# Patient Record
Sex: Female | Born: 1935
Health system: Southern US, Community
[De-identification: ages and names within clinical notes are randomized; demographics above are authoritative.]

## PROBLEM LIST (undated history)

## (undated) DIAGNOSIS — I2510 Atherosclerotic heart disease of native coronary artery without angina pectoris: Secondary | ICD-10-CM

## (undated) DIAGNOSIS — H189 Unspecified disorder of cornea: Secondary | ICD-10-CM

## (undated) DIAGNOSIS — I4819 Other persistent atrial fibrillation: Secondary | ICD-10-CM

## (undated) DIAGNOSIS — W19XXXA Unspecified fall, initial encounter: Secondary | ICD-10-CM

## (undated) DIAGNOSIS — I5032 Chronic diastolic (congestive) heart failure: Secondary | ICD-10-CM

## (undated) DIAGNOSIS — Z860100 Personal history of colon polyps, unspecified: Secondary | ICD-10-CM

## (undated) DIAGNOSIS — I509 Heart failure, unspecified: Secondary | ICD-10-CM

## (undated) DIAGNOSIS — Z8619 Personal history of other infectious and parasitic diseases: Secondary | ICD-10-CM

## (undated) DIAGNOSIS — I10 Essential (primary) hypertension: Secondary | ICD-10-CM

## (undated) DIAGNOSIS — E039 Hypothyroidism, unspecified: Secondary | ICD-10-CM

## (undated) DIAGNOSIS — K219 Gastro-esophageal reflux disease without esophagitis: Secondary | ICD-10-CM

## (undated) DIAGNOSIS — I4891 Unspecified atrial fibrillation: Secondary | ICD-10-CM

## (undated) DIAGNOSIS — I517 Cardiomegaly: Secondary | ICD-10-CM

## (undated) DIAGNOSIS — I499 Cardiac arrhythmia, unspecified: Secondary | ICD-10-CM

## (undated) DIAGNOSIS — E785 Hyperlipidemia, unspecified: Secondary | ICD-10-CM

## (undated) HISTORY — DX: Cardiomegaly: I51.7

## (undated) HISTORY — DX: Essential (primary) hypertension: I10

## (undated) HISTORY — DX: Atherosclerotic heart disease of native coronary artery without angina pectoris: I25.10

## (undated) HISTORY — DX: Unspecified disorder of cornea: H18.9

## (undated) HISTORY — PX: OTHER SURGICAL HISTORY: SHX169

## (undated) HISTORY — DX: Other persistent atrial fibrillation: I48.19

## (undated) HISTORY — DX: Personal history of colonic polyps: Z86.010

## (undated) HISTORY — DX: Hypothyroidism, unspecified: E03.9

## (undated) HISTORY — DX: Chronic diastolic (congestive) heart failure: I50.32

## (undated) HISTORY — DX: Cardiac arrhythmia, unspecified: I49.9

## (undated) HISTORY — DX: Personal history of colon polyps, unspecified: Z86.0100

## (undated) HISTORY — DX: Hyperlipidemia, unspecified: E78.5

## (undated) HISTORY — DX: Personal history of other infectious and parasitic diseases: Z86.19

## (undated) HISTORY — DX: Unspecified fall, initial encounter: W19.XXXA

## (undated) HISTORY — DX: Gastro-esophageal reflux disease without esophagitis: K21.9

## (undated) HISTORY — DX: Heart failure, unspecified: I50.9

## (undated) HISTORY — DX: Unspecified atrial fibrillation: I48.91

---

## 2009-12-13 HISTORY — PX: CARDIOVASCULAR STRESS TEST: SHX262

## 2011-12-16 ENCOUNTER — Ambulatory Visit: Payer: Self-pay | Admitting: Family Medicine

## 2012-01-06 ENCOUNTER — Other Ambulatory Visit: Payer: Self-pay | Admitting: Family Medicine

## 2012-01-06 ENCOUNTER — Encounter: Payer: Self-pay | Admitting: Family Medicine

## 2012-01-06 ENCOUNTER — Ambulatory Visit (INDEPENDENT_AMBULATORY_CARE_PROVIDER_SITE_OTHER): Payer: Medicare Other | Admitting: Family Medicine

## 2012-01-06 VITALS — BP 132/70 | HR 64 | Temp 97.6°F | Ht 62.0 in | Wt 229.0 lb

## 2012-01-06 DIAGNOSIS — I10 Essential (primary) hypertension: Secondary | ICD-10-CM

## 2012-01-06 DIAGNOSIS — E782 Mixed hyperlipidemia: Secondary | ICD-10-CM | POA: Insufficient documentation

## 2012-01-06 DIAGNOSIS — I4891 Unspecified atrial fibrillation: Secondary | ICD-10-CM

## 2012-01-06 DIAGNOSIS — Z5181 Encounter for therapeutic drug level monitoring: Secondary | ICD-10-CM

## 2012-01-06 DIAGNOSIS — E785 Hyperlipidemia, unspecified: Secondary | ICD-10-CM

## 2012-01-06 DIAGNOSIS — E669 Obesity, unspecified: Secondary | ICD-10-CM

## 2012-01-06 DIAGNOSIS — I482 Chronic atrial fibrillation, unspecified: Secondary | ICD-10-CM | POA: Insufficient documentation

## 2012-01-06 DIAGNOSIS — Z7901 Long term (current) use of anticoagulants: Secondary | ICD-10-CM

## 2012-01-06 LAB — POCT INR: INR: 1.5

## 2012-01-06 MED ORDER — METOPROLOL TARTRATE 50 MG PO TABS: 25.0000 mg | ORAL_TABLET | Freq: Two times a day (BID) | ORAL | Status: DC

## 2012-01-06 MED ORDER — LEVOTHYROXINE SODIUM 125 MCG PO TABS: 125.0000 ug | ORAL_TABLET | Freq: Every day | ORAL | Status: DC

## 2012-01-06 MED ORDER — LOSARTAN POTASSIUM 100 MG PO TABS: 100.0000 mg | ORAL_TABLET | Freq: Every day | ORAL | Status: DC

## 2012-01-06 MED ORDER — WARFARIN SODIUM 3 MG PO TABS: ORAL_TABLET | ORAL | Status: DC

## 2012-01-06 MED ORDER — POTASSIUM CHLORIDE ER 10 MEQ PO TBCR: 10.0000 meq | EXTENDED_RELEASE_TABLET | Freq: Every day | ORAL | Status: DC

## 2012-01-06 NOTE — Progress Notes (Signed)
AF, records requested, due for INR.  Needs to est care.  No CP, not sob.  Compliant with meds.    Hypertension:    Using medication without problems or lightheadedness: yes Chest pain with exertion:no Edema:no Short of breath:no Possibly intolerant of lisinopril, but patient can't elaborate fully.  Requesting records.    H/o colonoscopy.   She declines mammogram.  "Even if the found something, I wouldn't do anything about it."  Meds, vitals, and allergies reviewed.   PMH and SH reviewed  ROS: See HPI.  Otherwise negative.    GEN: nad, alert and oriented, overweight HEENT: mucous membranes moist NECK: supple w/o LA CV: rrr. PULM: ctab, no inc wob ABD: soft, +bs EXT: no edema SKIN: no acute rash

## 2012-01-06 NOTE — Assessment & Plan Note (Signed)
Continue as is, requesting records.   Statin intolerant per patient report.

## 2012-01-06 NOTE — Assessment & Plan Note (Signed)
Goal for gradual weight loss.

## 2012-01-06 NOTE — Assessment & Plan Note (Signed)
Continue as is, requesting records.

## 2012-01-06 NOTE — Assessment & Plan Note (Signed)
Continue as is, requesting records.   Refer to cards.  Hope to get records before cards eval. No CP.

## 2012-01-06 NOTE — Patient Instructions (Addendum)
Don't change your meds for now.   Go to the lab on the way out.  See Shirlee Limerick about your referral before you leave today. Take care.   OV this summer.

## 2012-01-17 ENCOUNTER — Encounter: Payer: Self-pay | Admitting: Family Medicine

## 2012-01-17 DIAGNOSIS — I517 Cardiomegaly: Secondary | ICD-10-CM | POA: Insufficient documentation

## 2012-01-20 ENCOUNTER — Ambulatory Visit (INDEPENDENT_AMBULATORY_CARE_PROVIDER_SITE_OTHER): Payer: Medicare Other | Admitting: Family Medicine

## 2012-01-20 DIAGNOSIS — Z7901 Long term (current) use of anticoagulants: Secondary | ICD-10-CM

## 2012-01-20 DIAGNOSIS — Z5181 Encounter for therapeutic drug level monitoring: Secondary | ICD-10-CM

## 2012-01-20 DIAGNOSIS — I4891 Unspecified atrial fibrillation: Secondary | ICD-10-CM

## 2012-01-20 NOTE — Patient Instructions (Signed)
7.5 mg daily except 10.5 mg 2 days a week ( T/TH?) check in 2 weeks

## 2012-01-24 ENCOUNTER — Encounter: Payer: Self-pay | Admitting: Family Medicine

## 2012-02-04 ENCOUNTER — Ambulatory Visit: Payer: Medicare Other

## 2012-02-07 ENCOUNTER — Ambulatory Visit (INDEPENDENT_AMBULATORY_CARE_PROVIDER_SITE_OTHER): Payer: Medicare Other | Admitting: Family Medicine

## 2012-02-07 DIAGNOSIS — I4891 Unspecified atrial fibrillation: Secondary | ICD-10-CM

## 2012-02-07 DIAGNOSIS — Z5181 Encounter for therapeutic drug level monitoring: Secondary | ICD-10-CM

## 2012-02-07 DIAGNOSIS — Z7901 Long term (current) use of anticoagulants: Secondary | ICD-10-CM

## 2012-02-07 LAB — POCT INR: INR: 4.1

## 2012-02-07 NOTE — Patient Instructions (Signed)
Hold today and tomorrow then resume 7.5 mg daily 10.5 mg Sun Wed, check FRIDAY

## 2012-02-10 ENCOUNTER — Ambulatory Visit: Payer: Medicare Other | Admitting: Cardiovascular Disease

## 2012-02-11 ENCOUNTER — Ambulatory Visit (INDEPENDENT_AMBULATORY_CARE_PROVIDER_SITE_OTHER): Payer: Medicare Other | Admitting: Family Medicine

## 2012-02-11 DIAGNOSIS — I4891 Unspecified atrial fibrillation: Secondary | ICD-10-CM

## 2012-02-11 DIAGNOSIS — Z7901 Long term (current) use of anticoagulants: Secondary | ICD-10-CM

## 2012-02-11 DIAGNOSIS — Z5181 Encounter for therapeutic drug level monitoring: Secondary | ICD-10-CM

## 2012-02-11 LAB — POCT INR: INR: 1.6

## 2012-02-18 ENCOUNTER — Encounter: Payer: Self-pay | Admitting: *Deleted

## 2012-02-18 ENCOUNTER — Ambulatory Visit: Payer: Medicare Other

## 2012-02-21 ENCOUNTER — Ambulatory Visit: Payer: Medicare Other | Admitting: Cardiovascular Disease

## 2012-02-24 ENCOUNTER — Ambulatory Visit: Payer: Medicare Other

## 2012-02-25 ENCOUNTER — Ambulatory Visit (INDEPENDENT_AMBULATORY_CARE_PROVIDER_SITE_OTHER): Payer: Medicare Other | Admitting: Family Medicine

## 2012-02-25 DIAGNOSIS — I4891 Unspecified atrial fibrillation: Secondary | ICD-10-CM

## 2012-02-25 DIAGNOSIS — Z7901 Long term (current) use of anticoagulants: Secondary | ICD-10-CM

## 2012-02-25 DIAGNOSIS — Z5181 Encounter for therapeutic drug level monitoring: Secondary | ICD-10-CM

## 2012-02-25 LAB — POCT INR: INR: 3

## 2012-02-25 NOTE — Patient Instructions (Signed)
Start 7.5 mg daily except 9 mg 2 days, recheck 10 days

## 2012-03-06 ENCOUNTER — Ambulatory Visit (INDEPENDENT_AMBULATORY_CARE_PROVIDER_SITE_OTHER): Payer: Medicare Other | Admitting: Family Medicine

## 2012-03-06 DIAGNOSIS — I4891 Unspecified atrial fibrillation: Secondary | ICD-10-CM

## 2012-03-06 DIAGNOSIS — Z7901 Long term (current) use of anticoagulants: Secondary | ICD-10-CM

## 2012-03-06 DIAGNOSIS — Z5181 Encounter for therapeutic drug level monitoring: Secondary | ICD-10-CM

## 2012-03-06 NOTE — Patient Instructions (Signed)
Continue current dose, check in 4 weeks  

## 2012-03-24 ENCOUNTER — Ambulatory Visit (INDEPENDENT_AMBULATORY_CARE_PROVIDER_SITE_OTHER): Payer: Medicare Other | Admitting: Cardiovascular Disease

## 2012-03-24 ENCOUNTER — Encounter: Payer: Self-pay | Admitting: Cardiovascular Disease

## 2012-03-24 VITALS — BP 142/78 | HR 65 | Resp 16 | Ht 62.0 in | Wt 232.0 lb

## 2012-03-24 DIAGNOSIS — I10 Essential (primary) hypertension: Secondary | ICD-10-CM

## 2012-03-24 DIAGNOSIS — I517 Cardiomegaly: Secondary | ICD-10-CM

## 2012-03-24 DIAGNOSIS — I4891 Unspecified atrial fibrillation: Secondary | ICD-10-CM

## 2012-03-24 DIAGNOSIS — E785 Hyperlipidemia, unspecified: Secondary | ICD-10-CM

## 2012-03-24 DIAGNOSIS — E669 Obesity, unspecified: Secondary | ICD-10-CM

## 2012-03-24 NOTE — Assessment & Plan Note (Signed)
Mild LVH on echocardiogram from 2007. No symptoms of shortness of breath or fluid retention.

## 2012-03-24 NOTE — Assessment & Plan Note (Signed)
We have encouraged continued exercise, careful diet management in an effort to lose weight. 

## 2012-03-24 NOTE — Progress Notes (Signed)
Patient ID: Julie Phillips, female    DOB: 12/24/1931, 77 y.o.   MRN: [id number]  HPI Comments: Julie Phillips  is a pleasant 78 year old woman with remote history of atrial fibrillation in 2007, low potassium by her report, who has been on chronic warfarin, history of hypertension who presents by referral establish care in our office .  She reports that overall she has been feeling well. She has not had any recent episodes of atrial fibrillation for at least one year. When she has arrhythmia, she is symptomatic. She has rare episodes of irregular heartbeat that does not last very long, again not for a prolonged period of time. The main episode was 2007 and she converted on her own by  her report.   At that time she had an echocardiogram that showed mild MR and TR, normal LV systolic function  Stress test was normal showing no ischemia with normal ejection fraction . Carotid ultrasound showed minimal chronic plaquing .  No recent blood work available . She is relatively active, walks with a cane and walker for balance. No significant chest pain or shortness of breath with exertion .she did have a fall recently when she fell over her dog. She did not hurt herself .  EKG shows normal sinus rhythm with rate 65 beats per minute, no significant ST or T wave changes    Outpatient Encounter Prescriptions as of 03/24/2012  Medication Sig Dispense Refill  . aspirin 81 MG tablet Take 81 mg by mouth daily.      . Calcium Carbonate-Vitamin D (CALTRATE 600+D PO) Take by mouth daily.      . Cobalamine Combinations (VITAMIN B12-FOLIC ACID) 500-400 MCG TABS Take 1 tablet by mouth daily.      Marland Kitchen desoximetasone (TOPICORT) 0.25 % cream Apply topically 2 (two) times daily.      Marland Kitchen levothyroxine (SYNTHROID, LEVOTHROID) 125 MCG tablet Take 1 tablet (125 mcg total) by mouth daily.  30 tablet  0  . losartan (COZAAR) 100 MG tablet Take 1 tablet (100 mg total) by mouth daily.  30 tablet  0  . Lutein-Zeaxanthin 15-0.7  MG CAPS Take 2 capsules by mouth daily.      . metoprolol (LOPRESSOR) 50 MG tablet Take 0.5 tablets (25 mg total) by mouth 2 (two) times daily.  30 tablet  0  . potassium chloride (K-DUR) 10 MEQ tablet Take 1 tablet (10 mEq total) by mouth daily.  30 tablet  0  . ranitidine (ZANTAC) 150 MG capsule Take 150 mg by mouth 2 (two) times daily.      Marland Kitchen warfarin (COUMADIN) 3 MG tablet 2.5 tabs x5 days a week and 3.5 tabs x2 days a week or as directed.  100 tablet  0   Review of Systems  Constitutional: Negative.   HENT: Negative.   Eyes: Negative.   Respiratory: Negative.   Cardiovascular: Negative.   Gastrointestinal: Negative.   Musculoskeletal: Positive for gait problem.  Skin: Negative.   Neurological: Negative.   Hematological: Negative.   Psychiatric/Behavioral: Negative.   All other systems reviewed and are negative.    BP 142/78  Pulse 65  Resp 16  Ht 5\' 2"  (1.575 m)  Wt 232 lb (105.235 kg)  BMI 42.43 kg/m2  Physical Exam  Nursing note and vitals reviewed. Constitutional: She is oriented to person, place, and time. She appears well-developed and well-nourished.       Obese  HENT:  Head: Normocephalic.  Nose: Nose normal.  Mouth/Throat: Oropharynx is  clear and moist.  Eyes: Conjunctivae are normal. Pupils are equal, round, and reactive to light.  Neck: Normal range of motion. Neck supple. No JVD present.  Cardiovascular: Normal rate, regular rhythm, S1 normal, S2 normal, normal heart sounds and intact distal pulses.  Exam reveals no gallop and no friction rub.   No murmur heard. Pulmonary/Chest: Effort normal and breath sounds normal. No respiratory distress. She has no wheezes. She has no rales. She exhibits no tenderness.  Abdominal: Soft. Bowel sounds are normal. She exhibits no distension. There is no tenderness.  Musculoskeletal: Normal range of motion. She exhibits no edema and no tenderness.  Lymphadenopathy:    She has no cervical adenopathy.  Neurological: She is  alert and oriented to person, place, and time. Coordination normal.  Skin: Skin is warm and dry. Rash noted. No erythema.       Erythematous rash of the right lower extremity below the knee  Psychiatric: She has a normal mood and affect. Her behavior is normal. Judgment and thought content normal.         Assessment and Plan

## 2012-03-24 NOTE — Assessment & Plan Note (Addendum)
History of hyperlipidemia. No labs available to review at this time. Minimal carotid plaquing in 2007.

## 2012-03-24 NOTE — Assessment & Plan Note (Signed)
Blood pressure by her report has been well controlled at home. We have not made any medication changes today and we will monitor her numbers.

## 2012-03-24 NOTE — Assessment & Plan Note (Signed)
Remote episode of atrial fibrillation in 2007. Nothing documented since that time and she has been relatively asymptomatic. We did discuss holding her warfarin. This would likely be okay. She is high risk given recent falls. We did discuss cutting her warfarin in half on a daily basis with no INR check as well. She will continue on the warfarin for now and discuss this with Dr. Para March.

## 2012-03-24 NOTE — Patient Instructions (Signed)
You are doing well. No medication changes were made.  Please call us if you have new issues that need to be addressed before your next appt.  Your physician wants you to follow-up in: 12 months.  You will receive a reminder letter in the mail two months in advance. If you don't receive a letter, please call our office to schedule the follow-up appointment.  For your leg, keep it cool Try Sarna cream (generic) or calamine  for itching

## 2012-04-03 ENCOUNTER — Ambulatory Visit (INDEPENDENT_AMBULATORY_CARE_PROVIDER_SITE_OTHER): Payer: Medicare Other | Admitting: Family Medicine

## 2012-04-03 DIAGNOSIS — Z5181 Encounter for therapeutic drug level monitoring: Secondary | ICD-10-CM

## 2012-04-03 DIAGNOSIS — Z7901 Long term (current) use of anticoagulants: Secondary | ICD-10-CM

## 2012-04-03 DIAGNOSIS — I4891 Unspecified atrial fibrillation: Secondary | ICD-10-CM

## 2012-04-03 LAB — POCT INR: INR: 1.5

## 2012-04-03 NOTE — Patient Instructions (Signed)
(  take 9 mg next two days) sees Dr Vladimir Crofts will recheck then

## 2012-04-04 ENCOUNTER — Ambulatory Visit: Payer: Medicare Other | Admitting: Family Medicine

## 2012-04-04 ENCOUNTER — Ambulatory Visit: Payer: Medicare Other

## 2012-04-11 ENCOUNTER — Encounter: Payer: Self-pay | Admitting: Family Medicine

## 2012-04-11 ENCOUNTER — Ambulatory Visit (INDEPENDENT_AMBULATORY_CARE_PROVIDER_SITE_OTHER): Payer: Medicare Other | Admitting: Family Medicine

## 2012-04-11 VITALS — BP 152/68 | HR 63 | Temp 97.9°F | Wt 235.0 lb

## 2012-04-11 DIAGNOSIS — R21 Rash and other nonspecific skin eruption: Secondary | ICD-10-CM | POA: Insufficient documentation

## 2012-04-11 DIAGNOSIS — E785 Hyperlipidemia, unspecified: Secondary | ICD-10-CM

## 2012-04-11 DIAGNOSIS — I4891 Unspecified atrial fibrillation: Secondary | ICD-10-CM

## 2012-04-11 DIAGNOSIS — Z5181 Encounter for therapeutic drug level monitoring: Secondary | ICD-10-CM

## 2012-04-11 DIAGNOSIS — Z7901 Long term (current) use of anticoagulants: Secondary | ICD-10-CM

## 2012-04-11 DIAGNOSIS — E669 Obesity, unspecified: Secondary | ICD-10-CM

## 2012-04-11 DIAGNOSIS — E039 Hypothyroidism, unspecified: Secondary | ICD-10-CM | POA: Insufficient documentation

## 2012-04-11 DIAGNOSIS — I10 Essential (primary) hypertension: Secondary | ICD-10-CM

## 2012-04-11 LAB — POCT INR: INR: 2.6

## 2012-04-11 LAB — LIPID PANEL
Cholesterol: 212 mg/dL — ABNORMAL HIGH (ref 0–200)
HDL: 43.4 mg/dL (ref >39.00)
VLDL: 31.6 mg/dL (ref 0.0–40.0)

## 2012-04-11 LAB — COMPREHENSIVE METABOLIC PANEL
AST: 18 U/L (ref 0–37)
Alkaline Phosphatase: 100 U/L (ref 39–117)
BUN: 14 mg/dL (ref 6–23)
Creatinine, Ser: 0.7 mg/dL (ref 0.4–1.2)
Potassium: 4.1 mEq/L (ref 3.5–5.1)

## 2012-04-11 LAB — LDL CHOLESTEROL, DIRECT: Direct LDL: 152.5 mg/dL

## 2012-04-11 MED ORDER — WARFARIN SODIUM 3 MG PO TABS: ORAL_TABLET | ORAL | Status: DC

## 2012-04-11 MED ORDER — DESOXIMETASONE 0.25 % EX CREA: TOPICAL_CREAM | Freq: Two times a day (BID) | CUTANEOUS | Status: DC

## 2012-04-11 NOTE — Patient Instructions (Signed)
Per Dr Para March, patients dose has been reeducated due to high fall risk. 3 mg daily, 6 mg MWF check only once a month, soon he will only want INR's done every 3 months

## 2012-04-11 NOTE — Patient Instructions (Addendum)
See Shirlee Limerick about your referral before you leave today. Go to the lab on the way out.  We'll contact you with your lab report. Cut back your coumadin as directed.   Recheck INR monthly for 3 months and then likely every 3 months.  Come back and see me in 3 months.

## 2012-04-11 NOTE — Assessment & Plan Note (Signed)
Recheck TSH today. See notes on labs. 

## 2012-04-11 NOTE — Assessment & Plan Note (Signed)
D/w pt about weight loss.  

## 2012-04-11 NOTE — Progress Notes (Signed)
H/o AF and on coumadin.  She hasn't had an episode of known AF in at least 1 year.  She has had falls and walks with a cane.  3 options are to continue ASA only, cut the coumadin in half or continue as is.  All were discussed in terms or risk and benefits.  She wants to cut back to 1/2 dose of coumadin.  She is taking 3mg  tabs, 2.5 tabs x5 days a week and 3.5 tabs x2 days a week.  She is aware of all options and the concern for risk of bleeding with falls.   Hypertension:    Using medication without problems or lightheadedness: yes Chest pain with exertion:no Edema:no Short of breath:no  Hypothyroid.  Due for labs.  No ADE, no neck mass, no dysphagia.    4mm flat dark purple lesion on L upper arm, present for years per patient w/o change known.    Blanching red rash on scalp, abd, and shins. Controlled with topicort. Asking for derm referral.   PMH and SH reviewed  ROS: See HPI, otherwise noncontributory.  Meds, vitals, and allergies reviewed.   nad ncat Mmm rrr ctab abd soft Ext w/o edema 4mm flat dark purple lesion on L upper arm Blanching red rash on scalp, abd, and shins.

## 2012-04-11 NOTE — Assessment & Plan Note (Signed)
Continue topical tx, d/w pt about weight loss as this could contribute with pannus overlap, refer to derm per patient request.

## 2012-04-11 NOTE — Assessment & Plan Note (Signed)
She hasn't had an episode of known AF in at least 1 year. She has had falls and walks with a cane. 3 options are to continue ASA only, cut the coumadin in half or continue as is. All were discussed in terms or risk and benefits. She wants to cut back to 1/2 dose of coumadin. She is taking 3mg  tabs, 2.5 tabs x5 days a week and 3.5 tabs x2 days a week. Will change to 1 tab a day, with an extra tab on MWF.  She is aware of all options and the concern for risk of bleeding with falls.  Will recheck monthly INR x3 months starting in 03/2012 and then likely check q61months.

## 2012-04-11 NOTE — Assessment & Plan Note (Signed)
Continue current meds 

## 2012-04-11 NOTE — Assessment & Plan Note (Signed)
Statin intolerant, but will check today.

## 2012-05-10 ENCOUNTER — Ambulatory Visit (INDEPENDENT_AMBULATORY_CARE_PROVIDER_SITE_OTHER): Payer: Medicare Other | Admitting: Family Medicine

## 2012-05-10 DIAGNOSIS — I4891 Unspecified atrial fibrillation: Secondary | ICD-10-CM

## 2012-05-10 DIAGNOSIS — Z7901 Long term (current) use of anticoagulants: Secondary | ICD-10-CM

## 2012-05-10 DIAGNOSIS — Z5181 Encounter for therapeutic drug level monitoring: Secondary | ICD-10-CM

## 2012-05-10 NOTE — Patient Instructions (Addendum)
Continue current dose, check in 4 weeks counciled the  patient on the importance of not missing doses.

## 2012-06-06 ENCOUNTER — Emergency Department: Payer: Self-pay | Admitting: *Deleted

## 2012-06-06 ENCOUNTER — Ambulatory Visit (INDEPENDENT_AMBULATORY_CARE_PROVIDER_SITE_OTHER): Payer: Medicare Other | Admitting: Family Medicine

## 2012-06-06 DIAGNOSIS — Z5181 Encounter for therapeutic drug level monitoring: Secondary | ICD-10-CM

## 2012-06-06 DIAGNOSIS — Z7901 Long term (current) use of anticoagulants: Secondary | ICD-10-CM

## 2012-06-06 DIAGNOSIS — I4891 Unspecified atrial fibrillation: Secondary | ICD-10-CM

## 2012-06-06 NOTE — Patient Instructions (Addendum)
The patient has put herself back on 7.5 mg daily, except 10.5 mg x 2 weekly, I spoke with Dr Para March, he ask that the patient schedule with him to discuss this issue. Patient scheduled an appt

## 2012-06-13 ENCOUNTER — Encounter: Payer: Self-pay | Admitting: Family Medicine

## 2012-06-13 ENCOUNTER — Ambulatory Visit (INDEPENDENT_AMBULATORY_CARE_PROVIDER_SITE_OTHER): Payer: Medicare Other | Admitting: Family Medicine

## 2012-06-13 VITALS — BP 130/74 | HR 82 | Temp 98.1°F | Wt 230.0 lb

## 2012-06-13 DIAGNOSIS — IMO0002 Reserved for concepts with insufficient information to code with codable children: Secondary | ICD-10-CM

## 2012-06-13 DIAGNOSIS — I4891 Unspecified atrial fibrillation: Secondary | ICD-10-CM

## 2012-06-13 DIAGNOSIS — Z4802 Encounter for removal of sutures: Secondary | ICD-10-CM

## 2012-06-13 NOTE — Assessment & Plan Note (Signed)
6 sutures removed easily, tolerated well, covered with bandaid, good tissue approximation.

## 2012-06-13 NOTE — Progress Notes (Signed)
Fell 1 week ago.  No LOC.  Seen at [facility].  Sutures on L forehead.  Bruising on L arm.  Due for sutures to come out today.    We had talked about coumadin dosing prev.  She had started back on full dose coumadin without talking to me.  I advised her not to do this again w/o MD discussion or it could lead to discharge.  We again talked about options, ASA, 1/2 dose coumadin or full dose coumadin.  She is a fall risk.  She understood risk and benefits with all options.  She would like to cut back to 1/2 dose coumadin and check INR q3 months.    Meds, vitals, and allergies reviewed.   ROS: See HPI.  Otherwise, noncontributory.  nad ncat except for L upper forehead with 6 sutures removed, good tissue approximation remains.  Covered with bandaid after suture removal. No retained FB noted with magnification. Bruise noted on L arm

## 2012-06-13 NOTE — Assessment & Plan Note (Signed)
D/w pt about risk and benefits.  Will restart reduced dose of coumadin.  Pulse regular today.  She agrees with plan.

## 2012-06-13 NOTE — Patient Instructions (Signed)
Coumadin- low dose schedule- 1 (3mg ) tab a day, with an extra tab on MWF. Totally of 10 tabs = 30mg  in 1 week.  Recheck INR in the lab every 3 months.   Take care.

## 2012-07-04 ENCOUNTER — Ambulatory Visit: Payer: Medicare Other

## 2012-07-11 ENCOUNTER — Ambulatory Visit: Payer: Medicare Other | Admitting: Family Medicine

## 2012-07-20 ENCOUNTER — Ambulatory Visit (INDEPENDENT_AMBULATORY_CARE_PROVIDER_SITE_OTHER): Payer: Medicare Other | Admitting: Family Medicine

## 2012-07-20 ENCOUNTER — Telehealth: Payer: Self-pay | Admitting: *Deleted

## 2012-07-20 ENCOUNTER — Encounter: Payer: Self-pay | Admitting: Family Medicine

## 2012-07-20 VITALS — BP 128/70 | HR 94 | Temp 98.3°F | Wt 227.0 lb

## 2012-07-20 DIAGNOSIS — Z5181 Encounter for therapeutic drug level monitoring: Secondary | ICD-10-CM

## 2012-07-20 DIAGNOSIS — Z7901 Long term (current) use of anticoagulants: Secondary | ICD-10-CM

## 2012-07-20 DIAGNOSIS — I4891 Unspecified atrial fibrillation: Secondary | ICD-10-CM

## 2012-07-20 DIAGNOSIS — M25569 Pain in unspecified knee: Secondary | ICD-10-CM | POA: Insufficient documentation

## 2012-07-20 MED ORDER — METOPROLOL TARTRATE 50 MG PO TABS: 50.0000 mg | ORAL_TABLET | Freq: Two times a day (BID) | ORAL | Status: DC

## 2012-07-20 NOTE — Assessment & Plan Note (Signed)
Minimal sx, will follow clinically.  If not improving, can image at that point.  Would allow time for improvement in meantime.  Pt agrees.

## 2012-07-20 NOTE — Assessment & Plan Note (Signed)
INR low as expected.  Okay to continue as is with recheck INR in 3 months.

## 2012-07-20 NOTE — Progress Notes (Signed)
Still with some R knee pain, worse with weight gain. No recent fall but she had twisted her knee about 1 week ago. Pain got better after 2-3 days with tylenol.  Able to bear weight now.  Wearing compression stocking to help with swelling.  No locking or giving way in the knee.    Coumadin was reduced.  No heart racing.  No CP, sob.  We had talked about this at length prev. See prev noted.   Meds, vitals, and allergies reviewed.   ROS: See HPI.  Otherwise, noncontributory.  nad ncat rrr ctab Knee exam: Knee feels sold on testing, w/o laxity.  Mildly ttp on R lateral knee but not on medial side.  No laxity or clicking.  Able to weight bear.

## 2012-07-20 NOTE — Telephone Encounter (Signed)
Ms. Hoopes told me that on her Metoprolol, she is taking a 100 mg tablet once a day instead of the 50 mg tablet twice daily.  She says the mail order pharmacy sent her the 100 mg tabs and she cannot easily split them.  I suggested that she ask you about this at her OV but she says she forgot.  Is it okay for her to continue the 100 mg tabs once daily or should the Rx be sent in as 50 mg twice daily?

## 2012-07-20 NOTE — Patient Instructions (Addendum)
Plan on continuing your coumadin as is and recheck in the lab with Terri in 3 months.   Your knee should gradually improve.  Let me know if you continue to have pain.

## 2012-07-20 NOTE — Telephone Encounter (Signed)
She can try a pill splitter to take 1/2 tab bid or she can continue with once a day dosing of the 100mg  tab.

## 2012-07-20 NOTE — Telephone Encounter (Signed)
Patient advised.

## 2012-08-30 ENCOUNTER — Encounter: Payer: Self-pay | Admitting: Family Medicine

## 2012-08-30 ENCOUNTER — Ambulatory Visit (INDEPENDENT_AMBULATORY_CARE_PROVIDER_SITE_OTHER): Payer: Medicare Other | Admitting: Family Medicine

## 2012-08-30 VITALS — BP 130/76 | HR 86 | Temp 98.2°F | Ht 62.0 in | Wt 223.2 lb

## 2012-08-30 DIAGNOSIS — M25569 Pain in unspecified knee: Secondary | ICD-10-CM

## 2012-08-30 DIAGNOSIS — M1711 Unilateral primary osteoarthritis, right knee: Secondary | ICD-10-CM

## 2012-08-30 DIAGNOSIS — M25561 Pain in right knee: Secondary | ICD-10-CM

## 2012-08-30 NOTE — Progress Notes (Signed)
Nature conservation officer at [facility] [address] [place] [address] Phone: [phone] Fax: [phone]  Date:  08/30/2012   Name:  Julie Phillips   DOB:  09-15-1932   MRN:  [id number] Gender: female Age: 78 y.o.  PCP:  Julie Givens, MD    Chief Complaint: Knee Pain   History of Present Illness:  Julie Phillips is a 78 y.o. pleasant patient who presents with the following:  06/08/2012, fell and went to [facility] ER: Coming down from dinner, and stepped on some debris, and knocked off of her balance and hit her head and had a lot of stiches, and hit L and R knee. Scrape all on the L, but now the R knee is hurting a lot.   With walking with and in the store. Will lose some control. A she described a sensation where her right knee will buckle on her, and she will lose her balance. She does have some medial and lateral joint line pain. This is on both the right and left knees.  No x-rays are available for my review, but she did have some x-rays at [facility], no fracture was noted, but no comment was made about the joint space preservation.  Patient Active Problem List  Diagnosis  . AF (atrial fibrillation)  . Obesity  . HTN (hypertension)  . HLD (hyperlipidemia)  . LVH (left ventricular hypertrophy)  . Rash  . Hypothyroid  . Dressing change/suture removal  . Knee pain    Past Medical History  Diagnosis Date  . GERD (gastroesophageal reflux disease)   . Hx of colonic polyps   . History of chicken pox   . Hyperlipidemia   . Hypertension   . AF (atrial fibrillation)   . LVH (left ventricular hypertrophy)     Past Surgical History  Procedure Date  . Cardiovascular stress test 2011    normal     History  Substance Use Topics  . Smoking status: Former Smoker -- 2.0 packs/day for 20 years    Types: Cigarettes    Quit date: 12/14/1971  . Smokeless tobacco: Never Used  . Alcohol Use: No     occasionally    Family History  Problem Relation Age of Onset  .  Heart disease Mother   . Heart disease Father   . Breast cancer Sister   . Breast cancer Daughter     Allergies  Allergen Reactions  . Statins     Myalgias  . Wheat     Throat swelling  . Amlodipine     hematuria  . Other     Unrecalled BP med- possibly lisinopril- intolerant, but patient couldn't elaborate    Medication list has been reviewed and updated.  Outpatient Prescriptions Prior to Visit  Medication Sig Dispense Refill  . aspirin 81 MG tablet Take 81 mg by mouth daily as needed.       . Calcium Carbonate-Vitamin D (CALTRATE 600+D PO) Take by mouth daily.      . Cobalamine Combinations (VITAMIN B12-FOLIC ACID) 500-400 MCG TABS Take 1 tablet by mouth daily.      Marland Kitchen desoximetasone (TOPICORT) 0.25 % cream Apply topically 2 (two) times daily.  30 g  2  . levothyroxine (SYNTHROID, LEVOTHROID) 125 MCG tablet Take 1 tablet (125 mcg total) by mouth daily.  30 tablet  0  . losartan (COZAAR) 100 MG tablet Take 1 tablet (100 mg total) by mouth daily.  30 tablet  0  . Lutein-Zeaxanthin 15-0.7  MG CAPS Take 2 capsules by mouth daily.      . metoprolol (LOPRESSOR) 50 MG tablet Take 1 tablet (50 mg total) by mouth 2 (two) times daily.      . potassium chloride (K-DUR) 10 MEQ tablet Take 1 tablet (10 mEq total) by mouth daily.  30 tablet  0  . ranitidine (ZANTAC) 150 MG capsule Take 150 mg by mouth 2 (two) times daily.      Marland Kitchen warfarin (COUMADIN) 3 MG tablet 1 tab every day and then 1 extra tab on MWF (10 tabs per week)        Review of Systems:   GEN: No fevers, chills. Nontoxic. Primarily MSK c/o today. MSK: Detailed in the HPI GI: tolerating PO intake without difficulty Neuro: No numbness, parasthesias, or tingling associated. Otherwise the pertinent positives of the ROS are noted above.    Physical Examination: Filed Vitals:   08/30/12 0901  BP: 130/76  Pulse: 86  Temp: 98.2 F (36.8 C)   Filed Vitals:   08/30/12 0901  Height: 5\' 2"  (1.575 m)  Weight: 223 lb 4 oz  (101.266 kg)   Body mass index is 40.83 kg/(m^2). Ideal Body Weight: Weight in (lb) to have BMI = 25: 136.4    GEN: WDWN, NAD, Non-toxic, Alert & Oriented x 3 HEENT: Atraumatic, Normocephalic.  Ears and Nose: No external deformity. EXTR: No clubbing/cyanosis/edema NEURO: antalgia PSYCH: Normally interactive. Conversant. Not depressed or anxious appearing.  Calm demeanor.   Knee:  R Gait: Normal heel toe pattern - but anntalgia ROM: 0-112 Effusion: mild Echymosis or edema: none Patellar tendon NT Painful PLICA: neg Patellar grind: pos Medial and lateral patellar facet loading: painful medial and lateral joint lines: medial > lateral pain Mcmurray's pain Flexion-pinch + Varus and valgus stress: stable Lachman: neg Ant and Post drawer: neg Hip abduction, IR, ER: WNL Hip flexion str: 5/5 Hip abd: 5/5 Quad: 5/5 VMO atrophy:mild Hamstring concentric and eccentric: 5/5   Assessment and Plan:  1. Arthritis of right knee    2. Knee pain, bilateral  DG Knee 1-2 Views Left, DG Knee 1-2 Views Right, DG Knee Bilateral Standing AP   Right osteoarthritic flare versus meniscal pathology. Certainly she has a good mechanism for a meniscal injury, but age 51, conservative treatment should be thoroughly followed through on. She already has a compression brace that is helping somewhat the swelling.  On warfarin. No NSAIDS. Inject knee, continue brace. F/u if not better in 4 weeks  Knee Aspiration and Injection, R Patient verbally consented; risks, benefits, and alternatives explained including possible infection. Patient prepped with Chloraprep. Ethyl chloride for anesthesia. 10 cc of 1% Lidocaine used in wheal then injected Subcutaneous fashion with 27 gauge needle on lateral approach. Under sterilne conditions, 18 gauge needle used via lateral approach to aspirate 30 cc of fluid tinged synovial fluid. Then 8 cc of Lidocaine 1% and 2 cc of Depo-Medrol 40 mg injected. Tolerated well,  decreased pain, no complications.   Orders Today:  No orders of the defined types were placed in this encounter.    Updated Medication List: (Includes new medications, updates to list, dose adjustments) Outpatient Encounter Prescriptions as of 08/30/2012  Medication Sig Dispense Refill  . aspirin 81 MG tablet Take 81 mg by mouth daily as needed.       . Calcium Carbonate-Vitamin D (CALTRATE 600+D PO) Take by mouth daily.      . Cobalamine Combinations (VITAMIN B12-FOLIC ACID) 500-400 MCG TABS Take 1 tablet  by mouth daily.      Marland Kitchen desoximetasone (TOPICORT) 0.25 % cream Apply topically 2 (two) times daily.  30 g  2  . levothyroxine (SYNTHROID, LEVOTHROID) 125 MCG tablet Take 1 tablet (125 mcg total) by mouth daily.  30 tablet  0  . losartan (COZAAR) 100 MG tablet Take 1 tablet (100 mg total) by mouth daily.  30 tablet  0  . Lutein-Zeaxanthin 15-0.7 MG CAPS Take 2 capsules by mouth daily.      . metoprolol (LOPRESSOR) 50 MG tablet Take 1 tablet (50 mg total) by mouth 2 (two) times daily.      . potassium chloride (K-DUR) 10 MEQ tablet Take 1 tablet (10 mEq total) by mouth daily.  30 tablet  0  . ranitidine (ZANTAC) 150 MG capsule Take 150 mg by mouth 2 (two) times daily.      Marland Kitchen warfarin (COUMADIN) 3 MG tablet 1 tab every day and then 1 extra tab on MWF (10 tabs per week)        Medications Discontinued: There are no discontinued medications.   Hannah Beat, MD

## 2012-10-04 ENCOUNTER — Ambulatory Visit (INDEPENDENT_AMBULATORY_CARE_PROVIDER_SITE_OTHER): Payer: Medicare Other | Admitting: Family Medicine

## 2012-10-04 ENCOUNTER — Ambulatory Visit (INDEPENDENT_AMBULATORY_CARE_PROVIDER_SITE_OTHER): Payer: Medicare Other

## 2012-10-04 DIAGNOSIS — Z7901 Long term (current) use of anticoagulants: Secondary | ICD-10-CM

## 2012-10-04 DIAGNOSIS — I4891 Unspecified atrial fibrillation: Secondary | ICD-10-CM

## 2012-10-04 DIAGNOSIS — Z23 Encounter for immunization: Secondary | ICD-10-CM

## 2012-10-04 DIAGNOSIS — Z5181 Encounter for therapeutic drug level monitoring: Secondary | ICD-10-CM

## 2012-10-04 LAB — POCT INR: INR: 1.1

## 2012-10-04 NOTE — Patient Instructions (Signed)
Per Dr Para March, patients dose has been reduced due to high fall risk. 3 mg daily, 6 mg Sun/Tu check only once a month, soon he will only want INR's done every 3 months

## 2012-10-04 NOTE — Progress Notes (Signed)
Patient has continued on lower dose of coumadin and is reportedly doing well.  Feels well; has been in PT.  Will continue with q95month checks.

## 2012-10-20 ENCOUNTER — Ambulatory Visit: Payer: Medicare Other

## 2012-12-14 ENCOUNTER — Other Ambulatory Visit: Payer: Self-pay

## 2012-12-14 NOTE — Telephone Encounter (Signed)
Pt request refill levothyroxine,losartan,potassium,warfarin, and metoprolol Primemail. Pt taking metoprolol 50mg  1/2 tab twice a day(on med list taking 1 tab twice a day).Please advise. Pt also not sure when next appt with Dr Para March. Call pt back with next appt.

## 2012-12-15 MED ORDER — POTASSIUM CHLORIDE ER 10 MEQ PO TBCR: 10.0000 meq | EXTENDED_RELEASE_TABLET | Freq: Every day | ORAL | Status: DC

## 2012-12-15 MED ORDER — METOPROLOL TARTRATE 50 MG PO TABS: 25.0000 mg | ORAL_TABLET | Freq: Two times a day (BID) | ORAL | Status: DC

## 2012-12-15 MED ORDER — WARFARIN SODIUM 3 MG PO TABS: ORAL_TABLET | ORAL | Status: DC

## 2012-12-15 MED ORDER — LOSARTAN POTASSIUM 100 MG PO TABS: 100.0000 mg | ORAL_TABLET | Freq: Every day | ORAL | Status: DC

## 2012-12-15 MED ORDER — LEVOTHYROXINE SODIUM 125 MCG PO TABS: 125.0000 ug | ORAL_TABLET | Freq: Every day | ORAL | Status: DC

## 2012-12-15 NOTE — Telephone Encounter (Signed)
Needs CPE scheduled.  rxs sent.  Thanks.  

## 2012-12-15 NOTE — Telephone Encounter (Signed)
LMOVM to schedule CPE and Rx's sent.

## 2012-12-19 ENCOUNTER — Ambulatory Visit: Payer: Medicare Other

## 2012-12-20 ENCOUNTER — Ambulatory Visit: Payer: Medicare Other

## 2012-12-21 ENCOUNTER — Ambulatory Visit: Payer: Medicare Other

## 2012-12-26 ENCOUNTER — Encounter: Payer: Self-pay | Admitting: Gastroenterology

## 2012-12-26 ENCOUNTER — Encounter: Payer: Self-pay | Admitting: *Deleted

## 2012-12-26 ENCOUNTER — Encounter: Payer: Self-pay | Admitting: Family Medicine

## 2012-12-26 ENCOUNTER — Ambulatory Visit (INDEPENDENT_AMBULATORY_CARE_PROVIDER_SITE_OTHER): Payer: Medicare Other | Admitting: Family Medicine

## 2012-12-26 VITALS — BP 126/70 | HR 78 | Temp 98.2°F | Ht 63.0 in | Wt 221.0 lb

## 2012-12-26 DIAGNOSIS — Z0000 Encounter for general adult medical examination without abnormal findings: Secondary | ICD-10-CM

## 2012-12-26 DIAGNOSIS — Z1211 Encounter for screening for malignant neoplasm of colon: Secondary | ICD-10-CM

## 2012-12-26 DIAGNOSIS — M25569 Pain in unspecified knee: Secondary | ICD-10-CM

## 2012-12-26 DIAGNOSIS — E039 Hypothyroidism, unspecified: Secondary | ICD-10-CM

## 2012-12-26 DIAGNOSIS — I10 Essential (primary) hypertension: Secondary | ICD-10-CM

## 2012-12-26 DIAGNOSIS — Z1331 Encounter for screening for depression: Secondary | ICD-10-CM

## 2012-12-26 DIAGNOSIS — I4891 Unspecified atrial fibrillation: Secondary | ICD-10-CM

## 2012-12-26 LAB — LIPID PANEL
Cholesterol: 220 mg/dL — ABNORMAL HIGH (ref 0–200)
Total CHOL/HDL Ratio: 5
Triglycerides: 63 mg/dL (ref 0.0–149.0)
VLDL: 12.6 mg/dL (ref 0.0–40.0)

## 2012-12-26 LAB — COMPREHENSIVE METABOLIC PANEL
AST: 21 U/L (ref 0–37)
Albumin: 3.6 g/dL (ref 3.5–5.2)
Alkaline Phosphatase: 96 U/L (ref 39–117)
BUN: 14 mg/dL (ref 6–23)
Glucose, Bld: 88 mg/dL (ref 70–99)
Potassium: 4.2 mEq/L (ref 3.5–5.1)
Sodium: 139 mEq/L (ref 135–145)
Total Bilirubin: 0.8 mg/dL (ref 0.3–1.2)
Total Protein: 7.5 g/dL (ref 6.0–8.3)

## 2012-12-26 LAB — PROTIME-INR: Prothrombin Time: 12.4 s (ref 10.2–12.4)

## 2012-12-26 MED ORDER — POTASSIUM CHLORIDE ER 10 MEQ PO TBCR: 10.0000 meq | EXTENDED_RELEASE_TABLET | Freq: Every day | ORAL | Status: DC

## 2012-12-26 MED ORDER — LEVOTHYROXINE SODIUM 125 MCG PO TABS: 125.0000 ug | ORAL_TABLET | Freq: Every day | ORAL | Status: DC

## 2012-12-26 MED ORDER — METOPROLOL TARTRATE 50 MG PO TABS: 50.0000 mg | ORAL_TABLET | Freq: Every day | ORAL | Status: DC

## 2012-12-26 MED ORDER — WARFARIN SODIUM 3 MG PO TABS: ORAL_TABLET | ORAL | Status: DC

## 2012-12-26 MED ORDER — LOSARTAN POTASSIUM 100 MG PO TABS: 100.0000 mg | ORAL_TABLET | Freq: Every day | ORAL | Status: DC

## 2012-12-26 NOTE — Assessment & Plan Note (Signed)
Continue replacement, no TMG on exam, see notes on labs.

## 2012-12-26 NOTE — Assessment & Plan Note (Addendum)
Routine anticipatory guidance given to patient.  See health maintenance. Losing weigh intentionally with mainly vegetarian diet.  Tetanus 2012 PNA 2012 Flu shot done 2013 shingles shot prev done Colonoscopy 2009, was prev advised 5 year f/u.   Pap smear not indicated.  No h/o abnormal paps.   Mammogram d/w pt.  Encouraged to consider.  She wouldn't want treatment if abnormality found.  She is aware of morbidity/mortality risk.  Living will d/w pt.  Daughter Clydie Braun (daughter) would be designated if incapacitated.  DXA declined as she wouldn't want rx treatment.  Mortality/morbidity discussed.   D/w pt about fall cautions and she is continuing to use a cane.

## 2012-12-26 NOTE — Patient Instructions (Signed)
Go to the lab on the way out.  We'll contact you with your lab report.  See Shirlee Limerick about your referral before you leave today. Don't change your meds for now.  Take care.  Glad to see you.

## 2012-12-26 NOTE — Assessment & Plan Note (Signed)
Resolved after treatment per Dr. Patsy Lager.

## 2012-12-26 NOTE — Progress Notes (Signed)
CPE- See plan.  Routine anticipatory guidance given to patient.  See health maintenance. Losing weigh intentionally with mainly vegetarian diet.  Tetanus 2012 PNA 2012 Flu shot done 2013 shingles shot prev done Colonoscopy 2009, was prev advised 5 year f/u.   Pap smear not indicated.  No h/o abnormal paps.   Mammogram d/w pt.  Encouraged to consider.  She wouldn't want treatment if abnormality found.  She is aware of morbidity/mortality risk.  Living will d/w pt.  Daughter Clydie Braun (daughter) would be designated if incapacitated.  DXA declined as she wouldn't want rx treatment.  Mortality/morbidity discussed.    H/o hypothyroidism.  On replacement, no neck pain, neck mass.  Due for labs.    H/o AF in distant past and coumadin dose was lowered (per cards it could have been stopped).  No heart racing, skipped beat, CP, SOB.  No falls.  No bruising (except for small superficial bruises occ) or bleeding.   Hypertension:   Using medication without problems or lightheadedness: yes Chest pain with exertion:no Edema:no Short of breath:no  PMH and SH reviewed  Meds, vitals, and allergies reviewed.   ROS: See HPI.  Otherwise negative.    GEN: nad, alert and oriented HEENT: mucous membranes moist NECK: supple w/o LA CV: rrr. PULM: ctab, no inc wob ABD: soft, +bs EXT: no edema SKIN: no acute rash, minimal bruising noted on the dorsum of L hand.

## 2012-12-26 NOTE — Assessment & Plan Note (Signed)
No sig bleeding.  She'd like to continue the 50% dose of coumadin with checks q3 months.  No sx of AF that she has noted.  Doing well.

## 2012-12-26 NOTE — Assessment & Plan Note (Signed)
Controlled, continue current meds.  See notes on labs.  Losing weight intentionally.

## 2012-12-28 ENCOUNTER — Ambulatory Visit: Payer: Medicare Other

## 2013-01-08 ENCOUNTER — Other Ambulatory Visit: Payer: Self-pay | Admitting: *Deleted

## 2013-01-08 MED ORDER — METOPROLOL TARTRATE 50 MG PO TABS: 50.0000 mg | ORAL_TABLET | Freq: Every day | ORAL | Status: DC

## 2013-01-16 ENCOUNTER — Ambulatory Visit: Payer: Medicare Other | Admitting: Gastroenterology

## 2013-03-26 ENCOUNTER — Ambulatory Visit (INDEPENDENT_AMBULATORY_CARE_PROVIDER_SITE_OTHER): Payer: Medicare Other | Admitting: General Practice

## 2013-03-26 DIAGNOSIS — I4891 Unspecified atrial fibrillation: Secondary | ICD-10-CM

## 2013-03-26 DIAGNOSIS — Z5181 Encounter for therapeutic drug level monitoring: Secondary | ICD-10-CM

## 2013-03-26 DIAGNOSIS — Z7901 Long term (current) use of anticoagulants: Secondary | ICD-10-CM

## 2013-05-01 ENCOUNTER — Ambulatory Visit: Payer: Medicare Other | Admitting: Cardiovascular Disease

## 2013-05-14 ENCOUNTER — Encounter: Payer: Self-pay | Admitting: Cardiovascular Disease

## 2013-05-14 ENCOUNTER — Ambulatory Visit (INDEPENDENT_AMBULATORY_CARE_PROVIDER_SITE_OTHER): Payer: Medicare Other | Admitting: Cardiovascular Disease

## 2013-05-14 VITALS — BP 150/86 | HR 85 | Ht 62.0 in | Wt 221.8 lb

## 2013-05-14 DIAGNOSIS — E785 Hyperlipidemia, unspecified: Secondary | ICD-10-CM

## 2013-05-14 DIAGNOSIS — I10 Essential (primary) hypertension: Secondary | ICD-10-CM

## 2013-05-14 DIAGNOSIS — E669 Obesity, unspecified: Secondary | ICD-10-CM

## 2013-05-14 DIAGNOSIS — I4891 Unspecified atrial fibrillation: Secondary | ICD-10-CM

## 2013-05-14 MED ORDER — METOPROLOL TARTRATE 25 MG PO TABS: 25.0000 mg | ORAL_TABLET | Freq: Two times a day (BID) | ORAL | Status: DC

## 2013-05-14 NOTE — Assessment & Plan Note (Signed)
We have recommended she try red yeast rice. She was started on one pill per day, slow titration upwards. Last cholesterol 220

## 2013-05-14 NOTE — Assessment & Plan Note (Signed)
We have recommended that she take metoprolol twice a day, possibly 25 mg twice a day or 50 mg in the morning and 25 mg in the p.m.. If she has recurrent episodes of atrial fibrillation, we have suggested she take extra 25 or 50 mg metoprolol and call our office.

## 2013-05-14 NOTE — Progress Notes (Signed)
Patient ID: Julie Phillips, female    DOB: 1932-05-04, 79 y.o.   MRN: [id number]  HPI Comments: Julie Phillips  is a pleasant 79 year old woman with remote history of atrial fibrillation in 2007, low potassium by her report, who has been on chronic warfarin, history of hypertension who presents for routine followup.  She reports that last weekend, she had palpitations, rapid heart beat, felt lightheaded for approximately 40 minutes. Symptoms presented while at rest.  And eventually symptoms resolved. She reports taking metoprolol 50 mg in the morning, no metoprolol in the evening. Recent episode of tachycardia was in the early evening. She has not had one of these episodes of tachycardia in a long time, possibly 2 years. She is relatively active, walks with a cane and walker for balance. No significant chest pain or shortness of breath with exertion Blood pressure at home typically runs 135-140 systolic at home. She has tried statins in the past and had a problem with her "urine". Details are uncertain  Previous echocardiogram that showed mild MR and TR, normal LV systolic function  Stress test was normal showing no ischemia with normal ejection fraction . Carotid ultrasound showed minimal chronic plaquing .  EKG shows normal sinus rhythm with rate 85  beats per minute, nonspecific ST abnormality   Outpatient Encounter Prescriptions as of 05/14/2013  Medication Sig Dispense Refill  . calcipotriene-betamethasone (TACLONEX SCALP) external suspension Apply topically daily.      . Calcium Carbonate-Vitamin D (CALTRATE 600+D PO) Take by mouth daily.      . Cobalamine Combinations (VITAMIN B12-FOLIC ACID) 500-400 MCG TABS Take 1 tablet by mouth daily.      . famotidine (PEPCID) 20 MG tablet Take 20 mg by mouth 2 (two) times daily.      Marland Kitchen ketoconazole (NIZORAL) 2 % cream Apply topically daily.      Marland Kitchen levothyroxine (SYNTHROID, LEVOTHROID) 125 MCG tablet Take 1 tablet (125 mcg total) by mouth daily.   90 tablet  3  . losartan (COZAAR) 100 MG tablet Take 1 tablet (100 mg total) by mouth daily.  90 tablet  3  . Lutein-Zeaxanthin 15-0.7 MG CAPS Take 2 capsules by mouth daily.      . metoprolol (LOPRESSOR) 50 MG tablet Take 1 tablet (50 mg total) by mouth daily.  30 tablet  0  . Multiple Vitamins-Minerals (VISION FORMULA) TABS Take by mouth daily.      . potassium chloride (K-DUR) 10 MEQ tablet Take 1 tablet (10 mEq total) by mouth daily.  90 tablet  3  . pyridOXINE (VITAMIN B-6) 100 MG tablet Take 100 mg by mouth daily.      Marland Kitchen triamcinolone cream (KENALOG) 0.1 % Apply topically daily.       . vitamin B-12 (CYANOCOBALAMIN) 1000 MCG tablet Take 1,000 mcg by mouth daily.      Marland Kitchen warfarin (COUMADIN) 3 MG tablet 1 tab every day and then 1 extra tab on MWF (10 tabs per week)  130 tablet  3  . [DISCONTINUED] hydrocortisone 2.5 % cream Apply 1 application topically 2 (two) times daily.       No facility-administered encounter medications on file as of 05/14/2013.   Review of Systems  Constitutional: Negative.   HENT: Negative.   Eyes: Negative.   Respiratory: Negative.   Cardiovascular: Negative.   Gastrointestinal: Negative.   Musculoskeletal: Positive for gait problem.  Skin: Negative.   Neurological: Negative.   Psychiatric/Behavioral: Negative.   All other systems reviewed and are negative.  BP 150/86  Pulse 85  Ht 5\' 2"  (1.575 m)  Wt 221 lb 12 oz (100.585 kg)  BMI 40.55 kg/m2  Physical Exam  Nursing note and vitals reviewed. Constitutional: She is oriented to person, place, and time. She appears well-developed and well-nourished.  Obese  HENT:  Head: Normocephalic.  Nose: Nose normal.  Mouth/Throat: Oropharynx is clear and moist.  Eyes: Conjunctivae are normal. Pupils are equal, round, and reactive to light.  Neck: Normal range of motion. Neck supple. No JVD present.  Cardiovascular: Normal rate, regular rhythm, S1 normal, S2 normal, normal heart sounds and intact distal  pulses.  Exam reveals no gallop and no friction rub.   No murmur heard. Pulmonary/Chest: Effort normal and breath sounds normal. No respiratory distress. She has no wheezes. She has no rales. She exhibits no tenderness.  Abdominal: Soft. Bowel sounds are normal. She exhibits no distension. There is no tenderness.  Musculoskeletal: Normal range of motion. She exhibits no edema and no tenderness.  Lymphadenopathy:    She has no cervical adenopathy.  Neurological: She is alert and oriented to person, place, and time. Coordination normal.  Skin: Skin is warm and dry. Rash noted. No erythema.  Erythematous rash of the right lower extremity below the knee  Psychiatric: She has a normal mood and affect. Her behavior is normal. Judgment and thought content normal.    Assessment and Plan

## 2013-05-14 NOTE — Assessment & Plan Note (Signed)
Blood pressure is well controlled on today's visit. We have suggested extra metoprolol at night for rhythm control

## 2013-05-14 NOTE — Patient Instructions (Addendum)
You are doing well.  Please add metoprolol 25 mg in the Am,  50 mg in the AM  For atrial fibrillation, take extra metoprolol  Consider Red Yeast Rice for cholesterol  Please call us if you have new issues that need to be addressed before your next appt.  Your physician wants you to follow-up in: 6 months.  You will receive a reminder letter in the mail two months in advance. If you don't receive a letter, please call our office to schedule the follow-up appointment.

## 2013-05-14 NOTE — Assessment & Plan Note (Signed)
We have encouraged continued exercise, careful diet management in an effort to lose weight. 

## 2013-06-25 ENCOUNTER — Telehealth: Payer: Self-pay | Admitting: Family Medicine

## 2013-06-25 ENCOUNTER — Ambulatory Visit (INDEPENDENT_AMBULATORY_CARE_PROVIDER_SITE_OTHER): Payer: Self-pay | Admitting: Family Medicine

## 2013-06-25 DIAGNOSIS — I4891 Unspecified atrial fibrillation: Secondary | ICD-10-CM

## 2013-06-25 DIAGNOSIS — Z5181 Encounter for therapeutic drug level monitoring: Secondary | ICD-10-CM

## 2013-06-25 DIAGNOSIS — Z7901 Long term (current) use of anticoagulants: Secondary | ICD-10-CM

## 2013-06-25 NOTE — Telephone Encounter (Signed)
I can talk to her at the OV.  Would plan to continue q3 month.

## 2013-06-25 NOTE — Telephone Encounter (Signed)
Julie Phillips (previous Coumadin RN) had put a note in pt's chart saying that she only needed INR every 3 months.  INR 1.3 today.  I would like to recheck INR next week during her OV with you and possibly start checking her every month if that's ok with you.  Pt agreed to this.

## 2013-07-03 ENCOUNTER — Ambulatory Visit (INDEPENDENT_AMBULATORY_CARE_PROVIDER_SITE_OTHER): Payer: Medicare Other | Admitting: Family Medicine

## 2013-07-03 ENCOUNTER — Encounter: Payer: Self-pay | Admitting: Family Medicine

## 2013-07-03 VITALS — BP 142/60 | HR 88 | Temp 98.2°F | Wt 224.0 lb

## 2013-07-03 DIAGNOSIS — Z1211 Encounter for screening for malignant neoplasm of colon: Secondary | ICD-10-CM

## 2013-07-03 DIAGNOSIS — I10 Essential (primary) hypertension: Secondary | ICD-10-CM

## 2013-07-03 DIAGNOSIS — I4891 Unspecified atrial fibrillation: Secondary | ICD-10-CM

## 2013-07-03 MED ORDER — METOPROLOL TARTRATE 50 MG PO TABS: 50.0000 mg | ORAL_TABLET | Freq: Every day | ORAL | Status: DC

## 2013-07-03 NOTE — Assessment & Plan Note (Signed)
Reasonable control and continue as is. She agrees.

## 2013-07-03 NOTE — Patient Instructions (Addendum)
See Bonita Quin about your referral before you leave today. Let me know if you have questions on your meds.  Schedule a recheck for your INR in the coumadin clinic in 10/14.  Take care.  Recheck at a physical in about 6 months with labs ahead of time.

## 2013-07-03 NOTE — Progress Notes (Signed)
Episode of AF after a salt load.  This self resolved.  No more in the interval. We discussed the INR and coumadin use.  She is on 50% dose intentionally.  She could have come off coumadin prev.  She had seen cards earlier in the year.  She can feel when she goes into fib.   Hypertension:  Using medication without problems or lightheadedness: yes Chest pain with exertion:no Edema:no Short of breath:no Average home BPs: not checked.   She is going to try to get on red yeast rice.  Discussed. Hasn't started yet.    Meds, vitals, and allergies reviewed.   PMH and SH reviewed  ROS: See HPI.  Otherwise negative.    GEN: nad, alert and oriented, obese HEENT: mucous membranes moist NECK: supple w/o LA CV: rrr. PULM: ctab, no inc wob ABD: soft, +bs EXT: no edema SKIN: no acute rash

## 2013-07-03 NOTE — Assessment & Plan Note (Signed)
She sounds to be NSR today.  Continue as is.  She'll check her BB dose.  Taking it once a day and doing well.  D/w pt about diet.

## 2013-07-09 ENCOUNTER — Telehealth: Payer: Self-pay

## 2013-07-09 ENCOUNTER — Encounter: Payer: Self-pay | Admitting: Family Medicine

## 2013-07-09 ENCOUNTER — Ambulatory Visit (INDEPENDENT_AMBULATORY_CARE_PROVIDER_SITE_OTHER): Payer: Medicare Other | Admitting: Family Medicine

## 2013-07-09 DIAGNOSIS — Z91018 Allergy to other foods: Secondary | ICD-10-CM | POA: Insufficient documentation

## 2013-07-09 DIAGNOSIS — Z5189 Encounter for other specified aftercare: Secondary | ICD-10-CM

## 2013-07-09 NOTE — Patient Instructions (Addendum)
Use a gluten free diet and report back if the symptoms continue.  This appears to be food related.  I don't these are TIAs.

## 2013-07-09 NOTE — Progress Notes (Signed)
Sx started Saturday night.  She has a food allergy to wheat and didn't know if this was food related (ate pizza Saturday night).  She had a paresthesia on the L side of the face.  No swelling on the face. No motor changes/droop on the L side of face or o/w. The sx resolved at that point.  She had tiramisu and the sx returned on Sunday.  It again resolved.  She had it happen today twice w/o typical wheat exposure, but she had some oatmeal cookies this AM.   She has no pain. No drooling. "It feels like a novocain shot".  She had two episodes of "dizziness, like I need to sit down" but w/o room spinning.  No rash.     She has had L facial numbness with wheat exposure previously, also with other foods like ramen.  No dysphagia, no wheeze.  No fevers.  No new meds.  No CP, no sx of AF per patient report.   The episodes last <10 minutes.  She has no numbness now.    Meds, vitals, and allergies reviewed.   ROS: See HPI.  Otherwise, noncontributory.  GEN: nad, alert and oriented HEENT: mucous membranes moist NECK: supple w/o LA CV: rrr. PULM: ctab, no inc wob ABD: soft, +bs EXT: no edema SKIN: no acute rash CN 2-12 wnl B, S/S/DTR wnl x4

## 2013-07-09 NOTE — Assessment & Plan Note (Signed)
D/w pt.  It would be unlikely to have 4 identical TIAs in 3 days with the same sx as prev with a wheat exposure- it had happened in past. This is atypical but apparently consistent for her.  Would go gluten free for now and have her monitor sx.  No edema, OP sx/findings that are alarming.  No new sx now.  She agrees.  F/u prn.

## 2013-07-09 NOTE — Telephone Encounter (Signed)
Pt presents today in lobby describes what sounds to be nerve pain in L side of face onset 3 days ago.  Pt denies CP, SOB, speech is clear and coherent, no apparent cognitive impairment evident.  Advised pt to call Dr Lianne Bushy office pt's primary MD for assessment of facial pain.  Pt agrees with plan and will call [facility] for appt.

## 2013-07-18 ENCOUNTER — Telehealth: Payer: Self-pay | Admitting: *Deleted

## 2013-07-18 NOTE — Telephone Encounter (Signed)
[facility] asking for clearance to hold coumadin for 5 days prior to colonoscopy?

## 2013-07-18 NOTE — Telephone Encounter (Signed)
Yes, hold for 5 days.  Thanks.

## 2013-07-19 ENCOUNTER — Encounter: Payer: Self-pay | Admitting: *Deleted

## 2013-07-19 NOTE — Telephone Encounter (Signed)
Letter faxed to [facility] per their request.  Fax:  [phone], Attention Vernona Rieger.

## 2013-08-20 ENCOUNTER — Telehealth: Payer: Self-pay

## 2013-08-20 NOTE — Telephone Encounter (Signed)
Pt left v/m requesting refill on 5 meds to [facility]; no names listed. Left v/m for pt to cb.

## 2013-08-21 NOTE — Telephone Encounter (Signed)
Pt will contact [facility] for refills (pt has yr refill from 12/2012). Pt voiced understanding.-

## 2013-08-27 ENCOUNTER — Other Ambulatory Visit: Payer: Self-pay | Admitting: *Deleted

## 2013-08-27 MED ORDER — LOSARTAN POTASSIUM 100 MG PO TABS: 100.0000 mg | ORAL_TABLET | Freq: Every day | ORAL | Status: DC

## 2013-08-27 MED ORDER — LEVOTHYROXINE SODIUM 125 MCG PO TABS: 125.0000 ug | ORAL_TABLET | Freq: Every day | ORAL | Status: DC

## 2013-08-27 NOTE — Telephone Encounter (Signed)
Received faxed refill request from pharmacy. See allergy/contraindication with Losartan. Last office visit 07/09/13. Is it okay to refill medication?

## 2013-08-27 NOTE — Telephone Encounter (Signed)
both sent

## 2013-09-11 ENCOUNTER — Ambulatory Visit: Payer: Self-pay | Admitting: Internal Medicine

## 2013-09-24 ENCOUNTER — Ambulatory Visit: Payer: Medicare Other

## 2013-10-08 ENCOUNTER — Encounter: Payer: Self-pay | Admitting: Family Medicine

## 2013-10-08 ENCOUNTER — Ambulatory Visit (INDEPENDENT_AMBULATORY_CARE_PROVIDER_SITE_OTHER): Payer: Medicare Other | Admitting: Family Medicine

## 2013-10-08 VITALS — BP 138/70 | HR 68 | Temp 98.0°F

## 2013-10-08 DIAGNOSIS — Z23 Encounter for immunization: Secondary | ICD-10-CM

## 2013-10-08 DIAGNOSIS — I4891 Unspecified atrial fibrillation: Secondary | ICD-10-CM

## 2013-10-08 DIAGNOSIS — T17908A Unspecified foreign body in respiratory tract, part unspecified causing other injury, initial encounter: Secondary | ICD-10-CM | POA: Insufficient documentation

## 2013-10-08 DIAGNOSIS — L0390 Cellulitis, unspecified: Secondary | ICD-10-CM | POA: Insufficient documentation

## 2013-10-08 DIAGNOSIS — L0291 Cutaneous abscess, unspecified: Secondary | ICD-10-CM

## 2013-10-08 DIAGNOSIS — Z5181 Encounter for therapeutic drug level monitoring: Secondary | ICD-10-CM

## 2013-10-08 DIAGNOSIS — Z7901 Long term (current) use of anticoagulants: Secondary | ICD-10-CM

## 2013-10-08 NOTE — Patient Instructions (Signed)
Use warm compresses and bacitracin.  If not resolving, then let me know.  Stop the coumadin, notify us of any AF episodes.   Take care.  Glad to see you.

## 2013-10-08 NOTE — Progress Notes (Signed)
ASP after colonoscopy.  No other episodes prev. Was seen at ER and then released from ER to home.  She was fatigued after the episode for a few days.  No FCNAVD.  Not SOB.  No BP.  No BLE edema other than occ baseline edema.  We talked about not continuing any more colonoscopy eval. She agreed.   Now with lump under L arm.  Woke up with it on Saturday AM.  Smaller in the meantime now.  Mildly ttp.  Sore to the touch.  Mild redness.  No drainage.  Declined mammograms prev.  No breast changes or masses.  Using topical bacitracin.    We talked about coumadin.  Prev offered to come off med.  Will "graduate" from coumadin today.  No recent episodes of AF.    Due for flu shot.    Meds, vitals, and allergies reviewed.   ROS: See HPI.  Otherwise, noncontributory.  nad ncat Mmm rrr ctab abd soft  Ext w/o edema Small amount of erythema in the L axilla. Smaller and less tender than prev per patient.

## 2013-10-08 NOTE — Assessment & Plan Note (Signed)
Stop coumadin. NSR for prolonged period.

## 2013-10-08 NOTE — Assessment & Plan Note (Signed)
Very small area, improved with topical tx.  Use topical OTC ointment and warm compresses and f/u prn. She agrees.

## 2013-10-08 NOTE — Assessment & Plan Note (Signed)
D/w pt. Would avoid colonoscopy in the future.  She has no residual sx and wouldn't change anything thing at this point pulmonary-wise.  F/u prn.  She agrees.  Appears resolved.

## 2013-10-16 ENCOUNTER — Other Ambulatory Visit: Payer: Self-pay | Admitting: *Deleted

## 2013-10-16 MED ORDER — POTASSIUM CHLORIDE ER 10 MEQ PO TBCR: 10.0000 meq | EXTENDED_RELEASE_TABLET | Freq: Every day | ORAL | Status: DC

## 2013-11-12 ENCOUNTER — Encounter: Payer: Self-pay | Admitting: Cardiovascular Disease

## 2013-11-12 ENCOUNTER — Ambulatory Visit (INDEPENDENT_AMBULATORY_CARE_PROVIDER_SITE_OTHER): Payer: Medicare Other | Admitting: Cardiovascular Disease

## 2013-11-12 VITALS — BP 120/72 | HR 65 | Ht 62.0 in | Wt 222.5 lb

## 2013-11-12 DIAGNOSIS — E785 Hyperlipidemia, unspecified: Secondary | ICD-10-CM

## 2013-11-12 DIAGNOSIS — I4891 Unspecified atrial fibrillation: Secondary | ICD-10-CM

## 2013-11-12 DIAGNOSIS — I10 Essential (primary) hypertension: Secondary | ICD-10-CM

## 2013-11-12 DIAGNOSIS — E669 Obesity, unspecified: Secondary | ICD-10-CM

## 2013-11-12 NOTE — Assessment & Plan Note (Signed)
Encouraged her to try red yeast rice, fiber, oatmeal. We did discuss other options such as zetia and welchol

## 2013-11-12 NOTE — Assessment & Plan Note (Signed)
Blood pressure is well controlled on today's visit. No changes made to the medications. 

## 2013-11-12 NOTE — Assessment & Plan Note (Signed)
We have encouraged continued exercise, careful diet management in an effort to lose weight. 

## 2013-11-12 NOTE — Progress Notes (Signed)
Patient ID: Julie Phillips, female    DOB: March 13, 1932, 79 y.o.   MRN: [id number]  HPI Comments: Julie Phillips  is a pleasant 79 year old woman with remote history of atrial fibrillation in 2007, low potassium by her report, previously on warfarin, history of hypertension who presents for routine followup.  In followup today, she denies any tachycardia or fast heart rhythms. Overall she feels well with no complaints. She reports having colonoscopy September 2014. She had nausea vomiting following anesthesia, at mild aspiration, reports that initially wanted to keep her in the hospital but she refused and went home. She recovered over the next several days without any complications. Since then she has felt well.   She is relatively active, walks with a cane and walker for balance. No significant chest pain or shortness of breath with exertion Blood pressure at home typically runs 135-140 systolic at home. She has tried statins in the past and had a problem with her "urine". Details are uncertain  Previous echocardiogram that showed mild MR and TR, normal LV systolic function  Stress test was normal showing no ischemia with normal ejection fraction . Carotid ultrasound showed minimal chronic plaquing .  EKG shows normal sinus rhythm with rate 65  beats per minute, nonspecific ST abnormality   Outpatient Encounter Prescriptions as of 11/12/2013  Medication Sig  . calcipotriene-betamethasone (TACLONEX SCALP) external suspension Apply topically daily.  . Calcium Carbonate-Vitamin D (CALTRATE 600+D PO) Take by mouth daily.  . Cobalamine Combinations (VITAMIN B12-FOLIC ACID) 500-400 MCG TABS Take 1 tablet by mouth daily.  . famotidine (PEPCID) 20 MG tablet Take 20 mg by mouth 2 (two) times daily.  Marland Kitchen ketoconazole (NIZORAL) 2 % cream Apply topically daily.  Marland Kitchen levothyroxine (SYNTHROID, LEVOTHROID) 125 MCG tablet Take 1 tablet (125 mcg total) by mouth daily.  Marland Kitchen losartan (COZAAR) 100 MG tablet Take 1  tablet (100 mg total) by mouth daily.  . Lutein-Zeaxanthin 15-0.7 MG CAPS Take 2 capsules by mouth daily.  . metoprolol tartrate (LOPRESSOR) 50 MG tablet Take 1 tablet (50 mg total) by mouth daily.  . Multiple Vitamins-Minerals (VISION FORMULA) TABS Take by mouth daily.  . potassium chloride (K-DUR) 10 MEQ tablet Take 1 tablet (10 mEq total) by mouth daily.  Marland Kitchen pyridOXINE (VITAMIN B-6) 100 MG tablet Take 100 mg by mouth daily.  Marland Kitchen triamcinolone cream (KENALOG) 0.1 % Apply topically daily.   . vitamin B-12 (CYANOCOBALAMIN) 1000 MCG tablet Take 1,000 mcg by mouth daily.   Review of Systems  Constitutional: Negative.   HENT: Negative.   Eyes: Negative.   Respiratory: Negative.   Cardiovascular: Negative.   Gastrointestinal: Negative.   Musculoskeletal: Positive for gait problem.  Skin: Negative.   Neurological: Negative.   Psychiatric/Behavioral: Negative.   All other systems reviewed and are negative.    BP 120/72  Pulse 65  Ht 5\' 2"  (1.575 m)  Wt 222 lb 8 oz (100.925 kg)  BMI 40.69 kg/m2  Physical Exam  Nursing note and vitals reviewed. Constitutional: She is oriented to person, place, and time. She appears well-developed and well-nourished.  Obese  HENT:  Head: Normocephalic.  Nose: Nose normal.  Mouth/Throat: Oropharynx is clear and moist.  Eyes: Conjunctivae are normal. Pupils are equal, round, and reactive to light.  Neck: Normal range of motion. Neck supple. No JVD present.  Cardiovascular: Normal rate, regular rhythm, S1 normal, S2 normal, normal heart sounds and intact distal pulses.  Exam reveals no gallop and no friction rub.   No murmur  heard. Pulmonary/Chest: Effort normal and breath sounds normal. No respiratory distress. She has no wheezes. She has no rales. She exhibits no tenderness.  Abdominal: Soft. Bowel sounds are normal. She exhibits no distension. There is no tenderness.  Musculoskeletal: Normal range of motion. She exhibits no edema and no tenderness.   Lymphadenopathy:    She has no cervical adenopathy.  Neurological: She is alert and oriented to person, place, and time. Coordination normal.  Skin: Skin is warm and dry. No erythema.  Psychiatric: She has a normal mood and affect. Her behavior is normal. Judgment and thought content normal.    Assessment and Plan

## 2013-11-12 NOTE — Patient Instructions (Signed)
You are doing well. No medication changes were made.  Please call us if you have new issues that need to be addressed before your next appt.  Your physician wants you to follow-up in: 12 months.  You will receive a reminder letter in the mail two months in advance. If you don't receive a letter, please call our office to schedule the follow-up appointment. 

## 2013-11-12 NOTE — Assessment & Plan Note (Signed)
And no arrhythmia since her last clinic visit. We have suggested she stay on her metoprolol. She takes this only in the morning

## 2013-12-26 ENCOUNTER — Telehealth: Payer: Self-pay | Admitting: Family Medicine

## 2013-12-26 DIAGNOSIS — I10 Essential (primary) hypertension: Secondary | ICD-10-CM

## 2013-12-26 DIAGNOSIS — E039 Hypothyroidism, unspecified: Secondary | ICD-10-CM

## 2013-12-26 DIAGNOSIS — E785 Hyperlipidemia, unspecified: Secondary | ICD-10-CM

## 2013-12-26 NOTE — Telephone Encounter (Signed)
Message copied by Abner Greenspan on Wed Dec 26, 2013  7:15 PM ------      Message from: Ellamae Sia      Created: Tue Dec 25, 2013  5:19 PM      Regarding: Lab orders for Thursday, 1.15.15       Patient is scheduled for CPX labs, please order future labs, Thanks , Terri            [facility] ------

## 2013-12-27 ENCOUNTER — Other Ambulatory Visit: Payer: Medicare Other

## 2013-12-31 ENCOUNTER — Other Ambulatory Visit (INDEPENDENT_AMBULATORY_CARE_PROVIDER_SITE_OTHER): Payer: Medicare Other

## 2013-12-31 DIAGNOSIS — E669 Obesity, unspecified: Secondary | ICD-10-CM

## 2013-12-31 DIAGNOSIS — E785 Hyperlipidemia, unspecified: Secondary | ICD-10-CM

## 2013-12-31 DIAGNOSIS — I10 Essential (primary) hypertension: Secondary | ICD-10-CM

## 2013-12-31 DIAGNOSIS — E039 Hypothyroidism, unspecified: Secondary | ICD-10-CM

## 2013-12-31 LAB — CBC WITH DIFFERENTIAL/PLATELET
BASOS ABS: 0 10*3/uL (ref 0.0–0.1)
Basophils Relative: 0.5 % (ref 0.0–3.0)
Eosinophils Absolute: 0.4 10*3/uL (ref 0.0–0.7)
Eosinophils Relative: 5 % (ref 0.0–5.0)
HCT: 37.1 % (ref 36.0–46.0)
Hemoglobin: 12.5 g/dL (ref 12.0–15.0)
LYMPHS PCT: 22.6 % (ref 12.0–46.0)
Lymphs Abs: 1.9 10*3/uL (ref 0.7–4.0)
MCHC: 33.7 g/dL (ref 30.0–36.0)
MCV: 88.2 fl (ref 78.0–100.0)
Monocytes Absolute: 0.6 10*3/uL (ref 0.1–1.0)
Monocytes Relative: 7.5 % (ref 3.0–12.0)
Neutro Abs: 5.4 10*3/uL (ref 1.4–7.7)
Neutrophils Relative %: 64.4 % (ref 43.0–77.0)
PLATELETS: 271 10*3/uL (ref 150.0–400.0)
RBC: 4.21 Mil/uL (ref 3.87–5.11)
RDW: 15 % — AB (ref 11.5–14.6)
WBC: 8.4 10*3/uL (ref 4.5–10.5)

## 2013-12-31 LAB — COMPREHENSIVE METABOLIC PANEL
ALT: 15 U/L (ref 0–35)
AST: 19 U/L (ref 0–37)
Albumin: 3.7 g/dL (ref 3.5–5.2)
Alkaline Phosphatase: 94 U/L (ref 39–117)
BUN: 11 mg/dL (ref 6–23)
CALCIUM: 8.8 mg/dL (ref 8.4–10.5)
CHLORIDE: 103 meq/L (ref 96–112)
CO2: 27 mEq/L (ref 19–32)
Creatinine, Ser: 0.7 mg/dL (ref 0.4–1.2)
GFR: 85.74 mL/min (ref >60.00)
Glucose, Bld: 77 mg/dL (ref 70–99)
Potassium: 3.8 mEq/L (ref 3.5–5.1)
SODIUM: 138 meq/L (ref 135–145)
Total Bilirubin: 0.4 mg/dL (ref 0.3–1.2)
Total Protein: 7.7 g/dL (ref 6.0–8.3)

## 2013-12-31 LAB — TSH: TSH: 0.29 u[IU]/mL — AB (ref 0.35–5.50)

## 2013-12-31 LAB — LIPID PANEL
Cholesterol: 209 mg/dL — ABNORMAL HIGH (ref 0–200)
HDL: 48.9 mg/dL (ref >39.00)
Total CHOL/HDL Ratio: 4
Triglycerides: 84 mg/dL (ref 0.0–149.0)
VLDL: 16.8 mg/dL (ref 0.0–40.0)

## 2013-12-31 LAB — LDL CHOLESTEROL, DIRECT: Direct LDL: 152.6 mg/dL

## 2014-01-03 ENCOUNTER — Ambulatory Visit (INDEPENDENT_AMBULATORY_CARE_PROVIDER_SITE_OTHER): Payer: Medicare Other | Admitting: Family Medicine

## 2014-01-03 ENCOUNTER — Encounter: Payer: Self-pay | Admitting: Family Medicine

## 2014-01-03 VITALS — BP 146/80 | HR 88 | Temp 98.0°F | Ht 61.0 in | Wt 224.0 lb

## 2014-01-03 DIAGNOSIS — E039 Hypothyroidism, unspecified: Secondary | ICD-10-CM

## 2014-01-03 DIAGNOSIS — Z0000 Encounter for general adult medical examination without abnormal findings: Secondary | ICD-10-CM

## 2014-01-03 DIAGNOSIS — I10 Essential (primary) hypertension: Secondary | ICD-10-CM

## 2014-01-03 MED ORDER — LEVOTHYROXINE SODIUM 125 MCG PO TABS
125.0000 ug | ORAL_TABLET | Freq: Every day | ORAL | Status: DC
Start: 2014-01-03 — End: 2015-01-14

## 2014-01-03 MED ORDER — METOPROLOL TARTRATE 50 MG PO TABS: 50.0000 mg | ORAL_TABLET | Freq: Every day | ORAL | Status: DC

## 2014-01-03 MED ORDER — LOSARTAN POTASSIUM 100 MG PO TABS: 100.0000 mg | ORAL_TABLET | Freq: Every day | ORAL | Status: DC

## 2014-01-03 MED ORDER — POTASSIUM CHLORIDE ER 10 MEQ PO TBCR: 10.0000 meq | EXTENDED_RELEASE_TABLET | Freq: Every day | ORAL | Status: DC

## 2014-01-03 NOTE — Progress Notes (Signed)
Pre-visit discussion using our clinic review tool. No additional management support is needed unless otherwise documented below in the visit note.  I have personally reviewed the Medicare Annual Wellness questionnaire and have noted 1. The patient's medical and social history 2. Their use of alcohol, tobacco or illicit drugs 3. Their current medications and supplements 4. The patient's functional ability including ADL's, fall risks, home safety risks and hearing or visual             impairment. 5. Diet and physical activities 6. Evidence for depression or mood disorders  The patients weight, height, BMI have been recorded in the chart and visual acuity is per eye clinic.  I have made referrals, counseling and provided education to the patient based review of the above and I have provided the pt with a written personalized care plan for preventive services.  See scanned forms.  Routine anticipatory guidance given to patient.  See health maintenance. Flu 2014 Shingles prev done.  PNA 2012 Tetanus 2012 Colonoscopy 2014 Breast cancer screening and DXA declined by patient.  D/w pt.  Advance directive d/w pt.  Santiago Glad, daughter designated if incapacitated.  Cognitive function addressed- see scanned forms- and if abnormal then additional documentation follows.   Hypertension:    Using medication without problems or lightheadedness: y Chest pain with exertion:n Edema:n Short of breath:n Labs d/w pt.   Hypothyroid.  TSH low, d/w pt.  No neck mass.    PMH and SH reviewed  Meds, vitals, and allergies reviewed.   ROS: See HPI.  Otherwise negative.    GEN: nad, alert and oriented HEENT: mucous membranes moist NECK: supple w/o LA CV: rrr. PULM: ctab, no inc wob ABD: soft, +bs EXT: no edema SKIN: no acute rash

## 2014-01-03 NOTE — Patient Instructions (Signed)
Recheck TSH at a lab visit in about 8 weeks.  Change thyroid med dose- 1 a day except for 0.5 on Sundays.  Take care.  Glad to see you.

## 2014-01-04 NOTE — Assessment & Plan Note (Signed)
Dec replacement to 0.5tab on Sunday, 1 tab a day o/w.  Recheck in 8 weeks.  She agrees.

## 2014-01-04 NOTE — Assessment & Plan Note (Signed)
Reasonable control, continue as is.  She agrees.   

## 2014-01-04 NOTE — Assessment & Plan Note (Signed)
See scanned forms.  Routine anticipatory guidance given to patient.  See health maintenance. Flu 2014 Shingles prev done.  PNA 2012 Tetanus 2012 Colonoscopy 2014 Breast cancer screening and DXA declined by patient.  D/w pt.  Advance directive d/w pt.  Santiago Glad, daughter designated if incapacitated.  Cognitive function addressed- see scanned forms- and if abnormal then additional documentation follows.

## 2014-01-07 ENCOUNTER — Ambulatory Visit: Payer: Medicare Other

## 2014-01-16 ENCOUNTER — Telehealth: Payer: Self-pay | Admitting: Family Medicine

## 2014-01-16 NOTE — Telephone Encounter (Signed)
Relevant patient education assigned to patient using Emmi. ° °

## 2014-03-01 ENCOUNTER — Other Ambulatory Visit: Payer: Medicare Other

## 2014-03-05 ENCOUNTER — Other Ambulatory Visit (INDEPENDENT_AMBULATORY_CARE_PROVIDER_SITE_OTHER): Payer: Medicare Other

## 2014-03-05 DIAGNOSIS — E039 Hypothyroidism, unspecified: Secondary | ICD-10-CM

## 2014-03-05 LAB — TSH: TSH: 0.46 u[IU]/mL (ref 0.35–5.50)

## 2014-03-07 ENCOUNTER — Encounter: Payer: Self-pay | Admitting: Family Medicine

## 2014-09-02 ENCOUNTER — Telehealth: Payer: Self-pay | Admitting: *Deleted

## 2014-09-02 NOTE — Telephone Encounter (Signed)
Noted, thanks!

## 2014-09-02 NOTE — Telephone Encounter (Signed)
Pt called and left voicemail stating that she is going traveling to [place] next month, and wanted to know what vaccines she would need. I called pt back, gave her the contact information for [employer] dept. I advised her to contact them, because they can tell her what she needs, and also administer it.

## 2014-09-03 ENCOUNTER — Ambulatory Visit: Payer: Medicare Other

## 2014-12-23 ENCOUNTER — Other Ambulatory Visit: Payer: Medicare Other

## 2014-12-30 ENCOUNTER — Encounter: Payer: Medicare Other | Admitting: Family Medicine

## 2015-01-08 ENCOUNTER — Other Ambulatory Visit: Payer: Self-pay | Admitting: Family Medicine

## 2015-01-08 DIAGNOSIS — I10 Essential (primary) hypertension: Secondary | ICD-10-CM

## 2015-01-09 ENCOUNTER — Other Ambulatory Visit (INDEPENDENT_AMBULATORY_CARE_PROVIDER_SITE_OTHER): Payer: PPO

## 2015-01-09 DIAGNOSIS — I10 Essential (primary) hypertension: Secondary | ICD-10-CM

## 2015-01-09 LAB — COMPREHENSIVE METABOLIC PANEL
ALT: 10 U/L (ref 0–35)
AST: 16 U/L (ref 0–37)
Albumin: 3.7 g/dL (ref 3.5–5.2)
Alkaline Phosphatase: 108 U/L (ref 39–117)
BUN: 11 mg/dL (ref 6–23)
CHLORIDE: 105 meq/L (ref 96–112)
CO2: 27 mEq/L (ref 19–32)
CREATININE: 0.75 mg/dL (ref 0.40–1.20)
Calcium: 9.3 mg/dL (ref 8.4–10.5)
GFR: 78.98 mL/min (ref >60.00)
GLUCOSE: 90 mg/dL (ref 70–99)
POTASSIUM: 4.2 meq/L (ref 3.5–5.1)
Sodium: 139 mEq/L (ref 135–145)
TOTAL PROTEIN: 7.2 g/dL (ref 6.0–8.3)
Total Bilirubin: 0.5 mg/dL (ref 0.2–1.2)

## 2015-01-09 LAB — LIPID PANEL
CHOL/HDL RATIO: 4
Cholesterol: 207 mg/dL — ABNORMAL HIGH (ref 0–200)
HDL: 56.1 mg/dL (ref >39.00)
LDL CALC: 136 mg/dL — AB (ref 0–99)
NONHDL: 150.9
TRIGLYCERIDES: 73 mg/dL (ref 0.0–149.0)
VLDL: 14.6 mg/dL (ref 0.0–40.0)

## 2015-01-09 LAB — TSH: TSH: 1.74 u[IU]/mL (ref 0.35–4.50)

## 2015-01-14 ENCOUNTER — Encounter: Payer: Self-pay | Admitting: Family Medicine

## 2015-01-14 ENCOUNTER — Ambulatory Visit (INDEPENDENT_AMBULATORY_CARE_PROVIDER_SITE_OTHER): Payer: PPO | Admitting: Family Medicine

## 2015-01-14 VITALS — BP 132/68 | HR 65 | Temp 97.5°F | Ht 61.0 in | Wt 223.0 lb

## 2015-01-14 DIAGNOSIS — E2839 Other primary ovarian failure: Secondary | ICD-10-CM

## 2015-01-14 DIAGNOSIS — E038 Other specified hypothyroidism: Secondary | ICD-10-CM

## 2015-01-14 DIAGNOSIS — Z0000 Encounter for general adult medical examination without abnormal findings: Secondary | ICD-10-CM

## 2015-01-14 DIAGNOSIS — I10 Essential (primary) hypertension: Secondary | ICD-10-CM

## 2015-01-14 DIAGNOSIS — Z7189 Other specified counseling: Secondary | ICD-10-CM

## 2015-01-14 MED ORDER — POTASSIUM CHLORIDE ER 10 MEQ PO TBCR: 10.0000 meq | EXTENDED_RELEASE_TABLET | Freq: Every day | ORAL | Status: DC

## 2015-01-14 MED ORDER — LOSARTAN POTASSIUM 100 MG PO TABS: 100.0000 mg | ORAL_TABLET | Freq: Every day | ORAL | Status: DC

## 2015-01-14 MED ORDER — METOPROLOL TARTRATE 50 MG PO TABS: 50.0000 mg | ORAL_TABLET | Freq: Every day | ORAL | Status: DC

## 2015-01-14 MED ORDER — LEVOTHYROXINE SODIUM 125 MCG PO TABS: 125.0000 ug | ORAL_TABLET | Freq: Every day | ORAL | Status: DC

## 2015-01-14 NOTE — Patient Instructions (Addendum)
Julie Phillips will call about your referral for the bone density test.   Drop off your forms when possible.   Take care.  Don't change your meds.  Keep exercising.   Recheck in 6 months about your BP.  Recheck in about 1 year for a preventive exam.  Glad to see you.  Call about seeing Dr. Rockey Situ, to check in with him.

## 2015-01-14 NOTE — Progress Notes (Signed)
Pre visit review using our clinic review tool, if applicable. No additional management support is needed unless otherwise documented below in the visit note.  I have personally reviewed the Medicare Annual Wellness questionnaire and have reviewed with patient (she forgot her forms so we went through the components and she'll drop off the form later) 1. The patient's medical and social history 2. Their use of alcohol, tobacco or illicit drugs 3. Their current medications and supplements 4. The patient's functional ability including ADL's, fall risks, home safety risks and hearing or visual             impairment. 5. Diet and physical activities 6. Evidence for depression or mood disorders  The patients weight, height, BMI have been recorded in the chart and visual acuity is per eye clinic.  I have made referrals, counseling and provided education to the patient based review of the above and I have provided the pt with a written personalized care plan for preventive services.  Provider list updated- see scanned forms (she'll drop them off).  Routine anticipatory guidance given to patient.  See health maintenance.  Flu 08/2014 outside of clinic.  Shingles prev done  PNA 08/2014 out of clinic (prevnar) Tetanus 2012 Colonoscopy done 2014 Breast cancer screening- declined by patient.  D/w pt.  DXA ordered.  Advance directive- daughter Julie Phillips if patient were incapacitated.  Cognitive function addressed- see scanned forms- and if abnormal then additional documentation follows.  Diet and exercise d/w pt, encouraged.   1 fall previously, on a rug that was malpositioned.  That has been moved in the meantime.   D/w pt about fall precautions.  Hearing and mood are normal per patient.   No deficit on memory testing, 3/3 recall, A&O, normal math and clock reading.   Hypertension:    Using medication without problems or lightheadedness: yes Chest pain with exertion:no Edema:no Short of  breath:no  Hypothyroid.  TSH wnl, d/w pt about labs.  No neck mass, no dysphagia.    PMH and SH reviewed  Meds, vitals, and allergies reviewed.   ROS: See HPI.  Otherwise negative.    GEN: nad, alert and oriented HEENT: mucous membranes moist NECK: supple w/o LA CV: rrr. PULM: ctab, no inc wob ABD: soft, +bs EXT: no edema SKIN: no acute rash

## 2015-01-15 DIAGNOSIS — Z7189 Other specified counseling: Secondary | ICD-10-CM | POA: Insufficient documentation

## 2015-01-15 NOTE — Assessment & Plan Note (Signed)
Controlled, continue as is.  Continue diet and exercise.  Labs d/w pt. No sig changes needed based on labs.  She agrees.

## 2015-01-15 NOTE — Assessment & Plan Note (Signed)
Controlled, TSH wnl, continue as is.  She agrees.  No tmg on exam.

## 2015-01-15 NOTE — Assessment & Plan Note (Signed)
Flu 08/2014 outside of clinic.  Shingles prev done  PNA 08/2014 out of clinic (prevnar) Tetanus 2012 Colonoscopy done 2014 Breast cancer screening- declined by patient.  D/w pt.  DXA ordered.  Advance directive- daughter Laurita Quint if patient were incapacitated.  Cognitive function addressed- see scanned forms- and if abnormal then additional documentation follows.  Diet and exercise d/w pt, encouraged.   1 fall previously, on a rug that was malpositioned.  That has been moved in the meantime.   D/w pt about fall precautions.  Hearing and mood are normal per patient.

## 2015-02-10 ENCOUNTER — Encounter: Payer: Self-pay | Admitting: Family Medicine

## 2015-02-10 ENCOUNTER — Ambulatory Visit (INDEPENDENT_AMBULATORY_CARE_PROVIDER_SITE_OTHER): Payer: PPO | Admitting: Family Medicine

## 2015-02-10 VITALS — BP 140/70 | HR 87 | Temp 97.8°F | Wt 223.0 lb

## 2015-02-10 DIAGNOSIS — J0100 Acute maxillary sinusitis, unspecified: Secondary | ICD-10-CM

## 2015-02-10 MED ORDER — AZITHROMYCIN 250 MG PO TABS: ORAL_TABLET | ORAL | Status: DC

## 2015-02-10 NOTE — Progress Notes (Signed)
Pre visit review using our clinic review tool, if applicable. No additional management support is needed unless otherwise documented below in the visit note.  Started with sx about 1 week ago.  Initially with rhinorrhea.  HA.  Voice is altered.  Diffuse aches.  Tried OTC meds w/o relief.  Facial pain, B sinuses.  Sleep is disrupted.  Appetite is decreased.  Drinking plenty of fluids.  Making urine at baseline.  No cough, no vomiting, no diarrhea.  Some sneezing.  No ear pain.    Meds, vitals, and allergies reviewed.   ROS: See HPI.  Otherwise, noncontributory.  GEN: nad, alert and oriented HEENT: mucous membranes moist, tm w/o erythema, nasal exam w/o erythema, clear discharge noted,  OP with cobblestoning, max sinuses ttp B NECK: supple w/o LA CV: rrr.   PULM: ctab, no inc wob EXT: no edema SKIN: no acute rash

## 2015-02-10 NOTE — Patient Instructions (Signed)
Start zithromax today and try to get some rest.  Drink plenty of fluids.  Nasal saline may help.  Glad to see you.

## 2015-02-10 NOTE — Assessment & Plan Note (Signed)
Nontoxic, zmax, rest and fluids, f/u prn.  She agrees.

## 2015-02-12 ENCOUNTER — Encounter: Payer: Self-pay | Admitting: Family Medicine

## 2015-02-12 ENCOUNTER — Ambulatory Visit: Payer: Self-pay | Admitting: Family Medicine

## 2015-02-13 ENCOUNTER — Encounter: Payer: Self-pay | Admitting: Family Medicine

## 2015-02-13 ENCOUNTER — Telehealth: Payer: Self-pay

## 2015-02-13 DIAGNOSIS — M8580 Other specified disorders of bone density and structure, unspecified site: Secondary | ICD-10-CM | POA: Insufficient documentation

## 2015-02-13 NOTE — Telephone Encounter (Signed)
-----   Message from Tonia Ghent, MD sent at 02/13/2015  8:44 AM EST ----- Notify pt. Osteopenia.  Would consider recheck in a few years. No changes for now, just continue as is.  Thanks.

## 2015-02-13 NOTE — Telephone Encounter (Signed)
Patient informed of Dexa scan results and to recheck in a few years.

## 2015-02-19 ENCOUNTER — Telehealth: Payer: Self-pay

## 2015-02-19 MED ORDER — FLUTICASONE PROPIONATE 50 MCG/ACT NA SUSP: 2.0000 | Freq: Every day | NASAL | Status: DC

## 2015-02-19 NOTE — Telephone Encounter (Signed)
Sent. Thanks.   

## 2015-02-19 NOTE — Telephone Encounter (Signed)
Pt left v/m; pt was seen on 02/10/15 and pt finished zpak that helped a lot;but pt continues with nasal drip; pt used daughters flonase and that helped. Pt request new rx of flonase to [place].Please advise.

## 2015-04-04 NOTE — Discharge Summary (Signed)
PATIENT NAME:  Julie Phillips, Julie Phillips MR#:  [id number] DATE OF BIRTH:  01/19/1932  DATE OF ADMISSION:  09/11/2013 DATE OF DISCHARGE:  09/11/2013  PRIMARY CARE PHYSICIAN: Dr. Elsie Stain at [facility].  GI DOCTOR: Kathrin Ruddy, MD  PRESENTING COMPLAINT: Hypoxia, respiratory distress.   DISCHARGE DIAGNOSES: 1.  Acute respiratory distress due to vomiting.  2.  Hypoxia, resolved.  3.  Chronic atrial fibrillation, on Coumadin.  4. Status post surveillance colonoscopy. Sats at 96% on room air and exertion.   CODE STATUS: FULL CODE.   MEDICATIONS: 1.  MiraLAX p.o. daily.  2.  Aspirin 81 mg daily.  3.  Topicort 0.25%, apply topically to affected area once a day.  4.  Levothyroxine 125 mcg p.o. daily.  5.  Losartan 100 mg p.o. daily.  6.  Lutein/Zeaxanthin 15/0.75, 0.7 mg, 2 capsules p.o. daily.  7.  Metoprolol succinate extended release, 50 mg, one-half tablet b.i.d., which is 25 b.i.d.  8.  K-Dur 20 mEq p.o. daily.  9.  Ranitidine 150 mg b.i.d.  10.  Warfarin 3 mg, 1 tablet every day and 2 mg twice a week.  11.  Vitamin Y40, folic acid p.o. daily.   Follow up with Dr. Elsie Stain in 1 to 2 weeks.   BRIEF SUMMARY OF HOSPITAL COURSE: [name] is a 81 year old pleasant Caucasian female who was admitted on the medical floor after she had developed respiratory distress and hypoxia during her surveillance colonoscopy. She had an episode of vomiting thereafter, became hypoxic, and there was a possibility of aspiration. The patient was admitted on the medical floor where she remained with a normal vital status. She did not have further respiratory distress. She received a couple of breathing treatments. Her sats were 96% on ambulation and exertion on room air prior to discharge. The patient was feeling well better by the evening and requested to go home. Since she is has improved over the hospital course she is being sent home in stable condition.   Chronic atrial fibrillation: Her Coumadin will  be resumed later this evening per GI recommendations.   Gastroesophageal reflux disease: Continue PPI.   Hypothyroidism: Continue Synthroid.   Status post surveillance colonoscopy, per Dr. Thurmond Butts.   Hospital stay otherwise remained stable.   THE PATIENT REMAINED A FULL CODE.   TIME SPENT: 40 minutes   ____________________________ Gus Height A. Posey Pronto, MD sap:dm D: 09/19/2013 14:17:40 ET T: 09/19/2013 15:09:54 ET JOB#: [id number]  cc: Maxwel Meadowcroft A. Posey Pronto, MD, <Dictator> Elveria Rising. Damita Dunnings, MD * Kathrin Ruddy, MD Ilda Basset MD ELECTRONICALLY SIGNED 10/05/2013 14:57

## 2015-04-04 NOTE — H&P (Signed)
PATIENT NAME:  Julie Phillips, ESKRIDGE MR#:  [id number] DATE OF BIRTH:  06-23-1932  DATE OF ADMISSION:  09/11/2013  PRIMARY CARE PHYSICIAN: Dr. Elsie Stain at [facility].   REFERRING PHYSICIAN: Dr. Arther Dames, GI.  ADMITTING PHYSICIAN: Dr. Fritzi Mandes.  REASON FOR ADMISSION: Hypoxia, respiratory distress.   HISTORY OF PRESENT ILLNESS: Julie Phillips is a 81 year old Caucasian female with history of chronic A. fib on Coumadin, hypothyroidism and hypertension, was admitted for surveillance colonoscopy given her history of colon polyps in the past. The patient was sedated per anesthesia and at the end of the procedure, she all of a sudden started coughing up and had an episode of vomiting. Her sats dropped down into the 40s and she immediately was suctioned, bagged and put on oxygen. She also received a breathing treatment. During my evaluation, the patient's sats are 100% on 2 liters. She is denying any trouble breathing as long as she is sitting up and denies any chest pain or any coughing up any phlegm. Her chest x-ray done appears to be within normal limits, mild atelectasis bibasilar. There is no evidence of any infiltrate. The patient is being admitted for acute respiratory distress and hypoxemia status post colonoscopy.   PAST MEDICAL HISTORY: 1.  History of atrial fibrillation, chronic, on Coumadin.  2.  Hypothyroidism.  3.  Hypertension.  4.  Acid reflux  PAST SURGICAL HISTORY: None.   CURRENT MEDICATIONS: 1.  Aspirin 81 mg daily.  2.  Vitamin B12/folic acid 660/630 mcg p.o. daily.  3.  Levothyroxine 125 mcg daily.  4.  Losartan 100 mg daily.  5.  Lutein-zeaxanthin 2 capsules daily.  6.  Metoprolol 50 mg 1/2 tablet daily b.i.d.  7.  Potassium chloride 10 mEq p.o. daily.  8.  Warfarin 7.5 mg once a day for 5 days and 10 mg once a day for 2 days.   ALLERGIES: STATINS.   SOCIAL HISTORY: Drinks 1 alcoholic beverage per month. She does have a remote history of tobacco use.   FAMILY  HISTORY: Breast cancer in her daughter and her sister.   REVIEW OF SYSTEMS:    CONSTITUTIONAL: No fever, fatigue, weakness or weight loss.  EYES: No blurred, double vision, glaucoma or cataracts.  ENT: No tinnitus, ear pain, hearing loss.  RESPIRATORY: Positive for cough, shortness of breath. Notes no hemoptysis or COPD.  CARDIOVASCULAR: No chest pain, orthopnea, edema GASTROINTESTINAL: No nausea, vomiting, diarrhea, abdominal pain or rectal bleeding.  GENITOURINARY: No dysuria, hematuria or frequency.  ENDOCRINE: No polyuria, nocturia or thyroid problems.  HEMATOLOGY: No anemia or easy bruising.  SKIN: No acne or rash.  MUSCULOSKELETAL: No arthritis, swelling or gout.  NEUROLOGIC: No CVA, TIA, vertigo or ataxia.  PSYCHIATRIC: No anxiety or depression. All other systems reviewed and negative.   PHYSICAL EXAMINATION: GENERAL: The patient is awake. She is alert. She is oriented x 3, not in acute distress.  VITAL SIGNS: She is afebrile. Pulse is 78, regular. Blood pressure is 130/58. Her sats are 96% to 100% on 2 liters.  HEENT: Atraumatic, normocephalic. Pupils: PERRLA. EOM intact. Oral mucosa is moist.  NECK: Supple. No JVD. No carotid bruit.  LUNGS: Clear to auscultation bilaterally. No rales, rhonchi, respiratory distress or labored breathing.  CARDIOVASCULAR: Both the heart sounds are normal. Rate, rhythm regular. PMI not lateralized. Chest nontender.  EXTREMITIES: Good pedal pulses, good femoral pulses. No lower extremity edema.  ABDOMEN: Soft, benign, nontender. No organomegaly. Positive bowel sounds.  NEUROLOGIC: Grossly intact cranial nerves II through XII.  No motor or sensory deficits. PSYCHIATRIC: The patient is awake, alert, oriented x 3.   RADIOLOGICAL DATA: Stat chest x-ray shows mildly increased lung markings. which may reflect subsegmental atelectasis or less likely very mild pulmonary vascular congestion. There is no evidence of pneumothorax or pneumomediastinum or focal  pneumonia.   ASSESSMENT AND PLAN: A 81 year old Julie Phillips with history of chronic atrial fibrillation on Coumadin, hypertension and hypothyroidism, who is admitted after she develops respiratory distress, status post colonoscopy. She is being admitted for:  1.  Acute hypoxic respiratory distress: The patient will be admitted on medical floor. We will continue oxygen and wean it down as patient tolerates. DuoNebs around the clock. There is no urgent indication for any steroids or any antibiotics at this time. If patient continues to start wheezing, I will give her a course of steroids. We will continue to monitor vitals for any signs of fever. Status post surveillance colonoscopy. The patient underwent surveillance colonoscopy for her history of colon polyps in the past. She had drank a cup of coffee at 4:30 in the morning along with some TUMS and she vomited at the end of the procedure. Her sats dropped down into the 40s. She was bagged, suctioned and put on nasal cannula oxygen. She is improving. We will give the patient a regular diet at this time and resume her home meds.  2.  Chronic atrial fibrillation: Rate appears to be controlled. Her Coumadin will be resumed later this evening per gastroenterology recommendations.  3.  Gastroesophageal reflux disease: Continue proton pump inhibitor.  4.  Hypothyroidism: Continue Synthroid.  5.  We will ambulate the patient later in the afternoon. We will try to wean her oxygen down as long as she tolerates it. The patient is requesting to go home later this evening. We will reassess her and make that decision if the patient is clinically stable. The above was discussed with Dr. Rayann Heman from gastroenterology.   TIME SPENT: 55 minutes.  ____________________________ Hart Rochester Posey Pronto, MD sap:jm D: 09/11/2013 12:28:18 ET T: 09/11/2013 13:40:56 ET JOB#: [id number]  cc: Rayen Palen A. Posey Pronto, MD, <Dictator> Elveria Rising. Damita Dunnings, MD Ilda Basset MD ELECTRONICALLY SIGNED 09/11/2013  14:26

## 2015-05-26 ENCOUNTER — Ambulatory Visit (INDEPENDENT_AMBULATORY_CARE_PROVIDER_SITE_OTHER): Payer: PPO | Admitting: Cardiovascular Disease

## 2015-05-26 ENCOUNTER — Encounter: Payer: Self-pay | Admitting: Cardiovascular Disease

## 2015-05-26 VITALS — BP 124/78 | HR 79 | Ht 62.0 in | Wt 203.2 lb

## 2015-05-26 DIAGNOSIS — R000 Tachycardia, unspecified: Secondary | ICD-10-CM

## 2015-05-26 DIAGNOSIS — E669 Obesity, unspecified: Secondary | ICD-10-CM

## 2015-05-26 DIAGNOSIS — I480 Paroxysmal atrial fibrillation: Secondary | ICD-10-CM | POA: Diagnosis not present

## 2015-05-26 NOTE — Assessment & Plan Note (Signed)
She denies any tachycardia concerning for arrhythmia. Currently not on anticoagulation

## 2015-05-26 NOTE — Assessment & Plan Note (Signed)
We have encouraged continued exercise, careful diet management in an effort to lose weight. Walks with a walker and cane She is requesting handicapped placard on today's visit

## 2015-05-26 NOTE — Progress Notes (Signed)
Patient ID: Julie Phillips, female    DOB: 23-Jun-1932, 81 y.o.   MRN: [id number]  HPI Comments: Julie Phillips  is a pleasant 81 year old woman with remote history of atrial fibrillation in 2007, low potassium by her report, previously on warfarin, history of hypertension who presents for routine followup of her atrial fibrillation  In follow-up she reports that she has very rare palpitations. As on her prior clinic visit, she takes metoprolol only once per day She walks with a walker, has leg weakness. Otherwise very active, went on a cruise in [place], had a good time Total cholesterol typically around 200. She does not want a statin she feels well with no complaints.  EKG on today's visit shows normal sinus rhythm with rate 79 bpm, nonspecific ST abnormality  Other past medical history She has tried statins in the past and had a problem with her "urine". Details are uncertain  Previous echocardiogram that showed mild MR and TR, normal LV systolic function  Stress test was normal showing no ischemia with normal ejection fraction . Carotid ultrasound showed minimal chronic plaquing .  Allergies  Allergen Reactions  . Statins     Myalgias  . Wheat     Throat swelling  . Amlodipine     hematuria  . Lisinopril     Unrecalled BP med- possibly lisinopril- intolerant, had cough on the medicine- changed to cozaar    Current Outpatient Prescriptions on File Prior to Visit  Medication Sig Dispense Refill  . calcipotriene-betamethasone (TACLONEX SCALP) external suspension Apply topically as needed.     . Calcium Carbonate-Vitamin D (CALTRATE 600+D PO) Take by mouth as needed.     . famotidine (PEPCID) 20 MG tablet Take 20 mg by mouth 2 (two) times daily.    Marland Kitchen ketoconazole (NIZORAL) 2 % cream Apply topically daily.    Marland Kitchen levothyroxine (SYNTHROID, LEVOTHROID) 125 MCG tablet Take 1 tablet (125 mcg total) by mouth daily. Except for 0.5 tab on Sunday 90 tablet 3  . losartan (COZAAR) 100 MG  tablet Take 1 tablet (100 mg total) by mouth daily. 90 tablet 3  . Lutein-Zeaxanthin 15-0.7 MG CAPS Take 2 capsules by mouth daily.    . metoprolol (LOPRESSOR) 50 MG tablet Take 1 tablet (50 mg total) by mouth daily. 90 tablet 3  . Multiple Vitamins-Minerals (VISION FORMULA) TABS Take by mouth daily.    . potassium chloride (K-DUR) 10 MEQ tablet Take 1 tablet (10 mEq total) by mouth daily. 90 tablet 3  . pyridOXINE (VITAMIN B-6) 100 MG tablet Take 100 mg by mouth daily.    Marland Kitchen triamcinolone cream (KENALOG) 0.1 % Apply topically daily.     . vitamin B-12 (CYANOCOBALAMIN) 1000 MCG tablet Take 3,000 mcg by mouth daily.      No current facility-administered medications on file prior to visit.    Past Medical History  Diagnosis Date  . GERD (gastroesophageal reflux disease)   . Hx of colonic polyps   . History of chicken pox   . Hyperlipidemia   . Hypertension   . AF (atrial fibrillation)   . LVH (left ventricular hypertrophy)   . Fall     Past Surgical History  Procedure Laterality Date  . Cardiovascular stress test  2011    normal     Social History  reports that she quit smoking about 43 years ago. Her smoking use included Cigarettes. She has a 40 pack-year smoking history. She has never used smokeless tobacco. She reports that she  does not drink alcohol or use illicit drugs.  Family History family history includes Breast cancer in her daughter and sister; Heart disease in her father and mother. There is no history of Colon cancer.     Review of Systems  Constitutional: Negative.   HENT: Negative.   Eyes: Negative.   Respiratory: Negative.   Cardiovascular: Negative.   Gastrointestinal: Negative.   Musculoskeletal: Positive for gait problem.  Skin: Negative.   Neurological: Negative.   Psychiatric/Behavioral: Negative.   All other systems reviewed and are negative.   BP 124/78 mmHg  Pulse 79  Ht 5\' 2"  (1.575 m)  Wt 203 lb 4 oz (92.194 kg)  BMI 37.17  kg/m2  Physical Exam  Constitutional: She is oriented to person, place, and time. She appears well-developed and well-nourished.  Obese  HENT:  Head: Normocephalic.  Nose: Nose normal.  Mouth/Throat: Oropharynx is clear and moist.  Eyes: Conjunctivae are normal. Pupils are equal, round, and reactive to light.  Neck: Normal range of motion. Neck supple. No JVD present.  Cardiovascular: Normal rate, regular rhythm, S1 normal, S2 normal, normal heart sounds and intact distal pulses.  Exam reveals no gallop and no friction rub.   No murmur heard. Pulmonary/Chest: Effort normal and breath sounds normal. No respiratory distress. She has no wheezes. She has no rales. She exhibits no tenderness.  Abdominal: Soft. Bowel sounds are normal. She exhibits no distension. There is no tenderness.  Musculoskeletal: Normal range of motion. She exhibits no edema or tenderness.  Lymphadenopathy:    She has no cervical adenopathy.  Neurological: She is alert and oriented to person, place, and time. Coordination normal.  Skin: Skin is warm and dry. No erythema.  Psychiatric: She has a normal mood and affect. Her behavior is normal. Judgment and thought content normal.    Assessment and Plan  Nursing note and vitals reviewed.

## 2015-05-26 NOTE — Patient Instructions (Signed)
You are doing well. No medication changes were made.  Please call us if you have new issues that need to be addressed before your next appt.  Your physician wants you to follow-up in: 12 months.  You will receive a reminder letter in the mail two months in advance. If you don't receive a letter, please call our office to schedule the follow-up appointment. 

## 2015-09-25 ENCOUNTER — Ambulatory Visit (INDEPENDENT_AMBULATORY_CARE_PROVIDER_SITE_OTHER): Payer: PPO

## 2015-09-25 DIAGNOSIS — Z23 Encounter for immunization: Secondary | ICD-10-CM

## 2015-12-09 ENCOUNTER — Ambulatory Visit (INDEPENDENT_AMBULATORY_CARE_PROVIDER_SITE_OTHER): Payer: PPO | Admitting: Family Medicine

## 2015-12-09 ENCOUNTER — Encounter: Payer: Self-pay | Admitting: Family Medicine

## 2015-12-09 VITALS — BP 124/72 | HR 72 | Temp 97.9°F | Wt 226.5 lb

## 2015-12-09 DIAGNOSIS — R0602 Shortness of breath: Secondary | ICD-10-CM | POA: Diagnosis not present

## 2015-12-09 DIAGNOSIS — M67912 Unspecified disorder of synovium and tendon, left shoulder: Secondary | ICD-10-CM | POA: Diagnosis not present

## 2015-12-09 MED ORDER — LOSARTAN POTASSIUM 100 MG PO TABS: 100.0000 mg | ORAL_TABLET | Freq: Every day | ORAL | Status: DC

## 2015-12-09 MED ORDER — LUTEIN-ZEAXANTHIN 15-0.7 MG PO CAPS: ORAL_CAPSULE | ORAL | Status: DC

## 2015-12-09 MED ORDER — LEVOTHYROXINE SODIUM 125 MCG PO TABS: 125.0000 ug | ORAL_TABLET | Freq: Every day | ORAL | Status: DC

## 2015-12-09 MED ORDER — POTASSIUM CHLORIDE ER 10 MEQ PO TBCR: 10.0000 meq | EXTENDED_RELEASE_TABLET | Freq: Every day | ORAL | Status: DC

## 2015-12-09 MED ORDER — METOPROLOL TARTRATE 50 MG PO TABS: 50.0000 mg | ORAL_TABLET | Freq: Every day | ORAL | Status: DC

## 2015-12-09 MED ORDER — LORATADINE 10 MG PO TABS: 10.0000 mg | ORAL_TABLET | Freq: Every day | ORAL | Status: DC

## 2015-12-09 NOTE — Progress Notes (Signed)
Pre visit review using our clinic review tool, if applicable. No additional management support is needed unless otherwise documented below in the visit note.  She had fallen about 1.5 months ago.  She eventually got back up.  No LOC.  About 1 week later she had more B shoulder pain, with ROM.  She had gone to massage therapy in the meantime, with some relief of R but not L shoulder pain.  Still with pain moving L arm.  She can't hook a bra now posteriorly.  Pain sleeping on L side.    She needed refills on her meds, she'll schedule CPE.  D/w pt.    Had some episodic SOB without exertion.  None currently.  Clearly better after it rains.  Likely allergen exposure at home, per patient report.  No wheeze.  No CP.  She isn't really stuffy.  Advair had helped prev.  We talked about beta agonist and AF risk.  Not on med currently.    Meds, vitals, and allergies reviewed.   ROS: See HPI.  Otherwise, noncontributory.  nad ncat Mmm Op wnl Neck supple, no LA rrr Ctab, no wheeze, no inc in WOB.  L shoulder with pain on AROM, no pain on PROM.  Pain with AROM above her head Pain with int/ext rotation.  Pain with supraspinatus testing. + scap assist with no pain on ext rotation.  No arm drop Distally NV intact.

## 2015-12-09 NOTE — Patient Instructions (Signed)
Take up to 600mg  of ibuprofen up to 3 times a day with food.  Taper down as pain allows.  Use the exercises in the meantime.  Update [place] as needed.  We may need to get you to see Dr. Lorelei Pont.  Try taking 10mg  of plain claritin a day.  Glad to see you.

## 2015-12-10 DIAGNOSIS — R0602 Shortness of breath: Secondary | ICD-10-CM | POA: Insufficient documentation

## 2015-12-10 DIAGNOSIS — M67919 Unspecified disorder of synovium and tendon, unspecified shoulder: Secondary | ICD-10-CM | POA: Insufficient documentation

## 2015-12-10 NOTE — Assessment & Plan Note (Signed)
Likely an allergy trigger this time of year/at home. Okay to try claritin 10mg  a day and update me as needed.  We wanted to avoid beta agonists if possible.  She agrees.  No sx now, clearly okay for outpatient f/u.  No sign of cardiac or emergent pulmonary source for her sx.  She agrees.

## 2015-12-10 NOTE — Assessment & Plan Note (Signed)
Likely strain/irritation.  D/w pt about home exercises and anatomy, handout d/w pt, demonstrated.  Ibuprofen with caution, see AVS.  Update [place] prn.  She agrees.

## 2016-04-03 ENCOUNTER — Telehealth: Payer: Self-pay | Admitting: Family Medicine

## 2016-04-03 NOTE — Telephone Encounter (Signed)
LM for pt to sch AWV, mn ° °

## 2016-05-27 ENCOUNTER — Encounter (INDEPENDENT_AMBULATORY_CARE_PROVIDER_SITE_OTHER): Payer: Self-pay

## 2016-05-27 ENCOUNTER — Encounter: Payer: Self-pay | Admitting: Cardiovascular Disease

## 2016-05-27 ENCOUNTER — Ambulatory Visit (INDEPENDENT_AMBULATORY_CARE_PROVIDER_SITE_OTHER): Payer: PPO | Admitting: Cardiovascular Disease

## 2016-05-27 VITALS — BP 140/74 | HR 83 | Ht 62.0 in | Wt 229.4 lb

## 2016-05-27 DIAGNOSIS — I480 Paroxysmal atrial fibrillation: Secondary | ICD-10-CM | POA: Diagnosis not present

## 2016-05-27 DIAGNOSIS — I10 Essential (primary) hypertension: Secondary | ICD-10-CM | POA: Diagnosis not present

## 2016-05-27 DIAGNOSIS — E669 Obesity, unspecified: Secondary | ICD-10-CM

## 2016-05-27 DIAGNOSIS — E785 Hyperlipidemia, unspecified: Secondary | ICD-10-CM

## 2016-05-27 NOTE — Progress Notes (Signed)
Patient ID: Julie Phillips, female   DOB: May 25, 1932, 82 y.o.   MRN: [id number] Cardiology Office Note  Date:  05/27/2016   ID:  Julie Phillips, DOB 08-03-1932, MRN [id number]  PCP:  Julie Stain, MD   Chief Complaint  Patient presents with  . Atrial Fibrillation    some SOB with activity, per pt    HPI:  Julie Phillips is a pleasant 82 year old woman with remote history of atrial fibrillation in 2007, low potassium by her report, previously on warfarin, history of hypertension who presents for routine followup of her atrial fibrillation  In follow-up she reports That she is doing well, continues to take metoprolol once a day She walks with a walker, has leg weakness. Recent trip to see family in [place] in [place]  Previously went on a cruise in [place], had a good time Total cholesterol typically around 200. She does not want a statin she feels well with no complaints.  EKG on today's visit shows normal sinus rhythm with rate 83 bpm, nonspecific ST abnormality  Other past medical history She has tried statins in the past and had a problem with her "urine". Details are uncertain  Previous echocardiogram that showed mild MR and TR, normal LV systolic function  Stress test was normal showing no ischemia with normal ejection fraction . Carotid ultrasound showed minimal chronic plaquing  PMH:   has a past medical history of GERD (gastroesophageal reflux disease); colonic polyps; History of chicken pox; Hyperlipidemia; Hypertension; AF (atrial fibrillation) ([place]); LVH (left ventricular hypertrophy); and Fall.  PSH:    Past Surgical History  Procedure Laterality Date  . Cardiovascular stress test  2011    normal     Current Outpatient Prescriptions  Medication Sig Dispense Refill  . calcipotriene-betamethasone (TACLONEX SCALP) external suspension Apply topically as needed.     . Calcium Carbonate-Vitamin D (CALTRATE 600+D PO) Take by mouth as needed.     . Cyanocobalamin  (VITAMIN B-12 PO) Take 2,500 mg by mouth daily.    . famotidine (PEPCID) 20 MG tablet Take 20 mg by mouth 2 (two) times daily.    Marland Kitchen ketoconazole (NIZORAL) 2 % cream Apply topically daily.    Marland Kitchen levothyroxine (SYNTHROID, LEVOTHROID) 125 MCG tablet Take 1 tablet (125 mcg total) by mouth daily. Except for 0.5 tab on Sunday 90 tablet 3  . loratadine (CLARITIN) 10 MG tablet Take 1 tablet (10 mg total) by mouth daily.    Marland Kitchen losartan (COZAAR) 100 MG tablet Take 1 tablet (100 mg total) by mouth daily. 90 tablet 3  . Lutein-Zeaxanthin 15-0.7 MG CAPS Taking 20mg  in AM and 35mg  in PM of lutein    . metoprolol (LOPRESSOR) 50 MG tablet Take 1 tablet (50 mg total) by mouth daily. 90 tablet 3  . Multiple Vitamins-Minerals (VISION FORMULA) TABS Take by mouth daily.    . potassium chloride (K-DUR) 10 MEQ tablet Take 1 tablet (10 mEq total) by mouth daily. 90 tablet 3  . pyridOXINE (VITAMIN B-6) 100 MG tablet Take 100 mg by mouth daily.    Marland Kitchen triamcinolone cream (KENALOG) 0.1 % Apply topically daily.      No current facility-administered medications for this visit.     Allergies:   Statins; Wheat; Amlodipine; and Lisinopril   Social History:  The patient  reports that she quit smoking about 44 years ago. Her smoking use included Cigarettes. She has a 40 pack-year smoking history. She has never used smokeless tobacco. She reports that she does not  drink alcohol or use illicit drugs.   Family History:   family history includes Breast cancer in her daughter and sister; Heart disease in her father and mother. There is no history of Colon cancer.    Review of Systems: Review of Systems  Constitutional: Negative.   Respiratory: Negative.   Cardiovascular: Negative.   Gastrointestinal: Negative.   Musculoskeletal: Negative.        Gait instability  Neurological: Negative.   Psychiatric/Behavioral: Negative.   All other systems reviewed and are negative.    PHYSICAL EXAM: VS:  BP 150/74 mmHg  Pulse 83   Ht 5\' 2"  (1.575 m)  Wt 229 lb 6.4 oz (104.055 kg)  BMI 41.95 kg/m2  SpO2 94% , BMI Body mass index is 41.95 kg/(m^2). GEN: Well nourished, well developed, in no acute distress HEENT: normal Neck: no JVD, carotid bruits, or masses Cardiac: RRR; no murmurs, rubs, or gallops,no edema  Respiratory:  clear to auscultation bilaterally, normal work of breathing GI: soft, nontender, nondistended, + BS MS: no deformity or atrophy Skin: warm and dry, no rash Neuro:  Strength and sensation are intact Psych: euthymic mood, full affect    Recent Labs: No results found for requested labs within last 365 days.    Lipid Panel Lab Results  Component Value Date   CHOL 207* 01/09/2015   HDL 56.10 01/09/2015   LDLCALC 136* 01/09/2015   TRIG 73.0 01/09/2015      Wt Readings from Last 3 Encounters:  05/27/16 229 lb 6.4 oz (104.055 kg)  12/09/15 226 lb 8 oz (102.74 kg)  05/26/15 203 lb 4 oz (92.194 kg)       ASSESSMENT AND PLAN:  Paroxysmal atrial fibrillation ([place]) - Plan: EKG 12-Lead Denies any symptoms concerning for atrial fibrillation, only rare palpitation  HLD (hyperlipidemia) Does not want a statin, most recent cholesterol around 200  Essential hypertension Initial blood pressure was elevated, improved on recheck Previous blood pressures well controlled, will monitor for now  Obesity We have encouraged continued exercise, careful diet management in an effort to lose weight.    Total encounter time more than 15 minutes  Greater than 50% was spent in counseling and coordination of care with the patient   Disposition:   F/U  12 months   Orders Placed This Encounter  Procedures  . EKG 12-Lead     Signed, Esmond Plants, M.D., Ph.D. 05/27/2016  [place], [place]

## 2016-05-27 NOTE — Patient Instructions (Signed)
You are doing well. No medication changes were made.  Please call us if you have new issues that need to be addressed before your next appt.  Your physician wants you to follow-up in: 12 months.  You will receive a reminder letter in the mail two months in advance. If you don't receive a letter, please call our office to schedule the follow-up appointment. 

## 2016-05-31 DIAGNOSIS — Z1283 Encounter for screening for malignant neoplasm of skin: Secondary | ICD-10-CM | POA: Diagnosis not present

## 2016-05-31 DIAGNOSIS — D485 Neoplasm of uncertain behavior of skin: Secondary | ICD-10-CM | POA: Diagnosis not present

## 2016-05-31 DIAGNOSIS — L821 Other seborrheic keratosis: Secondary | ICD-10-CM | POA: Diagnosis not present

## 2016-05-31 DIAGNOSIS — R21 Rash and other nonspecific skin eruption: Secondary | ICD-10-CM | POA: Diagnosis not present

## 2016-05-31 DIAGNOSIS — L280 Lichen simplex chronicus: Secondary | ICD-10-CM | POA: Diagnosis not present

## 2016-05-31 DIAGNOSIS — L578 Other skin changes due to chronic exposure to nonionizing radiation: Secondary | ICD-10-CM | POA: Diagnosis not present

## 2016-05-31 DIAGNOSIS — D1800 Hemangioma unspecified site: Secondary | ICD-10-CM | POA: Diagnosis not present

## 2016-05-31 DIAGNOSIS — L812 Freckles: Secondary | ICD-10-CM | POA: Diagnosis not present

## 2016-05-31 DIAGNOSIS — D229 Melanocytic nevi, unspecified: Secondary | ICD-10-CM | POA: Diagnosis not present

## 2016-06-15 ENCOUNTER — Other Ambulatory Visit: Payer: Self-pay | Admitting: Family Medicine

## 2016-06-15 DIAGNOSIS — I10 Essential (primary) hypertension: Secondary | ICD-10-CM

## 2016-06-17 ENCOUNTER — Other Ambulatory Visit (INDEPENDENT_AMBULATORY_CARE_PROVIDER_SITE_OTHER): Payer: PPO

## 2016-06-17 ENCOUNTER — Ambulatory Visit (INDEPENDENT_AMBULATORY_CARE_PROVIDER_SITE_OTHER): Payer: PPO

## 2016-06-17 DIAGNOSIS — Z0000 Encounter for general adult medical examination without abnormal findings: Secondary | ICD-10-CM

## 2016-06-17 DIAGNOSIS — I10 Essential (primary) hypertension: Secondary | ICD-10-CM

## 2016-06-17 LAB — COMPREHENSIVE METABOLIC PANEL
ALK PHOS: 94 U/L (ref 39–117)
ALT: 9 U/L (ref 0–35)
AST: 15 U/L (ref 0–37)
Albumin: 3.9 g/dL (ref 3.5–5.2)
BUN: 11 mg/dL (ref 6–23)
CHLORIDE: 105 meq/L (ref 96–112)
CO2: 29 meq/L (ref 19–32)
Calcium: 9.8 mg/dL (ref 8.4–10.5)
Creatinine, Ser: 0.7 mg/dL (ref 0.40–1.20)
GFR: 85.21 mL/min (ref >60.00)
GLUCOSE: 92 mg/dL (ref 70–99)
POTASSIUM: 4.1 meq/L (ref 3.5–5.1)
SODIUM: 139 meq/L (ref 135–145)
TOTAL PROTEIN: 7.4 g/dL (ref 6.0–8.3)
Total Bilirubin: 0.6 mg/dL (ref 0.2–1.2)

## 2016-06-17 LAB — TSH: TSH: 0.66 u[IU]/mL (ref 0.35–4.50)

## 2016-06-17 LAB — LIPID PANEL
CHOL/HDL RATIO: 4
Cholesterol: 215 mg/dL — ABNORMAL HIGH (ref 0–200)
HDL: 49.3 mg/dL (ref >39.00)
LDL CALC: 149 mg/dL — AB (ref 0–99)
NONHDL: 165.66
Triglycerides: 82 mg/dL (ref 0.0–149.0)
VLDL: 16.4 mg/dL (ref 0.0–40.0)

## 2016-06-17 NOTE — Progress Notes (Signed)
Subjective:   Julie Phillips is a 82 y.o. female who presents for Medicare Annual (Subsequent) preventive examination.  Review of Systems:  N/A Cardiac Risk Factors include: advanced age (>55men, >78 women);obesity (BMI >30kg/m2);dyslipidemia;hypertension     Objective:     Vitals: BP 118/68 mmHg  Pulse 68  Temp(Src) 98.2 F (36.8 C) (Oral)  Ht 5\' 1"  (1.549 m)  Wt 226 lb 8 oz (102.74 kg)  BMI 42.82 kg/m2  SpO2 94%  Body mass index is 42.82 kg/(m^2).   Tobacco History  Smoking status  . Former Smoker -- 2.00 packs/day for 20 years  . Types: Cigarettes  . Quit date: 12/14/1971  Smokeless tobacco  . Never Used     Counseling given: No   Past Medical History  Diagnosis Date  . GERD (gastroesophageal reflux disease)   . Hx of colonic polyps   . History of chicken pox   . Hyperlipidemia   . Hypertension   . AF (atrial fibrillation) ([place])   . LVH (left ventricular hypertrophy)   . Fall    Past Surgical History  Procedure Laterality Date  . Cardiovascular stress test  2011    normal    Family History  Problem Relation Age of Onset  . Heart disease Mother   . Heart disease Father   . Breast cancer Sister   . Breast cancer Daughter   . Colon cancer Neg Hx    History  Sexual Activity  . Sexual Activity: No    Outpatient Encounter Prescriptions as of 06/17/2016  Medication Sig  . Calcium Carbonate-Vitamin D (CALTRATE 600+D PO) Take by mouth as needed.   . Cyanocobalamin (VITAMIN B-12 PO) Take 2,500 mg by mouth daily.  . famotidine (PEPCID) 20 MG tablet Take 20 mg by mouth 2 (two) times daily.  Marland Kitchen levothyroxine (SYNTHROID, LEVOTHROID) 125 MCG tablet Take 1 tablet (125 mcg total) by mouth daily. Except for 0.5 tab on Sunday  . loratadine (CLARITIN) 10 MG tablet Take 1 tablet (10 mg total) by mouth daily. (Patient taking differently: Take 10 mg by mouth daily as needed. )  . losartan (COZAAR) 100 MG tablet Take 1 tablet (100 mg total) by mouth daily.  .  Lutein-Zeaxanthin 15-0.7 MG CAPS Taking 20mg  in AM and 35mg  in PM of lutein  . metoprolol (LOPRESSOR) 50 MG tablet Take 1 tablet (50 mg total) by mouth daily.  . Multiple Vitamins-Minerals (VISION FORMULA) TABS Take by mouth daily.  . potassium chloride (K-DUR) 10 MEQ tablet Take 1 tablet (10 mEq total) by mouth daily.  Marland Kitchen pyridOXINE (VITAMIN B-6) 100 MG tablet Take 100 mg by mouth daily.  . [DISCONTINUED] calcipotriene-betamethasone (TACLONEX SCALP) external suspension Apply topically as needed.   . [DISCONTINUED] ketoconazole (NIZORAL) 2 % cream Apply topically daily.  . [DISCONTINUED] triamcinolone cream (KENALOG) 0.1 % Apply topically daily.    No facility-administered encounter medications on file as of 06/17/2016.    Activities of Daily Living In your present state of health, do you have any difficulty performing the following activities: 06/17/2016  Hearing? N  Vision? N  Difficulty concentrating or making decisions? N  Walking or climbing stairs? N  Dressing or bathing? N  Doing errands, shopping? N  Preparing Food and eating ? N  Using the Toilet? N  In the past six months, have you accidently leaked urine? N  Do you have problems with loss of bowel control? N  Managing your Medications? N  Managing your Finances? N  Housekeeping or managing  your Housekeeping? N    Patient Care Team: Tonia Ghent, MD as PCP - General (Family Medicine) Minna Merritts, MD as Consulting Physician (Cardiology)    Assessment:     Hearing Screening   125Hz  250Hz  500Hz  1000Hz  2000Hz  4000Hz  8000Hz   Right ear:   0 0 0 0   Left ear:   0 0 0 0   Vision Screening Comments: Last vision exam in 2016 at [facility] and Dietary recommendations Current Exercise Habits: Structured exercise class, Type of exercise: stretching;strength training/weights, Time (Minutes): 45, Frequency (Times/Week): 2, Weekly Exercise (Minutes/Week): 90, Intensity: Moderate, Exercise limited by:  None identified  Goals    . Increase physical activity     Starting 06/17/16, I will continue to do stretching exercises at least twice weekly to help with stability.       Fall Risk Fall Risk  06/17/2016 01/14/2015 01/03/2014 12/26/2012  Falls in the past year? Yes Yes No Yes  Number falls in past yr: 2 or more - - 1  Injury with Fall? - - - Yes  Risk Factor Category  High Fall Risk - - -  Risk for fall due to : Impaired mobility;Impaired balance/gait - - -  Follow up Falls evaluation completed;Education provided - - -   Depression Screen PHQ 2/9 Scores 06/17/2016 01/14/2015 01/03/2014 12/26/2012  PHQ - 2 Score 0 0 0 0     Cognitive Testing MMSE - Mini Mental State Exam 06/17/2016  Orientation to time 5  Orientation to Place 5  Registration 3  Attention/ Calculation 0  Recall 3  Language- name 2 objects 0  Language- repeat 1  Language- follow 3 step command 3  Language- read & follow direction 0  Write a sentence 0  Copy design 0  Total score 20   PLEASE NOTE: A Mini-Cog screen was completed. Maximum score is 20. A value of 0 denotes this part of [facility] MMSE was not completed or the patient failed this part of the Mini-Cog screening.   Mini-Cog Screening Orientation to Time - Max 5 pts Orientation to Place - Max 5 pts Registration - Max 3 pts Recall - Max 3 pts Language Repeat - Max 1 pts Language Follow 3 Step Command - Max 3 pts  Immunization History  Administered Date(s) Administered  . Influenza Split 10/04/2012  . Influenza,inj,Quad PF,36+ Mos 10/08/2013, 09/25/2015  . Influenza-Unspecified 09/13/2011, 08/13/2014  . Pneumococcal Conjugate-13 08/13/2014  . Pneumococcal Polysaccharide-23 12/13/2010  . Td 12/13/2010   Screening Tests Health Maintenance  Topic Date Due  . INFLUENZA VACCINE  07/13/2016  . TETANUS/TDAP  12/13/2020  . DEXA SCAN  Completed  . ZOSTAVAX  Completed  . PNA vac Low Risk Adult  Completed      Plan:     I have personally reviewed and  addressed the Medicare Annual Wellness questionnaire and have noted the following in the patient's chart:  A. Medical and social history B. Use of alcohol, tobacco or illicit drugs  C. Current medications and supplements D. Functional ability and status E.  Nutritional status F.  Physical activity G. Advance directives H. List of other physicians I.  Hospitalizations, surgeries, and ER visits in previous 12 months J.  [place] to include hearing, vision, cognitive, depression L. Referrals and appointments - none  In addition, I have reviewed and discussed with patient certain preventive protocols, quality metrics, and best practice recommendations. A written personalized care plan for preventive services as well  as general preventive health recommendations were provided to patient.  See attached scanned questionnaire for additional information.   Signed,   Lindell Noe, MHA, BS, LPN Health Advisor

## 2016-06-17 NOTE — Progress Notes (Signed)
PCP notes:  Health maintenance: No gaps identified or addressed  Abnormal screenings:   Hearing - failed Fall risk - hx of multiple falls without injury  Patient concerns: None   Nurse concerns: None   Next PCP appt: 06/21/16 @ 0945  I reviewed health advisor's note, was available for consultation on the day of service listed in this note, and agree with documentation and plan. Elsie Stain, MD.

## 2016-06-17 NOTE — Patient Instructions (Addendum)
Julie Phillips , Thank you for taking time to come for your Medicare Wellness Visit. I appreciate your ongoing commitment to your health goals. Please review the following plan we discussed and let me know if I can assist you in the future.   These are the goals we discussed: Goals    . Increase physical activity     Starting 06/17/16, I will continue to do stretching exercises at least twice weekly to help with stability.        This is a list of the screening recommended for you and due dates:  Health Maintenance  Topic Date Due  . Flu Shot  07/13/2016  . Tetanus Vaccine  12/13/2020  . DEXA scan (bone density measurement)  Completed  . Shingles Vaccine  Completed  . Pneumonia vaccines  Completed    Preventive Care for Adults  A healthy lifestyle and preventive care can promote health and wellness. Preventive health guidelines for adults include the following key practices.  . A routine yearly physical is a good way to check with your health care provider about your health and preventive screening. It is a chance to share any concerns and updates on your health and to receive a thorough exam.  . Visit your dentist for a routine exam and preventive care every 6 months. Brush your teeth twice a day and floss once a day. Good oral hygiene prevents tooth decay and gum disease.  . The frequency of eye exams is based on your age, health, family medical history, use  of contact lenses, and other factors. Follow your health care provider's ecommendations for frequency of eye exams.  . Eat a healthy diet. Foods like vegetables, fruits, whole grains, low-fat dairy products, and lean protein foods contain the nutrients you need without too many calories. Decrease your intake of foods high in solid fats, added sugars, and salt. Eat the right amount of calories for you. Get information about a proper diet from your health care provider, if necessary.  . Regular physical exercise is one of the most  important things you can do for your health. Most adults should get at least 150 minutes of moderate-intensity exercise (any activity that increases your heart rate and causes you to sweat) each week. In addition, most adults need muscle-strengthening exercises on 2 or more days a week.  Silver Sneakers may be a benefit available to you. To determine eligibility, you may visit the website: www.silversneakers.com or contact program at 984-645-0336 Mon-Fri between 8AM-8PM.   . Maintain a healthy weight. The body mass index (BMI) is a screening tool to identify possible weight problems. It provides an estimate of body fat based on height and weight. Your health care provider can find your BMI and can help you achieve or maintain a healthy weight.   For adults 20 years and older: ? A BMI below 18.5 is considered underweight. ? A BMI of 18.5 to 24.9 is normal. ? A BMI of 25 to 29.9 is considered overweight. ? A BMI of 30 and above is considered obese.   . Maintain normal blood lipids and cholesterol levels by exercising and minimizing your intake of saturated fat. Eat a balanced diet with plenty of fruit and vegetables. Blood tests for lipids and cholesterol should begin at age 58 and be repeated every 5 years. If your lipid or cholesterol levels are high, you are over 50, or you are at high risk for heart disease, you may need your cholesterol levels checked more frequently.  Ongoing high lipid and cholesterol levels should be treated with medicines if diet and exercise are not working.  . If you smoke, find out from your health care provider how to quit. If you do not use tobacco, please do not start.  . If you choose to drink alcohol, please do not consume more than 2 drinks per day. One drink is considered to be 12 ounces (355 mL) of beer, 5 ounces (148 mL) of wine, or 1.5 ounces (44 mL) of liquor.  . If you are 77-11 years old, ask your health care provider if you should take aspirin to prevent  strokes.  . Use sunscreen. Apply sunscreen liberally and repeatedly throughout the day. You should seek shade when your shadow is shorter than you. Protect yourself by wearing long sleeves, pants, a wide-brimmed hat, and sunglasses year round, whenever you are outdoors.  . Once a month, do a whole body skin exam, using a mirror to look at the skin on your back. Tell your health care provider of new moles, moles that have irregular borders, moles that are larger than a pencil eraser, or moles that have changed in shape or color.     Fall Prevention in the Home  Falls can cause injuries. They can happen to people of all ages. There are many things you can do to make your home safe and to help prevent falls.  WHAT CAN I DO ON THE OUTSIDE OF MY HOME?  Regularly fix the edges of walkways and driveways and fix any cracks.  Remove anything that might make you trip as you walk through a door, such as a raised step or threshold.  Trim any bushes or trees on the path to your home.  Use bright outdoor lighting.  Clear any walking paths of anything that might make someone trip, such as rocks or tools.  Regularly check to see if handrails are loose or broken. Make sure that both sides of any steps have handrails.  Any raised decks and porches should have guardrails on the edges.  Have any leaves, snow, or ice cleared regularly.  Use sand or salt on walking paths during winter.  Clean up any spills in your garage right away. This includes oil or grease spills. WHAT CAN I DO IN THE BATHROOM?   Use night lights.  Install grab bars by the toilet and in the tub and shower. Do not use towel bars as grab bars.  Use non-skid mats or decals in the tub or shower.  If you need to sit down in the shower, use a plastic, non-slip stool.  Keep the floor dry. Clean up any water that spills on the floor as soon as it happens.  Remove soap buildup in the tub or shower regularly.  Attach bath mats  securely with double-sided non-slip rug tape.  Do not have throw rugs and other things on the floor that can make you trip. WHAT CAN I DO IN THE BEDROOM?  Use night lights.  Make sure that you have a light by your bed that is easy to reach.  Do not use any sheets or blankets that are too big for your bed. They should not hang down onto the floor.  Have a firm chair that has side arms. You can use this for support while you get dressed.  Do not have throw rugs and other things on the floor that can make you trip. WHAT CAN I DO IN THE KITCHEN?  Clean up any  spills right away.  Avoid walking on wet floors.  Keep items that you use a lot in easy-to-reach places.  If you need to reach something above you, use a strong step stool that has a grab bar.  Keep electrical cords out of the way.  Do not use floor polish or wax that makes floors slippery. If you must use wax, use non-skid floor wax.  Do not have throw rugs and other things on the floor that can make you trip. WHAT CAN I DO WITH MY STAIRS?  Do not leave any items on the stairs.  Make sure that there are handrails on both sides of the stairs and use them. Fix handrails that are broken or loose. Make sure that handrails are as long as the stairways.  Check any carpeting to make sure that it is firmly attached to the stairs. Fix any carpet that is loose or worn.  Avoid having throw rugs at the top or bottom of the stairs. If you do have throw rugs, attach them to the floor with carpet tape.  Make sure that you have a light switch at the top of the stairs and the bottom of the stairs. If you do not have them, ask someone to add them for you. WHAT ELSE CAN I DO TO HELP PREVENT FALLS?  Wear shoes that:  Do not have high heels.  Have rubber bottoms.  Are comfortable and fit you well.  Are closed at the toe. Do not wear sandals.  If you use a stepladder:  Make sure that it is fully opened. Do not climb a closed  stepladder.  Make sure that both sides of the stepladder are locked into place.  Ask someone to hold it for you, if possible.  Clearly mark and make sure that you can see:  Any grab bars or handrails.  First and last steps.  Where the edge of each step is.  Use tools that help you move around (mobility aids) if they are needed. These include:  Canes.  Walkers.  Scooters.  Crutches.  Turn on the lights when you go into a dark area. Replace any light bulbs as soon as they burn out.  Set up your furniture so you have a clear path. Avoid moving your furniture around.  If any of your floors are uneven, fix them.  If there are any pets around you, be aware of where they are.  Review your medicines with your doctor. Some medicines can make you feel dizzy. This can increase your chance of falling. Ask your doctor what other things that you can do to help prevent falls.   This information is not intended to replace advice given to you by your health care provider. Make sure you discuss any questions you have with your health care provider.   Document Released: 09/25/2009 Document Revised: 04/15/2015 Document Reviewed: 01/03/2015 Elsevier Interactive Patient Education [employer].

## 2016-06-17 NOTE — Progress Notes (Signed)
Pre visit review using our clinic review tool, if applicable. No additional management support is needed unless otherwise documented below in the visit note. 

## 2016-06-21 ENCOUNTER — Ambulatory Visit (INDEPENDENT_AMBULATORY_CARE_PROVIDER_SITE_OTHER): Payer: PPO | Admitting: Family Medicine

## 2016-06-21 ENCOUNTER — Encounter: Payer: Self-pay | Admitting: Family Medicine

## 2016-06-21 VITALS — BP 126/72 | HR 73 | Temp 98.4°F | Ht 62.0 in | Wt 229.5 lb

## 2016-06-21 DIAGNOSIS — E785 Hyperlipidemia, unspecified: Secondary | ICD-10-CM

## 2016-06-21 DIAGNOSIS — E038 Other specified hypothyroidism: Secondary | ICD-10-CM | POA: Diagnosis not present

## 2016-06-21 DIAGNOSIS — I10 Essential (primary) hypertension: Secondary | ICD-10-CM | POA: Diagnosis not present

## 2016-06-21 DIAGNOSIS — I480 Paroxysmal atrial fibrillation: Secondary | ICD-10-CM

## 2016-06-21 DIAGNOSIS — Z9181 History of falling: Secondary | ICD-10-CM | POA: Insufficient documentation

## 2016-06-21 NOTE — Assessment & Plan Note (Signed)
tsh wnl, no tmg on exam.  Continue as is.  D/w pt.  She agrees.

## 2016-06-21 NOTE — Assessment & Plan Note (Signed)
Statin intolerant, continue work on diet and exercise, d/w pt about labs.  She agrees.

## 2016-06-21 NOTE — Progress Notes (Signed)
Pre visit review using our clinic review tool, if applicable. No additional management support is needed unless otherwise documented below in the visit note.  Fall risk d/w pt.  She is going to move into an apartment at a complex for seniors locally, this is pending.  Last fell about 4 months ago, d/w pt about cautions.  It usually happens in the house, she tripped over a vacuum cord- she quit vacuuming in the meantime.  She pulled up the throw rugs and tried to make the house more safe.    Hearing d/w pt.  Declined hearing aids.    Elevated Cholesterol: Statin intolerant.   Labs d/w pt.   No h/o CAD or CABG.  D/w pt.   Diet d/w pt, she is working on that.  Exercise twice a week at the senior center.    Hypertension:    Using medication without problems or lightheadedness: yes Chest pain with exertion:no Edema:no Short of breath:no  No sx of AF, d/w pt.    Advance directive- daughter Laurita Quint if patient were incapacitated.  Hypothyroidism.  No neck masses.  No dysphagia.  Energy level is good per patient, "for an 82 year old lady."    DXA 2016.   Mammogram declined.  D/w pt.    Meds, vitals, and allergies reviewed.   PMH and SH reviewed  ROS: Per HPI unless specifically indicated in ROS section   GEN: nad, alert and oriented HEENT: mucous membranes moist NECK: supple w/o LA CV: rrr. PULM: ctab, no inc wob ABD: soft, +bs EXT: no edema SKIN: no acute rash

## 2016-06-21 NOTE — Assessment & Plan Note (Signed)
Controlled, continue as is.  Labs d/w pt.  

## 2016-06-21 NOTE — Assessment & Plan Note (Signed)
Hx of, but no sx now.  No recent events.  Continue BB.  RRR on exam.

## 2016-06-21 NOTE — Assessment & Plan Note (Signed)
See above.  She is going to move into an apartment at a complex for seniors locally, this is pending.  Last fell about 4 months ago, d/w pt about cautions.  It usually happens in the house, she tripped over a vacuum cord- she quit vacuuming in the meantime.  She pulled up the throw rugs and tried to make the house more safe.  Continue as is for now.

## 2016-06-21 NOTE — Patient Instructions (Signed)
Take care.  Glad to see you.  Update me as needed.   Recheck in about 1 year.   Let us know when you need refills.

## 2016-07-12 DIAGNOSIS — L821 Other seborrheic keratosis: Secondary | ICD-10-CM | POA: Diagnosis not present

## 2016-07-12 DIAGNOSIS — L280 Lichen simplex chronicus: Secondary | ICD-10-CM | POA: Diagnosis not present

## 2016-07-12 DIAGNOSIS — R21 Rash and other nonspecific skin eruption: Secondary | ICD-10-CM | POA: Diagnosis not present

## 2016-09-13 ENCOUNTER — Telehealth: Payer: Self-pay | Admitting: Family Medicine

## 2016-09-13 NOTE — Telephone Encounter (Signed)
Pt needs refill on potassium chloride (K-DUR) 10 MEQ tablet  She would like 30 days supply sent to [facility].  She has recently moved and cannot find her medication bottle.  Best number [phone]

## 2016-09-14 ENCOUNTER — Ambulatory Visit (INDEPENDENT_AMBULATORY_CARE_PROVIDER_SITE_OTHER): Payer: PPO

## 2016-09-14 DIAGNOSIS — Z23 Encounter for immunization: Secondary | ICD-10-CM

## 2016-09-14 MED ORDER — POTASSIUM CHLORIDE ER 10 MEQ PO TBCR: 10.0000 meq | EXTENDED_RELEASE_TABLET | Freq: Every day | ORAL | 0 refills | Status: DC

## 2016-09-14 NOTE — Telephone Encounter (Signed)
Sent.  Thanks.   Please make sure we have her current address in EMR.

## 2016-09-23 ENCOUNTER — Encounter: Payer: Self-pay | Admitting: Family Medicine

## 2016-09-23 ENCOUNTER — Ambulatory Visit (INDEPENDENT_AMBULATORY_CARE_PROVIDER_SITE_OTHER): Payer: PPO | Admitting: Family Medicine

## 2016-09-23 VITALS — BP 120/88 | HR 67 | Wt 229.0 lb

## 2016-09-23 DIAGNOSIS — H1011 Acute atopic conjunctivitis, right eye: Secondary | ICD-10-CM

## 2016-09-23 DIAGNOSIS — J309 Allergic rhinitis, unspecified: Secondary | ICD-10-CM

## 2016-09-23 NOTE — Patient Instructions (Signed)
Please get some over the counter antihistamine eye drops Continue the Claritin every day If not better, please see your ophthalmologist (we are happy to help you with a referral)   Allergic Conjunctivitis Allergic conjunctivitis is inflammation of the clear membrane that covers the white part of your eye and the inner surface of your eyelid (conjunctiva), and it is caused by allergies. The blood vessels in the conjunctiva become inflamed, and this causes the eye to become red or pink, and it often causes itchiness in the eye. Allergic conjunctivitis cannot be spread by one person to another person (noncontagious). CAUSES This condition is caused by an allergic reaction. Common causes of an allergic reaction (allergens) include:  Dust.  Pollen.  Mold.  Animal dander or secretions. RISK FACTORS This condition is more likely to develop if you are exposed to high levels of allergens that cause the allergic reaction. This might include being outdoors when air pollen levels are high or being around animals that you are allergic to. SYMPTOMS Symptoms of this condition may include:  Eye redness.  Tearing of the eyes.  Watery eyes.  Itchy eyes.  Burning feeling in the eyes.  Clear drainage from the eyes.  Swollen eyelids. DIAGNOSIS This condition may be diagnosed by medical history and physical exam. If you have drainage from your eyes, it may be tested to rule out other causes of conjunctivitis. TREATMENT Treatment for this condition often includes medicines. These may be eye drops, ointments, or oral medicines. They may be prescription medicines or over-the-counter medicines. HOME CARE INSTRUCTIONS  Take or apply medicines only as directed by your health care provider.  Do not touch or rub your eyes.  Do not wear contact lenses until the inflammation is gone. Wear glasses instead.  Do not wear eye makeup until the inflammation is gone.  Apply a cool, clean washcloth to your  eye for 10-20 minutes, 3-4 times a day.  Try to avoid whatever allergen is causing the allergic reaction. SEEK MEDICAL CARE IF:  Your symptoms get worse.  You have pus draining from your eye.  You have new symptoms.  You have a fever.   This information is not intended to replace advice given to you by your health care provider. Make sure you discuss any questions you have with your health care provider.   Document Released: 02/19/2003 Document Revised: 12/20/2014 Document Reviewed: 09/10/2014 Elsevier Interactive Patient Education [employer].

## 2016-09-23 NOTE — Progress Notes (Signed)
   Subjective:    Patient ID: Julie Phillips, female    DOB: November 30, 1932, 82 y.o.   MRN: [id number]  HPI Patient presents today with one week of right eye watering, itching, clear drainage. No blurred vision, decreased or double vision. Some light sensitivity. No foreign body sensation. Has been using Claritin and some over the counter eye drops for red eyes. No fever, no cough. Feels warm today. Had flu shot last week.  Has recently moved and has been unpacking. Was in [place] for 6 weeks to stay with her son while her daughter sold their house. Is going to [place] in 4 days for 2 weeks.   Past Medical History:  Diagnosis Date  . AF (atrial fibrillation) ([place])   . Fall   . GERD (gastroesophageal reflux disease)   . History of chicken pox   . Hx of colonic polyps   . Hyperlipidemia   . Hypertension   . Hypothyroidism   . LVH (left ventricular hypertrophy)    Past Surgical History:  Procedure Laterality Date  . CARDIOVASCULAR STRESS TEST  2011   normal    Family History  Problem Relation Age of Onset  . Heart disease Mother   . Heart disease Father   . Breast cancer Sister   . Breast cancer Daughter   . Colon cancer Neg Hx    Social History  Substance Use Topics  . Smoking status: Former Smoker    Packs/day: 2.00    Years: 20.00    Types: Cigarettes    Quit date: 12/14/1971  . Smokeless tobacco: Never Used  . Alcohol use No      Review of Systems Per HPI    Objective:   Physical Exam  Constitutional: She is oriented to person, place, and time. She appears well-developed and well-nourished.  HENT:  Head: Normocephalic and atraumatic.  Eyes: Pupils are equal, round, and reactive to light. Right eye exhibits discharge (clear, watery). Right eye exhibits no exudate. Left eye exhibits no discharge and no exudate. Right conjunctiva is injected. Right conjunctiva has no hemorrhage. Left conjunctiva is not injected. Left conjunctiva has no hemorrhage.  Cardiovascular: Normal  rate.   Pulmonary/Chest: Effort normal.  Neurological: She is alert and oriented to person, place, and time.  Skin: Skin is warm and dry.  Psychiatric: She has a normal mood and affect. Her behavior is normal. Judgment and thought content normal.  Vitals reviewed.     BP 120/88   Pulse 67   Wt 229 lb (103.9 kg)   SpO2 93%   BMI 41.88 kg/m  Wt Readings from Last 3 Encounters:  09/23/16 229 lb (103.9 kg)  06/21/16 229 lb 8 oz (104.1 kg)  06/17/16 226 lb 8 oz (102.7 kg)       Assessment & Plan:  1. Allergic conjunctivitis and rhinitis, right - Provided written and verbal information regarding diagnosis and treatment. - OTC antihistamine eye drops, can also use artificial tears/lubricant eye drops for comfort - RTC precautions reviewed, if not improved will need to see opthalmology.   Clarene Reamer, FNP-BC  [facility]  09/23/2016 2:22 PM

## 2016-12-13 DIAGNOSIS — H189 Unspecified disorder of cornea: Secondary | ICD-10-CM

## 2016-12-13 HISTORY — DX: Unspecified disorder of cornea: H18.9

## 2016-12-21 ENCOUNTER — Other Ambulatory Visit: Payer: Self-pay

## 2016-12-21 MED ORDER — METOPROLOL TARTRATE 50 MG PO TABS: 50.0000 mg | ORAL_TABLET | Freq: Every day | ORAL | 1 refills | Status: DC

## 2016-12-21 MED ORDER — LEVOTHYROXINE SODIUM 125 MCG PO TABS: 125.0000 ug | ORAL_TABLET | Freq: Every day | ORAL | 1 refills | Status: DC

## 2016-12-21 MED ORDER — POTASSIUM CHLORIDE ER 10 MEQ PO TBCR: 10.0000 meq | EXTENDED_RELEASE_TABLET | Freq: Every day | ORAL | 1 refills | Status: DC

## 2016-12-21 MED ORDER — LOSARTAN POTASSIUM 100 MG PO TABS: 100.0000 mg | ORAL_TABLET | Freq: Every day | ORAL | 1 refills | Status: DC

## 2016-12-21 NOTE — Telephone Encounter (Signed)
Pt seen 06/2016 and has CPX scheduled 06/2017 and request refills levothyroxine, losartan, metoprolol and K to [facility]; advised pt done per protocol and pt voiced understanding.

## 2017-01-07 DIAGNOSIS — H18421 Band keratopathy, right eye: Secondary | ICD-10-CM | POA: Diagnosis not present

## 2017-01-11 DIAGNOSIS — H18421 Band keratopathy, right eye: Secondary | ICD-10-CM | POA: Diagnosis not present

## 2017-01-14 ENCOUNTER — Ambulatory Visit (INDEPENDENT_AMBULATORY_CARE_PROVIDER_SITE_OTHER): Payer: PPO | Admitting: Family Medicine

## 2017-01-14 ENCOUNTER — Encounter: Payer: Self-pay | Admitting: Family Medicine

## 2017-01-14 VITALS — BP 134/72 | HR 87 | Temp 97.9°F | Wt 229.5 lb

## 2017-01-14 DIAGNOSIS — H18421 Band keratopathy, right eye: Secondary | ICD-10-CM | POA: Diagnosis not present

## 2017-01-14 LAB — BASIC METABOLIC PANEL
BUN: 11 mg/dL (ref 6–23)
CHLORIDE: 104 meq/L (ref 96–112)
CO2: 29 meq/L (ref 19–32)
CREATININE: 0.61 mg/dL (ref 0.40–1.20)
Calcium: 9.1 mg/dL (ref 8.4–10.5)
GFR: 99.74 mL/min (ref >60.00)
Glucose, Bld: 84 mg/dL (ref 70–99)
Potassium: 3.9 mEq/L (ref 3.5–5.1)
Sodium: 138 mEq/L (ref 135–145)

## 2017-01-14 MED ORDER — DIPHENHYDRAMINE HCL 25 MG PO CAPS: 25.0000 mg | ORAL_CAPSULE | Freq: Every day | ORAL | Status: DC | PRN

## 2017-01-14 MED ORDER — LORATADINE 10 MG PO TABS: 10.0000 mg | ORAL_TABLET | Freq: Every day | ORAL | Status: DC | PRN

## 2017-01-14 NOTE — Progress Notes (Signed)
For a few months she was having R eye pain.  Kept watering.    Has seen eye clinic in the meantime, after trying benadryl.  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    No sx in the L eye.    D/w pt about 3 questions for her situation: the cause, the risk to other eye, possible prevention of another episode.  See plan.    Prev Ca normal, recheck today- see notes on labs.    She wanted to discuss all of this today.  Meds, vitals, and allergies reviewed.   ROS: Per HPI unless specifically indicated in ROS section   nad ncat Right eye with corneal defect noted, visible to the naked eye on exam

## 2017-01-14 NOTE — Patient Instructions (Signed)
Go to the lab on the way out.  We'll contact you with your lab report. I'll check with the eye clinic.  Take care.  Glad to see you.

## 2017-01-14 NOTE — Progress Notes (Signed)
Pre visit review using our clinic review tool, if applicable. No additional management support is needed unless otherwise documented below in the visit note. 

## 2017-01-16 DIAGNOSIS — H18429 Band keratopathy, unspecified eye: Secondary | ICD-10-CM | POA: Insufficient documentation

## 2017-01-16 NOTE — Assessment & Plan Note (Signed)
>  15 minutes spent in face to face time with patient, >50% spent in counselling or coordination of care BMET is  fine. I talked to Dr. Edison Pace at the eye clinic. There is no sign of systemic illness that caused her eye problem. She could've had an old injury/irritation to the eye years ago that could've led to the current issues. We'll never know that definitively. There is no reason to suspect that she is at increased risk for trouble with the other eye. There is nothing else needs to be done for prevention at this point.  I have all confidence in the eye clinic and I expect them to give her good care.

## 2017-01-17 ENCOUNTER — Telehealth: Payer: Self-pay | Admitting: *Deleted

## 2017-01-17 NOTE — Telephone Encounter (Signed)
Labs faxed to [facility] as instructed by Dr. Damita Dunnings at [phone].

## 2017-01-17 NOTE — Telephone Encounter (Signed)
-----   Message from Tonia Ghent, MD sent at 01/16/2017  9:32 PM EST ----- Please send a copy of the labs to [facility]. Thanks.

## 2017-02-21 DIAGNOSIS — H18421 Band keratopathy, right eye: Secondary | ICD-10-CM | POA: Diagnosis not present

## 2017-02-22 DIAGNOSIS — H18421 Band keratopathy, right eye: Secondary | ICD-10-CM | POA: Diagnosis not present

## 2017-04-26 ENCOUNTER — Telehealth: Payer: Self-pay | Admitting: *Deleted

## 2017-04-26 NOTE — Telephone Encounter (Signed)
Fax received with patient's signed authorization for telephone caption service to better communicate during telephone conversations.  Form placed in Dr. Buckner [place].

## 2017-04-27 NOTE — Telephone Encounter (Signed)
I'll work on the hard copy.  Thanks.  

## 2017-05-02 NOTE — Telephone Encounter (Signed)
Form faxed and sent for scanning.

## 2017-05-23 DIAGNOSIS — H18421 Band keratopathy, right eye: Secondary | ICD-10-CM | POA: Diagnosis not present

## 2017-06-14 NOTE — Progress Notes (Signed)
Subjective:   Julie Phillips is a 83 y.o. female who presents for Medicare Annual (Subsequent) preventive examination.  Review of Systems:  No ROS.  Medicare Wellness Visit. Additional risk factors are reflected in the social history.  Cardiac Risk Factors include: advanced age (>79men, >75 women);diabetes mellitus;dyslipidemia;obesity (BMI >30kg/m2);sedentary lifestyle;hypertension     Objective:     Vitals: BP 132/66   Pulse (!) 59   Ht 5' 0.75" (1.543 m)   Wt 233 lb 8 oz (105.9 kg)   SpO2 98%   BMI 44.48 kg/m   Body mass index is 44.48 kg/m.   Tobacco History  Smoking Status  . Former Smoker  . Packs/day: 2.00  . Years: 20.00  . Types: Cigarettes  . Quit date: 12/14/1971  Smokeless Tobacco  . Never Used     Counseling given: Not Answered   Past Medical History:  Diagnosis Date  . AF (atrial fibrillation) ([place])   . Fall   . GERD (gastroesophageal reflux disease)   . History of chicken pox   . Hx of colonic polyps   . Hyperlipidemia   . Hypertension   . Hypothyroidism   . Keratopathy 2018   R eye  . LVH (left ventricular hypertrophy)    Past Surgical History:  Procedure Laterality Date  . CARDIOVASCULAR STRESS TEST  2011   normal    Family History  Problem Relation Age of Onset  . Heart disease Mother   . Heart disease Father   . Breast cancer Sister   . Breast cancer Daughter   . Colon cancer Neg Hx    History  Sexual Activity  . Sexual activity: No    Outpatient Encounter Prescriptions as of 06/22/2017  Medication Sig  . aspirin 81 MG chewable tablet Chew 2 tablets by mouth at bedtime as needed.   . Cyanocobalamin (VITAMIN B-12 PO) Take 2,500 mg by mouth daily.  . diphenhydrAMINE (BENADRYL) 25 mg capsule Take 1 capsule (25 mg total) by mouth daily as needed.  . famotidine (PEPCID) 20 MG tablet Take 20 mg by mouth 2 (two) times daily.  Marland Kitchen levothyroxine (SYNTHROID, LEVOTHROID) 125 MCG tablet Take 1 tablet (125 mcg total) by mouth daily.  Except for 0.5 tab on Sunday  . loratadine (CLARITIN) 10 MG tablet Take 1 tablet (10 mg total) by mouth daily as needed.  Marland Kitchen losartan (COZAAR) 100 MG tablet Take 1 tablet (100 mg total) by mouth daily.  . Lutein-Zeaxanthin 15-0.7 MG CAPS Taking 20mg  in AM and 35mg  in PM of lutein (Patient taking differently: Taking 20mg  in AM and 40mg  in PM of lutein)  . metoprolol (LOPRESSOR) 50 MG tablet Take 1 tablet (50 mg total) by mouth daily.  . potassium chloride (K-DUR) 10 MEQ tablet Take 1 tablet (10 mEq total) by mouth daily.  . Calcium Carbonate-Vitamin D (CALTRATE 600+D PO) Take by mouth as needed.   . Multiple Vitamins-Minerals (VISION FORMULA) TABS Take by mouth daily.   No facility-administered encounter medications on file as of 06/22/2017.     Activities of Daily Living In your present state of health, do you have any difficulty performing the following activities: 06/22/2017  Hearing? Y  Vision? N  Difficulty concentrating or making decisions? N  Walking or climbing stairs? Y  Dressing or bathing? N  Doing errands, shopping? N  Preparing Food and eating ? N  Using the Toilet? N  In the past six months, have you accidently leaked urine? N  Do you have problems with  loss of bowel control? N  Managing your Medications? N  Managing your Finances? N  Housekeeping or managing your Housekeeping? N  Some recent data might be hidden    Patient Care Team: Tonia Ghent, MD as PCP - General (Family Medicine) Minna Merritts, MD as Consulting Physician (Cardiology) Doyce Para, MD as Consulting Physician (Ophthalmology) Estill Cotta, MD as Consulting Physician (Ophthalmology)    Assessment:    Physical assessment deferred to PCP.  Exercise Activities and Dietary recommendations Current Exercise Habits: The patient does not participate in regular exercise at present, Exercise limited by: orthopedic condition(s) (Shoulder injury)  Goals    . Increase physical activity  (pt-stated)          Starting 06/22/2017, I will increase my walking with the goal of being able to walk to the [facility] and back and walk 1 lap around the track instead of driving.      Fall Risk Fall Risk  06/22/2017 06/17/2016 01/14/2015 01/03/2014 12/26/2012  Falls in the past year? Yes Yes Yes No Yes  Number falls in past yr: 2 or more 2 or more - - 1  Injury with Fall? No - - - Yes  Risk Factor Category  - High Fall Risk - - -  Risk for fall due to : History of fall(s) Impaired mobility;Impaired balance/gait - - -  Follow up Falls prevention discussed Falls evaluation completed;Education provided - - -   Depression Screen PHQ 2/9 Scores 06/22/2017 06/17/2016 01/14/2015 01/03/2014  PHQ - 2 Score 1 0 0 0     Cognitive Function PLEASE NOTE: A Mini-Cog screen was completed. Maximum score is 20. A value of 0 denotes this part of [place] MMSE was not completed or the patient failed this part of the Mini-Cog screening.   Mini-Cog Screening Orientation to Time - Max 5 pts Orientation to Place - Max 5 pts Registration - Max 3 pts Recall - Max 3 pts Language Repeat - Max 1 pts Language Follow 3 Step Command - Max 3 pts      Mini-Cog - 06/22/17 0919    Normal clock drawing test? yes   How many words correct? 3      MMSE - Mini Mental State Exam 06/22/2017 06/17/2016  Orientation to time 5 5  Orientation to Place 5 5  Registration 3 3  Attention/ Calculation 0 0  Recall 3 3  Language- name 2 objects 0 0  Language- repeat 1 1  Language- follow 3 step command 3 3  Language- read & follow direction 0 0  Write a sentence 0 0  Copy design 0 0  Total score 20 20        Immunization History  Administered Date(s) Administered  . Influenza Split 10/04/2012  . Influenza,inj,Quad PF,36+ Mos 10/08/2013, 09/25/2015, 09/14/2016  . Influenza-Unspecified 09/13/2011, 08/13/2014  . Pneumococcal Conjugate-13 08/13/2014  . Pneumococcal Polysaccharide-23 12/13/2010  . Td 12/13/2010    Screening Tests Health Maintenance  Topic Date Due  . INFLUENZA VACCINE  07/13/2017  . TETANUS/TDAP  12/13/2020  . DEXA SCAN  Completed  . PNA vac Low Risk Adult  Completed      Plan:    Follow-up w/ PCP as scheduled.   I have personally reviewed and noted the following in the patient's chart:   . Medical and social history . Use of alcohol, tobacco or illicit drugs  . Current medications and supplements . Functional ability and status . Nutritional status . Physical activity . Advanced  directives . List of other physicians . Vitals . Screenings to include cognitive, depression, and falls . Referrals and appointments  In addition, I have reviewed and discussed with patient certain preventive protocols, quality metrics, and best practice recommendations. A written personalized care plan for preventive services as well as general preventive health recommendations were provided to patient.     Dorrene German, RN  06/22/2017

## 2017-06-16 NOTE — Progress Notes (Signed)
PCP notes:   Health maintenance: No gaps identified.   Abnormal screenings:  Hearing-failed. She is not interested in pursuing hearing aids.  Patient concerns: She would like to discuss filling out DNR form.   Nurse concerns: None.   Next PCP appt: 06/27/2017 @ 9:45am  I reviewed health advisor's note, was available for consultation on the day of service listed in this note, and agree with documentation and plan. Elsie Stain, MD.

## 2017-06-20 ENCOUNTER — Other Ambulatory Visit: Payer: Self-pay | Admitting: Family Medicine

## 2017-06-20 DIAGNOSIS — I10 Essential (primary) hypertension: Secondary | ICD-10-CM

## 2017-06-22 ENCOUNTER — Other Ambulatory Visit: Payer: PPO

## 2017-06-22 ENCOUNTER — Ambulatory Visit (INDEPENDENT_AMBULATORY_CARE_PROVIDER_SITE_OTHER): Payer: PPO

## 2017-06-22 ENCOUNTER — Other Ambulatory Visit (INDEPENDENT_AMBULATORY_CARE_PROVIDER_SITE_OTHER): Payer: PPO

## 2017-06-22 VITALS — BP 132/66 | HR 59 | Ht 60.75 in | Wt 233.5 lb

## 2017-06-22 DIAGNOSIS — Z0000 Encounter for general adult medical examination without abnormal findings: Secondary | ICD-10-CM

## 2017-06-22 DIAGNOSIS — I10 Essential (primary) hypertension: Secondary | ICD-10-CM | POA: Diagnosis not present

## 2017-06-22 LAB — LIPID PANEL
Cholesterol: 203 mg/dL — ABNORMAL HIGH (ref 0–200)
HDL: 46.8 mg/dL (ref >39.00)
LDL Cholesterol: 130 mg/dL — ABNORMAL HIGH (ref 0–99)
NONHDL: 156.59
Total CHOL/HDL Ratio: 4
Triglycerides: 133 mg/dL (ref 0.0–149.0)
VLDL: 26.6 mg/dL (ref 0.0–40.0)

## 2017-06-22 LAB — COMPREHENSIVE METABOLIC PANEL
ALK PHOS: 86 U/L (ref 39–117)
ALT: 7 U/L (ref 0–35)
AST: 11 U/L (ref 0–37)
Albumin: 3.7 g/dL (ref 3.5–5.2)
BILIRUBIN TOTAL: 0.4 mg/dL (ref 0.2–1.2)
BUN: 11 mg/dL (ref 6–23)
CHLORIDE: 105 meq/L (ref 96–112)
CO2: 29 mEq/L (ref 19–32)
CREATININE: 0.72 mg/dL (ref 0.40–1.20)
Calcium: 9.3 mg/dL (ref 8.4–10.5)
GFR: 82.28 mL/min (ref >60.00)
Glucose, Bld: 97 mg/dL (ref 70–99)
Potassium: 4.1 mEq/L (ref 3.5–5.1)
SODIUM: 139 meq/L (ref 135–145)
TOTAL PROTEIN: 7.1 g/dL (ref 6.0–8.3)

## 2017-06-22 LAB — TSH: TSH: 1.46 u[IU]/mL (ref 0.35–4.50)

## 2017-06-22 NOTE — Progress Notes (Signed)
Pre visit review using our clinic review tool, if applicable. No additional management support is needed unless otherwise documented below in the visit note. 

## 2017-06-22 NOTE — Patient Instructions (Addendum)
Julie Phillips , Thank you for taking time to come for your Medicare Wellness Visit. I appreciate your ongoing commitment to your health goals. Please review the following plan we discussed and let me know if I can assist you in the future.   These are the goals we discussed: Goals    . Increase physical activity (pt-stated)          Starting 06/22/2017, I will increase my walking with the goal of being able to walk to the [facility] and back and walk 1 lap around the track instead of driving.       This is a list of the screening recommended for you and due dates:  Health Maintenance  Topic Date Due  . Flu Shot  07/13/2017  . Tetanus Vaccine  12/13/2020  . DEXA scan (bone density measurement)  Completed  . Pneumonia vaccines  Completed    Preventive Care for Adults  A healthy lifestyle and preventive care can promote health and wellness. Preventive health guidelines for adults include the following key practices.  . A routine yearly physical is a good way to check with your health care provider about your health and preventive screening. It is a chance to share any concerns and updates on your health and to receive a thorough exam.  . Visit your dentist for a routine exam and preventive care every 6 months. Brush your teeth twice a day and floss once a day. Good oral hygiene prevents tooth decay and gum disease.  . The frequency of eye exams is based on your age, health, family medical history, use  of contact lenses, and other factors. Follow your health care provider's ecommendations for frequency of eye exams.  . Eat a healthy diet. Foods like vegetables, fruits, whole grains, low-fat dairy products, and lean protein foods contain the nutrients you need without too many calories. Decrease your intake of foods high in solid fats, added sugars, and salt. Eat the right amount of calories for you. Get information about a proper diet from your health care provider, if necessary.  .  Regular physical exercise is one of the most important things you can do for your health. Most adults should get at least 150 minutes of moderate-intensity exercise (any activity that increases your heart rate and causes you to sweat) each week. In addition, most adults need muscle-strengthening exercises on 2 or more days a week.  Silver Sneakers may be a benefit available to you. To determine eligibility, you may visit the website: www.silversneakers.com or contact program at (208)694-5708 Mon-Fri between 8AM-8PM.   . Maintain a healthy weight. The body mass index (BMI) is a screening tool to identify possible weight problems. It provides an estimate of body fat based on height and weight. Your health care provider can find your BMI and can help you achieve or maintain a healthy weight.   For adults 20 years and older: ? A BMI below 18.5 is considered underweight. ? A BMI of 18.5 to 24.9 is normal. ? A BMI of 25 to 29.9 is considered overweight. ? A BMI of 30 and above is considered obese.   . Maintain normal blood lipids and cholesterol levels by exercising and minimizing your intake of saturated fat. Eat a balanced diet with plenty of fruit and vegetables. Blood tests for lipids and cholesterol should begin at age 53 and be repeated every 5 years. If your lipid or cholesterol levels are high, you are over 50, or you are at high  risk for heart disease, you may need your cholesterol levels checked more frequently. Ongoing high lipid and cholesterol levels should be treated with medicines if diet and exercise are not working.  . If you smoke, find out from your health care provider how to quit. If you do not use tobacco, please do not start.  . If you choose to drink alcohol, please do not consume more than 2 drinks per day. One drink is considered to be 12 ounces (355 mL) of beer, 5 ounces (148 mL) of wine, or 1.5 ounces (44 mL) of liquor.  . If you are 7-63 years old, ask your health care  provider if you should take aspirin to prevent strokes.  . Use sunscreen. Apply sunscreen liberally and repeatedly throughout the day. You should seek shade when your shadow is shorter than you. Protect yourself by wearing long sleeves, pants, a wide-brimmed hat, and sunglasses year round, whenever you are outdoors.  . Once a month, do a whole body skin exam, using a mirror to look at the skin on your back. Tell your health care provider of new moles, moles that have irregular borders, moles that are larger than a pencil eraser, or moles that have changed in shape or color.

## 2017-06-27 ENCOUNTER — Encounter: Payer: Self-pay | Admitting: Family Medicine

## 2017-06-27 ENCOUNTER — Ambulatory Visit (INDEPENDENT_AMBULATORY_CARE_PROVIDER_SITE_OTHER): Payer: PPO | Admitting: Family Medicine

## 2017-06-27 VITALS — BP 126/76 | HR 76 | Temp 97.9°F | Ht 61.0 in | Wt 234.0 lb

## 2017-06-27 DIAGNOSIS — E038 Other specified hypothyroidism: Secondary | ICD-10-CM | POA: Diagnosis not present

## 2017-06-27 DIAGNOSIS — S91209A Unspecified open wound of unspecified toe(s) with damage to nail, initial encounter: Secondary | ICD-10-CM

## 2017-06-27 DIAGNOSIS — I10 Essential (primary) hypertension: Secondary | ICD-10-CM | POA: Diagnosis not present

## 2017-06-27 DIAGNOSIS — H18421 Band keratopathy, right eye: Secondary | ICD-10-CM

## 2017-06-27 DIAGNOSIS — I480 Paroxysmal atrial fibrillation: Secondary | ICD-10-CM | POA: Diagnosis not present

## 2017-06-27 DIAGNOSIS — Z7189 Other specified counseling: Secondary | ICD-10-CM

## 2017-06-27 DIAGNOSIS — Z66 Do not resuscitate: Secondary | ICD-10-CM | POA: Insufficient documentation

## 2017-06-27 DIAGNOSIS — Z0000 Encounter for general adult medical examination without abnormal findings: Secondary | ICD-10-CM

## 2017-06-27 MED ORDER — LOSARTAN POTASSIUM 100 MG PO TABS: 100.0000 mg | ORAL_TABLET | Freq: Every day | ORAL | 3 refills | Status: DC

## 2017-06-27 MED ORDER — POTASSIUM CHLORIDE ER 10 MEQ PO TBCR: 10.0000 meq | EXTENDED_RELEASE_TABLET | Freq: Every day | ORAL | 3 refills | Status: DC

## 2017-06-27 MED ORDER — LEVOTHYROXINE SODIUM 125 MCG PO TABS: 125.0000 ug | ORAL_TABLET | Freq: Every day | ORAL | 3 refills | Status: DC

## 2017-06-27 MED ORDER — METOPROLOL TARTRATE 50 MG PO TABS: 50.0000 mg | ORAL_TABLET | Freq: Every day | ORAL | 3 refills | Status: DC

## 2017-06-27 NOTE — Assessment & Plan Note (Signed)
Advance directive- daughter Laurita Quint if patient were incapacitated.  If patient were found with no pulse or spontaneous breath, she wouldn't want CPR or intubation.  She wants a DNR form done. Done at [place].

## 2017-06-27 NOTE — Progress Notes (Signed)
Hypothyroidism. No ADE on med.  occ missed dose, about once a week, d/w pt.  No tmg.  No dysphagia.    She has band keratopathy and has f/u with [facility] pending.  I'll defer.    Hypertension:    Using medication without problems or lightheadedness: yes Chest pain with exertion:no Edema:no Short of breath:no  No recent AF sx, no in about 1 year.  D/w pt.    Declined hearing aids.   She hadn't fallen in a year until yesterday.  D/w pt.  "I got clumsy, moving too quickly."  Usually uses her walker.  She was moving and rotating and slid.  No LOC.  R foot pain at the R 2nd nail prev.  Able to get up with help.  No pain now.  Able to bear weight.   Advance directive- daughter Laurita Quint if patient were incapacitated.  If patient were found with no pulse or spontaneous breath, she wouldn't want CPR or intubation.  She wants a DNR form done. D/w pt.  Mammogram declined.  D/w pt.  DEXA declined.    Meds, vitals, and allergies reviewed.   PMH and SH reviewed  ROS: Per HPI unless specifically indicated in ROS section   GEN: nad, alert and oriented HEENT: mucous membranes moist NECK: supple w/o LA CV: rrr. PULM: ctab, no inc wob ABD: soft, +bs EXT: no edema SKIN: no acute rash R 2nd toenail partially avulsed but doesn't appear infected.

## 2017-06-27 NOTE — Patient Instructions (Signed)
Julie Phillips will call about your referral. Don't change your meds for now.  Keep using your walker and be careful.   Update me as needed.  Take care.  Glad to see you.

## 2017-06-28 ENCOUNTER — Ambulatory Visit (INDEPENDENT_AMBULATORY_CARE_PROVIDER_SITE_OTHER): Payer: PPO | Admitting: Podiatry

## 2017-06-28 ENCOUNTER — Encounter: Payer: PPO | Admitting: Family Medicine

## 2017-06-28 ENCOUNTER — Encounter: Payer: Self-pay | Admitting: Podiatry

## 2017-06-28 DIAGNOSIS — S91209A Unspecified open wound of unspecified toe(s) with damage to nail, initial encounter: Secondary | ICD-10-CM | POA: Insufficient documentation

## 2017-06-28 DIAGNOSIS — L03031 Cellulitis of right toe: Secondary | ICD-10-CM | POA: Diagnosis not present

## 2017-06-28 DIAGNOSIS — Z0000 Encounter for general adult medical examination without abnormal findings: Secondary | ICD-10-CM | POA: Insufficient documentation

## 2017-06-28 NOTE — Assessment & Plan Note (Addendum)
Reasonable control, labs discussed with patient. No change in meds. She agrees. >25 minutes spent in face to face time with patient, >50% spent in counselling or coordination of care regarding hypothyroidism, hypertension, CODE STATUS, atrial fibrillation history, etc.

## 2017-06-28 NOTE — Assessment & Plan Note (Signed)
Advance directive- daughter Laurita Quint if patient were incapacitated.  If patient were found with no pulse or spontaneous breath, she wouldn't want CPR or intubation.  She wants a DNR form done. D/w pt.  Mammogram declined.  D/w pt.  DEXA declined.

## 2017-06-28 NOTE — Assessment & Plan Note (Signed)
Not anticoagulated, no recent palpitations. Continue as is.

## 2017-06-28 NOTE — Assessment & Plan Note (Signed)
Advance directive- daughter Laurita Quint if patient were incapacitated.  If patient were found with no pulse or spontaneous breath, she wouldn't want CPR or intubation.  She wants a DNR form, done at [place] 06/27/17.

## 2017-06-28 NOTE — Progress Notes (Signed)
   Subjective:    Patient ID: Julie Phillips, female    DOB: Apr 13, 1932, 83 y.o.   MRN: [id number]  HPI: She presents today chief complaint of a painful nearly avulsed second toenail right was sent over here by urgent care. States that 3 days ago she hit the tip of the toe on a ramp was going into her house. She states that it not toenail loose and there was a lot of bleeding. She denies any other trauma.    Review of Systems  All other systems reviewed and are negative.      Objective:   Physical Exam: Vital signs are stable alert and oriented 3. Pulses are palpable. Neurologic sensorium is intact. Deep tendon reflexes are intact. Muscle strength is normal bilateral. Toenail right is nearly completely avulsed with some bleeding there is mild erythema surrounding it there is no tenderness on palpation of the bones in that second toe itself        Assessment & Plan:  Paronychia nearly avulsed second digit right foot.  Plan: Total nail avulsion was performed today after local anesthesia was administered she tolerated procedure well without complications. She will start soaking twice daily no follow-up with her

## 2017-06-28 NOTE — Assessment & Plan Note (Signed)
TSH slightly higher, likely due to occasionally missing a dose of medication, but TSH still normal. Continue as is with current dose. She agrees.

## 2017-06-28 NOTE — Assessment & Plan Note (Addendum)
Refer to podiatry. Does not appear infected. Not bleeding. Fall cautions discussed with patient. She had done well until this recent event.

## 2017-06-28 NOTE — Assessment & Plan Note (Signed)
Per off [facility]. I will defer.

## 2017-06-28 NOTE — Patient Instructions (Signed)

## 2017-07-12 ENCOUNTER — Encounter: Payer: Self-pay | Admitting: Podiatry

## 2017-07-12 ENCOUNTER — Ambulatory Visit (INDEPENDENT_AMBULATORY_CARE_PROVIDER_SITE_OTHER): Payer: PPO | Admitting: Podiatry

## 2017-07-12 DIAGNOSIS — L03031 Cellulitis of right toe: Secondary | ICD-10-CM

## 2017-07-12 NOTE — Patient Instructions (Signed)

## 2017-07-13 NOTE — Progress Notes (Signed)
She presents today for follow-up of nail avulsion second digit right foot. She states that she is doing great with that still soaks and every day Epsom salts and warm water. She is also complaining of painful elongated toenails bilaterally.  Objective: Vital signs are stable she is alert and oriented 3 second toe where the nail avulsion was performed appears to be healing very nicely see no signs of infection. It appears to be completely healed at this point. She does have long thick yellow dystrophic mycotic nails bilaterally. Greater than 6 and normal number.  Assessment: Well-healing surgical toe second right. Pain in limb secondary to onychomycosis.  Plan: Debrided toenails 1 through 5 bilateral.

## 2017-08-02 DIAGNOSIS — H18421 Band keratopathy, right eye: Secondary | ICD-10-CM | POA: Diagnosis not present

## 2017-08-05 DIAGNOSIS — Z961 Presence of intraocular lens: Secondary | ICD-10-CM | POA: Diagnosis not present

## 2017-08-05 DIAGNOSIS — H18421 Band keratopathy, right eye: Secondary | ICD-10-CM | POA: Diagnosis not present

## 2017-08-10 NOTE — Progress Notes (Signed)
Patient ID: Julie Phillips, female   DOB: [birth year], 83 y.o.   MRN: [id number] Cardiology Office Note  Date:  08/11/2017   ID:  Julie, Phillips [birth year], MRN [id number]  PCP:  Tonia Ghent, MD   Chief Complaint  Patient presents with  . other    12 month follow up. Patient c/o left arm pain and chest pain when eating. Meds reviewed verbally with patient.     HPI:   Julie Phillips is a pleasant 83 year old woman with remote history of  atrial fibrillation in 2007,  low potassium by her report,  previously on warfarin,  hypertension  who presents for routine followup of her atrial fibrillation.  Denies any significant tachycardia or palpitations Active, scheduled to go to [place] to see family She walks with a walker, has leg weakness. Reports having severe GERD symptoms, food sticking, after she eats has left chest pain radiating down her arm. Difficulty getting the food down, lots of burning Long history of symptoms dating back to last year  Total cholesterol typically around 200. She does not want a statin  EKG on today's visit shows normal sinus rhythm with rate 70 bpm, nonspecific ST abnormality  Other past medical history She has tried statins in the past and had a problem with her "urine". Details are uncertain  Previous echocardiogram that showed mild MR and TR, normal LV systolic function  Stress test was normal showing no ischemia with normal ejection fraction . Carotid ultrasound showed minimal chronic plaquing  PMH:   has a past medical history of AF (atrial fibrillation) ([place]); Fall; GERD (gastroesophageal reflux disease); History of chicken pox; colonic polyps; Hyperlipidemia; Hypertension; Hypothyroidism; Keratopathy (2018); and LVH (left ventricular hypertrophy).  PSH:    Past Surgical History:  Procedure Laterality Date  . CARDIOVASCULAR STRESS TEST  2011   normal   . keratopathy      Current Outpatient Prescriptions  Medication Sig Dispense  Refill  . Cyanocobalamin (VITAMIN B-12 PO) Take 2,500 mg by mouth daily.    . diphenhydrAMINE (BENADRYL) 25 mg capsule Take 1 capsule (25 mg total) by mouth daily as needed.    . famotidine (PEPCID) 20 MG tablet Take 20 mg by mouth 2 (two) times daily.    Marland Kitchen levothyroxine (SYNTHROID, LEVOTHROID) 125 MCG tablet Take 1 tablet (125 mcg total) by mouth daily. Except for 0.5 tab on Sunday 90 tablet 3  . loratadine (CLARITIN) 10 MG tablet Take 1 tablet (10 mg total) by mouth daily as needed.    Marland Kitchen losartan (COZAAR) 100 MG tablet Take 1 tablet (100 mg total) by mouth daily. 90 tablet 3  . Lutein-Zeaxanthin 15-0.7 MG CAPS Taking 20mg  in AM and 35mg  in PM of lutein (Patient taking differently: Taking 20mg  in AM and 40mg  in PM of lutein)    . Multiple Vitamins-Minerals (VISION FORMULA) TABS Take by mouth daily.    [name] Glycol-Propyl Glycol (SYSTANE) 0.4-0.3 % GEL ophthalmic gel Place 1 application into both eyes.    . potassium chloride (K-DUR) 10 MEQ tablet Take 1 tablet (10 mEq total) by mouth daily. 90 tablet 3  . triamcinolone ointment (KENALOG) 0.1 %     . metoprolol succinate (TOPROL-XL) 50 MG 24 hr tablet Take 1 tablet (50 mg total) by mouth daily. Take with or immediately following a meal. 90 tablet 3   No current facility-administered medications for this visit.      Allergies:   Statins; Wheat; Amlodipine; and Lisinopril   Social History:  The patient  reports that she quit smoking about 45 years ago. Her smoking use included Cigarettes. She has a 40.00 pack-year smoking history. She has never used smokeless tobacco. She reports that she does not drink alcohol or use drugs.   Family History:   family history includes Breast cancer in her daughter and sister; Heart disease in her father and mother.    Review of Systems: Review of Systems  Constitutional: Negative.   Respiratory: Negative.   Cardiovascular: Negative.   Gastrointestinal: Positive for heartburn.  Musculoskeletal:  Negative.        Gait instability  Neurological: Negative.   Psychiatric/Behavioral: Negative.   All other systems reviewed and are negative.    PHYSICAL EXAM: VS:  BP (!) 142/72 (BP Location: Left Arm, Patient Position: Sitting, Cuff Size: Large)   Pulse 70   Ht 5\' 1"  (1.549 m)   Wt 233 lb 8 oz (105.9 kg)   BMI 44.12 kg/m  , BMI Body mass index is 44.12 kg/m.  GEN: Well nourished, well developed, in no acute distress  HEENT: normal  Neck: no JVD, carotid bruits, or masses Cardiac: RRR; no murmurs, rubs, or gallops,no edema  Respiratory:  clear to auscultation bilaterally, normal work of breathing GI: soft, nontender, nondistended, + BS MS: no deformity or atrophy  Skin: warm and dry, no rash Neuro:  Strength and sensation are intact Psych: euthymic mood, full affect    Recent Labs: 06/22/2017: ALT 7; BUN 11; Creatinine, Ser 0.72; Potassium 4.1; Sodium 139; TSH 1.46    Lipid Panel Lab Results  Component Value Date   CHOL 203 (H) 06/22/2017   HDL 46.80 06/22/2017   LDLCALC 130 (H) 06/22/2017   TRIG 133.0 06/22/2017      Wt Readings from Last 3 Encounters:  08/11/17 233 lb 8 oz (105.9 kg)  06/27/17 234 lb (106.1 kg)  06/22/17 233 lb 8 oz (105.9 kg)       ASSESSMENT AND PLAN:   Paroxysmal atrial fibrillation (HCC) - Plan: EKG 12-Lead Denies any symptoms concerning for atrial fibrillation, only rare palpitation Recommended she change her metoprolol tartrate 2 metoprolol succinate 50 mg daily  HLD (hyperlipidemia) Does not want a statin, most recent cholesterol around 200  Essential hypertension Blood pressure is well controlled on today's visit. No changes made to the medications.  Obesity We have encouraged continued exercise, careful diet management in an effort to lose weight.    Total encounter time more than 25 minutes  Greater than 50% was spent in counseling and coordination of care with the patient   Disposition:   F/U  12 months   Orders  Placed This Encounter  Procedures  . EKG 12-Lead     Signed, Esmond Plants, M.D., Ph.D. 08/11/2017  [place], [place]

## 2017-08-11 ENCOUNTER — Ambulatory Visit (INDEPENDENT_AMBULATORY_CARE_PROVIDER_SITE_OTHER): Payer: PPO | Admitting: Cardiovascular Disease

## 2017-08-11 ENCOUNTER — Encounter: Payer: Self-pay | Admitting: Cardiovascular Disease

## 2017-08-11 VITALS — BP 142/72 | HR 70 | Ht 61.0 in | Wt 233.5 lb

## 2017-08-11 DIAGNOSIS — R0602 Shortness of breath: Secondary | ICD-10-CM

## 2017-08-11 DIAGNOSIS — E782 Mixed hyperlipidemia: Secondary | ICD-10-CM

## 2017-08-11 DIAGNOSIS — I10 Essential (primary) hypertension: Secondary | ICD-10-CM

## 2017-08-11 DIAGNOSIS — I480 Paroxysmal atrial fibrillation: Secondary | ICD-10-CM | POA: Diagnosis not present

## 2017-08-11 MED ORDER — METOPROLOL SUCCINATE ER 50 MG PO TB24: 50.0000 mg | ORAL_TABLET | Freq: Every day | ORAL | 3 refills | Status: DC

## 2017-08-11 NOTE — Patient Instructions (Addendum)
Medication Instructions:   Please consider omeprazole 1 to 2 a day for GERD  Please change the metoprolol tartrate to metoprolol succinate one a day  Labwork:  No new labs needed  Testing/Procedures:  No further testing at this time   Follow-Up: It was a pleasure seeing you in the office today. Please call us if you have new issues that need to be addressed before your next appt.  [phone]  Your physician wants you to follow-up in: 12 months.  You will receive a reminder letter in the mail two months in advance. If you don't receive a letter, please call our office to schedule the follow-up appointment.  If you need a refill on your cardiac medications before your next appointment, please call your pharmacy.

## 2017-08-12 ENCOUNTER — Ambulatory Visit: Payer: PPO | Admitting: Cardiovascular Disease

## 2017-09-12 DIAGNOSIS — H18421 Band keratopathy, right eye: Secondary | ICD-10-CM | POA: Diagnosis not present

## 2017-10-06 ENCOUNTER — Ambulatory Visit (INDEPENDENT_AMBULATORY_CARE_PROVIDER_SITE_OTHER): Payer: PPO

## 2017-10-06 DIAGNOSIS — Z23 Encounter for immunization: Secondary | ICD-10-CM

## 2017-10-11 ENCOUNTER — Telehealth: Payer: Self-pay | Admitting: Family Medicine

## 2017-10-11 NOTE — Telephone Encounter (Signed)
See below note about robot calls  Copied from [place] #[id number]. Topic: Inquiry >> Oct 11, 2017  2:06 PM Boyd Kerbs wrote: Reason for CRM: Patient called in and ask to be taken off the robot call for things. She says they are very confusing

## 2017-10-12 ENCOUNTER — Ambulatory Visit: Payer: PPO | Admitting: Podiatry

## 2017-10-19 ENCOUNTER — Ambulatory Visit: Payer: PPO | Admitting: Podiatry

## 2017-10-19 ENCOUNTER — Encounter: Payer: Self-pay | Admitting: Podiatry

## 2017-10-19 DIAGNOSIS — M79676 Pain in unspecified toe(s): Secondary | ICD-10-CM | POA: Diagnosis not present

## 2017-10-19 DIAGNOSIS — B351 Tinea unguium: Secondary | ICD-10-CM

## 2017-10-19 NOTE — Progress Notes (Signed)
She presents today for chief complaint of painful elongated toenails.  Objective: Tenderness along the talar dystrophic with mycotic no open lesions or wounds.  Assessment: Pain limb senior onychomycosis.  Plan: Debridement of toenails 1 through 5 bilateral.

## 2017-11-11 ENCOUNTER — Ambulatory Visit: Payer: PPO | Admitting: Family Medicine

## 2017-11-11 ENCOUNTER — Encounter: Payer: Self-pay | Admitting: Family Medicine

## 2017-11-11 VITALS — BP 130/60 | HR 65 | Temp 98.6°F | Wt 235.5 lb

## 2017-11-11 DIAGNOSIS — Q899 Congenital malformation, unspecified: Secondary | ICD-10-CM

## 2017-11-11 DIAGNOSIS — M5432 Sciatica, left side: Secondary | ICD-10-CM

## 2017-11-11 MED ORDER — GABAPENTIN 100 MG PO CAPS: 100.0000 mg | ORAL_CAPSULE | Freq: Three times a day (TID) | ORAL | 3 refills | Status: DC

## 2017-11-11 NOTE — Progress Notes (Signed)
She had her band keratopathy surgery done at [facility].    Blood from umbilicus.  Incidentally noted.  Locally tender.  Oozed a little blood, she cleaned it and no sx in the meantime.  Not ttp now.  Single event, no return of sx.    Pins and needle feeling in her left leg.  H/o sx in the past, had gotten better in the past.  Then returned more recently, in the last month.  Still using her walker. Only on the L leg.  No trauma.  No falls.  No back pain.  No rash.  L lateral thigh and lower leg distribution.  Not medial.  Does go below the knee.  No clear trigger for recent events.    She always has lower L spine midline pain.  H/o L spine fx in the distant past.    Meds, vitals, and allergies reviewed.   ROS: Per HPI unless specifically indicated in ROS section   nad ncat rrr ctab abd soft Umbilicus with minimal discharge, no redness, not ttp Back w/o midline pain SLR neg.   Tingling in L5 distribution on L leg.

## 2017-11-11 NOTE — Patient Instructions (Addendum)
Keep your belly button dry and update me as needed.  Likely was just local irritation and chapped skin.   Julie Phillips will call about your referral. Use gabapentin as needed and see if that helps with the leg discomfort.  Take care.  Glad to see you.

## 2017-11-13 DIAGNOSIS — M5432 Sciatica, left side: Secondary | ICD-10-CM | POA: Insufficient documentation

## 2017-11-13 DIAGNOSIS — Q899 Congenital malformation, unspecified: Secondary | ICD-10-CM | POA: Insufficient documentation

## 2017-11-13 NOTE — Assessment & Plan Note (Signed)
D/w pt to keep umbilicus dry and update me as needed.  Likely was just local irritation and chapped skin.  No sign of infection now.

## 2017-11-13 NOTE — Assessment & Plan Note (Signed)
Use gabapentin as needed to see if that helps with the leg discomfort. Routine cautions d/w pt.  Refer for PT.  Anatomy and dermatome map d/w pt.  Okay for outpatient f/u.  No weakness.

## 2017-11-18 DIAGNOSIS — M545 Low back pain: Secondary | ICD-10-CM | POA: Diagnosis not present

## 2018-01-04 DIAGNOSIS — M545 Low back pain: Secondary | ICD-10-CM | POA: Diagnosis not present

## 2018-01-09 DIAGNOSIS — M545 Low back pain: Secondary | ICD-10-CM | POA: Diagnosis not present

## 2018-01-11 DIAGNOSIS — M545 Low back pain: Secondary | ICD-10-CM | POA: Diagnosis not present

## 2018-01-16 DIAGNOSIS — M545 Low back pain: Secondary | ICD-10-CM | POA: Diagnosis not present

## 2018-01-18 DIAGNOSIS — M545 Low back pain: Secondary | ICD-10-CM | POA: Diagnosis not present

## 2018-01-23 ENCOUNTER — Ambulatory Visit: Payer: PPO | Admitting: Podiatry

## 2018-01-23 DIAGNOSIS — M545 Low back pain: Secondary | ICD-10-CM | POA: Diagnosis not present

## 2018-01-25 DIAGNOSIS — M545 Low back pain: Secondary | ICD-10-CM | POA: Diagnosis not present

## 2018-01-30 ENCOUNTER — Ambulatory Visit: Payer: PPO | Admitting: Podiatry

## 2018-01-30 ENCOUNTER — Encounter: Payer: Self-pay | Admitting: Podiatry

## 2018-01-30 DIAGNOSIS — B351 Tinea unguium: Secondary | ICD-10-CM

## 2018-01-30 DIAGNOSIS — M79676 Pain in unspecified toe(s): Secondary | ICD-10-CM

## 2018-01-30 DIAGNOSIS — M545 Low back pain: Secondary | ICD-10-CM | POA: Diagnosis not present

## 2018-01-30 NOTE — Progress Notes (Signed)
She presents today chief complaint of painful elongated toenails.  Objective: Vital signs are stable alert and oriented x3.  Pulses are palpable.  Neurologic sensorium is intact.  Deep tendon reflexes are intact.  Muscle strength was 5/5 dorsiflexors plantar flexors inverters everters onto the musculature is intact.  Toenails are long thick yellow dystrophic click mycotic painful palpation 1 through 5 bilaterally.  Assessment: Pain in limb secondary to onychomycosis.  Plan: Debridement of toenails 1 through 5 bilateral cover service secondary to pain.  Follow-up with [place] in 3 months.

## 2018-02-01 DIAGNOSIS — M545 Low back pain: Secondary | ICD-10-CM | POA: Diagnosis not present

## 2018-02-06 DIAGNOSIS — M545 Low back pain: Secondary | ICD-10-CM | POA: Diagnosis not present

## 2018-05-03 ENCOUNTER — Ambulatory Visit: Payer: PPO | Admitting: Podiatry

## 2018-05-03 ENCOUNTER — Telehealth: Payer: Self-pay | Admitting: Cardiovascular Disease

## 2018-05-03 NOTE — Telephone Encounter (Signed)
Pt calling stating last night 4 hours  She states she had SOB and pains in her chest And she states it went away after a bowl movement but is worried for she looked online and did not like what she saw  She states she is not having CP/SOB at the moment but would like a call back  Would like a call back about this  Please advise

## 2018-05-03 NOTE — Telephone Encounter (Signed)
Spoke with patient and reviewed her symptoms. She reports that she had some sharp stabbing pains which woke her up along with pain in her arm, shoulder, and fingers. She states that after bowel movement the pain went away. Reviewed signs and symptoms to monitor for that would require immediate attention and/or evaluation in the ED. She verbalized understanding with no further questions at this time.

## 2018-06-26 ENCOUNTER — Other Ambulatory Visit: Payer: Self-pay | Admitting: Family Medicine

## 2018-06-26 DIAGNOSIS — M8580 Other specified disorders of bone density and structure, unspecified site: Secondary | ICD-10-CM

## 2018-06-26 DIAGNOSIS — I10 Essential (primary) hypertension: Secondary | ICD-10-CM

## 2018-06-27 DIAGNOSIS — Z961 Presence of intraocular lens: Secondary | ICD-10-CM | POA: Diagnosis not present

## 2018-06-28 ENCOUNTER — Ambulatory Visit: Payer: PPO

## 2018-06-30 ENCOUNTER — Ambulatory Visit (INDEPENDENT_AMBULATORY_CARE_PROVIDER_SITE_OTHER): Payer: PPO

## 2018-06-30 VITALS — BP 118/74 | HR 70 | Temp 98.0°F | Ht 62.0 in | Wt 247.2 lb

## 2018-06-30 DIAGNOSIS — M8580 Other specified disorders of bone density and structure, unspecified site: Secondary | ICD-10-CM | POA: Diagnosis not present

## 2018-06-30 DIAGNOSIS — Z0000 Encounter for general adult medical examination without abnormal findings: Secondary | ICD-10-CM | POA: Diagnosis not present

## 2018-06-30 DIAGNOSIS — I10 Essential (primary) hypertension: Secondary | ICD-10-CM | POA: Diagnosis not present

## 2018-06-30 LAB — COMPREHENSIVE METABOLIC PANEL
ALT: 15 U/L (ref 0–35)
AST: 18 U/L (ref 0–37)
Albumin: 3.7 g/dL (ref 3.5–5.2)
Alkaline Phosphatase: 98 U/L (ref 39–117)
BUN: 11 mg/dL (ref 6–23)
CHLORIDE: 104 meq/L (ref 96–112)
CO2: 28 meq/L (ref 19–32)
CREATININE: 0.72 mg/dL (ref 0.40–1.20)
Calcium: 9.1 mg/dL (ref 8.4–10.5)
GFR: 82.08 mL/min (ref >60.00)
GLUCOSE: 101 mg/dL — AB (ref 70–99)
POTASSIUM: 4.5 meq/L (ref 3.5–5.1)
SODIUM: 137 meq/L (ref 135–145)
Total Bilirubin: 0.4 mg/dL (ref 0.2–1.2)
Total Protein: 7.3 g/dL (ref 6.0–8.3)

## 2018-06-30 LAB — LIPID PANEL
CHOL/HDL RATIO: 4
Cholesterol: 197 mg/dL (ref 0–200)
HDL: 44.6 mg/dL (ref >39.00)
LDL CALC: 133 mg/dL — AB (ref 0–99)
NONHDL: 152.33
Triglycerides: 95 mg/dL (ref 0.0–149.0)
VLDL: 19 mg/dL (ref 0.0–40.0)

## 2018-06-30 LAB — VITAMIN D 25 HYDROXY (VIT D DEFICIENCY, FRACTURES): VITD: 17.47 ng/mL — ABNORMAL LOW (ref 30.00–100.00)

## 2018-06-30 LAB — TSH: TSH: 1.87 u[IU]/mL (ref 0.35–4.50)

## 2018-06-30 NOTE — Progress Notes (Signed)
Subjective:   Julie Phillips is a 84 y.o. female who presents for Medicare Annual (Subsequent) preventive examination.  Review of Systems:  N/A Cardiac Risk Factors include: advanced age (>76men, >34 women);diabetes mellitus;dyslipidemia;obesity (BMI >30kg/m2);hypertension     Objective:     Vitals: BP 118/74 (BP Location: Right Arm, Patient Position: Sitting, Cuff Size: Normal)   Pulse 70   Temp 98 F (36.7 C) (Oral)   Ht 5\' 2"  (1.575 m) Comment: shoes  Wt 247 lb 4 oz (112.2 kg)   SpO2 97%   BMI 45.22 kg/m   Body mass index is 45.22 kg/m.  Advanced Directives 06/30/2018 06/22/2017 06/17/2016  Does Patient Have a Medical Advance Directive? Yes Yes Yes  Type of Paramedic of [place];Living will [place];Living will [place];Living will  Does patient want to make changes to medical advance directive? - - No - Patient declined  Copy of [place] in Chart? Yes Yes -    Tobacco Social History   Tobacco Use  Smoking Status Former Smoker  . Packs/day: 2.00  . Years: 20.00  . Pack years: 40.00  . Types: Cigarettes  . Last attempt to quit: 12/14/1971  . Years since quitting: 46.5  Smokeless Tobacco Never Used     Counseling given: No   Clinical Intake:  Pre-visit preparation completed: Yes  Pain : No/denies pain Pain Score: 0-No pain     Nutritional Status: BMI > 30  Obese Nutritional Risks: None Diabetes: Yes CBG done?: No Did pt. bring in CBG monitor from home?: No  How often do you need to have someone help you when you read instructions, pamphlets, or other written materials from your doctor or pharmacy?: 1 - Never What is the last grade level you completed in school?: 12th grade + 1 yr college  Interpreter Needed?: No  Comments: pt lives alone Information entered by :: LPinson, LPN  Past Medical History:  Diagnosis Date  . AF (atrial fibrillation) ([place])   . Fall   .  GERD (gastroesophageal reflux disease)   . History of chicken pox   . Hx of colonic polyps   . Hyperlipidemia   . Hypertension   . Hypothyroidism   . Keratopathy 2018   R eye  . LVH (left ventricular hypertrophy)    Past Surgical History:  Procedure Laterality Date  . CARDIOVASCULAR STRESS TEST  2011   normal   . keratopathy     Family History  Problem Relation Age of Onset  . Heart disease Mother   . Heart disease Father   . Breast cancer Sister   . Breast cancer Daughter   . Colon cancer Neg Hx    Social History   Socioeconomic History  . Marital status: Widowed    Spouse name: Not on file  . Number of children: Not on file  . Years of education: Not on file  . Highest education level: Not on file  Occupational History  . Occupation: Retired Armed forces technical officer: RETIRED  Social Needs  . Financial resource strain: Not on file  . Food insecurity:    Worry: Not on file    Inability: Not on file  . Transportation needs:    Medical: Not on file    Non-medical: Not on file  Tobacco Use  . Smoking status: Former Smoker    Packs/day: 2.00    Years: 20.00    Pack years: 40.00  Types: Cigarettes    Last attempt to quit: 12/14/1971    Years since quitting: 46.5  . Smokeless tobacco: Never Used  Substance and Sexual Activity  . Alcohol use: No  . Drug use: No  . Sexual activity: Never  Lifestyle  . Physical activity:    Days per week: Not on file    Minutes per session: Not on file  . Stress: Not on file  Relationships  . Social connections:    Talks on phone: Not on file    Gets together: Not on file    Attends religious service: Not on file    Active member of club or organization: Not on file    Attends meetings of clubs or organizations: Not on file    Relationship status: Not on file  Other Topics Concern  . Not on file  Social History Narrative   Divorced   Education:  [employer]   8 pregnancies, 6 live births   LMP: 1985   Lives  in independent living.      Outpatient Encounter Medications as of 06/30/2018  Medication Sig  . Cyanocobalamin (VITAMIN B-12 PO) Take 2,500 mg by mouth daily.  . diphenhydrAMINE (BENADRYL) 25 mg capsule Take 1 capsule (25 mg total) by mouth daily as needed.  . gabapentin (NEURONTIN) 100 MG capsule Take 1 capsule (100 mg total) by mouth 3 (three) times daily. (Patient taking differently: Take 100 mg by mouth as needed. )  . levothyroxine (SYNTHROID, LEVOTHROID) 125 MCG tablet Take 1 tablet (125 mcg total) by mouth daily. Except for 0.5 tab on Sunday  . loratadine (CLARITIN) 10 MG tablet Take 1 tablet (10 mg total) by mouth daily as needed.  Marland Kitchen losartan (COZAAR) 100 MG tablet Take 1 tablet (100 mg total) by mouth daily.  . Lutein-Zeaxanthin 15-0.7 MG CAPS Take by mouth. Take 20 mg in the PO and 45 mg in the AM  . metoprolol succinate (TOPROL-XL) 50 MG 24 hr tablet Take 1 tablet (50 mg total) by mouth daily. Take with or immediately following a meal.  . Multiple Vitamins-Minerals (VISION FORMULA) TABS Take by mouth daily.  Marland Kitchen omeprazole (PRILOSEC) 20 MG capsule Take 20 mg by mouth daily.  [name] Glycol-Propyl Glycol (SYSTANE) 0.4-0.3 % GEL ophthalmic gel Place 1 application into both eyes.  . potassium chloride (K-DUR) 10 MEQ tablet Take 1 tablet (10 mEq total) by mouth daily.  Marland Kitchen triamcinolone ointment (KENALOG) 0.1 %   . [DISCONTINUED] Multiple Vitamins-Minerals (EYE VITAMINS PO) Take 1 capsule by mouth daily.  . [DISCONTINUED] ofloxacin (OCUFLOX) 0.3 % ophthalmic solution Place 1 drop into the right eye 4 (four) times daily.  . [DISCONTINUED] prednisoLONE acetate (PRED FORTE) 1 % ophthalmic suspension Place 1 drop into the right eye 2 (two) times daily.  . [DISCONTINUED] timolol (BETIMOL) 0.5 % ophthalmic solution Place 1 drop into the right eye 2 (two) times daily.   No facility-administered encounter medications on file as of 06/30/2018.     Activities of Daily Living In your present  state of health, do you have any difficulty performing the following activities: 06/30/2018  Hearing? Y  Vision? N  Difficulty concentrating or making decisions? N  Walking or climbing stairs? Y  Dressing or bathing? N  Doing errands, shopping? N  Preparing Food and eating ? N  Using the Toilet? N  In the past six months, have you accidently leaked urine? N  Do you have problems with loss of bowel control? N  Managing your  Medications? N  Managing your Finances? N  Housekeeping or managing your Housekeeping? N  Some recent data might be hidden    Patient Care Team: Tonia Ghent, MD as PCP - General (Family Medicine) Minna Merritts, MD as Consulting Physician (Cardiology) Doyce Para, MD as Consulting Physician (Ophthalmology) Estill Cotta, MD as Consulting Physician (Ophthalmology)    Assessment:   This is a routine wellness examination for Paxtyn.   Hearing Screening   125Hz  250Hz  500Hz  1000Hz  2000Hz  3000Hz  4000Hz  6000Hz  8000Hz   Right ear:   0 0 40  0    Left ear:   0 0 40  0    Comments: Per pt, approved to get hearing aids  Vision Screening Comments: Vision exam in Jul 2019 with Dr. Sandra Cockayne    Exercise Activities and Dietary recommendations Current Exercise Habits: The patient does not participate in regular exercise at present, Exercise limited by: None identified  Goals    . Patient Stated     Starting 06/30/2018, I will continue to take medications as prescribed.        Fall Risk Fall Risk  06/30/2018 06/22/2017 06/17/2016 01/14/2015 01/03/2014  Falls in the past year? Yes Yes Yes Yes No  Comment 2 falls due to tripping over feet - - - -  Number falls in past yr: 2 or more 2 or more 2 or more - -  Injury with Fall? Yes No - - -  Risk Factor Category  High Fall Risk - High Fall Risk - -  Risk for fall due to : Impaired balance/gait;Impaired mobility History of fall(s) Impaired mobility;Impaired balance/gait - -  Follow up - Falls prevention discussed  Falls evaluation completed;Education provided - -  Comment - Patient moved into a new apartment and has not had any fall since moving in December 2017. - - -   Depression Screen PHQ 2/9 Scores 06/30/2018 06/22/2017 06/17/2016 01/14/2015  PHQ - 2 Score 0 1 0 0  PHQ- 9 Score 0 - - -     Cognitive Function MMSE - Mini Mental State Exam 06/30/2018 06/22/2017 06/17/2016  Orientation to time 5 5 5   Orientation to Place 5 5 5   Registration 3 3 3   Attention/ Calculation 0 0 0  Recall 2 unable to recall 1 of 3 words 3 3  Language- name 2 objects 0 0 0  Language- repeat 1 1 1   Language- follow 3 step command 3 3 3   Language- read & follow direction 0 0 0  Write a sentence 0 0 0  Copy design 0 0 0  Total score 19 20 20      PLEASE NOTE: A Mini-Cog screen was completed. Maximum score is 20. A value of 0 denotes this part of [place] MMSE was not completed or the patient failed this part of the Mini-Cog screening.   Mini-Cog Screening Orientation to Time - Max 5 pts Orientation to Place - Max 5 pts Registration - Max 3 pts Recall - Max 3 pts Language Repeat - Max 1 pts Language Follow 3 Step Command - Max 3 pts c    Immunization History  Administered Date(s) Administered  . Influenza Split 10/04/2012  . Influenza,inj,Quad PF,6+ Mos 10/08/2013, 09/25/2015, 09/14/2016, 10/06/2017  . Influenza-Unspecified 09/13/2011, 08/13/2014  . Pneumococcal Conjugate-13 08/13/2014  . Pneumococcal Polysaccharide-23 12/13/2010  . Td 12/13/2010    Screening Tests Health Maintenance  Topic Date Due  . INFLUENZA VACCINE  07/13/2018  . TETANUS/TDAP  12/13/2020  . DEXA SCAN  Completed  .  PNA vac Low Risk Adult  Completed       Plan:     I have personally reviewed, addressed, and noted the following in the patient's chart:  A. Medical and social history B. Use of alcohol, tobacco or illicit drugs  C. Current medications and supplements D. Functional ability and status E.  Nutritional status F.    Physical activity G. Advance directives H. List of other physicians I.  Hospitalizations, surgeries, and ER visits in previous 12 months J.  [place] to include hearing, vision, cognitive, depression L. Referrals and appointments - none  In addition, I have reviewed and discussed with patient certain preventive protocols, quality metrics, and best practice recommendations. A written personalized care plan for preventive services as well as general preventive health recommendations were provided to patient.  See attached scanned questionnaire for additional information.   Signed,   Lindell Noe, MHA, BS, LPN Health Coach

## 2018-06-30 NOTE — Patient Instructions (Signed)
Julie Phillips , Thank you for taking time to come for your Medicare Wellness Visit. I appreciate your ongoing commitment to your health goals. Please review the following plan we discussed and let me know if I can assist you in the future.   These are the goals we discussed: Goals    . Patient Stated     Starting 06/30/2018, I will continue to take medications as prescribed.        This is a list of the screening recommended for you and due dates:  Health Maintenance  Topic Date Due  . Flu Shot  07/13/2018  . Tetanus Vaccine  12/13/2020  . DEXA scan (bone density measurement)  Completed  . Pneumonia vaccines  Completed   Preventive Care for Adults  A healthy lifestyle and preventive care can promote health and wellness. Preventive health guidelines for adults include the following key practices.  . A routine yearly physical is a good way to check with your health care provider about your health and preventive screening. It is a chance to share any concerns and updates on your health and to receive a thorough exam.  . Visit your dentist for a routine exam and preventive care every 6 months. Brush your teeth twice a day and floss once a day. Good oral hygiene prevents tooth decay and gum disease.  . The frequency of eye exams is based on your age, health, family medical history, use  of contact lenses, and other factors. Follow your health care provider's recommendations for frequency of eye exams.  . Eat a healthy diet. Foods like vegetables, fruits, whole grains, low-fat dairy products, and lean protein foods contain the nutrients you need without too many calories. Decrease your intake of foods high in solid fats, added sugars, and salt. Eat the right amount of calories for you. Get information about a proper diet from your health care provider, if necessary.  . Regular physical exercise is one of the most important things you can do for your health. Most adults should get at least 150  minutes of moderate-intensity exercise (any activity that increases your heart rate and causes you to sweat) each week. In addition, most adults need muscle-strengthening exercises on 2 or more days a week.  Silver Sneakers may be a benefit available to you. To determine eligibility, you may visit the website: www.silversneakers.com or contact program at 920-161-3202 Mon-Fri between 8AM-8PM.   . Maintain a healthy weight. The body mass index (BMI) is a screening tool to identify possible weight problems. It provides an estimate of body fat based on height and weight. Your health care provider can find your BMI and can help you achieve or maintain a healthy weight.   For adults 20 years and older: ? A BMI below 18.5 is considered underweight. ? A BMI of 18.5 to 24.9 is normal. ? A BMI of 25 to 29.9 is considered overweight. ? A BMI of 30 and above is considered obese.   . Maintain normal blood lipids and cholesterol levels by exercising and minimizing your intake of saturated fat. Eat a balanced diet with plenty of fruit and vegetables. Blood tests for lipids and cholesterol should begin at age 63 and be repeated every 5 years. If your lipid or cholesterol levels are high, you are over 50, or you are at high risk for heart disease, you may need your cholesterol levels checked more frequently. Ongoing high lipid and cholesterol levels should be treated with medicines if diet and exercise  are not working.  . If you smoke, find out from your health care provider how to quit. If you do not use tobacco, please do not start.  . If you choose to drink alcohol, please do not consume more than 2 drinks per day. One drink is considered to be 12 ounces (355 mL) of beer, 5 ounces (148 mL) of wine, or 1.5 ounces (44 mL) of liquor.  . If you are 61-51 years old, ask your health care provider if you should take aspirin to prevent strokes.  . Use sunscreen. Apply sunscreen liberally and repeatedly throughout  the day. You should seek shade when your shadow is shorter than you. Protect yourself by wearing long sleeves, pants, a wide-brimmed hat, and sunglasses year round, whenever you are outdoors.  . Once a month, do a whole body skin exam, using a mirror to look at the skin on your back. Tell your health care provider of new moles, moles that have irregular borders, moles that are larger than a pencil eraser, or moles that have changed in shape or color.

## 2018-06-30 NOTE — Progress Notes (Signed)
PCP notes:   Health maintenance:  No gaps identified.  Abnormal screenings:   Hearing - failed  Hearing Screening   125Hz  250Hz  500Hz  1000Hz  2000Hz  3000Hz  4000Hz  6000Hz  8000Hz   Right ear:   0 0 40  0    Left ear:   0 0 40  0    Comments: Per pt, approved to get hearing aids  Fall risk - hx of multiple falls Fall Risk  06/30/2018 06/22/2017 06/17/2016 01/14/2015 01/03/2014  Falls in the past year? Yes Yes Yes Yes No  Comment 2 falls due to tripping over feet - - - -  Number falls in past yr: 2 or more 2 or more 2 or more - -  Injury with Fall? Yes No - - -  Risk Factor Category  High Fall Risk - High Fall Risk - -  Risk for fall due to : Impaired balance/gait;Impaired mobility History of fall(s) Impaired mobility;Impaired balance/gait - -  Follow up - Falls prevention discussed Falls evaluation completed;Education provided - -  Comment - Patient moved into a new apartment and has not had any fall since moving in December 2017. - - -    Mini-Cog score:  MMSE - Mini Mental State Exam 06/30/2018 06/22/2017 06/17/2016  Orientation to time 5 5 5   Orientation to Place 5 5 5   Registration 3 3 3   Attention/ Calculation 0 0 0  Recall 2 3 3   Recall-comments unable to recall 1 of 3 words - -  Language- name 2 objects 0 0 0  Language- repeat 1 1 1   Language- follow 3 step command 3 3 3   Language- read & follow direction 0 0 0  Write a sentence 0 0 0  Copy design 0 0 0  Total score 19 20 20    Patient concerns:   None  Nurse concerns:  None  Next PCP appt:   07/06/18 @ 0945

## 2018-07-03 ENCOUNTER — Other Ambulatory Visit: Payer: Self-pay | Admitting: Family Medicine

## 2018-07-06 ENCOUNTER — Ambulatory Visit (INDEPENDENT_AMBULATORY_CARE_PROVIDER_SITE_OTHER): Payer: PPO | Admitting: Family Medicine

## 2018-07-06 VITALS — BP 118/74 | HR 70 | Temp 98.0°F | Ht 62.0 in | Wt 247.2 lb

## 2018-07-06 DIAGNOSIS — R739 Hyperglycemia, unspecified: Secondary | ICD-10-CM

## 2018-07-06 DIAGNOSIS — R21 Rash and other nonspecific skin eruption: Secondary | ICD-10-CM

## 2018-07-06 DIAGNOSIS — Z9181 History of falling: Secondary | ICD-10-CM

## 2018-07-06 DIAGNOSIS — Z7189 Other specified counseling: Secondary | ICD-10-CM

## 2018-07-06 DIAGNOSIS — I480 Paroxysmal atrial fibrillation: Secondary | ICD-10-CM

## 2018-07-06 DIAGNOSIS — I10 Essential (primary) hypertension: Secondary | ICD-10-CM | POA: Diagnosis not present

## 2018-07-06 DIAGNOSIS — E559 Vitamin D deficiency, unspecified: Secondary | ICD-10-CM | POA: Diagnosis not present

## 2018-07-06 DIAGNOSIS — E038 Other specified hypothyroidism: Secondary | ICD-10-CM

## 2018-07-06 DIAGNOSIS — Z0000 Encounter for general adult medical examination without abnormal findings: Secondary | ICD-10-CM

## 2018-07-06 MED ORDER — LOSARTAN POTASSIUM 100 MG PO TABS: 100.0000 mg | ORAL_TABLET | Freq: Every day | ORAL | 3 refills | Status: DC

## 2018-07-06 MED ORDER — LEVOTHYROXINE SODIUM 125 MCG PO TABS: 125.0000 ug | ORAL_TABLET | Freq: Every day | ORAL | 3 refills | Status: DC

## 2018-07-06 MED ORDER — METOPROLOL SUCCINATE ER 50 MG PO TB24: 50.0000 mg | ORAL_TABLET | Freq: Every day | ORAL | 3 refills | Status: DC

## 2018-07-06 MED ORDER — DESOXIMETASONE 0.25 % EX CREA: 1.0000 "application " | TOPICAL_CREAM | Freq: Two times a day (BID) | CUTANEOUS | 1 refills | Status: DC | PRN

## 2018-07-06 MED ORDER — VITAMIN D 50 MCG (2000 UT) PO TABS: 2000.0000 [IU] | ORAL_TABLET | Freq: Every day | ORAL | Status: DC

## 2018-07-06 NOTE — Progress Notes (Signed)
She is getting hearing aids.  I'll defer.  D/w pt.   Recall rechecked today.  3/3 recall today.   Advance directive- daughter Laurita Quint if patient were incapacitated.  DNR reaffirmed 07/06/18 Mammogram declined.  D/w pt.  I'll defer to patient.  DEXA declined.  I'll defer to patient.  Tetanus 2012 PNA up to date.  Flu prev done.   shingrix d/w pt  Out of stock.  Pap and colon cancer screening not due.   Fall cautions d/w pt.  She does better with walker.    Hypertension:    Using medication without problems or lightheadedness: yes Chest pain with exertion:no, see below.  Edema:no Short of breath:no Labs d/w pt.   D/w pt about lower sugar options, labs d/w pt.    She has some radicular L arm and neck pain twice, not in months per patient report.  It happened around the time she was eating fish and chips both times, not exertional.  No Sx now.  D/w pt about trigger avoidance. she'll update me as needed.    Vit d low.  D/w pt about labs and replacement. See avs.   Hypothyroidism.  TSH wnl.  No ADE on med.  No neck mass.  No dysphagia.   No known episodes of AF in the last year.  Off anticoagulation as that is likely is safer, to be off med, d/w pt.  She agrees.    She has a rash on R shin that is treated with topical steroid and that does well.  Used intermittently.    PMH and SH reviewed  ROS: Per HPI unless specifically indicated in ROS section   Meds, vitals, and allergies reviewed.   GEN: nad, alert and oriented HEENT: mucous membranes moist NECK: supple w/o LA CV: rrr PULM: ctab, no inc wob ABD: soft, +bs EXT: no edema SKIN: Blanching rash on the R shin, doesn't appear infected.

## 2018-07-06 NOTE — Patient Instructions (Signed)
Add on vitamin D but don't change your meds otherwise.  Recheck vit D level in about 4 months.  nonfasting lab visit.  Take care.  Glad to see you.  Update me as needed.

## 2018-07-09 DIAGNOSIS — R739 Hyperglycemia, unspecified: Secondary | ICD-10-CM | POA: Insufficient documentation

## 2018-07-09 DIAGNOSIS — E559 Vitamin D deficiency, unspecified: Secondary | ICD-10-CM | POA: Insufficient documentation

## 2018-07-09 NOTE — Assessment & Plan Note (Signed)
She has a rash on R shin that is treated with topical steroid and that does well.  Used intermittently, continue as is.

## 2018-07-09 NOTE — Assessment & Plan Note (Signed)
Reasonable control.  Labs discussed with patient.  Continue as is.  She agrees. >25 minutes spent in face to face time with patient, >50% spent in counselling or coordination of care.

## 2018-07-09 NOTE — Assessment & Plan Note (Signed)
D/w pt about lower sugar options, labs d/w pt.

## 2018-07-09 NOTE — Assessment & Plan Note (Signed)
H/o.  No known episodes of AF in the last year.  Off anticoagulation as that is likely is safer, to be off med, d/w pt.  She agrees.   Continue as is.

## 2018-07-09 NOTE — Assessment & Plan Note (Signed)
She is getting hearing aids.  I'll defer.  D/w pt.   Recall rechecked today.  3/3 recall today.   Advance directive- daughter Laurita Quint if patient were incapacitated.  DNR reaffirmed 07/06/18 Mammogram declined.  D/w pt.  I'll defer to patient.  DEXA declined.  I'll defer to patient.  Tetanus 2012 PNA up to date.  Flu prev done.   shingrix d/w pt  Out of stock.  Pap and colon cancer screening not due.   Fall cautions d/w pt.  She does better with walker.

## 2018-07-09 NOTE — Assessment & Plan Note (Signed)
Advance directive- daughter Karen designated if patient were incapacitated. 

## 2018-07-09 NOTE — Assessment & Plan Note (Signed)
TSH wnl.  No ADE on med.  No neck mass.  No dysphagia. Continue as is.

## 2018-07-09 NOTE — Assessment & Plan Note (Signed)
Fall cautions discussed with patient.

## 2018-07-09 NOTE — Assessment & Plan Note (Signed)
Vit d low.  D/w pt about labs and replacement.

## 2018-07-14 NOTE — Progress Notes (Signed)
I reviewed health advisor's note, was available for consultation, and agree with documentation and plan.  

## 2018-08-17 ENCOUNTER — Ambulatory Visit: Payer: PPO | Admitting: Cardiovascular Disease

## 2018-08-17 ENCOUNTER — Encounter: Payer: Self-pay | Admitting: Cardiovascular Disease

## 2018-08-17 VITALS — BP 141/74 | HR 78 | Ht 61.0 in | Wt 237.5 lb

## 2018-08-17 DIAGNOSIS — I10 Essential (primary) hypertension: Secondary | ICD-10-CM | POA: Diagnosis not present

## 2018-08-17 DIAGNOSIS — E782 Mixed hyperlipidemia: Secondary | ICD-10-CM

## 2018-08-17 DIAGNOSIS — I480 Paroxysmal atrial fibrillation: Secondary | ICD-10-CM | POA: Diagnosis not present

## 2018-08-17 DIAGNOSIS — R0602 Shortness of breath: Secondary | ICD-10-CM | POA: Diagnosis not present

## 2018-08-17 NOTE — Patient Instructions (Addendum)
Call the office if you have more neck/arm/chest pain   Medication Instructions:   Try meclizine OTC for vertigo Lasts 4 to 6 hours  Labwork:  No new labs needed  Testing/Procedures:  No further testing at this time   Follow-Up: It was a pleasure seeing you in the office today. Please call us if you have new issues that need to be addressed before your next appt.  [phone]  Your physician wants you to follow-up in: 12 months.  You will receive a reminder letter in the mail two months in advance. If you don't receive a letter, please call our office to schedule the follow-up appointment.  If you need a refill on your cardiac medications before your next appointment, please call your pharmacy.  For educational health videos Log in to : [email or web address] Or : [email or web address], password : [email or web address]

## 2018-08-17 NOTE — Progress Notes (Signed)
Patient ID: Julie Phillips, female   DOB: August 27, 1932, 84 y.o.   MRN: [id number] Cardiology Office Note  Date:  08/17/2018   ID:  Julie Phillips, DOB 03/26/1932, MRN [id number]  PCP:  Julie Ghent, MD   Chief Complaint  Patient presents with  . OTHER    12 month f/u c/o intermittent chest discomfort. Meds reviewed verbally with pt.    HPI:  Julie Phillips is a pleasant 84 year old woman with remote history of  atrial fibrillation in 2007,  In [place] in setting of hypokalemia (did stress test/pharmacological study) low potassium by her report,  previously on warfarin, stopped, was falling alot hypertension  who presents for routine followup of her atrial fibrillation.  In follow-up today she denies any significant palpitations Weight running high, walks with a walker Leg weakness Rare flutter, had that her whole life. No other tachycardia concerning for atrial fibrillation No recent falls Right leg trace swelling, dependant edema  GERD better on omeprazole, does not take daily, takes PRN  "gong going off in her head", wakes up in the middle of the night, 1 time a month Etiology unclear Takes water, goes away  Total cholesterol typically around 200. She does not want a statin  End of June, was out dinner, reports that she developed severe chest , arm and neck pain Rare episodes, after food, fish and chips, Went away in 2 hours No further episodes since that time  EKG personally reviewed by myself on todays visit Shows NSR rate 78 bpm with no ST or T wave changes  Other past medical history She has tried statins in the past and had a problem with her "urine". Details are uncertain  Previous echocardiogram that showed mild MR and TR, normal LV systolic function  Stress test was normal showing no ischemia with normal ejection fraction . Carotid ultrasound showed minimal chronic plaquing  PMH:   has a past medical history of AF (atrial fibrillation) ([place]), Fall,  GERD (gastroesophageal reflux disease), History of chicken pox, colonic polyps, Hyperlipidemia, Hypertension, Hypothyroidism, Keratopathy (2018), and LVH (left ventricular hypertrophy).  PSH:    Past Surgical History:  Procedure Laterality Date  . CARDIOVASCULAR STRESS TEST  2011   normal   . keratopathy      Current Outpatient Medications  Medication Sig Dispense Refill  . Cholecalciferol (VITAMIN D) 2000 units tablet Take 1 tablet (2,000 Units total) by mouth daily.    . Cyanocobalamin (VITAMIN B-12 PO) Take 2,500 mg by mouth daily.    Marland Kitchen desoximetasone (TOPICORT) 0.25 % cream Apply 1 application topically 2 (two) times daily as needed. 30 g 1  . diphenhydrAMINE (BENADRYL) 25 mg capsule Take 1 capsule (25 mg total) by mouth daily as needed.    Marland Kitchen levothyroxine (SYNTHROID, LEVOTHROID) 125 MCG tablet Take 1 tablet (125 mcg total) by mouth daily. Except for 0.5 tab on Sunday 90 tablet 3  . loratadine (CLARITIN) 10 MG tablet Take 1 tablet (10 mg total) by mouth daily as needed.    Marland Kitchen losartan (COZAAR) 100 MG tablet Take 1 tablet (100 mg total) by mouth daily. 90 tablet 3  . Lutein-Zeaxanthin 15-0.7 MG CAPS Take by mouth. Take 20 mg in the PO and 45 mg in the AM    . metoprolol succinate (TOPROL-XL) 50 MG 24 hr tablet Take 1 tablet (50 mg total) by mouth daily. Take with or immediately following a meal. 90 tablet 3  . Multiple Vitamins-Minerals (VISION FORMULA) TABS Take by mouth daily.    Marland Kitchen  omeprazole (PRILOSEC) 20 MG capsule Take 20 mg by mouth daily.    Julie Phillips (Julie Phillips) 0.4-0.3 % GEL ophthalmic gel Place 1 application into both eyes.    . potassium chloride (K-DUR) 10 MEQ tablet TAKE 1 TABLET BY MOUTH ONCE DAILY 90 tablet 3   No current facility-administered medications for this visit.      Allergies:   Statins; Wheat; Amlodipine; Eucalyptus oil; and Lisinopril   Social History:  The patient  reports that she quit smoking about 46 years ago. Her smoking use  included cigarettes. She has a 40.00 pack-year smoking history. She has never used smokeless tobacco. She reports that she does not drink alcohol or use drugs.   Family History:   family history includes Breast cancer in her daughter and sister; Heart disease in her father and mother.    Review of Systems: Review of Systems  Constitutional: Negative.   Respiratory: Negative.   Cardiovascular: Negative.   Gastrointestinal: Negative.   Musculoskeletal: Negative.        Gait instability  Neurological: Negative.   Psychiatric/Behavioral: Negative.   All other systems reviewed and are negative.    PHYSICAL EXAM: VS:  BP (!) 141/74 (BP Location: Left Arm, Patient Position: Sitting, Cuff Size: Large)   Pulse 78   Ht 5\' 1"  (1.549 m)   Wt 237 lb 8 oz (107.7 kg)   BMI 44.88 kg/m  , BMI Body mass index is 44.88 kg/m.  Constitutional:  oriented to person, place, and time. No distress. Obese HENT:  Head: Normocephalic and atraumatic.  Eyes:  no discharge. No scleral icterus.  Neck: Normal range of motion. Neck supple. No JVD present.  Cardiovascular: Normal rate, regular rhythm, normal heart sounds and intact distal pulses. Exam reveals no gallop and no friction rub.  Trace nonpitting lower extremity edema No murmur heard. Pulmonary/Chest: Effort normal and breath sounds normal. No stridor. No respiratory distress.  no wheezes.  no rales.  no tenderness.  Abdominal: Soft.  no distension.  no tenderness.  Musculoskeletal: Normal range of motion.  no  tenderness or deformity.  Neurological:  normal muscle tone. Coordination normal. No atrophy Skin: Skin is warm and dry. No rash noted. not diaphoretic.  Psychiatric:  normal mood and affect. behavior is normal. Thought content normal.      Recent Labs: 06/30/2018: ALT 15; BUN 11; Creatinine, Ser 0.72; Potassium 4.5; Sodium 137; TSH 1.87    Lipid Panel Lab Results  Component Value Date   CHOL 197 06/30/2018   HDL 44.60 06/30/2018    LDLCALC 133 (H) 06/30/2018   TRIG 95.0 06/30/2018      Wt Readings from Last 3 Encounters:  08/17/18 237 lb 8 oz (107.7 kg)  07/06/18 247 lb 4 oz (112.2 kg)  06/30/18 247 lb 4 oz (112.2 kg)       ASSESSMENT AND PLAN:   Paroxysmal atrial fibrillation ([place]) - Plan: EKG 12-Lead Long discussion with her, atrial fibrillation in the setting of hypotension 12 years ago 2007 . possibly from being on HCTZ, Warfarin previously held for falls and risk of injury Will remain vigilant concerning her rhythm Denies any significant symptoms  HLD (hyperlipidemia) Does not want a statin, most recent cholesterol around 200 Again discussed with her  Essential hypertension Blood pressure is well controlled on today's visit. No changes made to the medications. Stable  Obesity Unable to exercise secondary to leg weakness and fall risk Recommended low carbohydrate diet  Leg swelling Dependent edema No strong  indication for diuretic at this time    Total encounter time more than 25 minutes  Greater than 50% was spent in counseling and coordination of care with the patient   Disposition:   F/U  12 months   Orders Placed This Encounter  Procedures  . EKG 12-Lead     Signed, Esmond Plants, M.D., Ph.D. 08/17/2018  [place], [place]

## 2019-02-26 DIAGNOSIS — Z961 Presence of intraocular lens: Secondary | ICD-10-CM | POA: Diagnosis not present

## 2019-02-26 DIAGNOSIS — H18421 Band keratopathy, right eye: Secondary | ICD-10-CM | POA: Diagnosis not present

## 2019-05-10 ENCOUNTER — Other Ambulatory Visit: Payer: Self-pay | Admitting: Family Medicine

## 2019-05-19 DIAGNOSIS — Z1159 Encounter for screening for other viral diseases: Secondary | ICD-10-CM | POA: Diagnosis not present

## 2019-05-19 DIAGNOSIS — Z01818 Encounter for other preprocedural examination: Secondary | ICD-10-CM | POA: Diagnosis not present

## 2019-05-19 DIAGNOSIS — H18421 Band keratopathy, right eye: Secondary | ICD-10-CM | POA: Diagnosis not present

## 2019-05-21 DIAGNOSIS — H18421 Band keratopathy, right eye: Secondary | ICD-10-CM | POA: Diagnosis not present

## 2019-07-09 ENCOUNTER — Ambulatory Visit (INDEPENDENT_AMBULATORY_CARE_PROVIDER_SITE_OTHER): Payer: PPO

## 2019-07-09 ENCOUNTER — Other Ambulatory Visit: Payer: Self-pay | Admitting: Family Medicine

## 2019-07-09 ENCOUNTER — Other Ambulatory Visit (INDEPENDENT_AMBULATORY_CARE_PROVIDER_SITE_OTHER): Payer: PPO

## 2019-07-09 VITALS — Ht 61.0 in | Wt 230.0 lb

## 2019-07-09 DIAGNOSIS — I10 Essential (primary) hypertension: Secondary | ICD-10-CM

## 2019-07-09 DIAGNOSIS — E038 Other specified hypothyroidism: Secondary | ICD-10-CM | POA: Diagnosis not present

## 2019-07-09 DIAGNOSIS — E559 Vitamin D deficiency, unspecified: Secondary | ICD-10-CM | POA: Diagnosis not present

## 2019-07-09 DIAGNOSIS — Z0000 Encounter for general adult medical examination without abnormal findings: Secondary | ICD-10-CM | POA: Diagnosis not present

## 2019-07-09 LAB — COMPREHENSIVE METABOLIC PANEL
ALT: 9 U/L (ref 0–35)
AST: 14 U/L (ref 0–37)
Albumin: 3.9 g/dL (ref 3.5–5.2)
Alkaline Phosphatase: 101 U/L (ref 39–117)
BUN: 13 mg/dL (ref 6–23)
CO2: 25 mEq/L (ref 19–32)
Calcium: 9.3 mg/dL (ref 8.4–10.5)
Chloride: 105 mEq/L (ref 96–112)
Creatinine, Ser: 0.69 mg/dL (ref 0.40–1.20)
GFR: 80.91 mL/min (ref >60.00)
Glucose, Bld: 93 mg/dL (ref 70–99)
Potassium: 4.3 mEq/L (ref 3.5–5.1)
Sodium: 138 mEq/L (ref 135–145)
Total Bilirubin: 0.5 mg/dL (ref 0.2–1.2)
Total Protein: 7.1 g/dL (ref 6.0–8.3)

## 2019-07-09 LAB — LIPID PANEL
Cholesterol: 205 mg/dL — ABNORMAL HIGH (ref 0–200)
HDL: 48.4 mg/dL (ref >39.00)
LDL Cholesterol: 137 mg/dL — ABNORMAL HIGH (ref 0–99)
NonHDL: 156.17
Total CHOL/HDL Ratio: 4
Triglycerides: 95 mg/dL (ref 0.0–149.0)
VLDL: 19 mg/dL (ref 0.0–40.0)

## 2019-07-09 LAB — VITAMIN D 25 HYDROXY (VIT D DEFICIENCY, FRACTURES): VITD: 34.7 ng/mL (ref 30.00–100.00)

## 2019-07-09 LAB — TSH: TSH: 0.29 u[IU]/mL — ABNORMAL LOW (ref 0.35–4.50)

## 2019-07-09 NOTE — Patient Instructions (Signed)
Julie Phillips , Thank you for taking time to come for your Medicare Wellness Visit. I appreciate your ongoing commitment to your health goals. Please review the following plan we discussed and let me know if I can assist you in the future.   Screening recommendations/referrals: Colonoscopy: not required Mammogram: not required Bone Density: 02/2015 Recommended yearly ophthalmology/optometry visit for glaucoma screening and checkup Recommended yearly dental visit for hygiene and checkup  Vaccinations: Influenza vaccine: 09/2017 Pneumococcal vaccine: 08/2014 Tdap vaccine: 12/2010 Shingles vaccine: discussed    Advanced directives: copy in chart  Conditions/risks identified: obese  Next appointment: 07/12/2019 at 12:00   Preventive Care 83 Years and Older, Female Preventive care refers to lifestyle choices and visits with your health care provider that can promote health and wellness. What does preventive care include?  A yearly physical exam. This is also called an annual well check.  Dental exams once or twice a year.  Routine eye exams. Ask your health care provider how often you should have your eyes checked.  Personal lifestyle choices, including:  Daily care of your teeth and gums.  Regular physical activity.  Eating a healthy diet.  Avoiding tobacco and drug use.  Limiting alcohol use.  Practicing safe sex.  Taking low-dose aspirin every day.  Taking vitamin and mineral supplements as recommended by your health care provider. What happens during an annual well check? The services and screenings done by your health care provider during your annual well check will depend on your age, overall health, lifestyle risk factors, and family history of disease. Counseling  Your health care provider may ask you questions about your:  Alcohol use.  Tobacco use.  Drug use.  Emotional well-being.  Home and relationship well-being.  Sexual activity.  Eating habits.   History of falls.  Memory and ability to understand (cognition).  Work and work Statistician.  Reproductive health. Screening  You may have the following tests or measurements:  Height, weight, and BMI.  Blood pressure.  Lipid and cholesterol levels. These may be checked every 5 years, or more frequently if you are over 49 years old.  Skin check.  Lung cancer screening. You may have this screening every year starting at age 106 if you have a 30-pack-year history of smoking and currently smoke or have quit within the past 15 years.  Fecal occult blood test (FOBT) of the stool. You may have this test every year starting at age 39.  Flexible sigmoidoscopy or colonoscopy. You may have a sigmoidoscopy every 5 years or a colonoscopy every 10 years starting at age 44.  Hepatitis C blood test.  Hepatitis B blood test.  Sexually transmitted disease (STD) testing.  Diabetes screening. This is done by checking your blood sugar (glucose) after you have not eaten for a while (fasting). You may have this done every 1-3 years.  Bone density scan. This is done to screen for osteoporosis. You may have this done starting at age 11.  Mammogram. This may be done every 1-2 years. Talk to your health care provider about how often you should have regular mammograms. Talk with your health care provider about your test results, treatment options, and if necessary, the need for more tests. Vaccines  Your health care provider may recommend certain vaccines, such as:  Influenza vaccine. This is recommended every year.  Tetanus, diphtheria, and acellular pertussis (Tdap, Td) vaccine. You may need a Td booster every 10 years.  Zoster vaccine. You may need this after age 31.  Pneumococcal 13-valent conjugate (PCV13) vaccine. One dose is recommended after age 21.  Pneumococcal polysaccharide (PPSV23) vaccine. One dose is recommended after age 71. Talk to your health care provider about which  screenings and vaccines you need and how often you need them. This information is not intended to replace advice given to you by your health care provider. Make sure you discuss any questions you have with your health care provider. Document Released: 12/26/2015 Document Revised: 08/18/2016 Document Reviewed: 09/30/2015 Elsevier Interactive Patient Education  2017 [place] Prevention in the Home Falls can cause injuries. They can happen to people of all ages. There are many things you can do to make your home safe and to help prevent falls. What can I do on the outside of my home?  Regularly fix the edges of walkways and driveways and fix any cracks.  Remove anything that might make you trip as you walk through a door, such as a raised step or threshold.  Trim any bushes or trees on the path to your home.  Use bright outdoor lighting.  Clear any walking paths of anything that might make someone trip, such as rocks or tools.  Regularly check to see if handrails are loose or broken. Make sure that both sides of any steps have handrails.  Any raised decks and porches should have guardrails on the edges.  Have any leaves, snow, or ice cleared regularly.  Use sand or salt on walking paths during winter.  Clean up any spills in your garage right away. This includes oil or grease spills. What can I do in the bathroom?  Use night lights.  Install grab bars by the toilet and in the tub and shower. Do not use towel bars as grab bars.  Use non-skid mats or decals in the tub or shower.  If you need to sit down in the shower, use a plastic, non-slip stool.  Keep the floor dry. Clean up any water that spills on the floor as soon as it happens.  Remove soap buildup in the tub or shower regularly.  Attach bath mats securely with double-sided non-slip rug tape.  Do not have throw rugs and other things on the floor that can make you trip. What can I do in the bedroom?  Use  night lights.  Make sure that you have a light by your bed that is easy to reach.  Do not use any sheets or blankets that are too big for your bed. They should not hang down onto the floor.  Have a firm chair that has side arms. You can use this for support while you get dressed.  Do not have throw rugs and other things on the floor that can make you trip. What can I do in the kitchen?  Clean up any spills right away.  Avoid walking on wet floors.  Keep items that you use a lot in easy-to-reach places.  If you need to reach something above you, use a strong step stool that has a grab bar.  Keep electrical cords out of the way.  Do not use floor polish or wax that makes floors slippery. If you must use wax, use non-skid floor wax.  Do not have throw rugs and other things on the floor that can make you trip. What can I do with my stairs?  Do not leave any items on the stairs.  Make sure that there are handrails on both sides of the stairs and use them.  Fix handrails that are broken or loose. Make sure that handrails are as long as the stairways.  Check any carpeting to make sure that it is firmly attached to the stairs. Fix any carpet that is loose or worn.  Avoid having throw rugs at the top or bottom of the stairs. If you do have throw rugs, attach them to the floor with carpet tape.  Make sure that you have a light switch at the top of the stairs and the bottom of the stairs. If you do not have them, ask someone to add them for you. What else can I do to help prevent falls?  Wear shoes that:  Do not have high heels.  Have rubber bottoms.  Are comfortable and fit you well.  Are closed at the toe. Do not wear sandals.  If you use a stepladder:  Make sure that it is fully opened. Do not climb a closed stepladder.  Make sure that both sides of the stepladder are locked into place.  Ask someone to hold it for you, if possible.  Clearly mark and make sure that you  can see:  Any grab bars or handrails.  First and last steps.  Where the edge of each step is.  Use tools that help you move around (mobility aids) if they are needed. These include:  Canes.  Walkers.  Scooters.  Crutches.  Turn on the lights when you go into a dark area. Replace any light bulbs as soon as they burn out.  Set up your furniture so you have a clear path. Avoid moving your furniture around.  If any of your floors are uneven, fix them.  If there are any pets around you, be aware of where they are.  Review your medicines with your doctor. Some medicines can make you feel dizzy. This can increase your chance of falling. Ask your doctor what other things that you can do to help prevent falls. This information is not intended to replace advice given to you by your health care provider. Make sure you discuss any questions you have with your health care provider. Document Released: 09/25/2009 Document Revised: 05/06/2016 Document Reviewed: 01/03/2015 Elsevier Interactive Patient Education  2017 [employer].

## 2019-07-09 NOTE — Progress Notes (Signed)
PCP notes:  Health Maintenance:  None  Abnormal Screenings:  Patient had 1 fall  Patient concerns:  None  Nurse concerns:  None  Next PCP appt.: 07/12/2019 at 12:00

## 2019-07-09 NOTE — Progress Notes (Addendum)
Subjective:   Julie Phillips is a 85 y.o. female who presents for Medicare Annual (Subsequent) preventive examination.  This visit type was conducted due to national recommendations for restrictions regarding the COVID-19 Pandemic (e.g. social distancing). This format is felt to be most appropriate for this patient at this time. All issues noted in this document were discussed and addressed. No physical exam was performed (except for noted visual exam findings with Video Visits). This patient, Julie Phillips, has given permission to perform this visit via telephone. Vital signs may be absent or patient reported.  Patient location:  At home  Nurse location:  At home    Review of Systems:  n/a Cardiac Risk Factors include: advanced age (>48men, >4 women);obesity (BMI >30kg/m2);sedentary lifestyle;dyslipidemia;hypertension     Objective:     Vitals: Ht 5\' 1"  (1.549 m) Comment: per patient  Wt 230 lb (104.3 kg) Comment: per patient  BMI 43.46 kg/m   Body mass index is 43.46 kg/m.  Advanced Directives 07/09/2019 06/30/2018 06/22/2017 06/17/2016  Does Patient Have a Medical Advance Directive? Yes Yes Yes Yes  Type of Paramedic of [employer];Living will [place];Living will [place];Living will [place];Living will  Does patient want to make changes to medical advance directive? - - - No - Patient declined  Copy of [place] in Chart? Yes - validated most recent copy scanned in chart (See row information) Yes Yes -    Tobacco Social History   Tobacco Use  Smoking Status Former Smoker  . Packs/day: 2.00  . Years: 20.00  . Pack years: 40.00  . Types: Cigarettes  . Quit date: 12/14/1971  . Years since quitting: 47.6  Smokeless Tobacco Never Used     Counseling given: Not Answered   Clinical Intake:  Pre-visit preparation completed: Yes  Pain : No/denies pain      Nutritional Status: BMI > 30  Obese Nutritional Risks: None Diabetes: No  How often do you need to have someone help you when you read instructions, pamphlets, or other written materials from your doctor or pharmacy?: 1 - Never What is the last grade level you completed in school?: 12th grade  Interpreter Needed?: No  Information entered by :: NAllen LPN  Past Medical History:  Diagnosis Date  . AF (atrial fibrillation) ([place])   . Fall   . GERD (gastroesophageal reflux disease)   . History of chicken pox   . Hx of colonic polyps   . Hyperlipidemia   . Hypertension   . Hypothyroidism   . Keratopathy 2018   R eye  . LVH (left ventricular hypertrophy)    Past Surgical History:  Procedure Laterality Date  . CARDIOVASCULAR STRESS TEST  2011   normal   . keratopathy     Family History  Problem Relation Age of Onset  . Heart disease Mother   . Heart disease Father   . Breast cancer Sister   . Breast cancer Daughter   . Colon cancer Neg Hx    Social History   Socioeconomic History  . Marital status: Widowed    Spouse name: Not on file  . Number of children: Not on file  . Years of education: Not on file  . Highest education level: Not on file  Occupational History  . Occupation: Retired Armed forces technical officer: RETIRED  Social Needs  . Financial resource strain: Not hard at all  . Food  insecurity    Worry: Never true    Inability: Never true  . Transportation needs    Medical: No    Non-medical: No  Tobacco Use  . Smoking status: Former Smoker    Packs/day: 2.00    Years: 20.00    Pack years: 40.00    Types: Cigarettes    Quit date: 12/14/1971    Years since quitting: 47.6  . Smokeless tobacco: Never Used  Substance and Sexual Activity  . Alcohol use: Not Currently  . Drug use: No  . Sexual activity: Not Currently  Lifestyle  . Physical activity    Days per week: 0 days    Minutes per session: 0 min  . Stress: Not at all  Relationships  . Social  Herbalist on phone: Not on file    Gets together: Not on file    Attends religious service: Not on file    Active member of club or organization: Not on file    Attends meetings of clubs or organizations: Not on file    Relationship status: Not on file  Other Topics Concern  . Not on file  Social History Narrative   Divorced   Education:  [employer]   8 pregnancies, 6 live births   LMP: 1985   Lives in independent living.      Outpatient Encounter Medications as of 07/09/2019  Medication Sig  . Cholecalciferol (VITAMIN D) 2000 units tablet Take 1 tablet (2,000 Units total) by mouth daily.  . Cyanocobalamin (VITAMIN B-12 PO) Take 2,500 mg by mouth daily.  Marland Kitchen desoximetasone (TOPICORT) 0.25 % cream Apply 1 application topically 2 (two) times daily as needed.  . diphenhydrAMINE (BENADRYL) 25 mg capsule Take 1 capsule (25 mg total) by mouth daily as needed.  Marland Kitchen levothyroxine (SYNTHROID) 125 MCG tablet TAKE 1 TABLET BY MOUTH ONCE DAILY EXCEPTON SUNDAY TAKE 1/2 TABLET.  Marland Kitchen loratadine (CLARITIN) 10 MG tablet Take 1 tablet (10 mg total) by mouth daily as needed.  Marland Kitchen losartan (COZAAR) 100 MG tablet TAKE 1 TABLET BY MOUTH ONCE DAILY  . Lutein-Zeaxanthin 15-0.7 MG CAPS Take by mouth. Take 20 mg in the PO and 45 mg in the AM  . metoprolol succinate (TOPROL-XL) 50 MG 24 hr tablet Take 1 tablet (50 mg total) by mouth daily. Take with or immediately following a meal.  . Multiple Vitamins-Minerals (VISION FORMULA) TABS Take by mouth daily.  Marland Kitchen omeprazole (PRILOSEC) 20 MG capsule Take 20 mg by mouth daily.  [name] Glycol-Propyl Glycol (SYSTANE) 0.4-0.3 % GEL ophthalmic gel Place 1 application into both eyes.  . potassium chloride (K-DUR) 10 MEQ tablet TAKE 1 TABLET BY MOUTH ONCE A DAY  . [DISCONTINUED] levothyroxine (SYNTHROID, LEVOTHROID) 125 MCG tablet Take 1 tablet (125 mcg total) by mouth daily. Except for 0.5 tab on Sunday  . [DISCONTINUED] losartan (COZAAR) 100 MG tablet Take 1  tablet (100 mg total) by mouth daily.   No facility-administered encounter medications on file as of 07/09/2019.     Activities of Daily Living In your present state of health, do you have any difficulty performing the following activities: 07/09/2019  Hearing? Y  Comment lost hearing aide in the house  Vision? Y  Difficulty concentrating or making decisions? N  Walking or climbing stairs? Y  Comment uses a walker  Dressing or bathing? N  Doing errands, shopping? N  Preparing Food and eating ? N  Using the Toilet? N  In the past six months,  have you accidently leaked urine? N  Do you have problems with loss of bowel control? N  Managing your Medications? N  Managing your Finances? N  Housekeeping or managing your Housekeeping? N  Some recent data might be hidden    Patient Care Team: Tonia Ghent, MD as PCP - General (Family Medicine) Minna Merritts, MD as Consulting Physician (Cardiology) Doyce Para, MD as Consulting Physician (Ophthalmology) Estill Cotta, MD as Consulting Physician (Ophthalmology)    Assessment:   This is a routine wellness examination for Julie Phillips.  Exercise Activities and Dietary recommendations Current Exercise Habits: The patient does not participate in regular exercise at present  Goals    . Increase physical activity (pt-stated)     Starting 06/22/2017, I will increase my walking with the goal of being able to walk to the [facility] and back and walk 1 lap around the track instead of driving.    . Patient Stated     Starting 06/30/2018, I will continue to take medications as prescribed.     . Patient Stated     07/09/2019, will take medications as prescribed. Has alarm to let know when time to take.       Fall Risk Fall Risk  07/09/2019 06/30/2018 06/22/2017 06/17/2016 01/14/2015  Falls in the past year? 1 Yes Yes Yes Yes  Comment - 2 falls due to tripping over feet - - -  Number falls in past yr: 0 2 or more 2 or more 2 or more -   Comment tripped over own foot - - - -  Injury with Fall? 0 Yes No - -  Risk Factor Category  - High Fall Risk - High Fall Risk -  Risk for fall due to : History of fall(s);Medication side effect Impaired balance/gait;Impaired mobility History of fall(s) Impaired mobility;Impaired balance/gait -  Follow up Falls evaluation completed;Falls prevention discussed - Falls prevention discussed Falls evaluation completed;Education provided -  Comment - - Patient moved into a new apartment and has not had any fall since moving in December 2017. - -   Is the patient's home free of loose throw rugs in walkways, pet beds, electrical cords, etc?   yes      Grab bars in the bathroom? yes      Handrails on the stairs?   yes      Adequate lighting?   yes  Timed Get Up and Go performed: n/a  Depression Screen PHQ 2/9 Scores 07/09/2019 06/30/2018 06/22/2017 06/17/2016  PHQ - 2 Score 0 0 1 0  PHQ- 9 Score 0 0 - -     Cognitive Function MMSE - Mini Mental State Exam 07/09/2019 06/30/2018 06/22/2017 06/17/2016  Orientation to time 5 5 5 5   Orientation to Place 5 5 5 5   Registration 3 3 3 3   Attention/ Calculation 5 0 0 0  Recall 3 2 3 3   Recall-comments - unable to recall 1 of 3 words - -  Language- name 2 objects 0 0 0 0  Language- repeat 1 1 1 1   Language- follow 3 step command 0 3 3 3   Language- read & follow direction 0 0 0 0  Write a sentence 0 0 0 0  Copy design 0 0 0 0  Total score 22 19 20 20    Mini Cog  Mini-Cog screen was completed. Maximum score is 22. A value of 0 denotes this part of the MMSE was not completed or the patient failed this part of the  Mini-Cog screening.       Immunization History  Administered Date(s) Administered  . Influenza Split 10/04/2012  . Influenza,inj,Quad PF,6+ Mos 10/08/2013, 09/25/2015, 09/14/2016, 10/06/2017  . Influenza-Unspecified 09/13/2011, 08/13/2014  . Pneumococcal Conjugate-13 08/13/2014  . Pneumococcal Polysaccharide-23 12/13/2010  . Td 12/13/2010     Qualifies for Shingles Vaccine? yes  Screening Tests Health Maintenance  Topic Date Due  . INFLUENZA VACCINE  07/14/2019  . TETANUS/TDAP  12/13/2020  . DEXA SCAN  Completed  . PNA vac Low Risk Adult  Completed    Cancer Screenings: Lung: Low Dose CT Chest recommended if Age 19-80 years, 30 pack-year currently smoking OR have quit w/in 15years. Patient does not qualify. Breast:  Up to date on Mammogram? Yes   Up to date of Bone Density/Dexa? Yes Colorectal: not required  Additional Screenings: : Hepatitis C Screening: n/a     Plan:   Patient goal to take meds as prescribed. She now has an alarm on her phone set to remind her.  I have personally reviewed and noted the following in the patient's chart:   . Medical and social history . Use of alcohol, tobacco or illicit drugs  . Current medications and supplements . Functional ability and status . Nutritional status . Physical activity . Advanced directives . List of other physicians . Hospitalizations, surgeries, and ER visits in previous 12 months . Vitals . Screenings to include cognitive, depression, and falls . Referrals and appointments  In addition, I have reviewed and discussed with patient certain preventive protocols, quality metrics, and best practice recommendations. A written personalized care plan for preventive services as well as general preventive health recommendations were provided to patient.     Julie Simmering, LPN  [id number]

## 2019-07-12 ENCOUNTER — Other Ambulatory Visit: Payer: Self-pay

## 2019-07-12 ENCOUNTER — Encounter: Payer: Self-pay | Admitting: Family Medicine

## 2019-07-12 ENCOUNTER — Ambulatory Visit (INDEPENDENT_AMBULATORY_CARE_PROVIDER_SITE_OTHER): Payer: PPO | Admitting: Family Medicine

## 2019-07-12 VITALS — BP 144/74 | HR 74 | Temp 97.9°F | Resp 20 | Ht 60.0 in | Wt 242.1 lb

## 2019-07-12 DIAGNOSIS — Z9181 History of falling: Secondary | ICD-10-CM

## 2019-07-12 DIAGNOSIS — M5432 Sciatica, left side: Secondary | ICD-10-CM | POA: Diagnosis not present

## 2019-07-12 DIAGNOSIS — I10 Essential (primary) hypertension: Secondary | ICD-10-CM | POA: Diagnosis not present

## 2019-07-12 DIAGNOSIS — E039 Hypothyroidism, unspecified: Secondary | ICD-10-CM

## 2019-07-12 DIAGNOSIS — K219 Gastro-esophageal reflux disease without esophagitis: Secondary | ICD-10-CM

## 2019-07-12 DIAGNOSIS — I480 Paroxysmal atrial fibrillation: Secondary | ICD-10-CM

## 2019-07-12 DIAGNOSIS — R21 Rash and other nonspecific skin eruption: Secondary | ICD-10-CM | POA: Diagnosis not present

## 2019-07-12 DIAGNOSIS — Z7189 Other specified counseling: Secondary | ICD-10-CM

## 2019-07-12 DIAGNOSIS — Z66 Do not resuscitate: Secondary | ICD-10-CM

## 2019-07-12 DIAGNOSIS — Z0000 Encounter for general adult medical examination without abnormal findings: Secondary | ICD-10-CM

## 2019-07-12 MED ORDER — GABAPENTIN 100 MG PO CAPS: 100.0000 mg | ORAL_CAPSULE | Freq: Three times a day (TID) | ORAL | 3 refills | Status: DC | PRN

## 2019-07-12 MED ORDER — LEVOTHYROXINE SODIUM 112 MCG PO TABS: 112.0000 ug | ORAL_TABLET | Freq: Every day | ORAL | 3 refills | Status: DC

## 2019-07-12 MED ORDER — OMEPRAZOLE 20 MG PO CPDR: 20.0000 mg | DELAYED_RELEASE_CAPSULE | Freq: Every day | ORAL | 3 refills | Status: DC

## 2019-07-12 MED ORDER — ASPIRIN EC 81 MG PO TBEC: 162.0000 mg | DELAYED_RELEASE_TABLET | Freq: Every day | ORAL | Status: DC

## 2019-07-12 NOTE — Patient Instructions (Addendum)
Recheck TSH 2 months after dose change, at a nonfasting lab visit.  Change to 167mcg a day when you use up your current supply.   Check with your insurance to see if they will cover the shingrix shot. I would get a flu shot each fall.   Take care.  Glad to see you.  Update me as needed.

## 2019-07-12 NOTE — Progress Notes (Signed)
Health maintenance d/w pt.   Advance directive- daughter Laurita Quint if patient were incapacitated.  DNR reaffirmed 07/12/19.  Mammogram declined. D/w pt.  I'll defer to patient.  DEXA declined.I'll defer to patient.  See after visit summary about vaccination.  Hypertension:    Using medication without problems or lightheadedness: yes Chest pain with exertion:no Edema: some R>L ankle edema but mild overall.   Short of breath:no  Hypothyroidism.  No dysphagia, no neck mass.  TSH slightly low.  D/w pt about labs.    Gabapentin only used as needed, h/o sciatica noted, used when she has lower back pain.    History of rash on R shin, recurrent, improved with topicort.    GERD controlled on PPI unless she eats a late supper/pizza.  Prilosec helps.  No ADE on med.  H/o AF year ago.  No episodes recently.  Off anticoagulation.   Pandemic considerations d/w pt.  She has help with transportation.  She is still using a rolling walker at baseline.  We adjusted the hight of the handles today to make it more comfortable for her height. Fall cautions d/w pt. Prev fall was due to a rug and she removed it in the meantime.    Meds, vitals, and allergies reviewed.   PMH and SH reviewed  ROS: Per HPI unless specifically indicated in ROS section   GEN: nad, alert and oriented HEENT: ncat NECK: supple w/o LA CV: rrr. PULM: ctab, no inc wob ABD: soft, +bs EXT: trace BLE edema SKIN: no acute rash

## 2019-07-18 DIAGNOSIS — K219 Gastro-esophageal reflux disease without esophagitis: Secondary | ICD-10-CM | POA: Insufficient documentation

## 2019-07-18 NOTE — Assessment & Plan Note (Signed)
Gabapentin only used as needed, h/o sciatica noted, used when she has lower back pain.   Continue as needed use.

## 2019-07-18 NOTE — Assessment & Plan Note (Signed)
Advance directive- daughter Laurita Quint if patient were incapacitated.  DNR reaffirmed 07/12/19.

## 2019-07-18 NOTE — Assessment & Plan Note (Signed)
Reasonable control.  No change in meds at this point.  She will update me if the swelling gets worse or if she has other concerns.

## 2019-07-18 NOTE — Assessment & Plan Note (Signed)
See above.  We adjusted her rolling walker.  Fall cautions discussed with patient.

## 2019-07-18 NOTE — Assessment & Plan Note (Signed)
  DNR reaffirmed 07/12/19.

## 2019-07-18 NOTE — Assessment & Plan Note (Signed)
GERD controlled on PPI unless she eats a late supper/pizza.  Prilosec helps.  No ADE on med.  Continue as is.  Discussed diet.

## 2019-07-18 NOTE — Assessment & Plan Note (Signed)
H/o AF year ago.  No episodes recently.  Off anticoagulation.  Reasonable to stay off anticoagulation at this point.

## 2019-07-18 NOTE — Assessment & Plan Note (Addendum)
No dysphagia, no neck mass.  TSH slightly low.  D/w pt about labs.  Recheck TSH 2 months after dose change, at a nonfasting lab visit.  Change to 125mcg a day when done with current supply of medication.  >25 minutes spent in face to face time with patient, >50% spent in counselling or coordination of care.

## 2019-07-18 NOTE — Assessment & Plan Note (Signed)
History of rash on R shin, recurrent, improved with topicort.  No adverse effect on medication.  Continue PRN use.

## 2019-07-18 NOTE — Assessment & Plan Note (Signed)
Advance directive- daughter Laurita Quint if patient were incapacitated.  DNR reaffirmed 07/12/19.  Mammogram declined. D/w pt.  I'll defer to patient.  DEXA declined.I'll defer to patient.

## 2019-07-27 ENCOUNTER — Telehealth: Payer: Self-pay

## 2019-07-27 NOTE — Telephone Encounter (Signed)
Pt left v/m that she has subsidized housing and needs a note to request a pedicure in place of appt with podiatrist. Pt said medicare allows 6 visits/ yr for pedicure. Pt needs note for subsidized housing to legitimize request. Pt request cb when reviewed by Dr Damita Dunnings.

## 2019-07-30 NOTE — Telephone Encounter (Signed)
Patient advised. Letter left at front desk for pick up.  

## 2019-07-30 NOTE — Telephone Encounter (Signed)
Letter done.  Please make sure it printed and I will sign it.  Thanks.

## 2019-08-15 ENCOUNTER — Other Ambulatory Visit: Payer: Self-pay | Admitting: Family Medicine

## 2019-10-14 NOTE — Progress Notes (Deleted)
Patient ID: Julie Phillips, female   DOB: 07-02-1932, 85 y.o.   MRN: [id number] Cardiology Office Note  Date:  10/14/2019   ID:  Julie Phillips, DOB 07-04-1932, MRN [id number]  PCP:  Tonia Ghent, MD   No chief complaint on file.   HPI:  Julie Phillips is a pleasant 85 year old woman with remote history of  atrial fibrillation in 2007,  In [place] in setting of hypokalemia (did stress test/pharmacological study) low potassium by her report,  previously on warfarin, stopped, was falling alot hypertension  who presents for routine followup of her atrial fibrillation.  In follow-up today she denies any significant palpitations Weight running high, walks with a walker Leg weakness Rare flutter, had that her whole life. No other tachycardia concerning for atrial fibrillation No recent falls Right leg trace swelling, dependant edema  GERD better on omeprazole, does not take daily, takes PRN  "gong going off in her head", wakes up in the middle of the night, 1 time a month Etiology unclear Takes water, goes away  Total cholesterol typically around 200. She does not want a statin  End of June, was out dinner, reports that she developed severe chest , arm and neck pain Rare episodes, after food, fish and chips, Went away in 2 hours No further episodes since that time  EKG personally reviewed by myself on todays visit Shows NSR rate 78 bpm with no ST or T wave changes  Other past medical history She has tried statins in the past and had a problem with her "urine". Details are uncertain  Previous echocardiogram that showed mild MR and TR, normal LV systolic function  Stress test was normal showing no ischemia with normal ejection fraction . Carotid ultrasound showed minimal chronic plaquing  PMH:   has a past medical history of AF (atrial fibrillation) ([place]), Fall, GERD (gastroesophageal reflux disease), History of chicken pox, colonic polyps, Hyperlipidemia,  Hypertension, Hypothyroidism, Keratopathy (2018), and LVH (left ventricular hypertrophy).  PSH:    Past Surgical History:  Procedure Laterality Date  . CARDIOVASCULAR STRESS TEST  2011   normal   . keratopathy      Current Outpatient Medications  Medication Sig Dispense Refill  . aspirin EC 81 MG tablet Take 2 tablets (162 mg total) by mouth daily.    . Cholecalciferol (VITAMIN D) 2000 units tablet Take 1 tablet (2,000 Units total) by mouth daily.    . Cyanocobalamin (VITAMIN B-12 PO) Take 2,500 mg by mouth daily.    Marland Kitchen desoximetasone (TOPICORT) 0.25 % cream Apply 1 application topically 2 (two) times daily as needed. 30 g 1  . diphenhydrAMINE (BENADRYL) 25 mg capsule Take 1 capsule (25 mg total) by mouth daily as needed.    . gabapentin (NEURONTIN) 100 MG capsule Take 1 capsule (100 mg total) by mouth 3 (three) times daily as needed. 270 capsule 3  . levothyroxine (SYNTHROID) 112 MCG tablet Take 1 tablet (112 mcg total) by mouth daily before breakfast. 90 tablet 3  . loratadine (CLARITIN) 10 MG tablet Take 1 tablet (10 mg total) by mouth daily as needed.    Marland Kitchen losartan (COZAAR) 100 MG tablet TAKE 1 TABLET BY MOUTH ONCE DAILY 90 tablet 3  . Lutein-Zeaxanthin 15-0.7 MG CAPS Take by mouth. Take 20 mg in the PO and 45 mg in the AM    . metoprolol succinate (TOPROL-XL) 50 MG 24 hr tablet TAKE 1 TABLET BY MOUTH ONCE DAILY *TAKE WITH OR IMMEDIATELY FOLLOWINGA MEAL* 90 tablet  3  . Multiple Vitamins-Minerals (VISION FORMULA) TABS Take by mouth daily.    Marland Kitchen omeprazole (PRILOSEC) 20 MG capsule Take 1 capsule (20 mg total) by mouth daily. 90 capsule 3  . Polyethyl Glycol-Propyl Glycol (SYSTANE) 0.4-0.3 % GEL ophthalmic gel Place 1 application into both eyes.    . potassium chloride (K-DUR) 10 MEQ tablet TAKE 1 TABLET BY MOUTH ONCE A DAY 90 tablet 0   No current facility-administered medications for this visit.      Allergies:   Statins, Wheat, Amlodipine, Eucalyptus oil, and Lisinopril   Social  History:  The patient  reports that she quit smoking about 47 years ago. Her smoking use included cigarettes. She has a 40.00 pack-year smoking history. She has never used smokeless tobacco. She reports previous alcohol use. She reports that she does not use drugs.   Family History:   family history includes Breast cancer in her daughter and sister; Heart disease in her father and mother.    Review of Systems: Review of Systems  Constitutional: Negative.   Respiratory: Negative.   Cardiovascular: Negative.   Gastrointestinal: Negative.   Musculoskeletal: Negative.        Gait instability  Neurological: Negative.   Psychiatric/Behavioral: Negative.   All other systems reviewed and are negative.    PHYSICAL EXAM: VS:  There were no vitals taken for this visit. , BMI There is no height or weight on file to calculate BMI.  Constitutional:  oriented to person, place, and time. No distress. Obese HENT:  Head: Normocephalic and atraumatic.  Eyes:  no discharge. No scleral icterus.  Neck: Normal range of motion. Neck supple. No JVD present.  Cardiovascular: Normal rate, regular rhythm, normal heart sounds and intact distal pulses. Exam reveals no gallop and no friction rub.  Trace nonpitting lower extremity edema No murmur heard. Pulmonary/Chest: Effort normal and breath sounds normal. No stridor. No respiratory distress.  no wheezes.  no rales.  no tenderness.  Abdominal: Soft.  no distension.  no tenderness.  Musculoskeletal: Normal range of motion.  no  tenderness or deformity.  Neurological:  normal muscle tone. Coordination normal. No atrophy Skin: Skin is warm and dry. No rash noted. not diaphoretic.  Psychiatric:  normal mood and affect. behavior is normal. Thought content normal.      Recent Labs: 07/09/2019: ALT 9; BUN 13; Creatinine, Ser 0.69; Potassium 4.3; Sodium 138; TSH 0.29    Lipid Panel Lab Results  Component Value Date   CHOL 205 (H) 07/09/2019   HDL 48.40  07/09/2019   LDLCALC 137 (H) 07/09/2019   TRIG 95.0 07/09/2019      Wt Readings from Last 3 Encounters:  07/12/19 242 lb 1 oz (109.8 kg)  07/09/19 230 lb (104.3 kg)  08/17/18 237 lb 8 oz (107.7 kg)       ASSESSMENT AND PLAN:   Paroxysmal atrial fibrillation ([place]) - Plan: EKG 12-Lead Long discussion with her, atrial fibrillation in the setting of hypotension 12 years ago 2007 . possibly from being on HCTZ, Warfarin previously held for falls and risk of injury Will remain vigilant concerning her rhythm Denies any significant symptoms  HLD (hyperlipidemia) Does not want a statin, most recent cholesterol around 200 Again discussed with her  Essential hypertension Blood pressure is well controlled on today's visit. No changes made to the medications. Stable  Obesity Unable to exercise secondary to leg weakness and fall risk Recommended low carbohydrate diet  Leg swelling Dependent edema No strong indication for diuretic at  this time    Total encounter time more than 25 minutes  Greater than 50% was spent in counseling and coordination of care with the patient   Disposition:   F/U  12 months   No orders of the defined types were placed in this encounter.    Signed, Esmond Plants, M.D., Ph.D. 10/14/2019  [place], [place]

## 2019-10-16 ENCOUNTER — Ambulatory Visit: Payer: PPO | Admitting: Cardiovascular Disease

## 2019-10-29 NOTE — Progress Notes (Signed)
Patient ID: Julie Phillips, female   DOB: 1932/05/07, 85 y.o.   MRN: [id number] Cardiology Office Note  Date:  10/31/2019   ID:  Julie Phillips, DOB [birth year], MRN [id number]  PCP:  Tonia Ghent, MD   Chief Complaint  Patient presents with  . other    12 month follow up. Meds reviewed by the pt. verbally. "doing well."     HPI:  Julie Phillips is a pleasant 85 year old woman with remote history of  atrial fibrillation in 2007,  not on anticoagulation (no recurrence in 13 yrs) In [place] in setting of hypokalemia (did stress test/pharmacological study) low potassium by her report,  previously on warfarin, stopped, was falling alot hypertension  who presents for routine followup of her atrial fibrillation.  Presents today in a wheelchair Rare arm pain after eating certain foods, 40 min later after eating bread, sx goes away  Sedentary at home Wobbly, uses a walker and cane  Lab work reviewed Total chol 205 Does not want a statin  Weight high  Denies any tachycardia or palpitations concerning for recurrent atrial fibrillation No significant leg swelling No falls   GERD better on omeprazole  EKG personally reviewed by myself on todays visit Shows NSR rate 66 bpm with no ST or T wave changes  Other past medical history She has tried statins in the past and had a problem with her "urine". Details are uncertain  Previous echocardiogram that showed mild MR and TR, normal LV systolic function  Stress test was normal showing no ischemia with normal ejection fraction . Carotid ultrasound showed minimal chronic plaquing  PMH:   has a past medical history of AF (atrial fibrillation) ([place]), Fall, GERD (gastroesophageal reflux disease), History of chicken pox, colonic polyps, Hyperlipidemia, Hypertension, Hypothyroidism, Keratopathy (2018), and LVH (left ventricular hypertrophy).  PSH:    Past Surgical History:  Procedure Laterality Date  . CARDIOVASCULAR STRESS  TEST  2011   normal   . keratopathy      Current Outpatient Medications  Medication Sig Dispense Refill  . aspirin EC 81 MG tablet Take 2 tablets (162 mg total) by mouth daily.    . Cholecalciferol (VITAMIN D) 2000 units tablet Take 1 tablet (2,000 Units total) by mouth daily.    . Cyanocobalamin (VITAMIN B-12 PO) Take 2,500 mg by mouth daily.    Marland Kitchen desoximetasone (TOPICORT) 0.25 % cream Apply 1 application topically 2 (two) times daily as needed. 30 g 1  . diphenhydrAMINE (BENADRYL) 25 mg capsule Take 1 capsule (25 mg total) by mouth daily as needed.    . gabapentin (NEURONTIN) 100 MG capsule Take 1 capsule (100 mg total) by mouth 3 (three) times daily as needed. 270 capsule 3  . levothyroxine (SYNTHROID) 112 MCG tablet Take 1 tablet (112 mcg total) by mouth daily before breakfast. 90 tablet 3  . loratadine (CLARITIN) 10 MG tablet Take 1 tablet (10 mg total) by mouth daily as needed.    Marland Kitchen losartan (COZAAR) 100 MG tablet TAKE 1 TABLET BY MOUTH ONCE DAILY 90 tablet 3  . Lutein-Zeaxanthin 15-0.7 MG CAPS Take by mouth. Take 20 mg in the PO and 45 mg in the AM    . metoprolol succinate (TOPROL-XL) 50 MG 24 hr tablet TAKE 1 TABLET BY MOUTH ONCE DAILY *TAKE WITH OR IMMEDIATELY FOLLOWINGA MEAL* 90 tablet 3  . Multiple Vitamins-Minerals (VISION FORMULA) TABS Take by mouth daily.    Marland Kitchen omeprazole (PRILOSEC) 20 MG capsule Take 1 capsule (20 mg  total) by mouth daily. 90 capsule 3  . Polyethyl Glycol-Propyl Glycol (SYSTANE) 0.4-0.3 % GEL ophthalmic gel Place 1 application into both eyes.    . potassium chloride (K-DUR) 10 MEQ tablet TAKE 1 TABLET BY MOUTH ONCE A DAY 90 tablet 0   No current facility-administered medications for this visit.      Allergies:   Statins, Wheat, Amlodipine, Eucalyptus oil, and Lisinopril   Social History:  The patient  reports that she quit smoking about 47 years ago. Her smoking use included cigarettes. She has a 40.00 pack-year smoking history. She has never used  smokeless tobacco. She reports previous alcohol use. She reports that she does not use drugs.   Family History:   family history includes Breast cancer in her daughter and sister; Heart disease in her father and mother.   Review of Systems  Constitutional: Negative.   HENT: Negative.   Respiratory: Negative.   Cardiovascular: Negative.        Left arm pain after eating bread  Gastrointestinal: Negative.   Musculoskeletal: Negative.        Leg weakness, poor balance  Neurological: Negative.   Psychiatric/Behavioral: Negative.   All other systems reviewed and are negative.    PHYSICAL EXAM: VS:  BP 140/70 (BP Location: Left Arm, Patient Position: Sitting, Cuff Size: Normal)   Pulse 66   Temp (!) 96.3 F (35.7 C)   Ht 5' (1.524 m)   Wt 240 lb (108.9 kg)   BMI 46.87 kg/m  , BMI Body mass index is 46.87 kg/m. Presents today in a wheelchair Constitutional:  oriented to person, place, and time. No distress.  HENT:  Head: Grossly normal Eyes:  no discharge. No scleral icterus.  Neck: No JVD, no carotid bruits  Cardiovascular: Regular rate and rhythm, no murmurs appreciated Pulmonary/Chest: Clear to auscultation bilaterally, no wheezes or rails Abdominal: Soft.  no distension.  no tenderness.  Musculoskeletal: Normal range of motion Neurological:  normal muscle tone. Coordination normal. No atrophy Skin: Skin warm and dry Psychiatric: normal affect, pleasant    Recent Labs: 07/09/2019: ALT 9; BUN 13; Creatinine, Ser 0.69; Potassium 4.3; Sodium 138; TSH 0.29    Lipid Panel Lab Results  Component Value Date   CHOL 205 (H) 07/09/2019   HDL 48.40 07/09/2019   LDLCALC 137 (H) 07/09/2019   TRIG 95.0 07/09/2019      Wt Readings from Last 3 Encounters:  10/31/19 240 lb (108.9 kg)  07/12/19 242 lb 1 oz (109.8 kg)  07/09/19 230 lb (104.3 kg)      ASSESSMENT AND PLAN:   Paroxysmal atrial fibrillation ([place]) - Plan: EKG 12-Lead Long discussion with her, atrial  fibrillation in the setting of hypotension 12 years ago 2007 . possibly from being on HCTZ, Warfarin previously held for falls and risk of injury -Denies any recurrent arrhythmia symptoms For unclear reasons with taking 2 aspirins a day, we recommended she at least decrease down to no more than 1 baby aspirin ,if not stop the aspirin  HLD (hyperlipidemia) Does not want a statin, most recent cholesterol around 200 Interested in zetia.  We have sent in a prescription  Essential hypertension Blood pressure is well controlled on today's visit. No changes made to the medications. Stable  Obesity We have encouraged careful diet management in an effort to lose weight.  She is unable to exercise  Leg swelling Dependent edema.  Minimal on today's exam stable    Total encounter time more than 25 minutes  Greater  than 50% was spent in counseling and coordination of care with the patient   Disposition:   F/U  12 months   Orders Placed This Encounter  Procedures  . EKG 12-Lead     Signed, Esmond Plants, M.D., Ph.D. 10/31/2019  [place], [place]

## 2019-10-31 ENCOUNTER — Other Ambulatory Visit: Payer: Self-pay

## 2019-10-31 ENCOUNTER — Encounter: Payer: Self-pay | Admitting: Cardiovascular Disease

## 2019-10-31 ENCOUNTER — Ambulatory Visit (INDEPENDENT_AMBULATORY_CARE_PROVIDER_SITE_OTHER): Payer: PPO | Admitting: Cardiovascular Disease

## 2019-10-31 VITALS — BP 140/70 | HR 66 | Temp 96.3°F | Ht 60.0 in | Wt 240.0 lb

## 2019-10-31 DIAGNOSIS — I480 Paroxysmal atrial fibrillation: Secondary | ICD-10-CM | POA: Diagnosis not present

## 2019-10-31 DIAGNOSIS — E782 Mixed hyperlipidemia: Secondary | ICD-10-CM

## 2019-10-31 DIAGNOSIS — R0602 Shortness of breath: Secondary | ICD-10-CM | POA: Diagnosis not present

## 2019-10-31 DIAGNOSIS — I10 Essential (primary) hypertension: Secondary | ICD-10-CM

## 2019-10-31 MED ORDER — EZETIMIBE 10 MG PO TABS: 10.0000 mg | ORAL_TABLET | Freq: Every day | ORAL | 3 refills | Status: DC

## 2019-10-31 MED ORDER — ASPIRIN EC 81 MG PO TBEC: 81.0000 mg | DELAYED_RELEASE_TABLET | Freq: Every day | ORAL | 3 refills | Status: DC

## 2019-10-31 NOTE — Patient Instructions (Addendum)
Medication Instructions:  Your physician has recommended you make the following change in your medication:  1. DECREASE Aspirin down to 81 mg once daily 2. START Ezetimibe 10 mg once daily  If you need a refill on your cardiac medications before your next appointment, please call your pharmacy.    Lab work: Lipid panel in 6 months. Please make sure not to eat or drink anything after midnight prior except small sip of water with pills.    If you have labs (blood work) drawn today and your tests are completely normal, you will receive your results only by: Marland Kitchen MyChart Message (if you have MyChart) OR . A paper copy in the mail If you have any lab test that is abnormal or we need to change your treatment, we will call you to review the results.   Testing/Procedures: No new testing needed   Follow-Up: At [facility], you and your health needs are our priority.  As part of our continuing mission to provide you with exceptional heart care, we have created designated Provider Care Teams.  These Care Teams include your primary Cardiologist (physician) and Advanced Practice Providers (APPs -  Physician Assistants and Nurse Practitioners) who all work together to provide you with the care you need, when you need it.  . You will need a follow up appointment in 12 months .   Please call our office 2 months in advance to schedule this appointment.    . Providers on your designated Care Team:   . Murray Hodgkins, NP . Christell Faith, PA-C . Marrianne Mood, PA-C  Any Other Special Instructions Will Be Listed Below (If Applicable).  For educational health videos Log in to : [email or web address] Or : [email or web address], password : [email or web address]

## 2019-10-31 NOTE — Progress Notes (Signed)
Patient wants to have fasting lipid panel done at her PCP office in 6 months.

## 2019-11-27 ENCOUNTER — Other Ambulatory Visit: Payer: Self-pay | Admitting: Family Medicine

## 2020-01-03 ENCOUNTER — Telehealth: Payer: Self-pay

## 2020-01-03 ENCOUNTER — Ambulatory Visit: Payer: PPO | Attending: Internal Medicine

## 2020-01-03 DIAGNOSIS — Z23 Encounter for immunization: Secondary | ICD-10-CM | POA: Insufficient documentation

## 2020-01-03 NOTE — Telephone Encounter (Signed)
Pt left v/m that she received the covid vaccine in [place] today(already on immunization list) and pt got updated flu shot in Nov 2020  At [facility]. Pt said she got the 60 + flu shot at [facility] on 11/08/20. Immunization updated.

## 2020-01-03 NOTE — Progress Notes (Signed)
   Covid-19 Vaccination Clinic  Name:  Julie Phillips    MRN: [id number] DOB: Aug 29, 1932  01/03/2020  [name] was observed post Covid-19 immunization for 15 minutes without incidence. She was provided with Vaccine Information Sheet and instruction to access the V-Safe system.   [name] was instructed to call 911 with any severe reactions post vaccine: Marland Kitchen Difficulty breathing  . Swelling of your face and throat  . A fast heartbeat  . A bad rash all over your body  . Dizziness and weakness    Immunizations Administered    Name Date Dose VIS Date Route   Pfizer COVID-19 Vaccine 01/03/2020  9:40 AM 0.3 mL 11/23/2019 Intramuscular   Manufacturer: [place]   Lot: [id number]   [place]: [id number]

## 2020-01-21 ENCOUNTER — Ambulatory Visit: Payer: PPO | Attending: Internal Medicine

## 2020-01-21 DIAGNOSIS — Z23 Encounter for immunization: Secondary | ICD-10-CM | POA: Insufficient documentation

## 2020-01-21 NOTE — Progress Notes (Signed)
   Covid-19 Vaccination Clinic  Name:  Julie Phillips    MRN: [id number] DOB: 08/21/1932  01/21/2020  [name] was observed post Covid-19 immunization for 15 minutes without incidence. She was provided with Vaccine Information Sheet and instruction to access the V-Safe system.   [name] was instructed to call 911 with any severe reactions post vaccine: Marland Kitchen Difficulty breathing  . Swelling of your face and throat  . A fast heartbeat  . A bad rash all over your body  . Dizziness and weakness

## 2020-03-25 ENCOUNTER — Ambulatory Visit
Admission: RE | Admit: 2020-03-25 | Discharge: 2020-03-25 | Disposition: A | Discharge status: Home / Self Care | Payer: PPO | Source: Ambulatory Visit | Attending: Emergency Medicine | Admitting: Emergency Medicine

## 2020-03-25 ENCOUNTER — Telehealth: Payer: Self-pay | Admitting: *Deleted

## 2020-03-25 ENCOUNTER — Ambulatory Visit
Admission: EM | Admit: 2020-03-25 | Discharge: 2020-03-25 | Disposition: A | Discharge status: Home / Self Care | Payer: PPO | Attending: Emergency Medicine | Admitting: Emergency Medicine

## 2020-03-25 ENCOUNTER — Encounter: Payer: Self-pay | Admitting: Emergency Medicine

## 2020-03-25 ENCOUNTER — Other Ambulatory Visit: Payer: Self-pay

## 2020-03-25 DIAGNOSIS — R0602 Shortness of breath: Secondary | ICD-10-CM

## 2020-03-25 DIAGNOSIS — R5383 Other fatigue: Secondary | ICD-10-CM | POA: Diagnosis not present

## 2020-03-25 LAB — POC SARS CORONAVIRUS 2 AG -  ED: SARS Coronavirus 2 Ag: NEGATIVE

## 2020-03-25 NOTE — ED Triage Notes (Signed)
Patient in UC for sob and fatigue sx started 4d ago Patient states that this happens periodically but this issue is lasting a little longer. Has taken loratadine 1 hour ago.  OTC: vicks vapor rub  Denies:tightness in chest ,fever,

## 2020-03-25 NOTE — Discharge Instructions (Addendum)
Go right away to [facility] for your chest x-ray.    Your rapid COVID test was negative; the send out test is pending.  We will call you if it is positive.    Your lab work is pending; I will call you with the results tomorrow.    Your EKG looked fine.    Call your primary care provider's office tomorrow morning to schedule a follow-up visit.

## 2020-03-25 NOTE — ED Provider Notes (Signed)
Roderic Palau    CSN: [id number] Arrival date & time: 03/25/20  1539      History   Chief Complaint Chief Complaint  Patient presents with  . Shortness of Breath  . Fatigue    HPI DORINDA AYOUB is a 86 y.o. female.   Patient presents with a 4-day history of fatigue and shortness of breath.  She also reports having chest pain 3 days ago but this resolved and has not returned.  She denies focal weakness, fever, chills, congestion, sore throat, cough, vomiting, diarrhea, rash, or other symptoms.  She attempted treatment at home with Vicks vapor rub with minimal relief.  The history is provided by the patient.    Past Medical History:  Diagnosis Date  . AF (atrial fibrillation) ([place])   . Fall   . GERD (gastroesophageal reflux disease)   . History of chicken pox   . Hx of colonic polyps   . Hyperlipidemia   . Hypertension   . Hypothyroidism   . Keratopathy 2018   R eye  . LVH (left ventricular hypertrophy)     Patient Active Problem List   Diagnosis Date Noted  . GERD (gastroesophageal reflux disease) 07/18/2019  . Hyperglycemia 07/09/2018  . Vitamin D deficiency 07/09/2018  . Sciatica of left side 11/13/2017  . Umbilical abnormality [id number]  . Healthcare maintenance 06/28/2017  . Toenail avulsion 06/28/2017  . DNR (do not resuscitate) 06/27/2017  . Band keratopathy 01/16/2017  . Risk for falls 06/21/2016  . Rotator cuff disorder 12/10/2015  . SOB (shortness of breath) 12/10/2015  . Osteopenia 02/13/2015  . Advance care planning 01/15/2015  . Aspiration into airway 10/08/2013  . Food allergy 07/09/2013  . Medicare annual wellness visit, subsequent 12/26/2012  . Knee pain 07/20/2012  . Rash 04/11/2012  . Hypothyroid 04/11/2012  . LVH (left ventricular hypertrophy) 01/17/2012  . AF (atrial fibrillation) ([place]) 01/06/2012  . Obesity 01/06/2012  . Essential hypertension 01/06/2012  . Mixed hyperlipidemia 01/06/2012    Past Surgical History:    Procedure Laterality Date  . CARDIOVASCULAR STRESS TEST  2011   normal   . keratopathy      OB History   No obstetric history on file.      Home Medications    Prior to Admission medications   Medication Sig Start Date End Date Taking? Authorizing Provider  aspirin EC 81 MG tablet Take 1 tablet (81 mg total) by mouth daily. 10/31/19   Minna Merritts, MD  Cholecalciferol (VITAMIN D) 2000 units tablet Take 1 tablet (2,000 Units total) by mouth daily. 07/06/18   Tonia Ghent, MD  Cyanocobalamin (VITAMIN B-12 PO) Take 2,500 mg by mouth daily.    [provider]  desoximetasone (TOPICORT) 0.25 % cream Apply 1 application topically 2 (two) times daily as needed. 07/06/18   Tonia Ghent, MD  diphenhydrAMINE (BENADRYL) 25 mg capsule Take 1 capsule (25 mg total) by mouth daily as needed. 01/14/17   Tonia Ghent, MD  ezetimibe (ZETIA) 10 MG tablet Take 1 tablet (10 mg total) by mouth daily. 10/31/19 01/29/20  Minna Merritts, MD  gabapentin (NEURONTIN) 100 MG capsule Take 1 capsule (100 mg total) by mouth 3 (three) times daily as needed. 07/12/19   Tonia Ghent, MD  levothyroxine (SYNTHROID) 112 MCG tablet Take 1 tablet (112 mcg total) by mouth daily before breakfast. 07/12/19   Tonia Ghent, MD  loratadine (CLARITIN) 10 MG tablet Take 1 tablet (10 mg total) by  mouth daily as needed. 01/14/17   Tonia Ghent, MD  losartan (COZAAR) 100 MG tablet TAKE 1 TABLET BY MOUTH ONCE DAILY 07/09/19   Tonia Ghent, MD  Lutein-Zeaxanthin 15-0.7 MG CAPS Take by mouth. Take 20 mg in the PO and 45 mg in the AM    [provider]  metoprolol succinate (TOPROL-XL) 50 MG 24 hr tablet TAKE 1 TABLET BY MOUTH ONCE DAILY *TAKE WITH OR IMMEDIATELY FOLLOWINGA MEAL* 08/16/19   Tonia Ghent, MD  Multiple Vitamins-Minerals (VISION FORMULA) TABS Take by mouth daily.    [provider]  omeprazole (PRILOSEC) 20 MG capsule Take 1 capsule (20 mg total) by mouth daily. 07/12/19    Tonia Ghent, MD  Polyethyl Glycol-Propyl Glycol (SYSTANE) 0.4-0.3 % GEL ophthalmic gel Place 1 application into both eyes.    [provider]  potassium chloride (KLOR-CON) 10 MEQ tablet TAKE 1 TABLET BY MOUTH ONCE A DAY 11/28/19   Tonia Ghent, MD    Family History Family History  Problem Relation Age of Onset  . Heart disease Mother   . Heart disease Father   . Breast cancer Sister   . Breast cancer Daughter   . Colon cancer Neg Hx     Social History Social History   Tobacco Use  . Smoking status: Former Smoker    Packs/day: 2.00    Years: 20.00    Pack years: 40.00    Types: Cigarettes    Quit date: 12/14/1971    Years since quitting: 48.3  . Smokeless tobacco: Never Used  Substance Use Topics  . Alcohol use: Not Currently  . Drug use: No     Allergies   Statins, Wheat, Amlodipine, Eucalyptus oil, and Lisinopril   Review of Systems Review of Systems  Constitutional: Positive for fatigue. Negative for chills and fever.  HENT: Negative for congestion, ear pain, rhinorrhea and sore throat.   Eyes: Negative for pain and visual disturbance.  Respiratory: Positive for shortness of breath. Negative for cough.   Cardiovascular: Negative for chest pain and palpitations.  Gastrointestinal: Negative for abdominal pain, diarrhea, nausea and vomiting.  Genitourinary: Negative for dysuria and hematuria.  Musculoskeletal: Negative for arthralgias and back pain.  Skin: Negative for color change and rash.  Neurological: Negative for dizziness, seizures, syncope, facial asymmetry, speech difficulty, weakness and numbness.  All other systems reviewed and are negative.    Physical Exam Triage Vital Signs ED Triage Vitals  Enc Vitals Group     BP      Pulse      Resp      Temp      Temp src      SpO2      Weight      Height      Head Circumference      Peak Flow      Pain Score      Pain Loc      Pain Edu?      Excl. in [place]?    No data  found.  Updated Vital Signs BP (!) 146/81 (BP Location: Right Arm)   Pulse 69   Temp 98.8 F (37.1 C) (Oral)   Resp 18   Ht 5\' 2"  (1.575 m)   Wt 240 lb (108.9 kg)   SpO2 96%   BMI 43.90 kg/m   Visual Acuity Right Eye Distance:   Left Eye Distance:   Bilateral Distance:    Right Eye Near:   Left Eye  Near:    Bilateral Near:     Physical Exam Vitals and nursing note reviewed.  Constitutional:      General: She is not in acute distress.    Appearance: She is well-developed. She is obese. She is not ill-appearing.  HENT:     Head: Normocephalic and atraumatic.     Mouth/Throat:     Mouth: Mucous membranes are moist.  Eyes:     Conjunctiva/sclera: Conjunctivae normal.  Cardiovascular:     Rate and Rhythm: Normal rate and regular rhythm.     Heart sounds: Normal heart sounds. No murmur.  Pulmonary:     Effort: Pulmonary effort is normal. No respiratory distress.     Breath sounds: Normal breath sounds. No wheezing or rhonchi.  Abdominal:     General: Bowel sounds are normal.     Palpations: Abdomen is soft.     Tenderness: There is no abdominal tenderness. There is no guarding or rebound.  Musculoskeletal:     Cervical back: Neck supple.     Right lower leg: No edema.     Left lower leg: No edema.  Skin:    General: Skin is warm and dry.     Findings: No rash.  Neurological:     General: No focal deficit present.     Mental Status: She is alert and oriented to person, place, and time.     Comments: Ambulatory with walker.   Psychiatric:        Mood and Affect: Mood normal.        Behavior: Behavior normal.      UC Treatments / Results  Labs (all labs ordered are listed, but only abnormal results are displayed) Labs Reviewed  COMPREHENSIVE METABOLIC PANEL - Abnormal; Notable for the following components:      Result Value   Alkaline Phosphatase 123 (*)    All other components within normal limits   Narrative:    Performed at:  [address], [place], [place]  [id number] Lab Director: Rush Farmer MD, Phone:  [phone]  NOVEL CORONAVIRUS, NAA  CBC   Narrative:    Performed at:  [address] [place], [place]  [id number] Lab Director: Rush Farmer MD, Phone:  [phone]  CBC  POC SARS CORONAVIRUS 2 AG -  ED    EKG   Radiology DG Chest 2 View  Result Date: 03/25/2020 CLINICAL DATA:  Shortness of breath for several days EXAM: CHEST - 2 VIEW COMPARISON:  09/11/2013 FINDINGS: Cardiac shadows within normal limits. The lungs are well aerated bilaterally. Aortic calcifications are again seen. No focal infiltrate or sizable effusion is noted. Degenerative changes of the thoracic spine seen. IMPRESSION: No acute abnormality noted. Electronically Signed   By: Inez Catalina M.D.   On: 03/25/2020 17:33    Procedures Procedures (including critical care time)  Medications Ordered in UC Medications - No data to display  Initial Impression / Assessment and Plan / UC Course  I have reviewed the triage vital signs and the nursing notes.  Pertinent labs & imaging results that were available during my care of the patient were reviewed by me and considered in my medical decision making (see chart for details).   Fatigue, shortness of breath.  EKG shows normal sinus rhythm, rate 67, no ST elevation, compared to previous from November 2020.  POC COVID negative; PCR pending.  CXR negative.  CBC, CMP unremarkable; results discussed with patient.  Instructed patient  to call her PCP for a follow-up appointment tomorrow.  Patient agrees to plan of care.   Final Clinical Impressions(s) / UC Diagnoses   Final diagnoses:  Fatigue, unspecified type  Shortness of breath     Discharge Instructions     Go right away to [facility] for your chest x-ray.    Your rapid COVID test was negative; the send out test is pending.  We will call you if it is positive.     Your lab work is pending; I will call you with the results tomorrow.    Your EKG looked fine.    Call your primary care provider's office tomorrow morning to schedule a follow-up visit.        ED Prescriptions    None     PDMP not reviewed this encounter.   Sharion Balloon, NP 03/26/20 [phone]

## 2020-03-25 NOTE — Telephone Encounter (Signed)
Patient called stating that she has been having difficulty taking a deep breathe and has had a problem like this before.  Patient requested that Dr. Damita Dunnings call in something to the pharmacy for this. Patient stated that she ate something Friday night around 8:00 pm and shortly after that she had chest pain and left arm pain. Patient stated that the chest pain went away Saturday morning. Patient stated that she has felt real tired and sleeping a lot. Patient stated that she has been sneezing and has a little chest congestion. Patient stated that she has not had a fever. Patient stated that the symptoms started about a week or so ago, but has gotten worse the last couple of days.  After speaking with Dr. Damita Dunnings patient was advised that she needs to be evaluated for her symptoms today. Patient stated that she does not want to go to the ER, but did agree to go to [facility]. No appointments here at the office today. Patient was given ER and 911 precautions and she verbalized understanding. Patient stated that she will have someone take her to [facility] within the next hour.

## 2020-03-26 LAB — COMPREHENSIVE METABOLIC PANEL
ALT: 8 IU/L (ref 0–32)
AST: 17 IU/L (ref 0–40)
Albumin/Globulin Ratio: 1.3 (ref 1.2–2.2)
Albumin: 3.9 g/dL (ref 3.6–4.6)
Alkaline Phosphatase: 123 IU/L — ABNORMAL HIGH (ref 39–117)
BUN/Creatinine Ratio: 14 (ref 12–28)
BUN: 10 mg/dL (ref 8–27)
Bilirubin Total: 0.3 mg/dL (ref 0.0–1.2)
CO2: 22 mmol/L (ref 20–29)
Calcium: 9.4 mg/dL (ref 8.7–10.3)
Chloride: 105 mmol/L (ref 96–106)
Creatinine, Ser: 0.71 mg/dL (ref 0.57–1.00)
GFR calc Af Amer: 90 mL/min/{1.73_m2} (ref >59)
GFR calc non Af Amer: 78 mL/min/{1.73_m2} (ref >59)
Globulin, Total: 2.9 g/dL (ref 1.5–4.5)
Glucose: 86 mg/dL (ref 65–99)
Potassium: 4.2 mmol/L (ref 3.5–5.2)
Sodium: 140 mmol/L (ref 134–144)
Total Protein: 6.8 g/dL (ref 6.0–8.5)

## 2020-03-26 LAB — CBC
Hematocrit: 37.4 % (ref 34.0–46.6)
Hemoglobin: 12.5 g/dL (ref 11.1–15.9)
MCH: 28.9 pg (ref 26.6–33.0)
MCHC: 33.4 g/dL (ref 31.5–35.7)
MCV: 87 fL (ref 79–97)
Platelets: 312 10*3/uL (ref 150–450)
RBC: 4.32 x10E6/uL (ref 3.77–5.28)
RDW: 13.4 % (ref 11.7–15.4)
WBC: 9.8 10*3/uL (ref 3.4–10.8)

## 2020-03-26 NOTE — Telephone Encounter (Signed)
Noted. Thanks.

## 2020-03-26 NOTE — Telephone Encounter (Signed)
LVM

## 2020-03-26 NOTE — Telephone Encounter (Signed)
It looks like her labs and EKG were unremarkable.  I agree with everything that they did.  If she is feeling better with loratadine in the midst of the recent pollen flare then I suspect that was the causative issue.  If she does not continue to feel better then offer a follow-up here in the clinic.  Thanks.

## 2020-03-26 NOTE — Telephone Encounter (Signed)
Pt left v/m that pt did go to [facility] [address] and pt request cb after Dr Damita Dunnings reviews all of pt testing that was done at [facility] on [address].(lab results, EKG and CXR). I spoke with pt and pt is feeling better today; pt said her problems could be related to all the "stuff in the air."  pt found bottle of Loratadine in a drawer and started Loratadine last night and has taken another Loratadine this morning. Pt is not having CP,SOB,H/A,dizziness or fever. Pt request cb after Dr Damita Dunnings reviews this note and testing done at [facility] [address]. [facility].

## 2020-03-26 NOTE — Telephone Encounter (Signed)
Pt called back and has stayed inside today with air conditioner on and pt thinks she is doing better. Pt does not want to schedule appt at this time and pt will cb and schedule appt if needed. FYI to Dr Damita Dunnings.

## 2020-03-27 LAB — SARS-COV-2, NAA 2 DAY TAT

## 2020-03-27 LAB — NOVEL CORONAVIRUS, NAA: SARS-CoV-2, NAA: NOT DETECTED

## 2020-06-12 ENCOUNTER — Other Ambulatory Visit: Payer: Self-pay

## 2020-06-12 NOTE — Telephone Encounter (Signed)
Dr. Damita Dunnings, Patient is visiting family/friends in [place], [place] and her flight back home has been delayed a couple of weeks. She states she will run out of medication prior to returning home, and she is needing a temporary supply of her medications sent to [facility] in [place], [place]. I have pended the medications that she is needing. Please advise.

## 2020-06-13 MED ORDER — LOSARTAN POTASSIUM 100 MG PO TABS: 100.0000 mg | ORAL_TABLET | Freq: Every day | ORAL | 0 refills | Status: DC

## 2020-06-13 MED ORDER — METOPROLOL SUCCINATE ER 50 MG PO TB24: ORAL_TABLET | ORAL | 0 refills | Status: DC

## 2020-06-13 MED ORDER — POTASSIUM CHLORIDE ER 10 MEQ PO TBCR: 10.0000 meq | EXTENDED_RELEASE_TABLET | Freq: Every day | ORAL | 0 refills | Status: DC

## 2020-06-13 MED ORDER — EZETIMIBE 10 MG PO TABS: 10.0000 mg | ORAL_TABLET | Freq: Every day | ORAL | 0 refills | Status: DC

## 2020-06-13 MED ORDER — LEVOTHYROXINE SODIUM 112 MCG PO TABS: 112.0000 ug | ORAL_TABLET | Freq: Every day | ORAL | 0 refills | Status: DC

## 2020-06-13 NOTE — Telephone Encounter (Signed)
Patient advised.

## 2020-06-13 NOTE — Telephone Encounter (Signed)
Sent. Thanks.  Please notify pt.

## 2020-08-04 ENCOUNTER — Other Ambulatory Visit: Payer: Self-pay

## 2020-08-04 ENCOUNTER — Encounter: Payer: Self-pay | Admitting: Family Medicine

## 2020-08-04 ENCOUNTER — Ambulatory Visit (INDEPENDENT_AMBULATORY_CARE_PROVIDER_SITE_OTHER): Payer: PPO | Admitting: Family Medicine

## 2020-08-04 VITALS — BP 142/82 | HR 86 | Temp 96.3°F | Ht 62.0 in | Wt 240.5 lb

## 2020-08-04 DIAGNOSIS — E039 Hypothyroidism, unspecified: Secondary | ICD-10-CM

## 2020-08-04 DIAGNOSIS — E782 Mixed hyperlipidemia: Secondary | ICD-10-CM

## 2020-08-04 DIAGNOSIS — K219 Gastro-esophageal reflux disease without esophagitis: Secondary | ICD-10-CM | POA: Diagnosis not present

## 2020-08-04 DIAGNOSIS — E559 Vitamin D deficiency, unspecified: Secondary | ICD-10-CM | POA: Diagnosis not present

## 2020-08-04 DIAGNOSIS — I480 Paroxysmal atrial fibrillation: Secondary | ICD-10-CM | POA: Diagnosis not present

## 2020-08-04 LAB — COMPREHENSIVE METABOLIC PANEL
ALT: 7 U/L (ref 0–35)
AST: 12 U/L (ref 0–37)
Albumin: 3.9 g/dL (ref 3.5–5.2)
Alkaline Phosphatase: 101 U/L (ref 39–117)
BUN: 9 mg/dL (ref 6–23)
CO2: 27 mEq/L (ref 19–32)
Calcium: 9.4 mg/dL (ref 8.4–10.5)
Chloride: 104 mEq/L (ref 96–112)
Creatinine, Ser: 0.61 mg/dL (ref 0.40–1.20)
GFR: 93.04 mL/min (ref >60.00)
Glucose, Bld: 90 mg/dL (ref 70–99)
Potassium: 4.3 mEq/L (ref 3.5–5.1)
Sodium: 138 mEq/L (ref 135–145)
Total Bilirubin: 0.5 mg/dL (ref 0.2–1.2)
Total Protein: 7.1 g/dL (ref 6.0–8.3)

## 2020-08-04 LAB — LIPID PANEL
Cholesterol: 197 mg/dL (ref 0–200)
HDL: 46.8 mg/dL (ref >39.00)
LDL Cholesterol: 133 mg/dL — ABNORMAL HIGH (ref 0–99)
NonHDL: 149.85
Total CHOL/HDL Ratio: 4
Triglycerides: 84 mg/dL (ref 0.0–149.0)
VLDL: 16.8 mg/dL (ref 0.0–40.0)

## 2020-08-04 LAB — TSH: TSH: 0.87 u[IU]/mL (ref 0.35–4.50)

## 2020-08-04 LAB — VITAMIN D 25 HYDROXY (VIT D DEFICIENCY, FRACTURES): VITD: 31.42 ng/mL (ref 30.00–100.00)

## 2020-08-04 MED ORDER — OMEPRAZOLE 20 MG PO CPDR: 40.0000 mg | DELAYED_RELEASE_CAPSULE | Freq: Every day | ORAL | Status: DC

## 2020-08-04 NOTE — Patient Instructions (Signed)
Get the head of your bed elevated about 2-3 inches.   Take 2 prilosec at night.   If you keep having trouble then let me know.    Take care.  Glad to see you.

## 2020-08-04 NOTE — Progress Notes (Signed)
This visit occurred during the SARS-CoV-2 public health emergency.  Safety protocols were in place, including screening questions prior to the visit, additional usage of staff PPE, and extensive cleaning of exam room while observing appropriate contact time as indicated for disinfecting solutions.  GERD clearly better with PPI.  She was hesitant to stop med at this point since she was clearly better.  Discussed.  No adverse effect on medication.  She had an episode of likely laryngospasm at night when she was in [place].  She woke up with dyspnea and anxiety.  She was able to calm herself down and didn't need to go to ER.  She was still on PPI at that point.  Her voice was squeaky/altered at the time.  It has happened about 6 times this year.  She is taking prilosec at night.  She had gone up to 2 prilosec at night after episode in [place], now back down to 1 tab at night.  No episodes in the day, only at night after being asleep.  Swallowing well.    She was asking about getting a heart rate monitor.  She has h/o AF.  Letter done for patient.  Elevated Cholesterol: Using medications without problems:yes Muscle aches: no Diet compliance: encouraged.  Exercise: limited.  See notes on labs.  Meds, vitals, and allergies reviewed.   ROS: Per HPI unless specifically indicated in ROS section   GEN: nad, alert and oriented HEENT: mucous membranes moist, OP wnl, normal voice.  NECK: supple w/o LA, no stridor CV: rrr. PULM: ctab, no inc wob ABD: soft, +bs EXT: no edema SKIN: no acute rash

## 2020-08-10 NOTE — Assessment & Plan Note (Signed)
Statin intolerant.  Continue Zetia.  She agrees.

## 2020-08-10 NOTE — Assessment & Plan Note (Signed)
She was asking about getting a heart rate monitor.  She has h/o AF.  Letter done for patient.

## 2020-08-10 NOTE — Assessment & Plan Note (Signed)
She clearly did better on PPI but then she likely had breakthrough episodes of nocturnal laryngospasm that could have been GERD related and we talked about increasing her PPI to 2 tablets a day.  Discussed elevating the head of her bed.  She will update me as needed.  No symptoms now.

## 2020-09-27 ENCOUNTER — Other Ambulatory Visit: Payer: Self-pay | Admitting: Family Medicine

## 2020-10-08 ENCOUNTER — Ambulatory Visit: Payer: PPO | Admitting: Cardiovascular Disease

## 2020-10-20 ENCOUNTER — Other Ambulatory Visit: Payer: Self-pay | Admitting: Family Medicine

## 2020-10-22 ENCOUNTER — Other Ambulatory Visit: Payer: Self-pay | Admitting: Family Medicine

## 2020-10-22 NOTE — Telephone Encounter (Signed)
Pharmacy requests refill on: metoprolol succinate 50 mg 24 hr  LAST REFILL: 06/12/20 LAST OV: 08/04/2020 NEXT OV: Not Scheduled PHARMACY: [place]

## 2020-10-27 ENCOUNTER — Telehealth: Payer: Self-pay

## 2020-10-27 NOTE — Telephone Encounter (Signed)
error 

## 2020-10-27 NOTE — Progress Notes (Signed)
Office Visit    Patient Name: Julie Phillips Date of Encounter: 10/28/2020  Primary Care Provider:  Tonia Ghent, MD Primary Cardiologist:  Ida Rogue, MD Electrophysiologist:  None   Chief Complaint    Julie Phillips is a 86 y.o. female with a hx of PAF in 2007 not on anticoagulation with no recurrence, HTN, HLD, hypothyroidism, obesity presents today for follow-up of PAF  Past Medical History    Past Medical History:  Diagnosis Date  . AF (atrial fibrillation) ([place])   . Fall   . GERD (gastroesophageal reflux disease)   . History of chicken pox   . Hx of colonic polyps   . Hyperlipidemia   . Hypertension   . Hypothyroidism   . Keratopathy 2018   R eye  . LVH (left ventricular hypertrophy)    Past Surgical History:  Procedure Laterality Date  . CARDIOVASCULAR STRESS TEST  2011   normal   . keratopathy      Allergies  Allergies  Allergen Reactions  . Statins     Myalgias  . Wheat     Throat swelling  . Amlodipine     hematuria  . Eucalyptus Oil   . Lisinopril     Unrecalled BP med- possibly lisinopril- intolerant, had cough on the medicine- changed to cozaar    History of Present Illness    Julie Phillips is a 86 y.o. female with a hx of remote PAF in 2007 not on anticoagulation with no recurrence, HTN, HLD, hypothyroidism, obesity last seen by Dr. Rockey Situ 10/31/2019.  She was diagnosed with paroxysmal atrial fibrillation in [place] in the setting of hypokalemia in 2007. Echo at that time with mild MR/TR, normal LVEF. Stress test at that time with no evidence of ischemia. Carotid duplex at the time with minimal chronic plaquing.  Previously on Warfarin though this was discontinued due to frequent falls. She has not had recurrence of her atrial fibrillation.   She presents today for follow-up.  Since last seen she has enjoyed a 1 month trip to [place] to see her daughter as well as a beach trip.  She reports no chest pain, pressure, tightness.  She  reports no shortness of breath at rest nor dyspnea on exertion.  She is overall hesitant regarding medications including Zetia and as her LDL came down from 137-133 on the Zetia she tells me she is "self discontinued" but she does not wish to take this medication.  We discussed lipid-lowering diet.  She wonders whether she needs to be back on warfarin.  We discussed that if she has not had recurrent palpitations or PAF no indication for warfarin at this time.  She is taking a baby aspirin daily and encouraged to continue.  She tells me she has not fallen in the last year and a half and her mobility has improved with physical therapy.  We discussed that there was no indication for warfarin if she has not had recurrent episode of paroxysmal atrial fibrillation.  She reports intermittent pedal edema and has notable varicose veins on exam.  We discussed conservative measures such as compression stockings, keeping her legs elevated.   EKGs/Labs/Other Studies Reviewed:   The following studies were reviewed today:  EKG:  EKG is ordered today.  The ekg ordered today demonstrates NSR 67 bpm with poor R wave progression and no acute ST/T wave changes.   Recent Labs: 03/25/2020: Hemoglobin 12.5; Platelets 312 08/04/2020: ALT 7; BUN 9; Creatinine, Ser 0.61;  Potassium 4.3; Sodium 138; TSH 0.87  Recent Lipid Panel    Component Value Date/Time   CHOL 197 08/04/2020 1147   TRIG 84.0 08/04/2020 1147   HDL 46.80 08/04/2020 1147   CHOLHDL 4 08/04/2020 1147   VLDL 16.8 08/04/2020 1147   LDLCALC 133 (H) 08/04/2020 1147   LDLDIRECT 152.6 12/31/2013 1004    Home Medications   Current Meds  Medication Sig  . aspirin EC 81 MG tablet Take 1 tablet (81 mg total) by mouth daily.  . Cholecalciferol (VITAMIN D) 2000 units tablet Take 1 tablet (2,000 Units total) by mouth daily.  . Cyanocobalamin (VITAMIN B-12 PO) Take 2,500 mg by mouth daily.  . diphenhydrAMINE (BENADRYL) 25 mg capsule Take 1 capsule (25 mg  total) by mouth daily as needed.  . gabapentin (NEURONTIN) 100 MG capsule Take 1 capsule (100 mg total) by mouth 3 (three) times daily as needed.  Marland Kitchen levothyroxine (SYNTHROID) 112 MCG tablet TAKE 1 TABLET BY MOUTH ONCE A DAY BEFOREBREAKFAST.  Marland Kitchen loratadine (CLARITIN) 10 MG tablet Take 1 tablet (10 mg total) by mouth daily as needed.  Marland Kitchen losartan (COZAAR) 100 MG tablet TAKE 1 TABLET BY MOUTH ONCE DAILY  . Lutein-Zeaxanthin 15-0.7 MG CAPS Take by mouth. Take 20 mg in the PO and 45 mg in the AM  . metoprolol succinate (TOPROL-XL) 50 MG 24 hr tablet TAKE 1 TABLET BY MOUTH ONCE A DAY. TAKE WITH OR IMMEDIATELY FOLLOWING A MEAL  . Multiple Vitamins-Minerals (VISION FORMULA) TABS Take by mouth daily.  Marland Kitchen omeprazole (PRILOSEC) 20 MG capsule TAKE 1 CAPSULE BY MOUTH ONCE DAILY  . Polyethyl Glycol-Propyl Glycol (SYSTANE) 0.4-0.3 % GEL ophthalmic gel Place 1 application into both eyes.  . potassium chloride (KLOR-CON) 10 MEQ tablet Take 1 tablet (10 mEq total) by mouth daily.     Review of Systems  All other systems reviewed and are otherwise negative except as noted above.  Physical Exam    VS:  BP 140/70 (BP Location: Left Arm, Patient Position: Sitting, Cuff Size: Large)   Pulse 67   Ht 5\' 1"  (1.549 m)   Wt 237 lb 12.8 oz (107.9 kg)   SpO2 98%   BMI 44.93 kg/m  , BMI Body mass index is 44.93 kg/m.  Wt Readings from Last 3 Encounters:  10/28/20 237 lb 12.8 oz (107.9 kg)  08/04/20 240 lb 8 oz (109.1 kg)  03/25/20 240 lb (108.9 kg)    GEN: Well nourished, overweight, well developed, in no acute distress. HEENT: normal. Neck: Supple, no JVD, carotid bruits, or masses. Cardiac: RRR, no murmurs, rubs, or gallops. No clubbing, cyanosis, edema.  Radials/DP/PT 2+ and equal bilaterally.  Respiratory:  Respirations regular and unlabored, clear to auscultation bilaterally. GI: Soft, nontender, nondistended. MS: No deformity or atrophy. Skin: Warm and dry, no rash. Neuro:  Strength and sensation are  intact. Psych: Normal affect.  Assessment & Plan    1. PAF- No evidence of recurrent atrial flutter.  EKG today maintaining NSR.  Denies palpitations.  Anticoagulation previously discontinued due to frequent falls.  We will not resume at this time.  2. HLD- Declines statins due to previous intolerance with myalgias. 08/04/20 lipid panel with LDL 133.  She was previously on Zetia though tells me she will "self discontinued "today as she is not interested in continuing.  She had no noted side effects but does not feel it is making a marked improvement in her lipid management.  We discussed lipid-lowering diet.  3. HTN- BP  mildly elevated in clinic today 140/70 however in setting of history of frequent falls BP goal of less than 140/90 is reasonable. She will continue Losartan 100mg  daily and Tprol 50mg  daily.  Continue to follow with PCP.  4. Obesity- Weight loss via diet and exercise encouraged.  5. Hypothyroidism-continue to follow with PCP.  Disposition: Follow up in 1 year(s) with Dr. Rockey Situ or APP  Signed, Loel Dubonnet, NP 10/28/2020, 11:09 AM [place]

## 2020-10-28 ENCOUNTER — Other Ambulatory Visit: Payer: Self-pay

## 2020-10-28 ENCOUNTER — Encounter: Payer: Self-pay | Admitting: Family

## 2020-10-28 ENCOUNTER — Ambulatory Visit: Payer: PPO | Admitting: Family

## 2020-10-28 VITALS — BP 140/70 | HR 67 | Ht 61.0 in | Wt 237.8 lb

## 2020-10-28 DIAGNOSIS — I480 Paroxysmal atrial fibrillation: Secondary | ICD-10-CM | POA: Diagnosis not present

## 2020-10-28 DIAGNOSIS — E782 Mixed hyperlipidemia: Secondary | ICD-10-CM | POA: Diagnosis not present

## 2020-10-28 DIAGNOSIS — I10 Essential (primary) hypertension: Secondary | ICD-10-CM | POA: Diagnosis not present

## 2020-10-28 DIAGNOSIS — Z6841 Body Mass Index (BMI) 40.0 and over, adult: Secondary | ICD-10-CM

## 2020-10-28 DIAGNOSIS — T466X5A Adverse effect of antihyperlipidemic and antiarteriosclerotic drugs, initial encounter: Secondary | ICD-10-CM | POA: Diagnosis not present

## 2020-10-28 DIAGNOSIS — M791 Myalgia, unspecified site: Secondary | ICD-10-CM | POA: Diagnosis not present

## 2020-10-28 NOTE — Patient Instructions (Addendum)
Medication Instructions:  No medication changes today.   *If you need a refill on your cardiac medications before your next appointment, please call your pharmacy*  Lab Work: No lab work today.   Testing/Procedures: Your EKG today shows normal sinus rhythm.   Follow-Up: At [facility], you and your health needs are our priority.  As part of our continuing mission to provide you with exceptional heart care, we have created designated Provider Care Teams.  These Care Teams include your primary Cardiologist (physician) and Advanced Practice Providers (APPs -  Physician Assistants and Nurse Practitioners) who all work together to provide you with the care you need, when you need it.  We recommend signing up for the patient portal called "MyChart".  Sign up information is provided on this After Visit Summary.  MyChart is used to connect with patients for Virtual Visits (Telemedicine).  Patients are able to view lab/test results, encounter notes, upcoming appointments, etc.  Non-urgent messages can be sent to your provider as well.   To learn more about what you can do with MyChart, go to [email or web address].    Your next appointment:   1 year(s)  The format for your next appointment:   In Person  Provider:   You may see Ida Rogue, MD or one of the following Advanced Practice Providers on your designated Care Team:    Murray Hodgkins, NP  Christell Faith, PA-C  Marrianne Mood, PA-C  Cadence Palen[place], [place]  Laurann Montana, NP  Other Instructions  For your swelling in your legs, we recommend  Elevating your legs when sitting  Continuing a low salt diet  Wearing compression stockings or diabetic socks  We have removed the Zetia from your medication list today. We recommend a lipid lowering diet. Additional information is listed below.   Fat and Cholesterol Restricted Eating Plan Getting too much fat and cholesterol in your diet may cause health problems. Choosing the  right foods helps keep your fat and cholesterol at normal levels. This can keep you from getting certain diseases. What are tips for following this plan? Meal planning  At meals, divide your plate into four equal parts: ? Fill one-half of your plate with vegetables and green salads. ? Fill one-fourth of your plate with whole grains. ? Fill one-fourth of your plate with low-fat (lean) protein foods.  Eat fish that is high in omega-3 fats at least two times a week. This includes mackerel, tuna, sardines, and salmon.  Eat foods that are high in fiber, such as whole grains, beans, apples, broccoli, carrots, peas, and barley. General tips   Work with your doctor to lose weight if you need to.  Avoid: ? Foods with added sugar. ? Fried foods. ? Foods with partially hydrogenated oils.  Limit alcohol intake to no more than 1 drink a day for nonpregnant women and 2 drinks a day for men. One drink equals 12 oz of beer, 5 oz of wine, or 1 oz of hard liquor. Reading food labels  Check food labels for: ? Trans fats. ? Partially hydrogenated oils. ? Saturated fat (g) in each serving. ? Cholesterol (mg) in each serving. ? Fiber (g) in each serving.  Choose foods with healthy fats, such as: ? Monounsaturated fats. ? Polyunsaturated fats. ? Omega-3 fats.  Choose grain products that have whole grains. Look for the word "whole" as the first word in the ingredient list. Cooking  Cook foods using low-fat methods. These include baking, boiling, grilling, and broiling.  Eat more  home-cooked foods. Eat at restaurants and buffets less often.  Avoid cooking using saturated fats, such as butter, cream, palm oil, palm kernel oil, and coconut oil. Recommended foods  Fruits  All fresh, canned (in natural juice), or frozen fruits. Vegetables  Fresh or frozen vegetables (raw, steamed, roasted, or grilled). Green salads. Grains  Whole grains, such as whole wheat or whole grain breads, crackers,  cereals, and pasta. Unsweetened oatmeal, bulgur, barley, quinoa, or brown rice. Corn or whole wheat flour tortillas. Meats and other protein foods  Ground beef (85% or leaner), grass-fed beef, or beef trimmed of fat. Skinless chicken or [place]. Ground chicken or [place]. Pork trimmed of fat. All fish and seafood. Egg whites. Dried beans, peas, or lentils. Unsalted nuts or seeds. Unsalted canned beans. Nut butters without added sugar or oil. Dairy  Low-fat or nonfat dairy products, such as skim or 1% milk, 2% or reduced-fat cheeses, low-fat and fat-free ricotta or cottage cheese, or plain low-fat and nonfat yogurt. Fats and oils  Tub margarine without trans fats. Light or reduced-fat mayonnaise and salad dressings. Avocado. Olive, canola, sesame, or safflower oils. The items listed above may not be a complete list of foods and beverages you can eat. Contact a dietitian for more information. Foods to avoid Fruits  Canned fruit in heavy syrup. Fruit in cream or butter sauce. Fried fruit. Vegetables  Vegetables cooked in cheese, cream, or butter sauce. Fried vegetables. Grains  White bread. White pasta. White rice. Cornbread. Bagels, pastries, and croissants. Crackers and snack foods that contain trans fat and hydrogenated oils. Meats and other protein foods  Fatty cuts of meat. Ribs, chicken wings, bacon, sausage, bologna, salami, chitterlings, fatback, hot dogs, bratwurst, and packaged lunch meats. Liver and organ meats. Whole eggs and egg yolks. Chicken and [place] with skin. Fried meat. Dairy  Whole or 2% milk, cream, half-and-half, and cream cheese. Whole milk cheeses. Whole-fat or sweetened yogurt. Full-fat cheeses. Nondairy creamers and whipped toppings. Processed cheese, cheese spreads, and cheese curds. Beverages  Alcohol. Sugar-sweetened drinks such as sodas, lemonade, and fruit drinks. Fats and oils  Butter, stick margarine, lard, shortening, ghee, or bacon fat. Coconut, palm  kernel, and palm oils. Sweets and desserts  Corn syrup, sugars, honey, and molasses. Candy. Jam and jelly. Syrup. Sweetened cereals. Cookies, pies, cakes, donuts, muffins, and ice cream. The items listed above may not be a complete list of foods and beverages you should avoid. Contact a dietitian for more information. Summary  Choosing the right foods helps keep your fat and cholesterol at normal levels. This can keep you from getting certain diseases.  At meals, fill one-half of your plate with vegetables and green salads.  Eat high-fiber foods, like whole grains, beans, apples, carrots, peas, and barley.  Limit added sugar, saturated fats, alcohol, and fried foods. This information is not intended to replace advice given to you by your health care provider. Make sure you discuss any questions you have with your health care provider. Document Revised: 08/02/2018 Document Reviewed: 08/16/2017 Elsevier Patient Education  [place].

## 2021-01-15 ENCOUNTER — Other Ambulatory Visit: Payer: Self-pay | Admitting: Family Medicine

## 2021-01-29 ENCOUNTER — Other Ambulatory Visit: Payer: Self-pay | Admitting: Family Medicine

## 2021-01-29 NOTE — Telephone Encounter (Signed)
Refill request for Gabapentin 100 mg capsules   LOV - 08/04/20 Next OV - not scheduled Last refilled - 07/12/19 #270/3

## 2021-01-29 NOTE — Telephone Encounter (Signed)
Sent. Thanks.   

## 2021-03-23 ENCOUNTER — Other Ambulatory Visit: Payer: Self-pay | Admitting: Family Medicine

## 2021-04-02 DIAGNOSIS — Z961 Presence of intraocular lens: Secondary | ICD-10-CM | POA: Diagnosis not present

## 2021-04-02 DIAGNOSIS — H524 Presbyopia: Secondary | ICD-10-CM | POA: Diagnosis not present

## 2021-04-02 DIAGNOSIS — H5213 Myopia, bilateral: Secondary | ICD-10-CM | POA: Diagnosis not present

## 2021-04-02 DIAGNOSIS — H52203 Unspecified astigmatism, bilateral: Secondary | ICD-10-CM | POA: Diagnosis not present

## 2021-04-02 DIAGNOSIS — H1789 Other corneal scars and opacities: Secondary | ICD-10-CM | POA: Diagnosis not present

## 2021-04-30 ENCOUNTER — Other Ambulatory Visit: Payer: Self-pay | Admitting: Family Medicine

## 2021-05-04 ENCOUNTER — Telehealth: Payer: Self-pay

## 2021-05-04 NOTE — Telephone Encounter (Signed)
Pt wants to add her 2 covid pfizer boosters; immunization record updated at pts request with the one covid booster was done. Pt also got 2nd covid booster but does not know the date. Pt said she would get that info and cb to finish updating her covid immunzation list. Sending note to [facility] CMA.

## 2021-05-05 NOTE — Telephone Encounter (Signed)
Noted. Thanks for updating chart.

## 2021-07-10 ENCOUNTER — Other Ambulatory Visit: Payer: Self-pay

## 2021-07-10 ENCOUNTER — Encounter: Payer: Self-pay | Admitting: Family Medicine

## 2021-07-10 ENCOUNTER — Ambulatory Visit (INDEPENDENT_AMBULATORY_CARE_PROVIDER_SITE_OTHER): Payer: PPO | Admitting: Family Medicine

## 2021-07-10 VITALS — BP 118/62 | HR 88 | Temp 97.8°F | Ht 61.0 in | Wt 232.0 lb

## 2021-07-10 DIAGNOSIS — E039 Hypothyroidism, unspecified: Secondary | ICD-10-CM

## 2021-07-10 DIAGNOSIS — K219 Gastro-esophageal reflux disease without esophagitis: Secondary | ICD-10-CM

## 2021-07-10 DIAGNOSIS — E782 Mixed hyperlipidemia: Secondary | ICD-10-CM

## 2021-07-10 DIAGNOSIS — I10 Essential (primary) hypertension: Secondary | ICD-10-CM

## 2021-07-10 DIAGNOSIS — I480 Paroxysmal atrial fibrillation: Secondary | ICD-10-CM | POA: Diagnosis not present

## 2021-07-10 DIAGNOSIS — Z0000 Encounter for general adult medical examination without abnormal findings: Secondary | ICD-10-CM

## 2021-07-10 DIAGNOSIS — E559 Vitamin D deficiency, unspecified: Secondary | ICD-10-CM

## 2021-07-10 DIAGNOSIS — Z7189 Other specified counseling: Secondary | ICD-10-CM

## 2021-07-10 DIAGNOSIS — M5432 Sciatica, left side: Secondary | ICD-10-CM | POA: Diagnosis not present

## 2021-07-10 DIAGNOSIS — R251 Tremor, unspecified: Secondary | ICD-10-CM

## 2021-07-10 MED ORDER — OMEPRAZOLE 20 MG PO CPDR: 20.0000 mg | DELAYED_RELEASE_CAPSULE | Freq: Every day | ORAL | 3 refills | Status: DC

## 2021-07-10 NOTE — Progress Notes (Signed)
This visit occurred during the SARS-CoV-2 public health emergency.  Safety protocols were in place, including screening questions prior to the visit, additional usage of staff PPE, and extensive cleaning of exam room while observing appropriate contact time as indicated for disinfecting solutions.  Hypertension:    Using medication without problems or lightheadedness: yes Chest pain with exertion:no Edema: no Short of breath: not unless triggered with food allergies, cautions d/w pt.    On gabapentin prn for sciatica.  No ADE on med.  Used episodically.    She has occ cramping in the R hand.    GERD controlled with PPI, as long as not eating after 7PM.  D/w pt.    Hypothyroidism.  No neck mass, no dysphagia.  No fatigue.  She has mild B hand tremor with hand in extension, not at rest.    Advance directive- daughter Su Grand if patient were incapacitated. DNR reaffirmed 07/10/21  Mammogram declined.  D/w pt.  I'll defer to patient. DEXA declined.  I'll defer to patient.  Vaccines d/w pt.  Labs pending.    Her daughter has memory changes.  D/w pt.    Meds, vitals, and allergies reviewed.   ROS: Per HPI unless specifically indicated in ROS section   GEN: nad, alert and oriented HEENT: ncat NECK: supple w/o LA CV: rrr PULM: ctab, no inc wob ABD: soft, +bs EXT: no edema SKIN: no acute rash She has mild B hand tremor with hand in extension, not at rest.

## 2021-07-10 NOTE — Patient Instructions (Addendum)
Go to the lab on the way out.   If you have mychart we'll likely use that to update you.     Ask the front to update Julie Phillips to your DPR.   Check with your insurance to see if they will cover the shingrix and tetanus shots.   Take care.  Glad to see you.

## 2021-07-11 LAB — COMPREHENSIVE METABOLIC PANEL
AG Ratio: 1.2 (calc) (ref 1.0–2.5)
ALT: 8 U/L (ref 6–29)
AST: 13 U/L (ref 10–35)
Albumin: 3.6 g/dL (ref 3.6–5.1)
Alkaline phosphatase (APISO): 105 U/L (ref 37–153)
BUN: 11 mg/dL (ref 7–25)
CO2: 25 mmol/L (ref 20–32)
Calcium: 9.3 mg/dL (ref 8.6–10.4)
Chloride: 105 mmol/L (ref 98–110)
Creat: 0.73 mg/dL (ref 0.60–0.95)
Globulin: 3.1 g/dL (calc) (ref 1.9–3.7)
Glucose, Bld: 123 mg/dL — ABNORMAL HIGH (ref 65–99)
Potassium: 4.2 mmol/L (ref 3.5–5.3)
Sodium: 138 mmol/L (ref 135–146)
Total Bilirubin: 0.4 mg/dL (ref 0.2–1.2)
Total Protein: 6.7 g/dL (ref 6.1–8.1)

## 2021-07-11 LAB — CBC WITH DIFFERENTIAL/PLATELET
Absolute Monocytes: 713 cells/uL (ref 200–950)
Basophils Absolute: 99 cells/uL (ref 0–200)
Basophils Relative: 1 %
Eosinophils Absolute: 436 cells/uL (ref 15–500)
Eosinophils Relative: 4.4 %
HCT: 37.1 % (ref 35.0–45.0)
Hemoglobin: 12.2 g/dL (ref 11.7–15.5)
Lymphs Abs: 2109 cells/uL (ref 850–3900)
MCH: 28.9 pg (ref 27.0–33.0)
MCHC: 32.9 g/dL (ref 32.0–36.0)
MCV: 87.9 fL (ref 80.0–100.0)
MPV: 10.3 fL (ref 7.5–12.5)
Monocytes Relative: 7.2 %
Neutro Abs: 6544 cells/uL (ref 1500–7800)
Neutrophils Relative %: 66.1 %
Platelets: 272 10*3/uL (ref 140–400)
RBC: 4.22 10*6/uL (ref 3.80–5.10)
RDW: 13.8 % (ref 11.0–15.0)
Total Lymphocyte: 21.3 %
WBC: 9.9 10*3/uL (ref 3.8–10.8)

## 2021-07-11 LAB — LIPID PANEL
Cholesterol: 199 mg/dL (ref <200)
HDL: 49 mg/dL — ABNORMAL LOW (ref >=50)
LDL Cholesterol (Calc): 127 mg/dL (calc) — ABNORMAL HIGH
Non-HDL Cholesterol (Calc): 150 mg/dL (calc) — ABNORMAL HIGH (ref <130)
Total CHOL/HDL Ratio: 4.1 (calc) (ref <5.0)
Triglycerides: 124 mg/dL (ref <150)

## 2021-07-11 LAB — VITAMIN D 25 HYDROXY (VIT D DEFICIENCY, FRACTURES): Vit D, 25-Hydroxy: 35 ng/mL (ref 30–100)

## 2021-07-11 LAB — TSH: TSH: 0.32 mIU/L — ABNORMAL LOW (ref 0.40–4.50)

## 2021-07-12 ENCOUNTER — Other Ambulatory Visit: Payer: Self-pay | Admitting: Family Medicine

## 2021-07-12 DIAGNOSIS — E039 Hypothyroidism, unspecified: Secondary | ICD-10-CM

## 2021-07-12 DIAGNOSIS — R251 Tremor, unspecified: Secondary | ICD-10-CM | POA: Insufficient documentation

## 2021-07-12 MED ORDER — LOSARTAN POTASSIUM 100 MG PO TABS: 100.0000 mg | ORAL_TABLET | Freq: Every day | ORAL | 3 refills | Status: DC

## 2021-07-12 MED ORDER — LEVOTHYROXINE SODIUM 100 MCG PO TABS: ORAL_TABLET | ORAL | 3 refills | Status: DC

## 2021-07-12 MED ORDER — METOPROLOL SUCCINATE ER 50 MG PO TB24: ORAL_TABLET | ORAL | 3 refills | Status: DC

## 2021-07-12 MED ORDER — POTASSIUM CHLORIDE ER 10 MEQ PO TBCR: 10.0000 meq | EXTENDED_RELEASE_TABLET | Freq: Every day | ORAL | 3 refills | Status: DC

## 2021-07-12 NOTE — Assessment & Plan Note (Signed)
Advance directive- daughter Janet designated if patient were incapacitated. 

## 2021-07-12 NOTE — Assessment & Plan Note (Signed)
Controlled with omeprazole and avoiding eating late at night.  She will update me as needed.

## 2021-07-12 NOTE — Assessment & Plan Note (Signed)
She will continue to be as active as possible and will use gabapentin as needed.  Update me as needed.

## 2021-07-12 NOTE — Assessment & Plan Note (Signed)
Compliant.  Continue levothyroxine.  See notes on labs.

## 2021-07-12 NOTE — Assessment & Plan Note (Addendum)
She has mild B hand tremor with hand in extension, not at rest.   Likely a benign intention tremor.  TSH pending.  This does not have parkinsonian features.  Discussed.  Assuming TSH is normal, would observe for now.  She agrees.

## 2021-07-12 NOTE — Assessment & Plan Note (Signed)
Advance directive- daughter Julie Phillips if patient were incapacitated. DNR reaffirmed 07/10/21  Mammogram declined.  D/w pt.  I'll defer to patient. DEXA declined.  I'll defer to patient.  Vaccines d/w pt.  Labs pending.

## 2021-07-12 NOTE — Assessment & Plan Note (Signed)
Controlled.  Continue losartan and metoprolol.  See notes on labs.

## 2021-09-14 ENCOUNTER — Other Ambulatory Visit: Payer: Self-pay

## 2021-09-14 ENCOUNTER — Other Ambulatory Visit (INDEPENDENT_AMBULATORY_CARE_PROVIDER_SITE_OTHER): Payer: PPO

## 2021-09-14 ENCOUNTER — Other Ambulatory Visit: Payer: PPO

## 2021-09-14 DIAGNOSIS — E039 Hypothyroidism, unspecified: Secondary | ICD-10-CM | POA: Diagnosis not present

## 2021-09-14 LAB — TSH: TSH: 1.26 u[IU]/mL (ref 0.35–5.50)

## 2021-11-01 NOTE — Progress Notes (Signed)
Patient ID: Julie Phillips, female   DOB: [birth year], 87 y.o.   MRN: [id number] Cardiology Office Note  Date:  11/02/2021   ID:  Julie Phillips, DOB 01/01/1932, MRN [id number]  PCP:  Tonia Ghent, MD   Chief Complaint  Patient presents with   12 month follow up     Patient c/o right leg swelling more than the left with swelling in ankles. Medications reviewed by the patient verbally.     HPI:  Julie Phillips  is a pleasant 87 year-old woman with remote history of  atrial fibrillation in 2007,  not on anticoagulation (no recurrence in 13 yrs) In [place] in setting of hypokalemia (did stress test/pharmacological study) low potassium by her report,  previously on warfarin, stopped, was falling alot hypertension  who presents for routine followup of her atrial fibrillation.  Presents today with a walker  Leg swelling, mild right>left  Still drives, No regular exercise  Lab work reviewed Total chol 205 Does not want a statin  No falls   GERD , on omeprazole  EKG personally reviewed by myself on todays visit Shows NSR rate 69 bpm with no ST or T wave changes  Other past medical history She has tried statins in the past and had a problem with her "urine". Details are uncertain  Previous echocardiogram that showed mild MR and TR, normal LV systolic function   Stress test was normal showing no ischemia with normal ejection fraction . Carotid ultrasound showed minimal chronic plaquing  PMH:   has a past medical history of AF (atrial fibrillation) ([place]), Fall, GERD (gastroesophageal reflux disease), History of chicken pox, colonic polyps, Hyperlipidemia, Hypertension, Hypothyroidism, Keratopathy (2018), and LVH (left ventricular hypertrophy).  PSH:    Past Surgical History:  Procedure Laterality Date   CARDIOVASCULAR STRESS TEST  2011   normal    keratopathy      Current Outpatient Medications  Medication Sig Dispense Refill   aspirin EC 81 MG tablet Take 1  tablet (81 mg total) by mouth daily. 90 tablet 3   Cyanocobalamin (VITAMIN B-12 PO) Take 2,500 mg by mouth daily.     diphenhydrAMINE (BENADRYL) 25 mg capsule Take 1 capsule (25 mg total) by mouth daily as needed.     gabapentin (NEURONTIN) 100 MG capsule TAKE 1 CAPSULE BY MOUTH 3 TIMES DAILY ASNEEDED 270 capsule 3   levothyroxine (SYNTHROID) 100 MCG tablet TAKE 1 TABLET BY MOUTH ONCE A DAY BEFOREBREAKFAST. 90 tablet 3   loratadine (CLARITIN) 10 MG tablet Take 1 tablet (10 mg total) by mouth daily as needed.     losartan (COZAAR) 100 MG tablet Take 1 tablet (100 mg total) by mouth daily. 90 tablet 3   Lutein-Zeaxanthin 15-0.7 MG CAPS Take by mouth. Take 20 mg in the PO and 45 mg in the AM     metoprolol succinate (TOPROL-XL) 50 MG 24 hr tablet Take with or immediately following a meal. 90 tablet 3   Multiple Vitamins-Minerals (VISION FORMULA) TABS Take by mouth daily.     omeprazole (PRILOSEC) 20 MG capsule Take 1 capsule (20 mg total) by mouth daily. 90 capsule 3   Polyethyl Glycol-Propyl Glycol (SYSTANE) 0.4-0.3 % GEL ophthalmic gel Place 1 application into both eyes.     potassium chloride (KLOR-CON) 10 MEQ tablet Take 1 tablet (10 mEq total) by mouth daily. 90 tablet 3   Cholecalciferol (VITAMIN D) 2000 units tablet Take 1 tablet (2,000 Units total) by mouth daily. (Patient not taking: Reported  on 11/02/2021)     No current facility-administered medications for this visit.     Allergies:   Statins, Wheat, Amlodipine, Eucalyptus oil, and Lisinopril   Social History:  The patient  reports that she quit smoking about 49 years ago. Her smoking use included cigarettes. She has a 40.00 pack-year smoking history. She has never used smokeless tobacco. She reports that she does not currently use alcohol. She reports that she does not use drugs.   Family History:   family history includes Breast cancer in her daughter and sister; Heart disease in her father and mother.   Review of Systems   Constitutional: Negative.   HENT: Negative.    Respiratory: Negative.    Cardiovascular: Negative.   Gastrointestinal: Negative.   Musculoskeletal: Negative.        Leg weakness, poor balance  Neurological: Negative.   Psychiatric/Behavioral: Negative.    All other systems reviewed and are negative.   PHYSICAL EXAM: VS:  BP (!) 150/78 (BP Location: Left Arm, Patient Position: Sitting, Cuff Size: Normal)   Pulse 69   Ht 5\' 1"  (1.549 m)   Wt 233 lb 6 oz (105.9 kg)   SpO2 98%   BMI 44.10 kg/m  , BMI Body mass index is 44.1 kg/m. Constitutional:  oriented to person, place, and time. No distress.  HENT:  Head: Grossly normal Eyes:  no discharge. No scleral icterus.  Neck: No JVD, no carotid bruits  Cardiovascular: Regular rate and rhythm, no murmurs appreciated Pulmonary/Chest: Clear to auscultation bilaterally, no wheezes or rails Abdominal: Soft.  no distension.  no tenderness.  Musculoskeletal: Normal range of motion Neurological:  normal muscle tone. Coordination normal. No atrophy Skin: Skin warm and dry Psychiatric: normal affect, pleasant   Recent Labs: 07/10/2021: ALT 8; BUN 11; Creat 0.73; Hemoglobin 12.2; Platelets 272; Potassium 4.2; Sodium 138 09/14/2021: TSH 1.26    Lipid Panel Lab Results  Component Value Date   CHOL 199 07/10/2021   HDL 49 (L) 07/10/2021   LDLCALC 127 (H) 07/10/2021   TRIG 124 07/10/2021      Wt Readings from Last 3 Encounters:  11/02/21 233 lb 6 oz (105.9 kg)  07/10/21 232 lb (105.2 kg)  10/28/20 237 lb 12.8 oz (107.9 kg)     ASSESSMENT AND PLAN:   Paroxysmal atrial fibrillation ([place]) - Plan: EKG 12-Lead atrial fibrillation in the setting of hypotension 12 years ago 2007 .  possibly from being on HCTZ,  Warfarin previously held for falls and risk of injury No recent falls No recurrent arrhythmia  HLD (hyperlipidemia) Does not want a statin, most recent cholesterol around 200  Essential hypertension Elevated today from  rushing Monitor at home  Obesity We have encouraged continued exercise, careful diet management in an effort to lose weight.  Leg swelling Dependent edema.  Minimal , stable Teds recommended  GERD Better on omeprazole   Total encounter time more than 25 minutes  Greater than 50% was spent in counseling and coordination of care with the patient     Orders Placed This Encounter  Procedures   EKG 12-Lead     Signed, Esmond Plants, M.D., Ph.D. 11/02/2021  [place], [place]

## 2021-11-02 ENCOUNTER — Encounter: Payer: Self-pay | Admitting: Cardiovascular Disease

## 2021-11-02 ENCOUNTER — Ambulatory Visit (INDEPENDENT_AMBULATORY_CARE_PROVIDER_SITE_OTHER): Payer: PPO | Admitting: Cardiovascular Disease

## 2021-11-02 ENCOUNTER — Other Ambulatory Visit: Payer: Self-pay

## 2021-11-02 VITALS — BP 150/78 | HR 69 | Ht 61.0 in | Wt 233.4 lb

## 2021-11-02 DIAGNOSIS — M791 Myalgia, unspecified site: Secondary | ICD-10-CM | POA: Diagnosis not present

## 2021-11-02 DIAGNOSIS — T466X5D Adverse effect of antihyperlipidemic and antiarteriosclerotic drugs, subsequent encounter: Secondary | ICD-10-CM | POA: Diagnosis not present

## 2021-11-02 DIAGNOSIS — E782 Mixed hyperlipidemia: Secondary | ICD-10-CM | POA: Diagnosis not present

## 2021-11-02 DIAGNOSIS — E66813 Obesity, class 3: Secondary | ICD-10-CM

## 2021-11-02 DIAGNOSIS — I480 Paroxysmal atrial fibrillation: Secondary | ICD-10-CM | POA: Diagnosis not present

## 2021-11-02 DIAGNOSIS — Z6841 Body Mass Index (BMI) 40.0 and over, adult: Secondary | ICD-10-CM | POA: Diagnosis not present

## 2021-11-02 DIAGNOSIS — I10 Essential (primary) hypertension: Secondary | ICD-10-CM

## 2021-11-02 DIAGNOSIS — T466X5A Adverse effect of antihyperlipidemic and antiarteriosclerotic drugs, initial encounter: Secondary | ICD-10-CM

## 2021-11-02 NOTE — Patient Instructions (Addendum)
Medication Instructions:  No changes  If you need a refill on your cardiac medications before your next appointment, please call your pharmacy.   Lab work: No new labs needed  Testing/Procedures: No new testing needed  Follow-Up: At [facility], you and your health needs are our priority.  As part of our continuing mission to provide you with exceptional heart care, we have created designated Provider Care Teams.  These Care Teams include your primary Cardiologist (physician) and Advanced Practice Providers (APPs -  Physician Assistants and Nurse Practitioners) who all work together to provide you with the care you need, when you need it.  You will need a follow up appointment in 12 months  Providers on your designated Care Team:   Christopher Berge, NP Ryan Dunn, PA-C Cadence Furth, PA-C  COVID-19 Vaccine Information can be found at: [email or web address] For questions related to vaccine distribution or appointments, please email [email or web address] or call [phone].   

## 2021-11-17 NOTE — Progress Notes (Deleted)
Subjective:   Julie Phillips is a 87 y.o. female who presents for Medicare Annual (Subsequent) preventive examination.  I connected with Alver Sorrow today by telephone and verified that I am speaking with the correct person using two identifiers. Location patient: home Location provider: work Persons participating in the virtual visit: patient, Marine scientist.    I discussed the limitations, risks, security and privacy concerns of performing an evaluation and management service by telephone and the availability of in person appointments. I also discussed with the patient that there may be a patient responsible charge related to this service. The patient expressed understanding and verbally consented to this telephonic visit.    Interactive audio and video telecommunications were attempted between this provider and patient, however failed, due to patient having technical difficulties OR patient did not have access to video capability.  We continued and completed visit with audio only.  Some vital signs may be absent or patient reported.   Time Spent with patient on telephone encounter: *** minutes  Review of Systems           Objective:    There were no vitals filed for this visit. There is no height or weight on file to calculate BMI.  Advanced Directives 07/09/2019 06/30/2018 06/22/2017 06/17/2016  Does Patient Have a Medical Advance Directive? Yes Yes Yes Yes  Type of Paramedic of [place];Living will [place];Living will [place];Living will [place];Living will  Does patient want to make changes to medical advance directive? - - - No - Patient declined  Copy of [place] in Chart? Yes - validated most recent copy scanned in chart (See row information) Yes Yes -    Current Medications (verified) Outpatient Encounter Medications as of 11/19/2021  Medication Sig   aspirin EC 81 MG tablet  Take 1 tablet (81 mg total) by mouth daily.   Cyanocobalamin (VITAMIN B-12 PO) Take 2,500 mg by mouth daily.   diphenhydrAMINE (BENADRYL) 25 mg capsule Take 1 capsule (25 mg total) by mouth daily as needed.   gabapentin (NEURONTIN) 100 MG capsule TAKE 1 CAPSULE BY MOUTH 3 TIMES DAILY ASNEEDED   levothyroxine (SYNTHROID) 100 MCG tablet TAKE 1 TABLET BY MOUTH ONCE A DAY BEFOREBREAKFAST.   loratadine (CLARITIN) 10 MG tablet Take 1 tablet (10 mg total) by mouth daily as needed.   losartan (COZAAR) 100 MG tablet Take 1 tablet (100 mg total) by mouth daily.   Lutein-Zeaxanthin 15-0.7 MG CAPS Take by mouth. Take 20 mg in the PO and 45 mg in the AM   metoprolol succinate (TOPROL-XL) 50 MG 24 hr tablet Take with or immediately following a meal.   Multiple Vitamins-Minerals (VISION FORMULA) TABS Take by mouth daily.   omeprazole (PRILOSEC) 20 MG capsule Take 1 capsule (20 mg total) by mouth daily.   Polyethyl Glycol-Propyl Glycol (SYSTANE) 0.4-0.3 % GEL ophthalmic gel Place 1 application into both eyes.   potassium chloride (KLOR-CON) 10 MEQ tablet Take 1 tablet (10 mEq total) by mouth daily.   No facility-administered encounter medications on file as of 11/19/2021.    Allergies (verified) Statins, Wheat, Amlodipine, Eucalyptus oil, and Lisinopril   History: Past Medical History:  Diagnosis Date   AF (atrial fibrillation) (HCC)    Fall    GERD (gastroesophageal reflux disease)    History of chicken pox    Hx of colonic polyps    Hyperlipidemia    Hypertension    Hypothyroidism  Keratopathy 2018   R eye   LVH (left ventricular hypertrophy)    Past Surgical History:  Procedure Laterality Date   CARDIOVASCULAR STRESS TEST  2011   normal    keratopathy     Family History  Problem Relation Age of Onset   Heart disease Mother    Heart disease Father    Breast cancer Sister    Breast cancer Daughter    Colon cancer Neg Hx    Social History   Socioeconomic History   Marital status:  Widowed    Spouse name: Not on file   Number of children: Not on file   Years of education: Not on file   Highest education level: Not on file  Occupational History   Occupation: Retired Armed forces technical officer: RETIRED  Tobacco Use   Smoking status: Former    Packs/day: 2.00    Years: 20.00    Pack years: 40.00    Types: Cigarettes    Quit date: 12/14/1971    Years since quitting: 49.9   Smokeless tobacco: Never  Vaping Use   Vaping Use: Never used  Substance and Sexual Activity   Alcohol use: Not Currently   Drug use: No   Sexual activity: Not Currently  Other Topics Concern   Not on file  Social History Narrative   Divorced   Education:  [employer]   8 pregnancies, 6 live births   LMP: 1985   Lives in independent living.     Social Determinants of Health   Financial Resource Strain: Not on file  Food Insecurity: Not on file  Transportation Needs: Not on file  Physical Activity: Not on file  Stress: Not on file  Social Connections: Not on file    Tobacco Counseling Counseling given: Not Answered   Clinical Intake:                 Diabetic? No         Activities of Daily Living No flowsheet data found.  Patient Care Team: Tonia Ghent, MD as PCP - General (Family Medicine) Rockey Situ Kathlene November, MD as PCP - Cardiology (Cardiology) Minna Merritts, MD as Consulting Physician (Cardiology) Doyce Para, MD as Consulting Physician (Ophthalmology) Dingeldein, Remo Lipps, MD as Consulting Physician (Ophthalmology)  Indicate any recent Medical Services you may have received from other than [facility] providers in the past year (date may be approximate).     Assessment:   This is a routine wellness examination for Julie Phillips.  Hearing/Vision screen No results found.  Dietary issues and exercise activities discussed:     Goals Addressed   None    Depression Screen PHQ 2/9 Scores 07/09/2019 06/30/2018 06/22/2017 06/17/2016 01/14/2015 01/03/2014  12/26/2012  PHQ - 2 Score 0 0 1 0 0 0 0  PHQ- 9 Score 0 0 - - - - -    Fall Risk Fall Risk  07/10/2021 07/12/2019 07/09/2019 06/30/2018 06/22/2017  Falls in the past year? 0 0 1 Yes Yes  Comment - - - 2 falls due to tripping over feet -  Number falls in past yr: 0 0 0 2 or more 2 or more  Comment - - tripped over own foot - -  Injury with Fall? 0 0 0 Yes No  Risk Factor Category  - - - High Fall Risk -  Risk for fall due to : - - History of fall(s);Medication side effect Impaired balance/gait;Impaired mobility History of fall(s)  Follow up Falls evaluation  completed - Falls evaluation completed;Falls prevention discussed - Falls prevention discussed  Comment - - - - Patient moved into a new apartment and has not had any fall since moving in December 2017.    FALL RISK PREVENTION PERTAINING TO THE HOME:  Any stairs in or around the home? {YES/NO:21197} If so, are there any without handrails? {YES/NO:21197} Home free of loose throw rugs in walkways, pet beds, electrical cords, etc? {YES/NO:21197} Adequate lighting in your home to reduce risk of falls? {YES/NO:21197}  ASSISTIVE DEVICES UTILIZED TO PREVENT FALLS:  Life alert? {YES/NO:21197} Use of a cane, walker or w/c? {YES/NO:21197} Grab bars in the bathroom? {YES/NO:21197} Shower chair or bench in shower? {YES/NO:21197} Elevated toilet seat or a handicapped toilet? {YES/NO:21197}  TIMED UP AND GO:  Was the test performed? No , visit completed over the phone   Cognitive Function: MMSE - Mini Mental State Exam 07/09/2019 06/30/2018 06/22/2017 06/17/2016  Orientation to time 5 5 5 5   Orientation to Place 5 5 5 5   Registration 3 3 3 3   Attention/ Calculation 5 0 0 0  Recall 3 2 3 3   Recall-comments - unable to recall 1 of 3 words - -  Language- name 2 objects 0 0 0 0  Language- repeat 1 1 1 1   Language- follow 3 step command 0 3 3 3   Language- read & follow direction 0 0 0 0  Write a sentence 0 0 0 0  Copy design 0 0 0 0  Total  score 22 19 20 20         Immunizations Immunization History  Administered Date(s) Administered   Fluad Quad(high Dose 65+) 09/11/2019, 11/09/2019   Influenza Split 10/04/2012   Influenza,inj,Quad PF,6+ Mos 10/08/2013, 09/25/2015, 09/14/2016, 10/06/2017   Influenza-Unspecified 09/13/2011, 08/13/2014, 10/13/2018   PFIZER(Purple Top)SARS-COV-2 Vaccination 01/03/2020, 01/21/2020, 09/11/2020, 04/18/2021   Pneumococcal Conjugate-13 08/13/2014   Pneumococcal Polysaccharide-23 12/13/2010   Td 12/13/2010    {TDAP status:2101805}  {Flu Vaccine status:2101806}  Pneumococcal vaccine status: Up to date  {Covid-19 vaccine status:2101808}  Qualifies for Shingles Vaccine? Yes   Zostavax completed {YES/NO:21197}  {Shingrix Completed?:2101804}  Screening Tests Health Maintenance  Topic Date Due   Zoster Vaccines- Shingrix (1 of 2) Never done   TETANUS/TDAP  12/13/2020   COVID-19 Vaccine (5 - Booster for Pfizer series) 06/13/2021   INFLUENZA VACCINE  07/13/2021   Pneumonia Vaccine 7+ Years old  Completed   DEXA SCAN  Completed   HPV VACCINES  Aged Out    Health Maintenance  Health Maintenance Due  Topic Date Due   Zoster Vaccines- Shingrix (1 of 2) Never done   TETANUS/TDAP  12/13/2020   COVID-19 Vaccine (5 - Booster for Pfizer series) 06/13/2021   INFLUENZA VACCINE  07/13/2021    Colorectal cancer screening: No longer required.   {Mammogram status:21018020}  {Bone Density status:21018021}  Lung Cancer Screening: (Low Dose CT Chest recommended if Age 28-80 years, 30 pack-year currently smoking OR have quit w/in 15years.) does qualify.   Lung Cancer Screening Referral: ***  Additional Screening:  Hepatitis C Screening: does not qualify  Vision Screening: Recommended annual ophthalmology exams for early detection of glaucoma and other disorders of the eye. Is the patient up to date with their annual eye exam?  {YES/NO:21197} Who is the provider or what is the name of  the office in which the patient attends annual eye exams? *** If pt is not established with a provider, would they like to be referred to a provider to establish  care? {YES/NO:21197}.   Dental Screening: Recommended annual dental exams for proper oral hygiene  Community Resource Referral / Chronic Care Management: CRR required this visit?  {YES/NO:21197}  CCM required this visit?  {YES/NO:21197}     Plan:     I have personally reviewed and noted the following in the patient's chart:   Medical and social history Use of alcohol, tobacco or illicit drugs  Current medications and supplements including opioid prescriptions.  Functional ability and status Nutritional status Physical activity Advanced directives List of other physicians Hospitalizations, surgeries, and ER visits in previous 12 months Vitals Screenings to include cognitive, depression, and falls Referrals and appointments  In addition, I have reviewed and discussed with patient certain preventive protocols, quality metrics, and best practice recommendations. A written personalized care plan for preventive services as well as general preventive health recommendations were provided to patient.   Due to this being a telephonic visit, the after visit summary with patients personalized plan was offered to patient via mail or my-chart. ***Patient declined at this time./ Patient would like to access on my-chart/ per request, patient was mailed a copy of AVS./ Patient preferred to pick up at office at next visit.   Loma Messing, LPN   [id number]   Nurse Health Advisor  Nurse Notes: none

## 2021-11-19 ENCOUNTER — Ambulatory Visit: Payer: PPO

## 2021-11-19 ENCOUNTER — Telehealth: Payer: Self-pay

## 2021-11-19 NOTE — Telephone Encounter (Signed)
Several attempts made ([phone]) in regards to annual wellness visit today @ 2:45pm. Left VM advising to reschedule appointment when available. TM

## 2021-11-24 ENCOUNTER — Telehealth: Payer: Self-pay | Admitting: Family Medicine

## 2021-11-24 NOTE — Chronic Care Management (AMB) (Signed)
°  Chronic Care Management   Outreach Note  11/24/2021 Name: CATLYN SHIPTON MRN: [id number] DOB: December 09, 1932  Referred by: Tonia Ghent, MD Reason for referral : No chief complaint on file.   An unsuccessful telephone outreach was attempted today. The patient was referred to the pharmacist for assistance with care management and care coordination.   Follow Up Plan:   Tatjana Dellinger Upstream Scheduler

## 2021-11-30 ENCOUNTER — Telehealth: Payer: Self-pay | Admitting: Family Medicine

## 2021-11-30 NOTE — Chronic Care Management (AMB) (Signed)
°  Chronic Care Management   Note  11/30/2021 Name: Julie Phillips MRN: [id number] DOB: 30-Jun-1932  Julie Phillips is a 87 y.o. year old female who is a primary care patient of Tonia Ghent, MD. I reached out to [name] by phone today in response to a referral sent by [name] PCP, Tonia Ghent, MD.   [name] was given information about Chronic Care Management services today including:  CCM service includes personalized support from designated clinical staff supervised by her physician, including individualized plan of care and coordination with other care providers 24/7 contact phone numbers for assistance for urgent and routine care needs. Service will only be billed when office clinical staff spend 20 minutes or more in a month to coordinate care. Only one practitioner may furnish and bill the service in a calendar month. The patient may stop CCM services at any time (effective at the end of the month) by phone call to the office staff.   Patient agreed to services and verbal consent obtained.   Follow up plan:   Tatjana Secretary/administrator

## 2021-12-08 ENCOUNTER — Telehealth: Payer: Self-pay | Admitting: Cardiovascular Disease

## 2021-12-08 NOTE — Telephone Encounter (Signed)
Pt c/o of Chest Pain: STAT if CP now or developed within 24 hours  1. Are you having CP right now? No, started 7am Saturday morning stayed til 8p Sunday night  2. Are you experiencing any other symptoms (ex. SOB, nausea, vomiting, sweating)? no  3. How long have you been experiencing CP? Stayed about 2 days  4. Is your CP continuous or coming and going? It was continuous   5. Have you taken Nitroglycerin? No, does not have any  ?

## 2021-12-08 NOTE — Telephone Encounter (Signed)
Was able to return call to Mrs. Pascale to f/u on her CP that she experienced on Sat. Pt reports early Saturday morning she experienced CP that lasted until late Sunday night, at times the pain radiated down her left arm, like an "acting" sensation. Pt dose not have Nitro to take and did not seek medical attention. No vitals to report during those times.  Pt reports no pain at current.   Advised that if pain returns please seek the ER or call 911, there they can obtain a EKG and get a set of vitals. The ER can obtain a cardiac marker troponin to see if there was any sort of cardiac damage to pt's heart muscles to indicate cardiac event. Mrs. Cuneo verbalized understanding. Not wanting to go at current d/t family coming over for holiday, but agreed if pain returns she will then seek the ER.   Asked if gastric in nature, pt reports intermittently over the past 2 months has had CP after eating, but chalked it up to acid reflux as she this seemed to occur after wheat ingestion and she has an allergy to wheat per pt. But this CP she experienced over the weekend did not involved food intake.   Pt continues her Prilosec 20 mg daily, suggested if CP or acid reflux occurs after meals, try OTC Pepcid or Tums to see if that eases her symptoms if GI related, if no improvement then seek the ER if the CP persists as she does not have Nitro to take. Mrs. Sames verbalized understanding.   At this time all questions were address and no additional concerns at this time. Mrs. Sawka thankful for the return call and advice. Agreeable to plan, will call back for anything further.    Appt made for 1/23 at 08:00 am with Dr. Rockey Situ to f/u on CP.

## 2022-01-01 ENCOUNTER — Other Ambulatory Visit: Payer: Self-pay

## 2022-01-01 ENCOUNTER — Ambulatory Visit: Payer: PPO | Admitting: Cardiovascular Disease

## 2022-01-01 ENCOUNTER — Encounter: Payer: Self-pay | Admitting: Cardiovascular Disease

## 2022-01-01 VITALS — BP 132/72 | HR 81 | Ht 61.0 in | Wt 231.0 lb

## 2022-01-01 DIAGNOSIS — I10 Essential (primary) hypertension: Secondary | ICD-10-CM

## 2022-01-01 DIAGNOSIS — R079 Chest pain, unspecified: Secondary | ICD-10-CM

## 2022-01-01 DIAGNOSIS — I480 Paroxysmal atrial fibrillation: Secondary | ICD-10-CM | POA: Diagnosis not present

## 2022-01-01 MED ORDER — NITROGLYCERIN 0.4 MG SL SUBL: 0.4000 mg | SUBLINGUAL_TABLET | SUBLINGUAL | 3 refills | Status: AC | PRN

## 2022-01-01 NOTE — Progress Notes (Signed)
Patient ID: Julie Phillips, female   DOB: 1932-03-14, 88 y.o.   MRN: [id number] Cardiology Office Note  Date:  01/01/2022   ID:  Julie Phillips, DOB Apr 13, 1932, MRN [id number]  PCP:  Tonia Ghent, MD   Chief Complaint  Patient presents with   Follow-up    Follow up and medications verbally reviewed with patient. Per patient she had an episode on 12/05/2021.     HPI:  Julie Phillips  is a pleasant 88 year-old woman with remote history of  atrial fibrillation in 2007,  not on anticoagulation (no recurrence in 13 yrs) In [place] in setting of hypokalemia (did stress test/pharmacological study) low potassium by her report,  previously on warfarin, stopped, was falling alot hypertension  who presents for routine followup of her atrial fibrillation.  Reports having periodic chest pain Episodes are presenting at rest, rare, 1 episode every 1 to 2 months End of oct, chest pain spell, resolved on its own, felt it was gastrointestinal related  Chest pain 12/05/2021, was still in bed Lasted 24 hrs [facility], whole left side including left breast area up to left shoulder Did not seek medical attention, resolved on its own  Has not had any further episodes of chest pain since that time and has been relatively active, shopping etc. If she overdoes it gets tired has to have a rest day then ready to go again  Trace ankle swelling Getting tired  No falls  Uses a walker to get around No exercise program  Lab work reviewed Total chol 205 Does not want a statin  HomeEKG personally reviewed by myself on todays visit Shows NSR rate 81 bpm with no ST or T wave changes  Other past medical history She has tried statins in the past and had a problem with her "urine". Details are uncertain  Previous echocardiogram that showed mild MR and TR, normal LV systolic function   Stress test was normal showing no ischemia with normal ejection fraction . Carotid ultrasound showed minimal chronic  plaquing  PMH:   has a past medical history of AF (atrial fibrillation) ([place]), Fall, GERD (gastroesophageal reflux disease), History of chicken pox, colonic polyps, Hyperlipidemia, Hypertension, Hypothyroidism, Keratopathy (2018), and LVH (left ventricular hypertrophy).  PSH:    Past Surgical History:  Procedure Laterality Date   CARDIOVASCULAR STRESS TEST  2011   normal    keratopathy      Current Outpatient Medications  Medication Sig Dispense Refill   aspirin EC 81 MG tablet Take 1 tablet (81 mg total) by mouth daily. (Patient taking differently: Take 162 mg by mouth daily.) 90 tablet 3   Cyanocobalamin (VITAMIN B-12 PO) Take 2,500 mg by mouth daily.     diphenhydrAMINE (BENADRYL) 25 mg capsule Take 1 capsule (25 mg total) by mouth daily as needed.     gabapentin (NEURONTIN) 100 MG capsule TAKE 1 CAPSULE BY MOUTH 3 TIMES DAILY ASNEEDED 270 capsule 3   levothyroxine (SYNTHROID) 100 MCG tablet TAKE 1 TABLET BY MOUTH ONCE A DAY BEFOREBREAKFAST. 90 tablet 3   loratadine (CLARITIN) 10 MG tablet Take 1 tablet (10 mg total) by mouth daily as needed.     losartan (COZAAR) 100 MG tablet Take 1 tablet (100 mg total) by mouth daily. 90 tablet 3   Lutein-Zeaxanthin 15-0.7 MG CAPS Take by mouth. Take 20 mg in the PO and 45 mg in the AM     metoprolol succinate (TOPROL-XL) 50 MG 24 hr tablet Take with or immediately  following a meal. 90 tablet 3   Multiple Vitamins-Minerals (VISION FORMULA) TABS Take by mouth daily.     omeprazole (PRILOSEC) 20 MG capsule Take 1 capsule (20 mg total) by mouth daily. 90 capsule 3   Polyethyl Glycol-Propyl Glycol (SYSTANE) 0.4-0.3 % GEL ophthalmic gel Place 1 application into both eyes.     potassium chloride (KLOR-CON) 10 MEQ tablet Take 1 tablet (10 mEq total) by mouth daily. 90 tablet 3   No current facility-administered medications for this visit.     Allergies:   Statins, Wheat, Amlodipine, Eucalyptus oil, and Lisinopril   Social History:  The patient   reports that she quit smoking about 50 years ago. Her smoking use included cigarettes. She has a 40.00 pack-year smoking history. She has never used smokeless tobacco. She reports that she does not currently use alcohol. She reports that she does not use drugs.   Family History:   family history includes Breast cancer in her daughter and sister; Heart disease in her father and mother.   Review of Systems  Constitutional: Negative.   HENT: Negative.    Respiratory: Negative.    Cardiovascular: Negative.   Gastrointestinal: Negative.   Musculoskeletal: Negative.        Leg weakness, poor balance  Neurological: Negative.   Psychiatric/Behavioral: Negative.    All other systems reviewed and are negative.   PHYSICAL EXAM: VS:  BP 132/72 (BP Location: Left Arm, Patient Position: Sitting, Cuff Size: Large)    Pulse 81    Ht 5\' 1"  (1.549 m)    Wt 231 lb (104.8 kg)    SpO2 91%    BMI 43.65 kg/m  , BMI Body mass index is 43.65 kg/m. Constitutional:  oriented to person, place, and time. No distress.  HENT:  Head: Grossly normal Eyes:  no discharge. No scleral icterus.  Neck: No JVD, no carotid bruits  Cardiovascular: Regular rate and rhythm, no murmurs appreciated Pulmonary/Chest: Clear to auscultation bilaterally, no wheezes or rails Abdominal: Soft.  no distension.  no tenderness.  Musculoskeletal: Normal range of motion Neurological:  normal muscle tone. Coordination normal. No atrophy Skin: Skin warm and dry Psychiatric: normal affect, pleasant  Recent Labs: 07/10/2021: ALT 8; BUN 11; Creat 0.73; Hemoglobin 12.2; Platelets 272; Potassium 4.2; Sodium 138 09/14/2021: TSH 1.26    Lipid Panel Lab Results  Component Value Date   CHOL 199 07/10/2021   HDL 49 (L) 07/10/2021   LDLCALC 127 (H) 07/10/2021   TRIG 124 07/10/2021      Wt Readings from Last 3 Encounters:  01/01/22 231 lb (104.8 kg)  11/02/21 233 lb 6 oz (105.9 kg)  07/10/21 232 lb (105.2 kg)     ASSESSMENT AND  PLAN:  Paroxysmal atrial fibrillation (HCC) -  atrial fibrillation in the setting of hypotension 12 years ago 2007 .  possibly from being on HCTZ,  Warfarin previously held for falls and risk of injury -Denies any tachypalpitations concerning for recurrent arrhythmia, normal sinus rhythm today  HLD (hyperlipidemia) Does not want a statin, most recent cholesterol around 200  Chest pain Atypical in nature, episode in October self resolved, episode December 05, 2021 Does not seem to happen with exertion, very rare episodes at rest Recommend she try nitro sublingual for possible esophageal or coronary spasm She does not feel it is musculoskeletal For more frequent episodes, ischemic work-up could be performed Not a great candidate for Myoview given body habitus and prior reaction Potentially could try cardiac CTA  Essential hypertension Blood  pressure is well controlled on today's visit. No changes made to the medications.  Morbid obesity Unable to exercise given gait limitations, calorie restriction recommended  Leg swelling Dependent edema.  Minimal ,  No significant change  GERD Better on omeprazole   Total encounter time more than 25 minutes  Greater than 50% was spent in counseling and coordination of care with the patient     No orders of the defined types were placed in this encounter.    Signed, Esmond Plants, M.D., Ph.D. 01/01/2022  [place], [place]

## 2022-01-01 NOTE — Patient Instructions (Addendum)
Medication Instructions:   nitroGLYCERIN (NITROSTAT) -  Place 1 tablet (0.4 mg total) under the tongue every 5 (five) minutes as needed for chest pain.        If you need a refill on your cardiac medications before your next appointment, please call your pharmacy.   Lab work: No new labs needed  Testing/Procedures: No new testing needed  - Call if you have more chest pain, We could order a cardiac CTA  Follow-Up: At [facility], you and your health needs are our priority.  As part of our continuing mission to provide you with exceptional heart care, we have created designated Provider Care Teams.  These Care Teams include your primary Cardiologist (physician) and Advanced Practice Providers (APPs -  Physician Assistants and Nurse Practitioners) who all work together to provide you with the care you need, when you need it.  You will need a follow up appointment in 12 months  Providers on your designated Care Team:   Murray Hodgkins, NP Christell Faith, PA-C Cadence Kathlen Mody, [place]  COVID-19 Vaccine Information can be found at: [email or web address] For questions related to vaccine distribution or appointments, please email [email or web address] or call [phone].

## 2022-01-04 ENCOUNTER — Ambulatory Visit: Payer: PPO | Admitting: Cardiovascular Disease

## 2022-01-13 ENCOUNTER — Telehealth: Payer: Self-pay

## 2022-01-13 NOTE — Chronic Care Management (AMB) (Signed)
Chronic Care Management Pharmacy Assistant   Name: Julie Phillips  MRN: [id number] DOB: 1932-06-17  Julie Phillips is an 88 y.o. year old female who presents for his initial CCM visit with the clinical pharmacist.  Reason for Encounter: Initial Questions   Conditions to be addressed/monitored: HTN and HLD  Recent office visits:  None in 6 months  Recent consult visits:  01/01/22-Cardiology-Timothy Gollan,MD-Patient presented for chest pain.Recommend she try nitro sublingual for possible esophageal or coronary spasm,could order cardiac CTA if chest pain continues. 11/02/21-Cardiology-Timothy Gollan,MD-Patient presents for follow up atrial fibrillation.EKG  Hospital visits:  None in previous 6 months  Medications: Outpatient Encounter Medications as of 01/13/2022  Medication Sig   aspirin EC 81 MG tablet Take 1 tablet (81 mg total) by mouth daily. (Patient taking differently: Take 162 mg by mouth daily.)   Cyanocobalamin (VITAMIN B-12 PO) Take 2,500 mg by mouth daily.   diphenhydrAMINE (BENADRYL) 25 mg capsule Take 1 capsule (25 mg total) by mouth daily as needed.   gabapentin (NEURONTIN) 100 MG capsule TAKE 1 CAPSULE BY MOUTH 3 TIMES DAILY ASNEEDED   levothyroxine (SYNTHROID) 100 MCG tablet TAKE 1 TABLET BY MOUTH ONCE A DAY BEFOREBREAKFAST.   loratadine (CLARITIN) 10 MG tablet Take 1 tablet (10 mg total) by mouth daily as needed.   losartan (COZAAR) 100 MG tablet Take 1 tablet (100 mg total) by mouth daily.   Lutein-Zeaxanthin 15-0.7 MG CAPS Take by mouth. Take 20 mg in the PO and 45 mg in the AM   metoprolol succinate (TOPROL-XL) 50 MG 24 hr tablet Take with or immediately following a meal.   Multiple Vitamins-Minerals (VISION FORMULA) TABS Take by mouth daily.   nitroGLYCERIN (NITROSTAT) 0.4 MG SL tablet Place 1 tablet (0.4 mg total) under the tongue every 5 (five) minutes as needed for chest pain.   omeprazole (PRILOSEC) 20 MG capsule Take 1 capsule (20 mg total) by mouth  daily.   Polyethyl Glycol-Propyl Glycol (SYSTANE) 0.4-0.3 % GEL ophthalmic gel Place 1 application into both eyes.   potassium chloride (KLOR-CON) 10 MEQ tablet Take 1 tablet (10 mEq total) by mouth daily.   No facility-administered encounter medications on file as of 01/13/2022.    No results found for: HGBA1C, MICROALBUR   BP Readings from Last 3 Encounters:  01/01/22 132/72  11/02/21 (!) 150/78  07/10/21 118/62    Patient contacted to review initial questions prior to visit with Charlene Brooke.  Have you seen any other providers since your last visit with PCP? Yes Cardiology  Any changes in your medications or health? No  Any side effects from any medications? No  Do you have an symptoms or problems not managed by your medications? No  Any concerns about your health right now? No  Has your provider asked that you check blood pressure, blood sugar, or follow special diet at home? Yes The patient reports she has a BP monitor  Do you get any type of exercise on a regular basis? No  Can you think of a goal you would like to reach for your health? No  Do you have any problems getting your medications? No  [place] [facility] is good to her and will deliver medications   Is there anything that you would like to discuss during the appointment? No The patient did report that her thyroid medication price just increased.   Spoke with patient and reminded them to have all medications, supplements and any blood glucose and blood pressure readings available  for review with pharmacist, at their telephone visit on 01/15/22 at 11:00am.   [name] Rating Drugs:  Medication:  Last Fill: Day Supply Losartan 100mg  11/23/21 90   Care Gaps: Annual wellness visit in last year? No Most Recent BP reading:132/72  81-P 01/01/22  If Diabetic: Most recent A1C reading: Last eye exam / retinopathy screening: Last diabetic foot exam:  Marjo Bicker CPP notified  Avel Sensor,  [place] Assistant [phone]  Total time spent for month CPA: 40 min

## 2022-01-15 ENCOUNTER — Ambulatory Visit (INDEPENDENT_AMBULATORY_CARE_PROVIDER_SITE_OTHER): Payer: PPO | Admitting: Pharmacist

## 2022-01-15 ENCOUNTER — Other Ambulatory Visit: Payer: Self-pay

## 2022-01-15 DIAGNOSIS — E782 Mixed hyperlipidemia: Secondary | ICD-10-CM

## 2022-01-15 DIAGNOSIS — I480 Paroxysmal atrial fibrillation: Secondary | ICD-10-CM

## 2022-01-15 DIAGNOSIS — E039 Hypothyroidism, unspecified: Secondary | ICD-10-CM

## 2022-01-15 DIAGNOSIS — K219 Gastro-esophageal reflux disease without esophagitis: Secondary | ICD-10-CM

## 2022-01-15 DIAGNOSIS — I10 Essential (primary) hypertension: Secondary | ICD-10-CM

## 2022-01-15 NOTE — Progress Notes (Signed)
Chronic Care Management Pharmacy Note  01/15/2022 Name:  Julie Phillips MRN:  [id number] DOB:  1932-12-12  Summary: -CCM initial visit: pt endorses compliance with medications -Pt recently changed how she takes levothyroxine - she used to take it with AM coffee, now takes in middle of night when she wakes up. TSH was at goal (1.26-09/14/21) after dose reduction last year, it may or may not be effected by change in administration technique  Recommendations/Changes made from today's visit: -Repeat TSH at upcoming physical -No med changes  Plan: -[place] will call patient 3 months for adherence review -Pharmacist follow up televisit scheduled for 6 months -PCP follow up 02/12/22   Subjective: Julie Phillips is an 88 y.o. year old female who is a primary patient of Damita Dunnings, Elveria Rising, MD.  The CCM team was consulted for assistance with disease management and care coordination needs.    Engaged with patient by telephone for initial visit in response to provider referral for pharmacy case management and/or care coordination services.   Consent to Services:  The patient was given the following information about Chronic Care Management services today, agreed to services, and gave verbal consent: 1. CCM service includes personalized support from designated clinical staff supervised by the primary care provider, including individualized plan of care and coordination with other care providers 2. 24/7 contact phone numbers for assistance for urgent and routine care needs. 3. Service will only be billed when office clinical staff spend 20 minutes or more in a month to coordinate care. 4. Only one practitioner may furnish and bill the service in a calendar month. 5.The patient may stop CCM services at any time (effective at the end of the month) by phone call to the office staff. 6. The patient will be responsible for cost sharing (co-pay) of up to 20% of the service fee (after annual deductible  is met). Patient agreed to services and consent obtained.  Patient Care Team: Tonia Ghent, MD as PCP - General (Family Medicine) Rockey Situ Kathlene November, MD as PCP - Cardiology (Cardiology) Minna Merritts, MD as Consulting Physician (Cardiology) Doyce Para, MD as Consulting Physician (Ophthalmology) Estill Cotta, MD as Consulting Physician (Ophthalmology) Charlton Haws, [facility] as Pharmacist (Pharmacist)  Recent office visits: 07/10/21 Dr Damita Dunnings OV: annual visit. C/O tremor. Reduced levothyroxine to 100 mcg (TSH 0.32)  Recent consult visits: 01/01/22-Cardiology-Timothy Gollan,MD-Patient presented for chest pain.Recommend she try nitro sublingual for possible esophageal or coronary spasm,could order cardiac CTA if chest pain continues.  11/02/21-Cardiology-Timothy Gollan,MD-Patient presents for follow up atrial fibrillation.EKG  Hospital visits: None in previous 6 months   Objective:  Lab Results  Component Value Date   CREATININE 0.73 07/10/2021   BUN 11 07/10/2021   GFR 93.04 08/04/2020   GFRNONAA 78 03/25/2020   GFRAA 90 03/25/2020   NA 138 07/10/2021   K 4.2 07/10/2021   CALCIUM 9.3 07/10/2021   CO2 25 07/10/2021   GLUCOSE 123 (H) 07/10/2021    Lab Results  Component Value Date/Time   GFR 93.04 08/04/2020 11:47 AM   GFR 80.91 07/09/2019 11:26 AM    Last diabetic Eye exam: No results found for: HMDIABEYEEXA  Last diabetic Foot exam: No results found for: HMDIABFOOTEX   Lab Results  Component Value Date   CHOL 199 07/10/2021   HDL 49 (L) 07/10/2021   LDLCALC 127 (H) 07/10/2021   LDLDIRECT 152.6 12/31/2013   TRIG 124 07/10/2021   CHOLHDL 4.1 07/10/2021    Hepatic Function Latest  Ref Rng & Units 07/10/2021 08/04/2020 03/25/2020  Total Protein 6.1 - 8.1 g/dL 6.7 7.1 6.8  Albumin 3.5 - 5.2 g/dL - 3.9 3.9  AST 10 - 35 U/L _0 ALT 6 - 29 U/L _1 Alk Phosphatase 39 - 117 U/L - 101 123(H)  Total Bilirubin 0.2 - 1.2 mg/dL 0.4 0.5 0.3    Lab  Results  Component Value Date/Time   TSH 1.26 09/14/2021 09:55 AM   TSH 0.32 (L) 07/10/2021 03:08 PM    CBC Latest Ref Rng & Units 07/10/2021 03/25/2020 12/31/2013  WBC 3.8 - 10.8 Thousand/uL 9.9 9.8 8.4  Hemoglobin 11.7 - 15.5 g/dL 12.2 12.5 12.5  Hematocrit 35.0 - 45.0 % 37.1 37.4 37.1  Platelets 140 - 400 Thousand/uL 272 312 271.0    Lab Results  Component Value Date/Time   VD25OH 35 07/10/2021 03:08 PM   VD25OH 31.42 08/04/2020 11:47 AM   VD25OH 34.70 07/09/2019 11:26 AM    Clinical ASCVD: No  The ASCVD Risk score (Arnett DK, et al., 2019) failed to calculate for the following reasons:   The 2019 ASCVD risk score is only valid for ages 65 to 33    Depression screen PHQ 2/9 07/09/2019 06/30/2018 06/22/2017  Decreased Interest 0 0 1  Down, Depressed, Hopeless 0 0 0  PHQ - 2 Score 0 0 1  Altered sleeping 0 0 -  Tired, decreased energy 0 0 -  Change in appetite 0 0 -  Feeling bad or failure about yourself  0 0 -  Trouble concentrating 0 0 -  Moving slowly or fidgety/restless 0 0 -  Suicidal thoughts 0 0 -  PHQ-9 Score 0 0 -  Difficult doing work/chores - Not difficult at all -     CHA2DS2/VAS Stroke Risk Points  Current as of 2 days ago (Wednesday)     4 >= 2 Points: High Risk  1 - 1.99 Points: Medium Risk  0 Points: Low Risk    Last Change: N/A      Points Metrics  0 Has Congestive Heart Failure:  No    Current as of 2 days ago (Wednesday)  0 Has Vascular Disease:  No    Current as of 2 days ago (Wednesday)  1 Has Hypertension:  Yes    Current as of 2 days ago (Wednesday)  2 Age:  22    Current as of 2 days ago (Wednesday)  0 Has Diabetes:  No    Current as of 2 days ago (Wednesday)  0 Had Stroke:  No  Had TIA:  No  Had Thromboembolism:  No    Current as of 2 days ago (Wednesday)  1 Female:  Yes    Current as of 2 days ago (Wednesday)      Social History   Tobacco Use  Smoking Status Former   Packs/day: 2.00   Years: 20.00   Pack years: 40.00   Types:  Cigarettes   Quit date: 12/14/1971   Years since quitting: 50.1  Smokeless Tobacco Never   BP Readings from Last 3 Encounters:  01/01/22 132/72  11/02/21 (!) 150/78  07/10/21 118/62   Pulse Readings from Last 3 Encounters:  01/01/22 81  11/02/21 69  07/10/21 88   Wt Readings from Last 3 Encounters:  01/01/22 231 lb (104.8 kg)  11/02/21 233 lb 6 oz (105.9 kg)  07/10/21 232 lb (105.2 kg)   BMI Readings from Last 3 Encounters:  01/01/22 43.65 kg/m  11/02/21 44.10 kg/m  07/10/21 43.84 kg/m    Assessment/Interventions: Review of patient past medical history, allergies, medications, health status, including review of consultants reports, laboratory and other test data, was performed as part of comprehensive evaluation and provision of chronic care management services.   SDOH:  (Social Determinants of Health) assessments and interventions performed: Yes SDOH Interventions    Flowsheet Row Most Recent Value  SDOH Interventions   Food Insecurity Interventions Intervention Not Indicated  Financial Strain Interventions Intervention Not Indicated      SDOH Screenings   Alcohol Screen: Not on file  Depression (PHQ2-9): Not on file  Financial Resource Strain: Low Risk    Difficulty of Paying Living Expenses: Not very hard  Food Insecurity: No Food Insecurity   Worried About Running Out of Food in the Last Year: Never true   Ran Out of Food in the Last Year: Never true  Housing: Not on file  Physical Activity: Not on file  Social Connections: Not on file  Stress: Not on file  Tobacco Use: Medium Risk   Smoking Tobacco Use: Former   Smokeless Tobacco Use: Never   Passive Exposure: Not on file  Transportation Needs: Not on file    [place]  Allergies  Allergen Reactions   Statins     Myalgias   Wheat     Throat swelling   Amlodipine     hematuria   Eucalyptus Oil    Lisinopril     Unrecalled BP med- possibly lisinopril- intolerant, had cough on the medicine-  changed to cozaar    Medications Reviewed Today     Reviewed by Charlton Haws, [place] (Pharmacist) on 01/15/22 at 1146  Med List Status: <None>   Medication Order Taking? Sig Documenting Provider Last Dose Status Informant  aspirin EC 81 MG tablet [id number] Yes Take 1 tablet (81 mg total) by mouth daily.  Patient taking differently: Take 162 mg by mouth daily.   Minna Merritts, MD Taking Active   Cyanocobalamin (VITAMIN B-12 PO) [id number] Yes Take 2,500 mg by mouth daily. [provider] Taking Active   diphenhydrAMINE (BENADRYL) 25 mg capsule [id number] Yes Take 1 capsule (25 mg total) by mouth daily as needed. Tonia Ghent, MD Taking Active   gabapentin (NEURONTIN) 100 MG capsule [id number] Yes TAKE 1 CAPSULE BY MOUTH 3 TIMES DAILY ASNEEDED Tonia Ghent, MD Taking Active   levothyroxine (SYNTHROID) 100 MCG tablet [id number] Yes TAKE 1 TABLET BY MOUTH ONCE A DAY BEFOREBREAKFAST. Tonia Ghent, MD Taking Active   loratadine (CLARITIN) 10 MG tablet [id number] Yes Take 1 tablet (10 mg total) by mouth daily as needed. Tonia Ghent, MD Taking Active   losartan (COZAAR) 100 MG tablet [id number] Yes Take 1 tablet (100 mg total) by mouth daily. Tonia Ghent, MD Taking Active   Patient not taking:  Discontinued 01/15/22 1146 (Patient Preference) metoprolol succinate (TOPROL-XL) 50 MG 24 hr tablet [id number] Yes Take with or immediately following a meal. Tonia Ghent, MD Taking Active   Patient not taking:  Discontinued 01/15/22 1146 (Patient Preference)   nitroGLYCERIN (NITROSTAT) 0.4 MG SL tablet [id number] Yes Place 1 tablet (0.4 mg total) under the tongue every 5 (five) minutes as needed for chest pain. Minna Merritts, MD Taking Active   omeprazole (PRILOSEC) 20 MG capsule [id number] Yes Take 1 capsule (20 mg total) by mouth daily. Tonia Ghent, MD Taking Active   Polyethyl Glycol-Propyl Glycol (SYSTANE) 0.4-0.3 % GEL ophthalmic gel  [id number] Yes Place 1  application into both eyes. [provider] Taking Active   potassium chloride (KLOR-CON) 10 MEQ tablet [id number] Yes Take 1 tablet (10 mEq total) by mouth daily. 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   Risk for falls 06/21/2016   Rotator cuff disorder 12/10/2015   SOB (shortness of breath) 12/10/2015   Osteopenia 02/13/2015   Advance care planning 01/15/2015   Aspiration into airway 10/08/2013   Food allergy 07/09/2013   Medicare annual wellness visit, subsequent 12/26/2012   Knee pain 07/20/2012   Rash 04/11/2012   Hypothyroid 04/11/2012   LVH (left ventricular hypertrophy) 01/17/2012   AF (atrial fibrillation) ([place]) 01/06/2012   Obesity 01/06/2012   Essential hypertension 01/06/2012   Mixed hyperlipidemia 01/06/2012    Immunization History  Administered Date(s) Administered   Fluad Quad(high Dose 65+) 09/11/2019, 11/09/2019   Influenza Split 10/04/2012   Influenza,inj,Quad PF,6+ Mos 10/08/2013, 09/25/2015, 09/14/2016, 10/06/2017   Influenza-Unspecified 09/13/2011, 08/13/2014, 10/13/2018   PFIZER(Purple Top)SARS-COV-2 Vaccination 01/03/2020, 01/21/2020, 09/11/2020, 04/18/2021   Pneumococcal Conjugate-13 08/13/2014   Pneumococcal Polysaccharide-23 12/13/2010   Td 12/13/2010    Conditions to be addressed/monitored:  Hypertension, Hyperlipidemia, Atrial Fibrillation, GERD, Hypothyroidism, and Osteopenia  Care Plan : [place]  Updates made by Charlton Haws, [place] since 01/15/2022 12:00 AM     Problem: Hypertension, Hyperlipidemia,  Atrial Fibrillation, GERD, Hypothyroidism, and Osteopenia   Priority: High     Long-Range Goal: Disease mgmt   Start Date: 01/15/2022  Expected End Date: 01/15/2023  This Visit's Progress: On track  Priority: High  Note:   Current Barriers:  Unable to independently monitor therapeutic efficacy  Pharmacist Clinical Goal(s):  Patient will achieve adherence to monitoring guidelines and medication adherence to achieve therapeutic efficacy through collaboration with PharmD and provider.   Interventions: 1:1 collaboration with Tonia Ghent, MD regarding development and update of comprehensive plan of care as evidenced by provider attestation and co-signature Inter-disciplinary care team collaboration (see longitudinal plan of care) Comprehensive medication review performed; medication list updated in electronic medical record  Hypertension (BP goal <140/90) -Controlled - BP at goal in recent office visits; pt does not check BP at home regularly; she endorses compliance with medication -Current treatment: Losartan 100 mg daily PM - Appropriate, Effective, Safe, Accessible Metoprolol succinate 50 mg daily AM - Appropriate, Effective, Safe, Accessible -Medications previously tried: amlodipine, lisinopril  -Denies hypotensive/hypertensive symptoms -Educated on BP goals and benefits of medications for prevention of heart attack, stroke and kidney damage; -Counseled to monitor BP at home periodically -Recommended to continue current medication  Atrial Fibrillation (Goal: prevent stroke and major bleeding) -Controlled - pt reports she has "thick blood" and wants to continue taking aspirin; she denies history of blood clots; she denies bleeding issues -CHADSVASC: 4; Dx 2007, no recurrence since so not on anticoagulation -Current treatment: Metoprolol succinate 50 mg daily - Appropriate, Effective, Safe, Accessible Aspirin 81 mg daily - Appropriate, Effective, Safe, Accessible -Medications  previously tried: warfarin (stopped due to frequent falls) -Recommended to continue current medication  Hyperlipidemia: (LDL goal < 130) -Controlled - LDL somewhat elevated, given lack of established ASCVD and pt age it is reasonable to forego statin therapy; -pt has not used NTG yet;  -Current treatment: Nitroglycerin 0.4 mg SL prn -Appropriate, Effective, Safe, Accessible -Medications previously tried: ezetimibe,   -Educated on Cholesterol goals;   Osteopenia (Goal prevent fractures) -Not ideally controlled -pt is not taking calcium or vitamin D supplements -Last DEXA Scan: 02/2015   T-Score femoral neck: -1.6  T-Score forearm radius: 0.0  10-year probability of major osteoporotic fracture: 12.2%  10-year probability of hip fracture: 2.2% -Patient is not a candidate for pharmacologic treatment -Current treatment  None -Medications previously tried: none  -Recommend weight-bearing and muscle strengthening exercises for building and maintaining bone density.  Hypothyroidism (Goal: maintain TSH in goal range) -Controlled - pt just started separating levothyroxine from AM coffee - now takes early in morning when she wakes up; previously TSH was at goal after dose reduction; pt has physical in 1 month, will likely recheck TSH then -Pt reports levothyroxine copay increased to $10/month ($30 per 90 days) this year; she can consider switching this to [employer] ($4 list) -Current treatment  Levothyroxine 100 mcg daily -Appropriate, Effective, Safe, Accessible -Discussed absorption of levothyroxine is optimal on empty stomach, separate from other meds;  -Recommended to continue current medication  GERD (Goal: manage symptoms) -Controlled - reflux was worse at night so she takes PPI at night -Current treatment  Omeprazole 20 mg daily PM -Appropriate, Effective, Safe, Accessible TUMS prn -Recommended to continue current medication  Pain (Goal: manage pain) -Controlled - pt does not take  gabapentin often -"cracked disc" in back since teenage years -Current treatment  Gabapentin 100 mg TID prn -Appropriate, Effective, Safe, Accessible -Medications previously tried: n/a  -Recommended to continue current medication  Health Maintenance -Vaccine gaps: Flu, covid booster, TDAP, Shingrix -Flu shot 65+ @ [place] -Current therapy:  Vitamin B12 2500 mcg Lutein-Zeaxanthin Multivitamin [place] Con 10 mEq daily Benadryl 25 mg Loratadine 10 mg PRN Systane eye drops -Patient is satisfied with current therapy and denies issues -Recommended to continue current medication  Patient Goals/Self-Care Activities Patient will:  - take medications as prescribed as evidenced by patient report and record review focus on medication adherence by pill box      Medication Assistance: None required.  Patient affirms current coverage meets needs.  Compliance/Adherence/Medication fill history: Care Gaps: None  [name]-Rating Drugs: Losartan - LF 11/23/21 x 90 ds; [place] 85%  Patient's preferred pharmacy is:  [facility] [place], [place] [address] Phone: [phone] Fax: [phone] [place], [place] - [address] [place] [address] Phone: [phone] Fax: [facility] [address] [place] - [address] [address] [address] [address] [place] [address] Phone: [phone] Fax: [phone]  Uses pill box? Yes Pt endorses 100% compliance  We discussed: Current pharmacy is preferred with insurance plan and patient is satisfied with pharmacy services Patient decided to: Continue with current pharmacy  Care Plan and Follow Up Patient Decision:  Patient agrees to Care Plan and Follow-up.  Plan: Telephone follow up appointment with care management team member scheduled for:  6 months  Charlene Brooke, PharmD, BCACP Clinical  Pharmacist [facility] [phone]

## 2022-01-15 NOTE — Patient Instructions (Signed)
Visit Information  Phone number for Pharmacist: [phone]  Thank you for meeting with me to discuss your medications! I look forward to working with you to achieve your health care goals. Below is a summary of what we talked about during the visit:   Goals Addressed             This Visit's Progress    Manage My Medicine       Timeframe:  Long-Range Goal Priority:  Medium Start Date:     01/15/22                        Expected End Date:   01/15/23                    Follow Up Date Aug 2023   - call for medicine refill 2 or 3 days before it runs out - call if I am sick and can't take my medicine - keep a list of all the medicines I take; vitamins and herbals too - use a pillbox to sort medicine    Why is this important?   These steps will help you keep on track with your medicines.   Notes:         Care Plan : CCM Pharmacy Care Plan  Updates made by Charlton Haws, RPH since 01/15/2022 12:00 AM     Problem: Hypertension, Hyperlipidemia, Atrial Fibrillation, GERD, Hypothyroidism, and Osteopenia   Priority: High     Long-Range Goal: Disease mgmt   Start Date: 01/15/2022  Expected End Date: 01/15/2023  This Visit's Progress: On track  Priority: High  Note:   Current Barriers:  Unable to independently monitor therapeutic efficacy  Pharmacist Clinical Goal(s):  Patient will achieve adherence to monitoring guidelines and medication adherence to achieve therapeutic efficacy through collaboration with PharmD and provider.   Interventions: 1:1 collaboration with Tonia Ghent, MD regarding development and update of comprehensive plan of care as evidenced by provider attestation and co-signature Inter-disciplinary care team collaboration (see longitudinal plan of care) Comprehensive medication review performed; medication list updated in electronic medical record  Hypertension (BP goal <140/90) -Controlled - BP at goal in recent office visits; pt does not check BP  at home regularly; she endorses compliance with medication -Current treatment: Losartan 100 mg daily PM - Appropriate, Effective, Safe, Accessible Metoprolol succinate 50 mg daily AM - Appropriate, Effective, Safe, Accessible -Medications previously tried: amlodipine, lisinopril  -Denies hypotensive/hypertensive symptoms -Educated on BP goals and benefits of medications for prevention of heart attack, stroke and kidney damage; -Counseled to monitor BP at home periodically -Recommended to continue current medication  Atrial Fibrillation (Goal: prevent stroke and major bleeding) -Controlled - pt reports she has "thick blood" and wants to continue taking aspirin; she denies history of blood clots; she denies bleeding issues -CHADSVASC: 4; Dx 2007, no recurrence since so not on anticoagulation -Current treatment: Metoprolol succinate 50 mg daily - Appropriate, Effective, Safe, Accessible Aspirin 81 mg daily - Appropriate, Effective, Safe, Accessible -Medications previously tried: warfarin (stopped due to frequent falls) -Recommended to continue current medication  Hyperlipidemia: (LDL goal < 130) -Controlled - LDL somewhat elevated, given lack of established ASCVD and pt age it is reasonable to forego statin therapy; -pt has not used NTG yet;  -Current treatment: Nitroglycerin 0.4 mg SL prn -Appropriate, Effective, Safe, Accessible -Medications previously tried: ezetimibe,   -Educated on Cholesterol goals;   Osteopenia (Goal prevent fractures) -Not ideally controlled -pt is  not taking calcium or vitamin D supplements -Last DEXA Scan: 02/2015   T-Score femoral neck: -1.6  T-Score forearm radius: 0.0  10-year probability of major osteoporotic fracture: 12.2%  10-year probability of hip fracture: 2.2% -Patient is not a candidate for pharmacologic treatment -Current treatment  None -Medications previously tried: none  -Recommend weight-bearing and muscle strengthening exercises for  building and maintaining bone density.  Hypothyroidism (Goal: maintain TSH in goal range) -Controlled - pt just started separating levothyroxine from AM coffee - now takes early in morning when she wakes up; previously TSH was at goal after dose reduction; pt has physical in 1 month, will likely recheck TSH then -Pt reports levothyroxine copay increased to $10/month ($30 per 90 days) this year; she can consider switching this to [employer] ($4 list) -Current treatment  Levothyroxine 100 mcg daily -Appropriate, Effective, Safe, Accessible -Discussed absorption of levothyroxine is optimal on empty stomach, separate from other meds;  -Recommended to continue current medication  GERD (Goal: manage symptoms) -Controlled - reflux was worse at night so she takes PPI at night -Current treatment  Omeprazole 20 mg daily PM -Appropriate, Effective, Safe, Accessible TUMS prn -Recommended to continue current medication  Pain (Goal: manage pain) -Controlled - pt does not take gabapentin often -"cracked disc" in back since teenage years -Current treatment  Gabapentin 100 mg TID prn -Appropriate, Effective, Safe, Accessible -Medications previously tried: n/a  -Recommended to continue current medication  Health Maintenance -Vaccine gaps: Flu, covid booster, TDAP, Shingrix -Flu shot 65+ @ [place] -Current therapy:  Vitamin B12 2500 mcg Lutein-Zeaxanthin Multivitamin Klor Con 10 mEq daily Benadryl 25 mg Loratadine 10 mg PRN Systane eye drops -Patient is satisfied with current therapy and denies issues -Recommended to continue current medication  Patient Goals/Self-Care Activities Patient will:  - take medications as prescribed as evidenced by patient report and record review focus on medication adherence by pill box      Ms. Abramo was given information about Chronic Care Management services today including:  CCM service includes personalized support from designated clinical staff  supervised by her physician, including individualized plan of care and coordination with other care providers 24/7 contact phone numbers for assistance for urgent and routine care needs. Standard insurance, coinsurance, copays and deductibles apply for chronic care management only during months in which we provide at least 20 minutes of these services. Most insurances cover these services at 100%, however patients may be responsible for any copay, coinsurance and/or deductible if applicable. This service may help you avoid the need for more expensive face-to-face services. Only one practitioner may furnish and bill the service in a calendar month. The patient may stop CCM services at any time (effective at the end of the month) by phone call to the office staff.  Patient agreed to services and verbal consent obtained.   Patient verbalizes understanding of instructions and care plan provided today and agrees to view in [place]. Active MyChart status confirmed with patient.   Telephone follow up appointment with pharmacy team member scheduled for: 6 months  Charlene Brooke, PharmD, [facility] Clinical Pharmacist [facility] [phone]

## 2022-02-08 ENCOUNTER — Inpatient Hospital Stay
Admission: EM | Admit: 2022-02-08 | Discharge: 2022-02-10 | DRG: 309 | Disposition: A | Discharge status: Home / Self Care | Payer: PPO | Attending: Internal Medicine | Admitting: Internal Medicine

## 2022-02-08 ENCOUNTER — Emergency Department: Payer: PPO

## 2022-02-08 ENCOUNTER — Other Ambulatory Visit: Payer: Self-pay

## 2022-02-08 ENCOUNTER — Encounter: Payer: Self-pay | Admitting: *Deleted

## 2022-02-08 DIAGNOSIS — Z79890 Hormone replacement therapy (postmenopausal): Secondary | ICD-10-CM

## 2022-02-08 DIAGNOSIS — K219 Gastro-esophageal reflux disease without esophagitis: Secondary | ICD-10-CM | POA: Diagnosis not present

## 2022-02-08 DIAGNOSIS — Z9181 History of falling: Secondary | ICD-10-CM

## 2022-02-08 DIAGNOSIS — E669 Obesity, unspecified: Secondary | ICD-10-CM | POA: Diagnosis not present

## 2022-02-08 DIAGNOSIS — Z7982 Long term (current) use of aspirin: Secondary | ICD-10-CM | POA: Diagnosis not present

## 2022-02-08 DIAGNOSIS — I10 Essential (primary) hypertension: Secondary | ICD-10-CM | POA: Diagnosis present

## 2022-02-08 DIAGNOSIS — R7989 Other specified abnormal findings of blood chemistry: Secondary | ICD-10-CM | POA: Diagnosis present

## 2022-02-08 DIAGNOSIS — I4891 Unspecified atrial fibrillation: Secondary | ICD-10-CM | POA: Diagnosis not present

## 2022-02-08 DIAGNOSIS — Z888 Allergy status to other drugs, medicaments and biological substances status: Secondary | ICD-10-CM | POA: Diagnosis not present

## 2022-02-08 DIAGNOSIS — E039 Hypothyroidism, unspecified: Secondary | ICD-10-CM | POA: Diagnosis present

## 2022-02-08 DIAGNOSIS — I482 Chronic atrial fibrillation, unspecified: Secondary | ICD-10-CM | POA: Diagnosis present

## 2022-02-08 DIAGNOSIS — I499 Cardiac arrhythmia, unspecified: Secondary | ICD-10-CM | POA: Diagnosis not present

## 2022-02-08 DIAGNOSIS — R778 Other specified abnormalities of plasma proteins: Secondary | ICD-10-CM | POA: Diagnosis present

## 2022-02-08 DIAGNOSIS — Z20822 Contact with and (suspected) exposure to covid-19: Secondary | ICD-10-CM | POA: Diagnosis present

## 2022-02-08 DIAGNOSIS — Z91018 Allergy to other foods: Secondary | ICD-10-CM | POA: Diagnosis not present

## 2022-02-08 DIAGNOSIS — Z79899 Other long term (current) drug therapy: Secondary | ICD-10-CM

## 2022-02-08 DIAGNOSIS — Z8249 Family history of ischemic heart disease and other diseases of the circulatory system: Secondary | ICD-10-CM | POA: Diagnosis not present

## 2022-02-08 DIAGNOSIS — I480 Paroxysmal atrial fibrillation: Secondary | ICD-10-CM | POA: Diagnosis not present

## 2022-02-08 DIAGNOSIS — E782 Mixed hyperlipidemia: Secondary | ICD-10-CM | POA: Diagnosis present

## 2022-02-08 DIAGNOSIS — Z6841 Body Mass Index (BMI) 40.0 and over, adult: Secondary | ICD-10-CM | POA: Diagnosis not present

## 2022-02-08 DIAGNOSIS — Z87891 Personal history of nicotine dependence: Secondary | ICD-10-CM

## 2022-02-08 DIAGNOSIS — R0602 Shortness of breath: Secondary | ICD-10-CM | POA: Diagnosis not present

## 2022-02-08 DIAGNOSIS — Z91048 Other nonmedicinal substance allergy status: Secondary | ICD-10-CM

## 2022-02-08 DIAGNOSIS — I248 Other forms of acute ischemic heart disease: Secondary | ICD-10-CM | POA: Diagnosis not present

## 2022-02-08 DIAGNOSIS — R079 Chest pain, unspecified: Secondary | ICD-10-CM | POA: Diagnosis not present

## 2022-02-08 DIAGNOSIS — R000 Tachycardia, unspecified: Secondary | ICD-10-CM | POA: Diagnosis not present

## 2022-02-08 DIAGNOSIS — Z66 Do not resuscitate: Secondary | ICD-10-CM | POA: Diagnosis present

## 2022-02-08 LAB — CBC WITH DIFFERENTIAL/PLATELET
Abs Immature Granulocytes: 0.03 10*3/uL (ref 0.00–0.07)
Basophils Absolute: 0.1 10*3/uL (ref 0.0–0.1)
Basophils Relative: 1 %
Eosinophils Absolute: 0.3 10*3/uL (ref 0.0–0.5)
Eosinophils Relative: 3 %
HCT: 38.6 % (ref 36.0–46.0)
Hemoglobin: 12.6 g/dL (ref 12.0–15.0)
Immature Granulocytes: 0 %
Lymphocytes Relative: 16 %
Lymphs Abs: 1.8 10*3/uL (ref 0.7–4.0)
MCH: 28.5 pg (ref 26.0–34.0)
MCHC: 32.6 g/dL (ref 30.0–36.0)
MCV: 87.3 fL (ref 80.0–100.0)
Monocytes Absolute: 0.8 10*3/uL (ref 0.1–1.0)
Monocytes Relative: 7 %
Neutro Abs: 8.1 10*3/uL — ABNORMAL HIGH (ref 1.7–7.7)
Neutrophils Relative %: 73 %
Platelets: 265 10*3/uL (ref 150–400)
RBC: 4.42 MIL/uL (ref 3.87–5.11)
RDW: 13.5 % (ref 11.5–15.5)
WBC: 11.1 10*3/uL — ABNORMAL HIGH (ref 4.0–10.5)
nRBC: 0 % (ref 0.0–0.2)

## 2022-02-08 LAB — COMPREHENSIVE METABOLIC PANEL
ALT: 8 U/L (ref 0–44)
AST: 14 U/L — ABNORMAL LOW (ref 15–41)
Albumin: 3.4 g/dL — ABNORMAL LOW (ref 3.5–5.0)
Alkaline Phosphatase: 85 U/L (ref 38–126)
Anion gap: 8 (ref 5–15)
BUN: 11 mg/dL (ref 8–23)
CO2: 20 mmol/L — ABNORMAL LOW (ref 22–32)
Calcium: 8.4 mg/dL — ABNORMAL LOW (ref 8.9–10.3)
Chloride: 108 mmol/L (ref 98–111)
Creatinine, Ser: 0.65 mg/dL (ref 0.44–1.00)
GFR, Estimated: >60 mL/min (ref >60)
Glucose, Bld: 101 mg/dL — ABNORMAL HIGH (ref 70–99)
Potassium: 3.5 mmol/L (ref 3.5–5.1)
Sodium: 136 mmol/L (ref 135–145)
Total Bilirubin: 0.7 mg/dL (ref 0.3–1.2)
Total Protein: 7 g/dL (ref 6.5–8.1)

## 2022-02-08 LAB — MAGNESIUM
Magnesium: 1.8 mg/dL (ref 1.7–2.4)
Magnesium: 1.9 mg/dL (ref 1.7–2.4)

## 2022-02-08 LAB — BRAIN NATRIURETIC PEPTIDE: B Natriuretic Peptide: 354.2 pg/mL — ABNORMAL HIGH (ref 0.0–100.0)

## 2022-02-08 LAB — RESP PANEL BY RT-PCR (FLU A&B, COVID) ARPGX2
Influenza A by PCR: NEGATIVE
Influenza B by PCR: NEGATIVE
SARS Coronavirus 2 by RT PCR: NEGATIVE

## 2022-02-08 LAB — TROPONIN I (HIGH SENSITIVITY)
Troponin I (High Sensitivity): 43 ng/L — ABNORMAL HIGH (ref <18)
Troponin I (High Sensitivity): 44 ng/L — ABNORMAL HIGH (ref <18)

## 2022-02-08 LAB — PHOSPHORUS: Phosphorus: 2.8 mg/dL (ref 2.5–4.6)

## 2022-02-08 MED ORDER — ENOXAPARIN SODIUM 40 MG/0.4ML IJ SOSY: 40.0000 mg | PREFILLED_SYRINGE | INTRAMUSCULAR | Status: DC

## 2022-02-08 MED ORDER — DILTIAZEM HCL-DEXTROSE 125-5 MG/125ML-% IV SOLN (PREMIX)
5.0000 mg/h | INTRAVENOUS | Status: DC
  Administered 2022-02-08: 10 mg/h via INTRAVENOUS
  Administered 2022-02-08 – 2022-02-09 (×2): 5 mg/h via INTRAVENOUS
  Filled 2022-02-08 (×4): qty 125

## 2022-02-08 MED ORDER — ASPIRIN 81 MG PO CHEW
324.0000 mg | CHEWABLE_TABLET | Freq: Once | ORAL | Status: DC
Start: 2022-02-08 — End: 2022-02-10
  Filled 2022-02-08: qty 4

## 2022-02-08 MED ORDER — ENOXAPARIN SODIUM 60 MG/0.6ML IJ SOSY
0.5000 mg/kg | PREFILLED_SYRINGE | INTRAMUSCULAR | Status: DC
  Administered 2022-02-08: 52.5 mg via SUBCUTANEOUS
  Filled 2022-02-08: qty 0.6

## 2022-02-08 MED ORDER — ONDANSETRON HCL 4 MG/2ML IJ SOLN: 4.0000 mg | Freq: Four times a day (QID) | INTRAMUSCULAR | Status: DC | PRN

## 2022-02-08 MED ORDER — ACETAMINOPHEN 650 MG RE SUPP: 650.0000 mg | Freq: Four times a day (QID) | RECTAL | Status: DC | PRN

## 2022-02-08 MED ORDER — NITROGLYCERIN 0.4 MG SL SUBL: 0.4000 mg | SUBLINGUAL_TABLET | SUBLINGUAL | Status: DC | PRN

## 2022-02-08 MED ORDER — PANTOPRAZOLE SODIUM 40 MG PO TBEC
40.0000 mg | DELAYED_RELEASE_TABLET | Freq: Every day | ORAL | Status: DC
  Administered 2022-02-08 – 2022-02-10 (×3): 40 mg via ORAL
  Filled 2022-02-08 (×3): qty 1

## 2022-02-08 MED ORDER — ACETAMINOPHEN 325 MG PO TABS: 650.0000 mg | ORAL_TABLET | Freq: Four times a day (QID) | ORAL | Status: DC | PRN

## 2022-02-08 MED ORDER — DILTIAZEM LOAD VIA INFUSION
15.0000 mg | Freq: Once | INTRAVENOUS | Status: AC
  Administered 2022-02-08: 15 mg via INTRAVENOUS
  Filled 2022-02-08: qty 15

## 2022-02-08 MED ORDER — LEVOTHYROXINE SODIUM 100 MCG PO TABS
100.0000 ug | ORAL_TABLET | Freq: Every day | ORAL | Status: DC
  Administered 2022-02-09 – 2022-02-10 (×2): 100 ug via ORAL
  Filled 2022-02-08: qty 2
  Filled 2022-02-08: qty 1

## 2022-02-08 MED ORDER — ONDANSETRON HCL 4 MG PO TABS: 4.0000 mg | ORAL_TABLET | Freq: Four times a day (QID) | ORAL | Status: DC | PRN
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
## 2022-02-08 NOTE — Assessment & Plan Note (Signed)
-   We will continue to monitor - At bedside patient denies any chest pain or shortness of breath - We will put in EKG as needed for chest pain
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
## 2022-02-08 NOTE — ED Triage Notes (Signed)
Pt brought in via ems from home with chest pain and afib.    Sx began at 1600  ems gave 2 doses of cardizem.  Pt alert  iv in place.
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
## 2022-02-08 NOTE — ED Notes (Signed)
Cardizem drip increased to 10mg /hr.  Pt alert  afib on monitor.

## 2022-02-08 NOTE — Assessment & Plan Note (Signed)
-   Patient takes losartan 100 mg daily, metoprolol succinate 50 mg daily

## 2022-02-08 NOTE — Assessment & Plan Note (Signed)
-   Levothyroxine 100 mcg daily, 30 minutes before eating resumed

## 2022-02-08 NOTE — ED Notes (Signed)
Iv meds started  afib on monitor.  Pt alert.

## 2022-02-09 ENCOUNTER — Encounter: Payer: Self-pay | Admitting: Internal Medicine

## 2022-02-09 DIAGNOSIS — I480 Paroxysmal atrial fibrillation: Secondary | ICD-10-CM

## 2022-02-09 DIAGNOSIS — Z8249 Family history of ischemic heart disease and other diseases of the circulatory system: Secondary | ICD-10-CM | POA: Diagnosis not present

## 2022-02-09 DIAGNOSIS — Z6841 Body Mass Index (BMI) 40.0 and over, adult: Secondary | ICD-10-CM | POA: Diagnosis not present

## 2022-02-09 DIAGNOSIS — Z91048 Other nonmedicinal substance allergy status: Secondary | ICD-10-CM | POA: Diagnosis not present

## 2022-02-09 DIAGNOSIS — I4891 Unspecified atrial fibrillation: Secondary | ICD-10-CM | POA: Diagnosis present

## 2022-02-09 DIAGNOSIS — Z7982 Long term (current) use of aspirin: Secondary | ICD-10-CM | POA: Diagnosis not present

## 2022-02-09 DIAGNOSIS — E039 Hypothyroidism, unspecified: Secondary | ICD-10-CM | POA: Diagnosis present

## 2022-02-09 DIAGNOSIS — Z91018 Allergy to other foods: Secondary | ICD-10-CM | POA: Diagnosis not present

## 2022-02-09 DIAGNOSIS — Z87891 Personal history of nicotine dependence: Secondary | ICD-10-CM | POA: Diagnosis not present

## 2022-02-09 DIAGNOSIS — E782 Mixed hyperlipidemia: Secondary | ICD-10-CM | POA: Diagnosis present

## 2022-02-09 DIAGNOSIS — Z66 Do not resuscitate: Secondary | ICD-10-CM | POA: Diagnosis present

## 2022-02-09 DIAGNOSIS — K219 Gastro-esophageal reflux disease without esophagitis: Secondary | ICD-10-CM | POA: Diagnosis present

## 2022-02-09 DIAGNOSIS — I10 Essential (primary) hypertension: Secondary | ICD-10-CM

## 2022-02-09 DIAGNOSIS — I248 Other forms of acute ischemic heart disease: Secondary | ICD-10-CM

## 2022-02-09 DIAGNOSIS — Z20822 Contact with and (suspected) exposure to covid-19: Secondary | ICD-10-CM | POA: Diagnosis present

## 2022-02-09 DIAGNOSIS — Z9181 History of falling: Secondary | ICD-10-CM | POA: Diagnosis not present

## 2022-02-09 DIAGNOSIS — E669 Obesity, unspecified: Secondary | ICD-10-CM | POA: Diagnosis present

## 2022-02-09 DIAGNOSIS — Z79890 Hormone replacement therapy (postmenopausal): Secondary | ICD-10-CM | POA: Diagnosis not present

## 2022-02-09 DIAGNOSIS — R079 Chest pain, unspecified: Secondary | ICD-10-CM | POA: Diagnosis not present

## 2022-02-09 DIAGNOSIS — Z888 Allergy status to other drugs, medicaments and biological substances status: Secondary | ICD-10-CM | POA: Diagnosis not present

## 2022-02-09 DIAGNOSIS — Z79899 Other long term (current) drug therapy: Secondary | ICD-10-CM | POA: Diagnosis not present

## 2022-02-09 LAB — BASIC METABOLIC PANEL
Anion gap: 9 (ref 5–15)
BUN: 11 mg/dL (ref 8–23)
CO2: 22 mmol/L (ref 22–32)
Calcium: 9 mg/dL (ref 8.9–10.3)
Chloride: 109 mmol/L (ref 98–111)
Creatinine, Ser: 0.57 mg/dL (ref 0.44–1.00)
GFR, Estimated: >60 mL/min (ref >60)
Glucose, Bld: 95 mg/dL (ref 70–99)
Potassium: 3.6 mmol/L (ref 3.5–5.1)
Sodium: 140 mmol/L (ref 135–145)

## 2022-02-09 LAB — CBC
HCT: 39.6 % (ref 36.0–46.0)
Hemoglobin: 12.6 g/dL (ref 12.0–15.0)
MCH: 27.9 pg (ref 26.0–34.0)
MCHC: 31.8 g/dL (ref 30.0–36.0)
MCV: 87.8 fL (ref 80.0–100.0)
Platelets: 291 10*3/uL (ref 150–400)
RBC: 4.51 MIL/uL (ref 3.87–5.11)
RDW: 13.7 % (ref 11.5–15.5)
WBC: 10 10*3/uL (ref 4.0–10.5)
nRBC: 0 % (ref 0.0–0.2)

## 2022-02-09 LAB — PROTIME-INR
INR: 1.1 (ref 0.8–1.2)
Prothrombin Time: 13.9 seconds (ref 11.4–15.2)

## 2022-02-09 LAB — TSH: TSH: 1.125 u[IU]/mL (ref 0.350–4.500)

## 2022-02-09 LAB — TROPONIN I (HIGH SENSITIVITY): Troponin I (High Sensitivity): 44 ng/L — ABNORMAL HIGH (ref <18)

## 2022-02-09 MED ORDER — WARFARIN SODIUM 7.5 MG PO TABS
7.5000 mg | ORAL_TABLET | Freq: Once | ORAL | Status: AC
  Administered 2022-02-09: 7.5 mg via ORAL
  Filled 2022-02-09: qty 1

## 2022-02-09 MED ORDER — WARFARIN - PHARMACIST DOSING INPATIENT
Freq: Every day | Status: DC
Start: 2022-02-10 — End: 2022-02-10

## 2022-02-09 MED ORDER — METOPROLOL TARTRATE 5 MG/5ML IV SOLN: 5.0000 mg | INTRAVENOUS | Status: DC | PRN

## 2022-02-09 MED ORDER — METOPROLOL SUCCINATE ER 50 MG PO TB24
50.0000 mg | ORAL_TABLET | Freq: Every day | ORAL | Status: DC
  Administered 2022-02-09 – 2022-02-10 (×2): 50 mg via ORAL
  Filled 2022-02-09 (×2): qty 1

## 2022-02-09 MED ORDER — ENOXAPARIN SODIUM 100 MG/ML IJ SOSY
100.0000 mg | PREFILLED_SYRINGE | Freq: Two times a day (BID) | INTRAMUSCULAR | Status: DC
  Administered 2022-02-09 – 2022-02-10 (×2): 100 mg via SUBCUTANEOUS
  Filled 2022-02-09 (×4): qty 1

## 2022-02-09 NOTE — Assessment & Plan Note (Signed)
Home regimen is losartan 100 mg and Toprol 50 mg daily.  Not on hold.  Metoprolol continued. On Cardizem drip with stable blood pressures, intermittently soft.

## 2022-02-09 NOTE — Care Management Obs Status (Signed)
MEDICARE OBSERVATION STATUS NOTIFICATION   Patient Details  Name: Julie Phillips MRN: [id number] Date of Birth: 02-16-1932   Medicare Observation Status Notification Given:  Yes    NAUTIKA CRESSEY, RN 02/09/2022, 12:38 PM

## 2022-02-09 NOTE — Assessment & Plan Note (Signed)
Not on statin 

## 2022-02-09 NOTE — Hospital Course (Signed)
88 year old female with past medical history of paroxysmal A-fib not on anticoagulation due to concerns for recurrent falls [place] to be bleed risk, no A-fib recurrence for 13 years per most recent cardiology follow-up in January.  History of hypothyroid, hypertension who presented to the ED on 02/08/2022 with shortness of breath and chest pain.  She was admitted and started on Cardizem drip for A-fib with RVR.  Troponins were mildly elevated with flat trend, chest pain felt to be related to A-fib and demand ischemia and not ACS.  2/28: Resumed her home metoprolol and still on Cardizem drip with heart rate in the 120s.  Cardiology consulted.

## 2022-02-09 NOTE — Assessment & Plan Note (Signed)
Likely demand ischemia with A-fib RVR.  Chest pain improved with heart rate control.  Troponin trend was flat without acute ischemic changes on EKG.

## 2022-02-09 NOTE — TOC Initial Note (Signed)
Transition of Care [facility]) - Initial/Assessment Note    Patient Details  Name: Julie Phillips MRN: [id number] Date of Birth: [birth year]  Transition of Care [facility]) CM/SW Contact:    Shelbie Hutching, RN Phone Number: 02/09/2022, 12:53 PM  Clinical Narrative:                 MOON reviewed with patient at the bedside.  Patient lives alone at [place].  She walks with a cane or uses her rollator, she drives.  Patient is current with her PCP and Dr. Rockey Situ is her cardiologist.   Patient's daughter Orlinda Blalock will pick her up at discharge.    No TOC needs.   Expected Discharge Plan: Home/Self Care Barriers to Discharge: Continued Medical Work up   Patient Goals and CMS Choice Patient states their goals for this hospitalization and ongoing recovery are:: hopes to go home today      Expected Discharge Plan and Services Expected Discharge Plan: Home/Self Care       Living arrangements for the past 2 months: [place]                 DME Arranged: N/A DME Agency: NA       HH Arranged: NA [place] Agency: NA        Prior Living Arrangements/Services Living arrangements for the past 2 months: [place], [place] Lives with:: Self Patient language and need for interpreter reviewed:: Yes Do you feel safe going back to the place where you live?: Yes      Need for Family Participation in Patient Care: Yes (Comment) Care giver support system in place?: Yes (comment) (daughters) Current home services: DME (rollator, 2 canes, walker) Criminal Activity/Legal Involvement Pertinent to Current Situation/Hospitalization: No - Comment as needed  Activities of Daily Living      Permission Sought/Granted Permission sought to share information with : Case Manager, Family Supports Permission granted to share information with : Yes, Verbal Permission Granted  Share Information with NAME: Thurnell Garbe     Permission granted to  share info w Relationship: daughter     Emotional Assessment Appearance:: Appears stated age Attitude/Demeanor/Rapport: Engaged Affect (typically observed): Accepting Orientation: : Oriented to Self, Oriented to Place, Oriented to  Time, Oriented to Situation Alcohol / Substance Use: Not Applicable Psych Involvement: No (comment)  Admission diagnosis:  Atrial fibrillation with RVR (HCC) [I48.91] Patient Active Problem List   Diagnosis Date Noted   Atrial fibrillation with RVR ([place]) 02/08/2022   Elevated troponin 02/08/2022   Tremor of both hands 07/12/2021   GERD (gastroesophageal reflux disease) 07/18/2019   Hyperglycemia 07/09/2018   Vitamin D deficiency 07/09/2018   Sciatica of left side 27/74/1287   Umbilical abnormality 86/76/7209   Healthcare maintenance 06/28/2017   Toenail avulsion 06/28/2017   DNR (do not resuscitate) 06/27/2017   Band keratopathy 01/16/2017   Risk for falls 06/21/2016   Rotator cuff disorder 12/10/2015   SOB (shortness of breath) 12/10/2015   Osteopenia 02/13/2015   Advance care planning 01/15/2015   Aspiration into airway 10/08/2013   Food allergy 07/09/2013   Medicare annual wellness visit, subsequent 12/26/2012   Knee pain 07/20/2012   Rash 04/11/2012   Hypothyroid 04/11/2012   LVH (left ventricular hypertrophy) 01/17/2012   AF (atrial fibrillation) ([place]) 01/06/2012   Obesity 01/06/2012   Essential hypertension 01/06/2012   Mixed hyperlipidemia 01/06/2012   PCP:  Tonia Ghent, MD Pharmacy:   [facility]  [place] [address] Phone: [phone] Fax: [place], [place] [address] Phone: [phone] Fax: [place] [address], [place] - [address] [address] [address] [address] [place] [address] Phone: [phone] Fax:  [phone]     Social Determinants of Health (SDOH) Interventions    Readmission Risk Interventions No flowsheet data found.

## 2022-02-09 NOTE — ED Notes (Addendum)
Per request of floor this RN went to place a second IV. Pt stated "absolutely not. I am so close to going home I will check myself out before we put another IV in me."
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
## 2022-02-09 NOTE — Assessment & Plan Note (Signed)
Continue Synthroid.  TSH normal in October, will check a repeat TSH.

## 2022-02-09 NOTE — Consult Note (Signed)
ANTICOAGULATION CONSULT NOTE - Initial Consult  Pharmacy Consult for Warfarin Indication: atrial fibrillation  Allergies  Allergen Reactions   Statins     Myalgias   Wheat     Throat swelling   Amlodipine     hematuria   Eucalyptus Oil    Lisinopril     Unrecalled BP med- possibly lisinopril- intolerant, had cough on the medicine- changed to cozaar    Patient Measurements: Height: 5\' 1"  (154.9 cm) Weight: 104.3 kg (230 lb) IBW/kg (Calculated) : 47.8 Heparin Dosing Weight: 73.1kg  Vital Signs: Temp: 98.2 F (36.8 C) (02/28 1507) BP: 145/92 (02/28 1507) Pulse Rate: 114 (02/28 1507)  Labs: Recent Labs    02/08/22 1719 02/08/22 1911 02/08/22 2325 02/09/22 0434 02/09/22 1735  HGB 12.6  --   --  12.6  --   HCT 38.6  --   --  39.6  --   PLT 265  --   --  291  --   LABPROT  --   --   --   --  13.9  INR  --   --   --   --  1.1  CREATININE 0.65  --   --  0.57  --   TROPONINIHS 43* 44* 44*  --   --     Estimated Creatinine Clearance: 55.1 mL/min (by C-G formula based on SCr of 0.57 mg/dL).   Medical History: Past Medical History:  Diagnosis Date   AF (atrial fibrillation) ([place])    Fall    GERD (gastroesophageal reflux disease)    History of chicken pox    Hx of colonic polyps    Hyperlipidemia    Hypertension    Hypothyroidism    Keratopathy 2018   R eye   LVH (left ventricular hypertrophy)     Medications:  PTA: Previously on coumadin that was reduced then stopped in 2014 due to recurrent falls PTA regimen: 6mg  MWF and 3mg  rest of the week (2014)  Assessment: Julie Phillips is a 88 y.o. female with a hx of PAF diagnosed in 2007 not on [place] due to frequent falls, HTN, HLD, hypothyroidism, and obesity who is being seen today for the evaluation of Afib at the request of Dr. Arbutus Ped. Pharmacy consulted for warfarin and enoxaparin management in the setting of atrial fibrillation.  Goal of Therapy:  INR 2-3 Monitor platelets by anticoagulation protocol:  Yes   Plan:  Give warfarin 7.5mg  once  Start enoxaparin 100 mg q12 hours for at least 5 days and until 2 consecutive INR levels WNL Continue to monitor INR daily and CBC weekly with AM labs   Darrick Penna 02/09/2022,6:05 PM
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
## 2022-02-09 NOTE — Assessment & Plan Note (Signed)
Noted  

## 2022-02-09 NOTE — Assessment & Plan Note (Signed)
Continue PPI ?

## 2022-02-09 NOTE — Assessment & Plan Note (Signed)
Presented with shortness of breath and chest discomfort.  EKG with A-fib RVR with a rate of 120.  No acute ischemic changes on EKG.  Mild associated demand ischemia with troponin 43. Patient follows with Dr. Rockey Situ.  2/28: Still on Cardizem drip with home metoprolol resumed.  Heart rate still in 120s.  -- Continue Cardizem drip -- Continue home metoprolol -- Consult cardiology -- Monitor telemetry -- Okay to go to [facility] with telemetry once off Cardizem drip if progressive bed not available

## 2022-02-10 ENCOUNTER — Inpatient Hospital Stay (HOSPITAL_COMMUNITY)
Admit: 2022-02-10 | Discharge: 2022-02-10 | Disposition: A | Discharge status: Home / Self Care | Payer: PPO | Attending: Physician Assistant | Admitting: Physician Assistant

## 2022-02-10 ENCOUNTER — Telehealth: Payer: Self-pay | Admitting: Cardiovascular Disease

## 2022-02-10 ENCOUNTER — Encounter: Payer: Self-pay | Admitting: Internal Medicine

## 2022-02-10 DIAGNOSIS — R079 Chest pain, unspecified: Secondary | ICD-10-CM

## 2022-02-10 LAB — ECHOCARDIOGRAM COMPLETE
AR max vel: 3.15 cm2
AV Area VTI: 3.29 cm2
AV Area mean vel: 2.94 cm2
AV Mean grad: 3 mmHg
AV Peak grad: 5.1 mmHg
Ao pk vel: 1.13 m/s
Area-P 1/2: 3.24 cm2
Height: 61 in
MV VTI: 1.16 cm2
S' Lateral: 3.1 cm
Weight: 3680 oz

## 2022-02-10 LAB — PROTIME-INR
INR: 1.1 (ref 0.8–1.2)
Prothrombin Time: 14 seconds (ref 11.4–15.2)

## 2022-02-10 LAB — BASIC METABOLIC PANEL
Anion gap: 8 (ref 5–15)
BUN: 22 mg/dL (ref 8–23)
CO2: 25 mmol/L (ref 22–32)
Calcium: 8.9 mg/dL (ref 8.9–10.3)
Chloride: 103 mmol/L (ref 98–111)
Creatinine, Ser: 0.78 mg/dL (ref 0.44–1.00)
GFR, Estimated: >60 mL/min (ref >60)
Glucose, Bld: 98 mg/dL (ref 70–99)
Potassium: 3.6 mmol/L (ref 3.5–5.1)
Sodium: 136 mmol/L (ref 135–145)

## 2022-02-10 LAB — MAGNESIUM: Magnesium: 2 mg/dL (ref 1.7–2.4)

## 2022-02-10 MED ORDER — WARFARIN SODIUM 7.5 MG PO TABS: 7.5000 mg | ORAL_TABLET | Freq: Once | ORAL | 0 refills | Status: DC

## 2022-02-10 MED ORDER — ENOXAPARIN SODIUM 100 MG/ML IJ SOSY: 100.0000 mg | PREFILLED_SYRINGE | Freq: Two times a day (BID) | INTRAMUSCULAR | 0 refills | Status: DC

## 2022-02-10 MED ORDER — VITAMIN B-12 1000 MCG PO TABS: 1000.0000 ug | ORAL_TABLET | Freq: Every day | ORAL | Status: DC

## 2022-02-10 MED ORDER — DILTIAZEM HCL ER COATED BEADS 120 MG PO CP24
120.0000 mg | ORAL_CAPSULE | Freq: Every day | ORAL | 0 refills | Status: DC
Start: 2022-02-11 — End: 2022-03-01

## 2022-02-10 MED ORDER — WARFARIN SODIUM 7.5 MG PO TABS
7.5000 mg | ORAL_TABLET | Freq: Once | ORAL | Status: DC
  Filled 2022-02-10: qty 1

## 2022-02-10 MED ORDER — PERFLUTREN LIPID MICROSPHERE
1.0000 mL | INTRAVENOUS | Status: AC | PRN
  Administered 2022-02-10: 3 mL via INTRAVENOUS
  Filled 2022-02-10: qty 10

## 2022-02-10 MED ORDER — DILTIAZEM HCL ER COATED BEADS 120 MG PO CP24
120.0000 mg | ORAL_CAPSULE | Freq: Every day | ORAL | Status: DC
  Administered 2022-02-10: 120 mg via ORAL
  Filled 2022-02-10: qty 1

## 2022-02-10 NOTE — Telephone Encounter (Signed)
Patient calling  ?Patient is in the hospital and states she has had two conflicting meetings with our cardiologists  ?Would like to clarify  ?Please call to discuss  ?

## 2022-02-10 NOTE — Progress Notes (Signed)
*  PRELIMINARY RESULTS* ?Echocardiogram ?2D Echocardiogram has been performed. ? ?Julie Phillips ?02/10/2022, 11:22 AM ?

## 2022-02-10 NOTE — Progress Notes (Signed)
? ? ?Progress Note ? ?Patient Name: Julie Phillips ?Date of Encounter: 02/10/2022 ? ?Primary Cardiologist: Rockey Situ ? ?Subjective  ? ?She converted to sinus rhythm at 15:21 on 2/28, and has maintained sinus rhythm since. With this, she reports feeling an improvement in chest fullness. Currently, without angina, dyspnea, or palpitations.  ? ?Inpatient Medications  ?  ?Scheduled Meds: ? aspirin  324 mg Oral Once  ? enoxaparin (LOVENOX) injection  100 mg Subcutaneous Q12H  ? levothyroxine  100 mcg Oral Q0600  ? metoprolol succinate  50 mg Oral Daily  ? pantoprazole  40 mg Oral Daily  ? Warfarin - Pharmacist Dosing Inpatient   Does not apply [id number]  ? ?Continuous Infusions: ? diltiazem (CARDIZEM) infusion Stopped (02/10/22 0405)  ? ?PRN Meds: ?acetaminophen **OR** acetaminophen, metoprolol tartrate, nitroGLYCERIN, ondansetron **OR** ondansetron (ZOFRAN) IV  ? ?Vital Signs  ?  ?Vitals:  ? 02/09/22 1958 02/09/22 2341 02/10/22 0400 02/10/22 0737  ?BP: (!) 122/43 (!) 136/52 (!) 137/49 (!) 159/61  ?Pulse: 68 72 62 73  ?Resp: 20 20 18 18   ?Temp: 97.7 ?F (36.5 ?C) 98.2 ?F (36.8 ?C) 97.7 ?F (36.5 ?C) 98 ?F (36.7 ?C)  ?TempSrc: Oral Oral Oral   ?SpO2: 94% 97% 95% 97%  ?Weight:      ?Height:      ? ? ?Intake/Output Summary (Last 24 hours) at 02/10/2022 0818 ?Last data filed at 02/10/2022 0645 ?Gross per 24 hour  ?Intake 265.9 ml  ?Output 600 ml  ?Net -334.1 ml  ? ?Filed Weights  ? 02/08/22 1716  ?Weight: 104.3 kg  ? ? ?Telemetry  ?  ?Converted to sinus rhythm at 15:21 on 2/28 and has maintained sinus since - Personally Reviewed ? ?ECG  ?  ?No new tracings - Personally Reviewed ? ?Physical Exam  ? ?GEN: No acute distress.   ?Neck: No JVD. ?Cardiac: RRR, I/VI systolic murmur, no rubs, or gallops.  ?Respiratory: Clear to auscultation bilaterally.  ?GI: Soft, nontender, non-distended.   ?MS: No edema; No deformity. ?Neuro:  Alert and oriented x 3; Nonfocal.  ?Psych: Normal affect. ? ?Labs  ?  ?Chemistry ?Recent Labs  ?Lab 02/08/22 ?1719  02/09/22 ?0434  ?NA 136 140  ?K 3.5 3.6  ?CL 108 109  ?CO2 20* 22  ?GLUCOSE 101* 95  ?BUN 11 11  ?CREATININE 0.65 0.57  ?CALCIUM 8.4* 9.0  ?PROT 7.0  --   ?ALBUMIN 3.4*  --   ?AST 14*  --   ?ALT 8  --   ?ALKPHOS 85  --   ?BILITOT 0.7  --   ?GFRNONAA >60 >60  ?ANIONGAP 8 9  ?  ? ?Hematology ?Recent Labs  ?Lab 02/08/22 ?1719 02/09/22 ?0434  ?WBC 11.1* 10.0  ?RBC 4.42 4.51  ?HGB 12.6 12.6  ?HCT 38.6 39.6  ?MCV 87.3 87.8  ?MCH 28.5 27.9  ?MCHC 32.6 31.8  ?RDW 13.5 13.7  ?PLT 265 291  ? ? ?Cardiac EnzymesNo results for input(s): TROPONINI in the last 168 hours. No results for input(s): TROPIPOC in the last 168 hours.  ? ?BNP ?Recent Labs  ?Lab 02/08/22 ?1719  ?BNP 354.2*  ?  ? ?DDimer No results for input(s): DDIMER in the last 168 hours.  ? ?Radiology  ?  ?DG Chest Port 1 View ? ?Result Date: 02/08/2022 ?IMPRESSION: No active disease. Electronically Signed   By: Marijo Conception M.D.   On: 02/08/2022 17:51   ? ?Cardiac Studies  ? ?Nuclear stress test 12/2005: ?No evidence of ischemia ?__________ ?  ?  2D echo 12/2005: ?Normal LV systolic function, mild concentric LVH, mild to moderate left atrial enlargement ?__________ ? ?2D echo pending ? ?Patient Profile  ?   ?88 y.o. female with history of PAF diagnosed in 2007 not on [place] due to frequent falls, HTN, HLD, hypothyroidism, and obesity who is being seen today for the evaluation of Afib at the request of Dr. Arbutus Ped. ? ?Assessment & Plan  ?  ?1. PAF with RVR: ?-She converted to sinus rhythm at 15:21 on 2/28, and has maintained sinus rhythm since ?-Diltiazem gtt stopped ?-Continue Toprol-XL 50 mg, heart rates preclude further titration at this time ?-CHA2DS2-VASc at least 4 (HTN, age x2, sex category) ?-Since undergoing balance class, she has significantly improved her fall risk and indicates she has not fallen in at least the past 18 months ?-Given it appears she has had at least 1, possibly 2 recurrent episodes of A-fib, and in the context of improved fall risk with an  underlying elevated CHA2DS2-VASc we will pursue long-term [place] ?-She does have concerns regarding the affordability of DOAC, therefore she preferred to start warfarin, which was started on the evening of 2/28 ?-Remains on a Lovenox to warfarin bridge, doubt she needs to remain admitted for therapeutic INR ?-Will need to establish with our Coumadin Clinic  ?-Echo pending ?-TSH normal ?-Replete magnesium and potassium to goal 2.0 and 4.0 respectively, await updated labs ?  ?2.  Chest pressure with mildly elevated high-sensitivity troponin: ?-Symptoms of chest pressure may have been in the setting of A-fib with RVR, though cannot fully exclude ischemic heart disease ?-Mildly elevated high-sensitivity troponin is flat trending and not consistent with ACS ?-Obtain echo as outlined above, if this is reassuring, would recommend pursuing outpatient ischemic evaluation, potentially with coronary CTA (as long as she is maintaining sinus rhythm) ?-She will be initiated on warfarin as outlined above, would not co-administer ASA once INR is therapeutic in an effort to minimize bleeding risk ?  ?3.  HTN: ?-Blood pressure reasonably controlled ?-Continue therapy as outlined above ? ?   ? ?For questions or updates, please contact [place] ?Please consult [email or web address] for contact info under Cardiology/STEMI. ?  ? ?Signed, ?Christell Faith, PA-C ?[facility] ?Pager: [phone] ?02/10/2022, 8:18 AM ? ?

## 2022-02-10 NOTE — Discharge Summary (Signed)
Physician Discharge Summary  Julie Phillips ZOX:[id number] DOB: February 25, 1932 DOA: 02/08/2022  PCP: Tonia Ghent, MD  Admit date: 02/08/2022 Discharge date: 02/10/2022  Discharge disposition: Home   Recommendations for Outpatient Follow-Up:   Follow-up with PCP and cardiologist in 1 week Check INR on 02/12/2022 at the Coumadin clinic   Discharge Diagnosis:   Principal Problem:   Atrial fibrillation with RVR ([place]) Active Problems:   AF (atrial fibrillation) ([place])   Essential hypertension   Mixed hyperlipidemia   Hypothyroid   DNR (do not resuscitate)   GERD (gastroesophageal reflux disease)   Elevated troponin    Discharge Condition: Stable.  Diet recommendation:  Diet Order             Diet - low sodium heart healthy           Diet Heart Room service appropriate? Yes; Fluid consistency: Thin  Diet effective now                     Code Status: DNR     Hospital Course:   [name] is an 88 year old woman with medical history significant for paroxysmal atrial fibrillation, hypothyroidism, hypertension, who presented to the hospital because of shortness of breath and chest pain.    She was found to have atrial fibrillation with RVR.  She was treated with IV Cardizem drip and oral metoprolol.  Cardiologist was consulted to assist with management.  She was started on warfarin for stroke prophylaxis with full dose Lovenox as a bridge to anticoagulation.  Her troponins were mildly elevated but flat on admission and this was attributed to demand ischemia.  Her condition has improved and she is deemed stable for discharge to home.    Medical Consultants:   Cardiologist   Discharge Exam:    Vitals:   02/09/22 2341 02/10/22 0400 02/10/22 0737 02/10/22 1120  BP: (!) 136/52 (!) 137/49 (!) 159/61 (!) 151/78  Pulse: 72 62 73 63  Resp: 20 18 18 18   Temp: 98.2 F (36.8 C) 97.7 F (36.5 C) 98 F (36.7 C) 97.8 F (36.6 C)  TempSrc: Oral Oral    SpO2:  97% 95% 97% 98%  Weight:      Height:         GEN: NAD SKIN: Warm and dry EYES: No pallor or icterus ENT: MMM CV: RRR PULM: CTA B ABD: soft, obese, NT, +BS CNS: AAO x 3, non focal EXT: No edema or tenderness   The results of significant diagnostics from this hospitalization (including imaging, microbiology, ancillary and laboratory) are listed below for reference.     Procedures and Diagnostic Studies:   ECHOCARDIOGRAM COMPLETE  Result Date: 02/10/2022    ECHOCARDIOGRAM REPORT   Patient Name:   Julie Phillips Date of Exam: 02/10/2022 Medical Rec #:  [id number]      Height:       61.0 in Accession #:    [id number]     Weight:       230.0 lb Date of Birth:  [birth year]     BSA:          2.004 m Patient Age:    88 years       BP:           159/61 mmHg Patient Gender: F              HR:           61 bpm. Exam Location:  [place]  Procedure: 2D Echo, Color Doppler, Cardiac Doppler and Intracardiac            Opacification Agent Indications:     R07.9 Chest Pain  History:         Patient has no prior history of Echocardiogram examinations.                  LVH, Arrythmias:Atrial Fibrillation; Risk Factors:Hypertension                  and Dyslipidemia.  Sonographer:     Charmayne Sheer Referring Phys:  [id number] Rise Mu Diagnosing Phys: Kate Sable MD  Sonographer Comments: Suboptimal apical window and suboptimal subcostal window. Image acquisition challenging due to patient body habitus. IMPRESSIONS  1. Left ventricular ejection fraction, by estimation, is 65 to 70%. The left ventricle has normal function. The left ventricle has no regional wall motion abnormalities. There is mild left ventricular hypertrophy. Left ventricular diastolic parameters are consistent with Grade II diastolic dysfunction (pseudonormalization).  2. Right ventricular systolic function is normal. The right ventricular size is normal.  3. Left atrial size was mildly dilated.  4. The mitral valve is degenerative. Mild mitral valve  regurgitation. Moderate mitral annular calcification.  5. The aortic valve was not well visualized. Aortic valve regurgitation is not visualized. Aortic valve sclerosis/calcification is present, without any evidence of aortic stenosis.  6. The inferior vena cava is normal in size with greater than 50% respiratory variability, suggesting right atrial pressure of 3 mmHg. FINDINGS  Left Ventricle: Left ventricular ejection fraction, by estimation, is 65 to 70%. The left ventricle has normal function. The left ventricle has no regional wall motion abnormalities. Definity contrast agent was given IV to delineate the left ventricular  endocardial borders. The left ventricular internal cavity size was normal in size. There is mild left ventricular hypertrophy. Left ventricular diastolic parameters are consistent with Grade II diastolic dysfunction (pseudonormalization). Right Ventricle: The right ventricular size is normal. No increase in right ventricular wall thickness. Right ventricular systolic function is normal. Left Atrium: Left atrial size was mildly dilated. Right Atrium: Right atrial size was normal in size. Pericardium: There is no evidence of pericardial effusion. Mitral Valve: The mitral valve is degenerative in appearance. Moderate mitral annular calcification. Mild mitral valve regurgitation. MV peak gradient, 8.5 mmHg. The mean mitral valve gradient is 3.0 mmHg. Tricuspid Valve: The tricuspid valve is normal in structure. Tricuspid valve regurgitation is mild. Aortic Valve: The aortic valve was not well visualized. Aortic valve regurgitation is not visualized. Aortic valve sclerosis/calcification is present, without any evidence of aortic stenosis. Aortic valve mean gradient measures 3.0 mmHg. Aortic valve peak gradient measures 5.1 mmHg. Aortic valve area, by VTI measures 3.29 cm. Pulmonic Valve: The pulmonic valve was not well visualized. Pulmonic valve regurgitation is mild. Aorta: The aortic root and  ascending aorta are structurally normal, with no evidence of dilitation. Venous: The inferior vena cava is normal in size with greater than 50% respiratory variability, suggesting right atrial pressure of 3 mmHg. IAS/Shunts: The interatrial septum was not well visualized.  LEFT VENTRICLE PLAX 2D LVIDd:         4.34 cm   Diastology LVIDs:         3.10 cm   LV e' medial:    6.31 cm/s LV PW:         1.07 cm   LV E/e' medial:  22.8 LV IVS:        1.05 cm  LV e' lateral:   9.25 cm/s LVOT diam:     2.50 cm   LV E/e' lateral: 15.6 LV SV:         58 LV SV Index:   29 LVOT Area:     4.91 cm  LEFT ATRIUM         Index LA diam:    4.30 cm 2.15 cm/m  AORTIC VALVE                    PULMONIC VALVE AV Area (Vmax):    3.15 cm     PV Vmax:          0.95 m/s AV Area (Vmean):   2.94 cm     PV Vmean:         68.300 cm/s AV Area (VTI):     3.29 cm     PV VTI:           0.188 m AV Vmax:           113.00 cm/s  PV Peak grad:     3.6 mmHg AV Vmean:          76.900 cm/s  PV Mean grad:     2.0 mmHg AV VTI:            0.176 m      PR End Diast Vel: 2.12 msec AV Peak Grad:      5.1 mmHg AV Mean Grad:      3.0 mmHg LVOT Vmax:         72.50 cm/s LVOT Vmean:        46.000 cm/s LVOT VTI:          0.118 m LVOT/AV VTI ratio: 0.67  AORTA Ao Root diam: 3.00 cm MITRAL VALVE                TRICUSPID VALVE MV Area (PHT): 3.24 cm     TR Peak grad:   24.6 mmHg MV Area VTI:   1.16 cm     TR Vmax:        248.00 cm/s MV Peak grad:  8.5 mmHg MV Mean grad:  3.0 mmHg     SHUNTS MV Vmax:       1.46 m/s     Systemic VTI:  0.12 m MV Vmean:      85.8 cm/s    Systemic Diam: 2.50 cm MV Decel Time: 234 msec MV E velocity: 144.00 cm/s MV A velocity: 93.50 cm/s MV E/A ratio:  1.54 Kate Sable MD Electronically signed by Kate Sable MD Signature Date/Time: 02/10/2022/12:59:08 PM    Final      Labs:   Basic Metabolic Panel: Recent Labs  Lab 02/08/22 1719 02/08/22 1911 02/09/22 0434 02/10/22 0615  NA 136  --  140 136  K 3.5  --  3.6 3.6  CL  108  --  109 103  CO2 20*  --  22 25  GLUCOSE 101*  --  95 98  BUN 11  --  11 22  CREATININE 0.65  --  0.57 0.78  CALCIUM 8.4*  --  9.0 8.9  MG 1.8 1.9  --  2.0  PHOS  --  2.8  --   --    GFR Estimated Creatinine Clearance: 55.1 mL/min (by C-G formula based on SCr of 0.78 mg/dL). Liver Function Tests: Recent Labs  Lab 02/08/22 1719  AST 14*  ALT 8  ALKPHOS 85  BILITOT 0.7  PROT  7.0  ALBUMIN 3.4*   No results for input(s): LIPASE, AMYLASE in the last 168 hours. No results for input(s): AMMONIA in the last 168 hours. Coagulation profile Recent Labs  Lab 02/09/22 1735 02/10/22 0615  INR 1.1 1.1    CBC: Recent Labs  Lab 02/08/22 1719 02/09/22 0434  WBC 11.1* 10.0  NEUTROABS 8.1*  --   HGB 12.6 12.6  HCT 38.6 39.6  MCV 87.3 87.8  PLT 265 291   Cardiac Enzymes: No results for input(s): CKTOTAL, CKMB, CKMBINDEX, TROPONINI in the last 168 hours. BNP: Invalid input(s): POCBNP CBG: No results for input(s): GLUCAP in the last 168 hours. D-Dimer No results for input(s): DDIMER in the last 72 hours. Hgb A1c No results for input(s): HGBA1C in the last 72 hours. Lipid Profile No results for input(s): CHOL, HDL, LDLCALC, TRIG, CHOLHDL, LDLDIRECT in the last 72 hours. Thyroid function studies Recent Labs    02/09/22 0434  TSH 1.125   Anemia work up No results for input(s): VITAMINB12, FOLATE, FERRITIN, TIBC, IRON, RETICCTPCT in the last 72 hours. Microbiology Recent Results (from the past 240 hour(s))  Resp Panel by RT-PCR (Flu A&B, Covid) Nasopharyngeal Swab     Status: None   Collection Time: 02/08/22  5:19 PM   Specimen: Nasopharyngeal Swab; Nasopharyngeal(NP) swabs in vial transport medium  Result Value Ref Range Status   SARS Coronavirus 2 by RT PCR NEGATIVE NEGATIVE Final    Comment: (NOTE) SARS-CoV-2 target nucleic acids are NOT DETECTED.  The SARS-CoV-2 RNA is generally detectable in upper respiratory specimens during the acute phase of infection. The  lowest concentration of SARS-CoV-2 viral copies this assay can detect is 138 copies/mL. A negative result does not preclude SARS-Cov-2 infection and should not be used as the sole basis for treatment or other patient management decisions. A negative result may occur with  improper specimen collection/handling, submission of specimen other than nasopharyngeal swab, presence of viral mutation(s) within the areas targeted by this assay, and inadequate number of viral copies(<138 copies/mL). A negative result must be combined with clinical observations, patient history, and epidemiological information. The expected result is Negative.  Fact Sheet for Patients:  EntrepreneurPulse.com.au  Fact Sheet for Healthcare Providers:  IncredibleEmployment.be  This test is no t yet approved or cleared by the [place] FDA and  has been authorized for detection and/or diagnosis of SARS-CoV-2 by FDA under an Emergency Use Authorization (EUA). This EUA will remain  in effect (meaning this test can be used) for the duration of the COVID-19 declaration under Section 564(b)(1) of the Act, 21 U.S.C.section 360bbb-3(b)(1), unless the authorization is terminated  or revoked sooner.       Influenza A by PCR NEGATIVE NEGATIVE Final   Influenza B by PCR NEGATIVE NEGATIVE Final    Comment: (NOTE) The Xpert Xpress SARS-CoV-2/FLU/RSV plus assay is intended as an aid in the diagnosis of influenza from Nasopharyngeal swab specimens and should not be used as a sole basis for treatment. Nasal washings and aspirates are unacceptable for Xpert Xpress SARS-CoV-2/FLU/RSV testing.  Fact Sheet for Patients: EntrepreneurPulse.com.au  Fact Sheet for Healthcare Providers: IncredibleEmployment.be  This test is not yet approved or cleared by the [place] FDA and has been authorized for detection and/or diagnosis of SARS-CoV-2 by FDA under  an Emergency Use Authorization (EUA). This EUA will remain in effect (meaning this test can be used) for the duration of the COVID-19 declaration under Section 564(b)(1) of the Act, 21 U.S.C. section 360bbb-3(b)(1), unless the authorization  is terminated or revoked.  Performed at [facility], [address] [place], [place] [address]      Discharge Instructions:   Discharge Instructions     Diet - low sodium heart healthy   Complete by: As directed    Discharge instructions   Complete by: As directed    Check INR on 02/12/2022 and follow with your cardiologist or PCP's instructions regarding Coumadin dose and continuation of Lovenox   Increase activity slowly   Complete by: As directed       Allergies as of 02/10/2022       Reactions   Statins    Myalgias   Wheat    Throat swelling   Amlodipine    hematuria   Eucalyptus Oil    Lisinopril    Unrecalled BP med- possibly lisinopril- intolerant, had cough on the medicine- changed to cozaar        Medication List     STOP taking these medications    aspirin EC 81 MG tablet       TAKE these medications    diltiazem 120 MG 24 hr capsule Commonly known as: CARDIZEM CD Take 1 capsule (120 mg total) by mouth daily. Start taking on: February 11, 2022   diphenhydrAMINE 25 mg capsule Commonly known as: Benadryl Take 1 capsule (25 mg total) by mouth daily as needed.   enoxaparin 100 MG/ML injection Commonly known as: LOVENOX Inject 1 mL (100 mg total) into the skin every 12 (twelve) hours for 5 days.   gabapentin 100 MG capsule Commonly known as: NEURONTIN TAKE 1 CAPSULE BY MOUTH 3 TIMES DAILY ASNEEDED What changed: See the new instructions.   levothyroxine 100 MCG tablet Commonly known as: SYNTHROID TAKE 1 TABLET BY MOUTH ONCE A DAY BEFOREBREAKFAST.   loratadine 10 MG tablet Commonly known as: CLARITIN Take 1 tablet (10 mg total) by mouth daily as needed.   losartan 100 MG tablet Commonly known  as: COZAAR Take 1 tablet (100 mg total) by mouth daily.   metoprolol succinate 50 MG 24 hr tablet Commonly known as: TOPROL-XL Take with or immediately following a meal.   nitroGLYCERIN 0.4 MG SL tablet Commonly known as: NITROSTAT Place 1 tablet (0.4 mg total) under the tongue every 5 (five) minutes as needed for chest pain.   omeprazole 20 MG capsule Commonly known as: PRILOSEC Take 1 capsule (20 mg total) by mouth daily.   Polyethyl Glycol-Propyl Glycol 0.4-0.3 % Gel ophthalmic gel Commonly known as: SYSTANE Place 1 application into both eyes.   potassium chloride 10 MEQ tablet Commonly known as: KLOR-CON Take 1 tablet (10 mEq total) by mouth daily.   vitamin B-12 1000 MCG tablet Commonly known as: CYANOCOBALAMIN Take 1 tablet (1,000 mcg total) by mouth daily. What changed:  medication strength how much to take   warfarin 7.5 MG tablet Commonly known as: COUMADIN Take 1 tablet (7.5 mg total) by mouth one time only at 4 PM.           If you experience worsening of your admission symptoms, develop shortness of breath, life threatening emergency, suicidal or homicidal thoughts you must seek medical attention immediately by calling 911 or calling your MD immediately  if symptoms less severe.   You must read complete instructions/literature along with all the possible adverse reactions/side effects for all the medicines you take and that have been prescribed to you. Take any new medicines after you have completely understood and accept all the possible adverse reactions/side  effects.    Please note   You were cared for by a hospitalist during your hospital stay. If you have any questions about your discharge medications or the care you received while you were in the hospital after you are discharged, you can call the unit and asked to speak with the hospitalist on call if the hospitalist that took care of you is not available. Once you are discharged, your primary care  physician will handle any further medical issues. Please note that NO REFILLS for any discharge medications will be authorized once you are discharged, as it is imperative that you return to your primary care physician (or establish a relationship with a primary care physician if you do not have one) for your aftercare needs so that they can reassess your need for medications and monitor your lab values.       Time coordinating discharge: Greater than 30 minutes  Signed:  Brenly Trawick  [facility] 02/10/2022, 1:54 PM   Pager on [email or web address]. If 7PM-7AM, please contact night-coverage at [email or web address]

## 2022-02-10 NOTE — Telephone Encounter (Signed)
Return call to pt, she is inpatient, currently getting ECHO, pt reports concern for "when I will be discharge, I have two different people telling me differently"  ? ?Advised pt cannot determine when she will leave the hospital, she is inpatient, that will be decided by the inpatient team. Also, informed pt she is under the care of the hospital, currently her POC and treatment will need to be determined by them, they can also consult the cardiologist team on call if need be,  if she has any concerns with medications, testing, or POC, she may make a f/u appt to be seen in office to discuss.  ? ?Otherwise all questions were address and no additional concerns at this time. Julie Phillips thankful for the return call and advice.  ?

## 2022-02-10 NOTE — Consult Note (Addendum)
ANTICOAGULATION CONSULT NOTE ? ?Pharmacy Consult for Warfarin ?Indication: atrial fibrillation ? ?Allergies  ?Allergen Reactions  ? Statins   ?  Myalgias  ? Wheat   ?  Throat swelling  ? Amlodipine   ?  hematuria  ? Eucalyptus Oil   ? Lisinopril   ?  Unrecalled BP med- possibly lisinopril- intolerant, had cough on the medicine- changed to cozaar  ? ? ?Patient Measurements: ?Height: 5\' 1"  (154.9 cm) ?Weight: 104.3 kg (230 lb) ?IBW/kg (Calculated) : 47.8 ?Heparin Dosing Weight: 73.1kg ? ?Vital Signs: ?Temp: 98 ?F (36.7 ?C) (03/01 3875) ?Temp Source: Oral (03/01 0400) ?BP: 159/61 (03/01 0737) ?Pulse Rate: 73 (03/01 0737) ? ?Labs: ?Recent Labs  ?  02/08/22 ?1719 02/08/22 ?1911 02/08/22 ?2325 02/09/22 ?0434 02/09/22 ?1735 02/10/22 ?0615  ?HGB 12.6  --   --  12.6  --   --   ?HCT 38.6  --   --  39.6  --   --   ?PLT 265  --   --  291  --   --   ?LABPROT  --   --   --   --  13.9 14.0  ?INR  --   --   --   --  1.1 1.1  ?CREATININE 0.65  --   --  0.57  --   --   ?TROPONINIHS 43* 44* 44*  --   --   --   ? ? ? ?Estimated Creatinine Clearance: 55.1 mL/min (by C-G formula based on SCr of 0.57 mg/dL). ? ? ?Medical History: ?Past Medical History:  ?Diagnosis Date  ? AF (atrial fibrillation) ([place])   ? Fall   ? GERD (gastroesophageal reflux disease)   ? History of chicken pox   ? Hx of colonic polyps   ? Hyperlipidemia   ? Hypertension   ? Hypothyroidism   ? Keratopathy 2018  ? R eye  ? LVH (left ventricular hypertrophy)   ? ? ?Medications:  ?PTA: Previously on coumadin that was reduced then stopped in 2014 due to recurrent falls ?PTA regimen (2014): 6mg  MWF and 3mg  rest of the week  ? ?Assessment: ?Julie Phillips is a 88 y.o. female with a hx of PAF diagnosed in 2007 not on [place] due to frequent falls, HTN, HLD, hypothyroidism, and obesity who is being seen today for the evaluation of Afib at the request of Julie Phillips. Pharmacy consulted for warfarin and enoxaparin management in the setting of atrial fibrillation. ? ?Date INR  Warfarin Dose  ?2/28 1.1 7.5 mg  ?3/1 1.1 7.5 mg   ? ?No DDI's noted ? ?Goal of Therapy:  ?INR 2-3 ?Monitor platelets by anticoagulation protocol: Yes ?  ?Plan: No change in INR after warfarin 7.5 mg x 1.  ?Give warfarin 7.5 mg again today ?Continue enoxaparin 100 mg q12 hours for at least 5 days and until 2 consecutive INR levels WNL ?INR and CBC daily ? ?Julie Phillips, PharmD ?Pharmacy Resident  ?02/10/2022 ?8:08 AM ? ? ? ?

## 2022-02-10 NOTE — Progress Notes (Signed)
?  Counseled patient on enoxaparin injections. Patient confirmed understanding of dosing schedule and administration. Dispensed 2 home doses for patients dose on 3/1 @1800  and 3/2 @0600 . Patient stated she understood and that she would follow up with her provider.  ? ? ?[name], PharmD ?Pharmacy Resident  ?02/10/2022 ?4:02 PM ? ?

## 2022-02-10 NOTE — Progress Notes (Signed)
Pt HR in the 50s since 0300. Drip turned off and NP notified. Will continue to monitor. Remains in AFIB but rate controlled.  ? ?Bridgette Habermann DNP RN [phone] AM ?

## 2022-02-10 NOTE — Telephone Encounter (Signed)
What does this mean. Please get details. ?

## 2022-02-11 ENCOUNTER — Telehealth: Payer: Self-pay | Admitting: Cardiovascular Disease

## 2022-02-11 ENCOUNTER — Telehealth: Payer: Self-pay | Admitting: Family Medicine

## 2022-02-11 ENCOUNTER — Telehealth: Payer: Self-pay

## 2022-02-11 NOTE — Telephone Encounter (Signed)
We cannot change this medication.  She needs to follow up with whomever is monitoring her warfarin ?

## 2022-02-11 NOTE — Telephone Encounter (Signed)
Lovenox allows for the transition to warfarin but if she doesn't want to take it then I'll defer to her.  She may have a slight inc in stroke risk off lovenox in the meantime, but I'll defer to her.  And we can discuss tomorrow. She could also bring a dose of lovenox to the OV tomorrow.  Thanks.  ?

## 2022-02-11 NOTE — Telephone Encounter (Signed)
Transition Care Management Follow-up Telephone Call ?Date of discharge and from where:[facility] 02-10-22 Dx: A-Fib with RVR  ?How have you been since you were released from the hospital? Doing ok  ?Any questions or concerns? YES-pt has been unable to take Lovenox- states she can't hold her skin and push the medicine at the same time- she does not want to take it and does not see the point in taking it- does not want home health to come out because she does not wan to take it- she is going to take her warfarin tonight-  ? ?Items Reviewed: ?Did the pt receive and understand the discharge instructions provided? yes ?Medications obtained and verified? Yes  ?Other? No  ?Any new allergies since your discharge? No  ?Dietary orders reviewed? Yes ?Do you have support at home? Yes  ? ?Home Care and Equipment/Supplies: ?Were home health services ordered? no ?If so, what is the name of the agency? na  ?Has the agency set up a time to come to the patient's home? not applicable ?Were any new equipment or medical supplies ordered?  No ?What is the name of the medical supply agency? na ?Were you able to get the supplies/equipment? not applicable ?Do you have any questions related to the use of the equipment or supplies? No ? ?Functional Questionnaire: (I = Independent and D = Dependent) ?ADLs: I ? ?Bathing/Dressing- I ? ?Meal Prep- I ? ?Eating- I ? ?Maintaining continence- I ? ?Transferring/Ambulation- I ? ?Managing Meds- I ? ?Follow up appointments reviewed: ? ?PCP Hospital f/u appt confirmed? Yes  Scheduled to see Dr Damita Dunnings on 02-12-22 @ 330pm. ?[facility] f/u appt confirmed? Yes  Scheduled to see Dr Rockey Situ  on 03-01-22 @ 9am. ?Are transportation arrangements needed? No  ?If their condition worsens, is the pt aware to call PCP or go to the Emergency Dept.? Yes ?Was the patient provided with contact information for the PCP's office or ED? Yes ?Was to pt encouraged to call back with questions or concerns? Yes  ?

## 2022-02-11 NOTE — Telephone Encounter (Signed)
LMTCB

## 2022-02-11 NOTE — Telephone Encounter (Signed)
Pt called asking for a return call to discuss her medication. Please advise. ?

## 2022-02-11 NOTE — Telephone Encounter (Signed)
Pt c/o medication issue:  1. Name of Medication: Lovenox  2. How are you currently taking this medication (dosage and times per day)?  100 mg/ML injection  3. Are you having a reaction (difficulty breathing--STAT)?   4. What is your medication issue? Patient states she is unable to use "the needle". Patient request a different medication. Please call to discuss.

## 2022-02-11 NOTE — Telephone Encounter (Signed)
Called and spoke with patient and she has already spoke with another nurse about her medications and questions she had. Nothing further needed.  ?

## 2022-02-12 ENCOUNTER — Encounter: Payer: Self-pay | Admitting: Family Medicine

## 2022-02-12 ENCOUNTER — Other Ambulatory Visit: Payer: Self-pay

## 2022-02-12 ENCOUNTER — Ambulatory Visit (INDEPENDENT_AMBULATORY_CARE_PROVIDER_SITE_OTHER): Payer: PPO | Admitting: Family Medicine

## 2022-02-12 DIAGNOSIS — I480 Paroxysmal atrial fibrillation: Secondary | ICD-10-CM

## 2022-02-12 MED ORDER — WARFARIN SODIUM 5 MG PO TABS
7.5000 mg | ORAL_TABLET | Freq: Every day | ORAL | Status: DC
Start: 2022-02-12 — End: 2022-02-15

## 2022-02-12 NOTE — Telephone Encounter (Signed)
It appears the pt reported yesterday that she will be starting her warfarin last night. The dose she was d/c from hospital with was 7.5 mg. ?Normal starting dose for warfarin is 5 mg daily and check INR in 1 week. This is what is recommended by the coumadin clinic unless there is a specific reason for starting at 7.5 mg. Will forward to PCP to discuss at apt today.  ?Will need to check INR at [facility] on 3/9.  ?

## 2022-02-12 NOTE — Patient Instructions (Signed)
Take 7.5mg  coumadin (1.5 tabs) a day at 4pm for now.  We'll call you about getting set up with the coumadin clinic next week.  ?Take care.  Glad to see you. ?

## 2022-02-12 NOTE — Telephone Encounter (Signed)
Shannon-I need help getting this patient set up with your anticoagulation clinic.  Thanks.   ?
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
## 2022-02-14 NOTE — Telephone Encounter (Signed)
Please see my office visit note.  I left her on the 7.5 mg dosing because she had only had 1 dose since she left the hospital prior to the office visit and she was not going to do a Lovenox bridge.  Please help her get scheduled for follow-up INR this week.  Thank you. ?

## 2022-02-15 ENCOUNTER — Other Ambulatory Visit: Payer: Self-pay

## 2022-02-15 ENCOUNTER — Telehealth: Payer: Self-pay

## 2022-02-15 DIAGNOSIS — Z7901 Long term (current) use of anticoagulants: Secondary | ICD-10-CM

## 2022-02-15 DIAGNOSIS — I480 Paroxysmal atrial fibrillation: Secondary | ICD-10-CM

## 2022-02-15 MED ORDER — WARFARIN SODIUM 5 MG PO TABS: ORAL_TABLET | ORAL | 0 refills | Status: DC

## 2022-02-15 NOTE — Telephone Encounter (Signed)
Documentation from office note with Dr. Damita Dunnings; ?We talked about her situation.  She sounds to be back in regular rhythm now.  Would continue diltiazem and metoprolol.  She is only had 1 dose of Coumadin since leaving the hospital and has not been able to give herself Lovenox.  She does not have anyone at home that can give her injections on a regular basis safely.  We talked about the risk and benefit of use versus not using Lovenox.  We both agreed that she was more likely to have a problem with trying to self administer Lovenox at home then staying off the medication.  She is back in regular rhythm right now.  She is aware that she can go back to the hospital if she becomes tachycardic or has an irregular rhythm.  I would continue Coumadin 7.5 mg a day and we will set up INR early next week with the anticoagulation clinic here.  She agrees with plan.  Continue metoprolol and diltiazem for now. ?

## 2022-02-15 NOTE — Telephone Encounter (Signed)
LVM for pt to call coumadin clinic to make apt. Also need to give pt dosing instructions.  ?

## 2022-02-15 NOTE — Telephone Encounter (Signed)
Patient is calling about labs that she needs to scheduled. Please advise.

## 2022-02-15 NOTE — Telephone Encounter (Signed)
Looks like appt has been scheduled for 02/18/22 to check INR ?

## 2022-02-15 NOTE — Telephone Encounter (Signed)
Documentation with f/u with pt in other telephone encounter from 3/2 ?

## 2022-02-15 NOTE — Telephone Encounter (Signed)
Pt returned call. Advised to take 7.5 mg warfarin daily and have INR checked in coumadin clinic at [facility] on 3/9. Pt agreed and requested a script be sent in. Sent script to [facility]. Advised pt if any changes to contact office. Pt verbalized understanding.  ?Pt was placed on the [facility] coumadin clinic schedule.  ? ?Sent in warfarin script to [place] per pt request.  ?

## 2022-02-15 NOTE — Assessment & Plan Note (Signed)
We talked about her situation.  She sounds to be back in regular rhythm now.  Would continue diltiazem and metoprolol.  She is only had 1 dose of Coumadin since leaving the hospital and has not been able to give herself Lovenox.  She does not have anyone at home that can give her injections on a regular basis safely.  We talked about the risk and benefit of use versus not using Lovenox.  We both agreed that she was more likely to have a problem with trying to self administer Lovenox at home then staying off the medication.  She is back in regular rhythm right now.  She is aware that she can go back to the hospital if she becomes tachycardic or has an irregular rhythm.  I would continue Coumadin 7.5 mg a day and we will set up INR early next week with the anticoagulation clinic here.  She agrees with plan.  Continue metoprolol and diltiazem for now. ? ?35 minutes were devoted to patient care in this encounter (this includes time spent reviewing the patient's file/history, interviewing and examining the patient, counseling/reviewing plan with patient).  ? ?

## 2022-02-15 NOTE — Telephone Encounter (Signed)
Pt needs an apt for coumadin clinic to check her INR. Not sure if this is why the pt is calling. No lab orders in her chart so not sure if she needs labs or an apt to with the coumadin clinic.  ?LVM ?

## 2022-02-16 NOTE — Telephone Encounter (Signed)
Noted. Thanks.

## 2022-02-18 ENCOUNTER — Other Ambulatory Visit: Payer: Self-pay

## 2022-02-18 ENCOUNTER — Ambulatory Visit (INDEPENDENT_AMBULATORY_CARE_PROVIDER_SITE_OTHER): Payer: PPO

## 2022-02-18 DIAGNOSIS — Z7901 Long term (current) use of anticoagulants: Secondary | ICD-10-CM

## 2022-02-18 LAB — POCT INR: INR: 2.9 (ref 2.0–3.0)

## 2022-02-18 MED ORDER — WARFARIN SODIUM 5 MG PO TABS: ORAL_TABLET | ORAL | 0 refills | Status: DC

## 2022-02-18 NOTE — Progress Notes (Signed)
Pt is in range today but discussing with PCP the dose will most likely need to be reduced.  ?Change weekly dose to take 1 1/2 tablets daily except take 1 tablet on Mondays and Thursdays. Recheck in 1 week.  ?

## 2022-02-18 NOTE — Patient Instructions (Addendum)
Pre visit review using our clinic review tool, if applicable. No additional management support is needed unless otherwise documented below in the visit note. ? ?Change weekly dose to take 1 1/2 tablets daily except take 1 tablet on Mondays and Thursdays. Recheck in 1 week.  ?

## 2022-02-25 ENCOUNTER — Other Ambulatory Visit: Payer: Self-pay

## 2022-02-25 ENCOUNTER — Ambulatory Visit (INDEPENDENT_AMBULATORY_CARE_PROVIDER_SITE_OTHER): Payer: PPO

## 2022-02-25 DIAGNOSIS — Z7901 Long term (current) use of anticoagulants: Secondary | ICD-10-CM | POA: Diagnosis not present

## 2022-02-25 LAB — POCT INR: INR: 4 — AB (ref 2.0–3.0)

## 2022-02-25 NOTE — Progress Notes (Signed)
Hold dose today and then change weekly dose to take 1 tablet daily except take 1 1/2 on Mondays and Thursdays. Recheck in 1 week.  ?

## 2022-02-25 NOTE — Patient Instructions (Addendum)
Pre visit review using our clinic review tool, if applicable. No additional management support is needed unless otherwise documented below in the visit note. ? ?Hold dose today and then change weekly dose to take 1 tablet daily except take 1 1/2 on Mondays and Thursdays. Recheck in 1 week.  ?

## 2022-02-28 NOTE — Progress Notes (Signed)
Patient ID: Julie Phillips, female   DOB: [birth year], 88 y.o.   MRN: [id number] ?Cardiology Office Note ? ?Date:  03/01/2022  ? ?ID:  Julie Phillips, DOB 1932-05-05, MRN [id number] ? ?PCP:  Tonia Ghent, MD  ? ?Chief Complaint  ?Patient presents with  ? [facility] follow up   ?  "Doing well." Medications reviewed by the patient verbally.   ? ? ?HPI:  ?Ms. Dohner  is a pleasant 88 year-old woman with remote history of  ?atrial fibrillation in 2007, ? not on anticoagulation (no recurrence in 13 yrs) ?In [place] in setting of hypokalemia (did stress test/pharmacological study) ?low potassium by her report,  ?previously on warfarin, stopped, was falling alot ?hypertension  ?who presents for routine followup of her atrial fibrillation. ? ?Lov 1/23 in normal sinus rhythm ?Reports having rare episodes of chest pain ? ?2 epsiodes of afib dec 2022 and feb 2023, lasting 24 hours ?Chest pain 12/05/2021, was still in bed ?Sharp, whole left side including left breast area up to left shoulder ?Did not seek medical attention, resolved on its own ? ?In the hospital 2/23: afib with RVR ?Converted to normal sinus rhythm in the hospital on diltiazem infusion ?Was given Lovenox bridge to warfarin ? ?Echocardiogram done in the hospital normal ejection fraction, February 10, 2022 ?Mildly dilated left atrium mild MR ? ?Aspirin held, started on diltiazem ER 120 daily, metoprolol succinate 50 daily ?Warfarin ? ?Today feels well,  ?No recent falls , walks with walker ? ? ?Has not had any further episodes of chest pain since that time and has been relatively active, shopping etc. ?If she overdoes it gets tired has to have a rest day then ready to go again ? ?Trace ankle swelling ?Getting tired  ?No falls ?No tachycardia since diltiazem ? ?Sedentary, no exercise ? ?Lab work reviewed ?Total chol 205 ?Does not want a statin ? ?EKG personally reviewed by myself on todays visit ?Shows NSR rate 64 bpm with no ST or T wave changes ? ?Other past medical  history ?She has tried statins in the past and had a problem with her "urine". Details are uncertain ? ?Previous echocardiogram that showed mild MR and TR, normal LV systolic function   ?Stress test was normal showing no ischemia with normal ejection fraction . ?Carotid ultrasound showed minimal chronic plaquing ? ?PMH:   has a past medical history of AF (atrial fibrillation) ([place]), Fall, GERD (gastroesophageal reflux disease), History of chicken pox, colonic polyps, Hyperlipidemia, Hypertension, Hypothyroidism, Keratopathy (2018), and LVH (left ventricular hypertrophy). ? ?PSH:    ?Past Surgical History:  ?Procedure Laterality Date  ? CARDIOVASCULAR STRESS TEST  2011  ? normal   ? keratopathy    ? ? ?Current Outpatient Medications  ?Medication Sig Dispense Refill  ? diltiazem (CARDIZEM CD) 120 MG 24 hr capsule Take 1 capsule (120 mg total) by mouth daily. 30 capsule 0  ? diphenhydrAMINE (BENADRYL) 25 mg capsule Take 1 capsule (25 mg total) by mouth daily as needed.    ? gabapentin (NEURONTIN) 100 MG capsule TAKE 1 CAPSULE BY MOUTH 3 TIMES DAILY ASNEEDED 270 capsule 3  ? levothyroxine (SYNTHROID) 100 MCG tablet TAKE 1 TABLET BY MOUTH ONCE A DAY BEFOREBREAKFAST. 90 tablet 3  ? loratadine (CLARITIN) 10 MG tablet Take 1 tablet (10 mg total) by mouth daily as needed.    ? losartan (COZAAR) 100 MG tablet Take 1 tablet (100 mg total) by mouth daily. 90 tablet 3  ? metoprolol succinate (TOPROL-XL)  50 MG 24 hr tablet Take with or immediately following a meal. 90 tablet 3  ? nitroGLYCERIN (NITROSTAT) 0.4 MG SL tablet Place 1 tablet (0.4 mg total) under the tongue every 5 (five) minutes as needed for chest pain. 25 tablet 3  ? omeprazole (PRILOSEC) 20 MG capsule Take 1 capsule (20 mg total) by mouth daily. 90 capsule 3  ? Polyethyl Glycol-Propyl Glycol (SYSTANE) 0.4-0.3 % GEL ophthalmic gel Place 1 application into both eyes.    ? potassium chloride (KLOR-CON) 10 MEQ tablet Take 1 tablet (10 mEq total) by mouth daily. 90  tablet 3  ? vitamin B-12 (CYANOCOBALAMIN) 1000 MCG tablet Take 1 tablet (1,000 mcg total) by mouth daily.    ? warfarin (COUMADIN) 5 MG tablet TAKE ONE AND ONE-HALF TABLET BY MOUTH DAILY EXCEPT TAKE 1 TABLET ON MONDAYS AND THURSDAYS OR AS DIRECTED BY ANTICOAGULATION CLINIC 45 tablet 0  ? ?No current facility-administered medications for this visit.  ? ?Allergies:   Statins, Wheat, Amlodipine, Eucalyptus oil, and Lisinopril  ? ?Social History:  The patient  reports that she quit smoking about 50 years ago. Her smoking use included cigarettes. She has a 40.00 pack-year smoking history. She has never used smokeless tobacco. She reports that she does not currently use alcohol. She reports that she does not use drugs.  ? ?Family History:   family history includes Breast cancer in her daughter and sister; Heart disease in her father and mother.  ? ?Review of Systems  ?Constitutional: Negative.   ?HENT: Negative.    ?Respiratory: Negative.    ?Cardiovascular: Negative.   ?Gastrointestinal: Negative.   ?Musculoskeletal: Negative.   ?     Leg weakness, poor balance  ?Neurological: Negative.   ?Psychiatric/Behavioral: Negative.    ?All other systems reviewed and are negative. ? ? ?PHYSICAL EXAM: ?VS:  BP 140/70 (BP Location: Left Arm, Patient Position: Sitting, Cuff Size: Large)   Pulse 64   Ht '5\' 1"'$  (1.549 m)   Wt 230 lb 8 oz (104.6 kg)   SpO2 98%   BMI 43.55 kg/m?  , BMI Body mass index is 43.55 kg/m?Marland Kitchen ?Constitutional:  oriented to person, place, and time. No distress.  ?HENT:  ?Head: Grossly normal ?Eyes:  no discharge. No scleral icterus.  ?Neck: No JVD, no carotid bruits  ?Cardiovascular: Regular rate and rhythm, no murmurs appreciated ?Pulmonary/Chest: Clear to auscultation bilaterally, no wheezes or rails ?Abdominal: Soft.  no distension.  no tenderness.  ?Musculoskeletal: Normal range of motion ?Neurological:  normal muscle tone. Coordination normal. No atrophy ?Skin: Skin warm and dry ?Psychiatric: normal  affect, pleasant ? ? ?Recent Labs: ?02/08/2022: ALT 8; B Natriuretic Peptide 354.2 ?02/09/2022: Hemoglobin 12.6; Platelets 291; TSH 1.125 ?02/10/2022: BUN 22; Creatinine, Ser 0.78; Magnesium 2.0; Potassium 3.6; Sodium 136  ? ? ?Lipid Panel ?Lab Results  ?Component Value Date  ? CHOL 199 07/10/2021  ? HDL 49 (L) 07/10/2021  ? [place] 127 (H) 07/10/2021  ? TRIG 124 07/10/2021  ? ?  ? ?Wt Readings from Last 3 Encounters:  ?03/01/22 230 lb 8 oz (104.6 kg)  ?02/12/22 227 lb (103 kg)  ?02/08/22 230 lb (104.3 kg)  ?  ? ?ASSESSMENT AND PLAN: ? ?Paroxysmal atrial fibrillation (HCC) -  ?Two recent episodes dec 2022 and 2/23 ?On warfarin, metoprolol/diltiazem ER ? ?HLD (hyperlipidemia) ?Does not want a statin,  cholesterol around 200 ? ?Chest pain ?Associated with atrial fibrillation ?Periodic  NTG ?episode December 05, 2021 ?Repeat episode 2/23 with afib ?Possibly angina, cardiac CTA ordered ? ?  Essential hypertension ?Blood pressure is well controlled on today's visit. No changes made to the medications. ? ?Morbid obesity ?Unable to exercise given gait limitations, calorie restriction recommended ? ?Leg swelling ?Dependent edema.  Minimal ,  ?No significant change ?Compression hose ? ?GERD ?Better on omeprazole ? ? Total encounter time more than 30 minutes ? Greater than 50% was spent in counseling and coordination of care with the patient ? ? ?Orders Placed This Encounter  ?Procedures  ? EKG 12-Lead  ? ? ? ?Signed, ?Esmond Plants, M.D., Ph.D. ?03/01/2022  ?[place] ?[phone] ? ? ? ?

## 2022-03-01 ENCOUNTER — Ambulatory Visit (INDEPENDENT_AMBULATORY_CARE_PROVIDER_SITE_OTHER): Payer: PPO | Admitting: Cardiovascular Disease

## 2022-03-01 ENCOUNTER — Other Ambulatory Visit: Payer: Self-pay

## 2022-03-01 ENCOUNTER — Encounter: Payer: Self-pay | Admitting: Cardiovascular Disease

## 2022-03-01 VITALS — BP 140/70 | HR 64 | Ht 61.0 in | Wt 230.5 lb

## 2022-03-01 DIAGNOSIS — I480 Paroxysmal atrial fibrillation: Secondary | ICD-10-CM

## 2022-03-01 DIAGNOSIS — M791 Myalgia, unspecified site: Secondary | ICD-10-CM

## 2022-03-01 DIAGNOSIS — I10 Essential (primary) hypertension: Secondary | ICD-10-CM

## 2022-03-01 DIAGNOSIS — T466X5D Adverse effect of antihyperlipidemic and antiarteriosclerotic drugs, subsequent encounter: Secondary | ICD-10-CM | POA: Diagnosis not present

## 2022-03-01 DIAGNOSIS — I209 Angina pectoris, unspecified: Secondary | ICD-10-CM

## 2022-03-01 DIAGNOSIS — R079 Chest pain, unspecified: Secondary | ICD-10-CM | POA: Diagnosis not present

## 2022-03-01 DIAGNOSIS — E782 Mixed hyperlipidemia: Secondary | ICD-10-CM

## 2022-03-01 DIAGNOSIS — Z6841 Body Mass Index (BMI) 40.0 and over, adult: Secondary | ICD-10-CM

## 2022-03-01 MED ORDER — DILTIAZEM HCL ER COATED BEADS 120 MG PO CP24: 120.0000 mg | ORAL_CAPSULE | Freq: Every day | ORAL | 3 refills | Status: DC

## 2022-03-01 MED ORDER — METOPROLOL SUCCINATE ER 50 MG PO TB24: 50.0000 mg | ORAL_TABLET | Freq: Every evening | ORAL | 3 refills | Status: DC

## 2022-03-01 MED ORDER — METOPROLOL TARTRATE 50 MG PO TABS: 50.0000 mg | ORAL_TABLET | Freq: Once | ORAL | 0 refills | Status: DC

## 2022-03-01 MED ORDER — LOSARTAN POTASSIUM 100 MG PO TABS: 100.0000 mg | ORAL_TABLET | Freq: Every day | ORAL | 3 refills | Status: DC

## 2022-03-01 NOTE — Patient Instructions (Addendum)
Medication Instructions:  ?No changes ? ?If you need a refill on your cardiac medications before your next appointment, please call your pharmacy.  ? ?Lab work: ?No new labs needed ? ?Testing/Procedures: ?Cardiac CTA for angina ? ?Follow-Up: ?At [facility], you and your health needs are our priority.  As part of our continuing mission to provide you with exceptional heart care, we have created designated Provider Care Teams.  These Care Teams include your primary Cardiologist (physician) and Advanced Practice Providers (APPs -  Physician Assistants and Nurse Practitioners) who all work together to provide you with the care you need, when you need it. ? ?You will need a follow up appointment in 12 months ? ?Providers on your designated Care Team:   ?Murray Hodgkins, NP ?Christell Faith, PA-C ?Cadence Kathlen Mody, PA-C ? ?COVID-19 Vaccine Information can be found at: [email or web address] For questions related to vaccine distribution or appointments, please email [email or web address] or call [phone].  ? ? ?Cardiac (coronary) CTA  ? ?[place] ?[address] ?[address] ?[place], [place] [address] ?[phone] ? ?Date: 03/11/2022   Time: 08:00 am ?Please arrive 15 mins early for check-in and test prep. ? ?Please follow these instructions carefully: ? ? ?On the Night Before the Test: ?Be sure to Drink plenty of water. ?Do not consume any caffeinated/decaffeinated beverages or chocolate 12 hours prior to your test. ?This includes decaf as well ? ?On the Day of the Test: ?Drink plenty of water until 1 hour prior to the test. ?Do not eat any food 4 hours prior to the test. ?You may take your regular medications prior to the test.  ?Take metoprolol tartrate (Lopressor) two hours prior to test. ?Lopressor 50 mg ?This was sent in to your pharmacy for pick-up ?HOLD Furosemide/Hydrochlorothiazide morning of the test. ?No fluid  pills ?FEMALES- please wear underwire-free bra if available ? ? ?After the Test: ?Drink plenty of water. ?After receiving IV contrast, you may experience a mild flushed feeling. This is normal. ?On occasion, you may experience a mild rash up to 24 hours after the test. This is not dangerous. If this occurs, you can take Benadryl 25 mg and increase your fluid intake (to flush out the kidneys) ?If you experience trouble breathing, this can be serious. If it is severe call 911 IMMEDIATELY. If it is mild, please call our office. ? ?For scheduling needs, including cancellations and rescheduling, please call [place], [phone]. ? ? ?

## 2022-03-04 ENCOUNTER — Other Ambulatory Visit: Payer: Self-pay

## 2022-03-04 ENCOUNTER — Ambulatory Visit (INDEPENDENT_AMBULATORY_CARE_PROVIDER_SITE_OTHER): Payer: PPO

## 2022-03-04 DIAGNOSIS — Z7901 Long term (current) use of anticoagulants: Secondary | ICD-10-CM

## 2022-03-04 LAB — POCT INR: INR: 2.4 (ref 2.0–3.0)

## 2022-03-04 NOTE — Progress Notes (Signed)
Continue 1 tablet daily except take 1 1/2 on Mondays and Thursdays. Recheck in 1 week.  ?

## 2022-03-04 NOTE — Patient Instructions (Addendum)
Pre visit review using our clinic review tool, if applicable. No additional management support is needed unless otherwise documented below in the visit note. ? ?Continue 1 tablet daily except take 1 1/2 on Mondays and Thursdays. Recheck in 1 week.  ?

## 2022-03-09 ENCOUNTER — Telehealth (HOSPITAL_COMMUNITY): Payer: Self-pay | Admitting: Emergency Medicine

## 2022-03-09 NOTE — Telephone Encounter (Signed)
Attempted to call patient regarding upcoming cardiac CT appointment. °Left message on voicemail with name and callback number °Danecia Underdown RN Navigator Cardiac Imaging °[facility] °[phone] Office °[phone] Cell ° °

## 2022-03-10 ENCOUNTER — Telehealth (HOSPITAL_COMMUNITY): Payer: Self-pay | Admitting: Emergency Medicine

## 2022-03-10 NOTE — Telephone Encounter (Signed)
Reaching out to patient to offer assistance regarding upcoming cardiac imaging study; pt verbalizes understanding of appt date/time, parking situation and where to check in, pre-test NPO status and medications ordered, and verified current allergies; name and call back number provided for further questions should they arise ?Marchia Bond RN Navigator Cardiac Imaging ?[facility] ?[phone] office ?[phone] cell ? ?'50mg'$  metoprolol tartrate  ?Denies iv issues ?Arrive 745 ?

## 2022-03-11 ENCOUNTER — Ambulatory Visit
Admission: RE | Admit: 2022-03-11 | Discharge: 2022-03-11 | Disposition: A | Discharge status: Home / Self Care | Payer: PPO | Source: Ambulatory Visit | Attending: Cardiovascular Disease | Admitting: Cardiovascular Disease

## 2022-03-11 ENCOUNTER — Other Ambulatory Visit (HOSPITAL_COMMUNITY): Payer: Self-pay | Admitting: Emergency Medicine

## 2022-03-11 ENCOUNTER — Ambulatory Visit (INDEPENDENT_AMBULATORY_CARE_PROVIDER_SITE_OTHER): Payer: PPO

## 2022-03-11 DIAGNOSIS — Z7901 Long term (current) use of anticoagulants: Secondary | ICD-10-CM | POA: Diagnosis not present

## 2022-03-11 DIAGNOSIS — R079 Chest pain, unspecified: Secondary | ICD-10-CM

## 2022-03-11 DIAGNOSIS — I209 Angina pectoris, unspecified: Secondary | ICD-10-CM

## 2022-03-11 LAB — POCT INR: INR: 2.3 (ref 2.0–3.0)

## 2022-03-11 MED ORDER — METOPROLOL TARTRATE 50 MG PO TABS: 50.0000 mg | ORAL_TABLET | Freq: Once | ORAL | 0 refills | Status: DC

## 2022-03-11 NOTE — Patient Instructions (Addendum)
Pre visit review using our clinic review tool, if applicable. No additional management support is needed unless otherwise documented below in the visit note. ? ?Continue 1 tablet daily except take 1 1/2 on Mondays and Thursdays. Recheck in 1 week.  ?

## 2022-03-11 NOTE — Progress Notes (Addendum)
Continue 1 tablet daily except take 1 1/2 on Mondays and Thursdays. Recheck in 1 week.  ? ?============ ?Agree.  Thanks.  ?Julie Phillips ? ?

## 2022-03-11 NOTE — Progress Notes (Signed)
Pt arrived for CCTA in afib, test aborted. Will r/s for another date. Pt instructed to take all daily meds (dilt, metop succ, + one time dose metop tart). Will resend '50mg'$  metoprolol tartrate to her pharm on file. ? ?Marchia Bond RN Navigator Cardiac Imaging ?[facility] ?[phone] Office  ?[phone] Cell ? ?

## 2022-03-11 NOTE — Progress Notes (Signed)
Patient arrived for Cardiac CT. Heart rate 116-130 A-fib. Denies chest pain or shortness of breath. Patient took Metoprolol Tartrate 50 mg and daily Synthroid only today. States misunderstood she was also suppose to take her other daily medication of Diltiazem '120mg'$  and Metoprolol Succinate '50mg'$ . Spoke with Dr Garen Lah and ordered patient to be rescheduled and to have patient take her daily medications and additional pre medication for Cardiac CT. Patient rescheduled for 03/15/22 at 1330. Patient verbalized understanding and given written information and contact information if any questions.  ?

## 2022-03-12 ENCOUNTER — Telehealth: Payer: Self-pay | Admitting: Cardiovascular Disease

## 2022-03-12 NOTE — Telephone Encounter (Signed)
STAT if HR is under 50 or over 120 ?(normal HR is 60-100 beats per minute) ? ?What is your heart rate? 106 ? ?Do you have a log of your heart rate readings (document readings)? No  ? ?Do you have any other symptoms? Nauceous, swelling, lightheadedness  ?

## 2022-03-12 NOTE — Telephone Encounter (Signed)
Called and spoke to pt.  ?Pt states that she checked her BP/HR ~ 2:30 PM today and BP 140/87 and HR 106.  ?Pt states she did have some light headedness yesterday morning around 9 AM, but has not had any symptoms since.  ?Pt denies any symptoms now at this time. Pt states that she continued checking BP because she was concerned about HR of 106. At 3 PM 117/85 HR 94.  ?BP/HR now at 3:30 PM 108/73  HR 73. ? ?Pt recently seen in office with Dr. Rockey Situ 03/01/22.  ?Has h/o paroxysmal a fib. States she can tell when she is in a fib and does not feel that she is at this time.  ?Pt continues medications as prescribed per med list.  ? ?Pt proceeded to ask about CCTA that is scheduled Monday.  ?Pt asked when Metoprolol to be sent in. Verified with pt that lopressor 50 mg sent in yesterday to [place]. Pt states she will call them to verify ready for pick up.  ? ?Advised pt continue to monitor BP/HR and symptoms and let us know of any changes.  ?ER precautions given.  ? ?Pt voiced understanding and has no further questions at this time.  ?

## 2022-03-15 ENCOUNTER — Ambulatory Visit: Admission: RE | Admit: 2022-03-15 | Payer: PPO | Source: Ambulatory Visit

## 2022-03-15 ENCOUNTER — Telehealth: Payer: Self-pay | Admitting: Cardiovascular Disease

## 2022-03-15 NOTE — Telephone Encounter (Signed)
Received message from Dr. Garen Lah that cardiac CTA was unable to be performed as she was in atrial fibrillation, rate was elevated ?We will need to change to alternate test/imaging study ? ?Would recommend that we increase her diltiazem extended release up to 180 mg daily ? ?Rather than cardiac CTA, we can schedule for Myoview/lexiscan given prior history of chest discomfort ? ?Thx TG ?

## 2022-03-16 ENCOUNTER — Other Ambulatory Visit: Payer: Self-pay

## 2022-03-16 MED ORDER — DILTIAZEM HCL ER COATED BEADS 180 MG PO CP24: 180.0000 mg | ORAL_CAPSULE | Freq: Every day | ORAL | 3 refills | Status: DC

## 2022-03-16 MED ORDER — DILTIAZEM HCL ER COATED BEADS 180 MG PO CP24: 180.0000 mg | ORAL_CAPSULE | Freq: Every day | ORAL | 0 refills | Status: DC

## 2022-03-16 NOTE — Telephone Encounter (Signed)
Called and spoke with patient. Went over Dr. Donivan Scull recommendations.  ? ?Patient willing to increase diltiazem dosage to 180 mg daily. Updated prescription sent into pharmacy.  ? ?Pt would like to wait on having a myoview/lexiscan at this time as she's had one in the past and she said that it was a scary experience. She would like to see how she does over the next month.  ? ?Will route this to Dr. Rockey Situ.  ?

## 2022-03-18 ENCOUNTER — Ambulatory Visit (INDEPENDENT_AMBULATORY_CARE_PROVIDER_SITE_OTHER): Payer: PPO

## 2022-03-18 DIAGNOSIS — Z7901 Long term (current) use of anticoagulants: Secondary | ICD-10-CM | POA: Diagnosis not present

## 2022-03-18 LAB — POCT INR: INR: 3.1 — AB (ref 2.0–3.0)

## 2022-03-18 NOTE — Progress Notes (Signed)
Reduce dose today to only take 1 tablet and then continue 1 tablet daily except take 1 1/2 on Mondays and Thursdays. Recheck in 2 week.  ?

## 2022-03-18 NOTE — Patient Instructions (Addendum)
Pre visit review using our clinic review tool, if applicable. No additional management support is needed unless otherwise documented below in the visit note. ? ?Reduce dose today to only take 1 tablet and then continue 1 tablet daily except take 1 1/2 on Mondays and Thursdays. Recheck in 2 week.  ?

## 2022-03-30 ENCOUNTER — Telehealth: Payer: Self-pay | Admitting: Cardiovascular Disease

## 2022-03-30 NOTE — Telephone Encounter (Signed)
Pt c/o medication issue: ? ?1. Name of Medication: diltiazem (CARDIZEM CD) 180 MG 24 hr capsule ? ?2. How are you currently taking this medication (dosage and times per day)? Take 1 capsule (180 mg total) by mouth daily. ? ?3. Are you having a reaction (difficulty breathing--STAT)?  ? ?4. What is your medication issue? Patient is having swelling in her feet and ankles, chest congestion.  States it has dropped her BP to 128/78, that low for her.  Just hasn't made her feel good.   ?

## 2022-03-30 NOTE — Telephone Encounter (Signed)
Called and spoke with patient.  ? ?Patient reports that she's exhausted all the time, having bilateral ankle and foot swelling, is having SOB that is worse at night (at times she has woken up gasping).  ? ?From phone encounter 2 weeks ago:  ? ?"Called and spoke with patient. Went over Dr. Donivan Scull recommendations.  ?  ?Patient willing to increase diltiazem dosage to 180 mg daily. Updated prescription sent into pharmacy."  ? ?States all this started when her diltiazem was increased.  ? ?Advised patient that I would forward to MD for review.  ?

## 2022-03-31 MED ORDER — DILTIAZEM HCL ER COATED BEADS 120 MG PO CP24: 120.0000 mg | ORAL_CAPSULE | Freq: Every day | ORAL | 3 refills | Status: DC

## 2022-03-31 NOTE — Telephone Encounter (Signed)
Spoke with patient.  ? ?Advised her that Dr. Rockey Situ recommends going back down to 120 mg daily of Diltiazem to see if that improves her symptoms.  ? ?Patient asked if that would help with the nasal and chest congestion as well. I advised her that she should seek care with her PCP for this, as it could be allergies or infection causing this.  ? ?Pt verbalized understanding of everything discussed and voiced appreciation for the call.  ? ?Reduced dose of diltiazem sent into patient's preferred pharmacy.  ?

## 2022-03-31 NOTE — Telephone Encounter (Signed)
Minna Merritts, MD   ? ?We can reduce the dose back to diltiazem ER 120 mg daily  ?Thx  ?TG   ? ?

## 2022-03-31 NOTE — Addendum Note (Signed)
Addended by: Mila Merry on: 03/31/2022 08:08 AM ? ? Modules accepted: Orders ? ?

## 2022-04-01 ENCOUNTER — Observation Stay
Admission: EM | Admit: 2022-04-01 | Discharge: 2022-04-02 | Disposition: A | Discharge status: Home / Self Care | Payer: PPO | Attending: Internal Medicine | Admitting: Internal Medicine

## 2022-04-01 ENCOUNTER — Other Ambulatory Visit: Payer: Self-pay

## 2022-04-01 ENCOUNTER — Emergency Department: Payer: PPO

## 2022-04-01 ENCOUNTER — Ambulatory Visit (INDEPENDENT_AMBULATORY_CARE_PROVIDER_SITE_OTHER): Payer: PPO | Admitting: Nurse Practitioner

## 2022-04-01 ENCOUNTER — Ambulatory Visit (INDEPENDENT_AMBULATORY_CARE_PROVIDER_SITE_OTHER): Payer: PPO

## 2022-04-01 ENCOUNTER — Encounter: Payer: Self-pay | Admitting: Nurse Practitioner

## 2022-04-01 VITALS — BP 120/68 | HR 86 | Temp 97.2°F

## 2022-04-01 DIAGNOSIS — E039 Hypothyroidism, unspecified: Secondary | ICD-10-CM | POA: Insufficient documentation

## 2022-04-01 DIAGNOSIS — Z20822 Contact with and (suspected) exposure to covid-19: Secondary | ICD-10-CM | POA: Diagnosis not present

## 2022-04-01 DIAGNOSIS — I10 Essential (primary) hypertension: Secondary | ICD-10-CM | POA: Diagnosis present

## 2022-04-01 DIAGNOSIS — Z87891 Personal history of nicotine dependence: Secondary | ICD-10-CM | POA: Insufficient documentation

## 2022-04-01 DIAGNOSIS — Z7901 Long term (current) use of anticoagulants: Secondary | ICD-10-CM | POA: Diagnosis not present

## 2022-04-01 DIAGNOSIS — I480 Paroxysmal atrial fibrillation: Secondary | ICD-10-CM

## 2022-04-01 DIAGNOSIS — Z79899 Other long term (current) drug therapy: Secondary | ICD-10-CM | POA: Insufficient documentation

## 2022-04-01 DIAGNOSIS — R0602 Shortness of breath: Secondary | ICD-10-CM | POA: Diagnosis not present

## 2022-04-01 DIAGNOSIS — I509 Heart failure, unspecified: Secondary | ICD-10-CM | POA: Diagnosis not present

## 2022-04-01 DIAGNOSIS — I4891 Unspecified atrial fibrillation: Secondary | ICD-10-CM | POA: Diagnosis not present

## 2022-04-01 DIAGNOSIS — I5033 Acute on chronic diastolic (congestive) heart failure: Secondary | ICD-10-CM | POA: Diagnosis not present

## 2022-04-01 DIAGNOSIS — I110 Hypertensive heart disease with heart failure: Secondary | ICD-10-CM | POA: Diagnosis not present

## 2022-04-01 DIAGNOSIS — I482 Chronic atrial fibrillation, unspecified: Secondary | ICD-10-CM | POA: Diagnosis present

## 2022-04-01 LAB — HEPATIC FUNCTION PANEL
ALT: 10 U/L (ref 0–44)
AST: 15 U/L (ref 15–41)
Albumin: 3.5 g/dL (ref 3.5–5.0)
Alkaline Phosphatase: 91 U/L (ref 38–126)
Bilirubin, Direct: 0.2 mg/dL (ref 0.0–0.2)
Indirect Bilirubin: 0.6 mg/dL (ref 0.3–0.9)
Total Bilirubin: 0.8 mg/dL (ref 0.3–1.2)
Total Protein: 7.4 g/dL (ref 6.5–8.1)

## 2022-04-01 LAB — TROPONIN I (HIGH SENSITIVITY)
Troponin I (High Sensitivity): 7 ng/L (ref <18)
Troponin I (High Sensitivity): 7 ng/L (ref <18)

## 2022-04-01 LAB — URINALYSIS, ROUTINE W REFLEX MICROSCOPIC
Bilirubin Urine: NEGATIVE
Glucose, UA: NEGATIVE mg/dL
Ketones, ur: NEGATIVE mg/dL
Nitrite: NEGATIVE
Protein, ur: NEGATIVE mg/dL
Specific Gravity, Urine: 1.004 — ABNORMAL LOW (ref 1.005–1.030)
pH: 7 (ref 5.0–8.0)

## 2022-04-01 LAB — PROTIME-INR
INR: 2.6 — ABNORMAL HIGH (ref 0.8–1.2)
Prothrombin Time: 27.7 seconds — ABNORMAL HIGH (ref 11.4–15.2)

## 2022-04-01 LAB — BASIC METABOLIC PANEL
Anion gap: 6 (ref 5–15)
BUN: 13 mg/dL (ref 8–23)
CO2: 22 mmol/L (ref 22–32)
Calcium: 8.9 mg/dL (ref 8.9–10.3)
Chloride: 107 mmol/L (ref 98–111)
Creatinine, Ser: 0.79 mg/dL (ref 0.44–1.00)
GFR, Estimated: >60 mL/min (ref >60)
Glucose, Bld: 99 mg/dL (ref 70–99)
Potassium: 4.2 mmol/L (ref 3.5–5.1)
Sodium: 135 mmol/L (ref 135–145)

## 2022-04-01 LAB — RESP PANEL BY RT-PCR (FLU A&B, COVID) ARPGX2
Influenza A by PCR: NEGATIVE
Influenza B by PCR: NEGATIVE
SARS Coronavirus 2 by RT PCR: NEGATIVE

## 2022-04-01 LAB — CBC
HCT: 35.8 % — ABNORMAL LOW (ref 36.0–46.0)
Hemoglobin: 11.2 g/dL — ABNORMAL LOW (ref 12.0–15.0)
MCH: 27.3 pg (ref 26.0–34.0)
MCHC: 31.3 g/dL (ref 30.0–36.0)
MCV: 87.1 fL (ref 80.0–100.0)
Platelets: 272 10*3/uL (ref 150–400)
RBC: 4.11 MIL/uL (ref 3.87–5.11)
RDW: 14.7 % (ref 11.5–15.5)
WBC: 9.8 10*3/uL (ref 4.0–10.5)
nRBC: 0 % (ref 0.0–0.2)

## 2022-04-01 LAB — MAGNESIUM: Magnesium: 2.1 mg/dL (ref 1.7–2.4)

## 2022-04-01 LAB — POCT INR: INR: 3.2 — AB (ref 2.0–3.0)

## 2022-04-01 LAB — TSH: TSH: 2.542 u[IU]/mL (ref 0.350–4.500)

## 2022-04-01 LAB — BRAIN NATRIURETIC PEPTIDE: B Natriuretic Peptide: 696.7 pg/mL — ABNORMAL HIGH (ref 0.0–100.0)

## 2022-04-01 MED ORDER — GABAPENTIN 100 MG PO CAPS: 100.0000 mg | ORAL_CAPSULE | Freq: Three times a day (TID) | ORAL | Status: DC | PRN

## 2022-04-01 MED ORDER — SODIUM CHLORIDE 0.9% FLUSH
3.0000 mL | INTRAVENOUS | Status: DC | PRN
Start: 2022-04-01 — End: 2022-04-02

## 2022-04-01 MED ORDER — SODIUM CHLORIDE 0.9 % IV SOLN: 250.0000 mL | INTRAVENOUS | Status: DC | PRN

## 2022-04-01 MED ORDER — METOPROLOL TARTRATE 5 MG/5ML IV SOLN
5.0000 mg | Freq: Once | INTRAVENOUS | Status: AC
  Administered 2022-04-01: 5 mg via INTRAVENOUS
  Filled 2022-04-01: qty 5

## 2022-04-01 MED ORDER — PANTOPRAZOLE SODIUM 40 MG PO TBEC
40.0000 mg | DELAYED_RELEASE_TABLET | Freq: Every day | ORAL | Status: DC
  Administered 2022-04-01 – 2022-04-02 (×2): 40 mg via ORAL
  Filled 2022-04-01: qty 1

## 2022-04-01 MED ORDER — FUROSEMIDE 10 MG/ML IJ SOLN
40.0000 mg | Freq: Once | INTRAMUSCULAR | Status: AC
  Administered 2022-04-01: 40 mg via INTRAVENOUS
  Filled 2022-04-01: qty 4

## 2022-04-01 MED ORDER — AMIODARONE HCL IN DEXTROSE 360-4.14 MG/200ML-% IV SOLN
60.0000 mg/h | INTRAVENOUS | Status: DC
  Administered 2022-04-01: 30 mg/h via INTRAVENOUS
  Administered 2022-04-02: 60 mg/h via INTRAVENOUS
  Administered 2022-04-02: 30 mg/h via INTRAVENOUS
  Filled 2022-04-01 (×3): qty 200

## 2022-04-01 MED ORDER — METOPROLOL SUCCINATE ER 50 MG PO TB24
50.0000 mg | ORAL_TABLET | Freq: Two times a day (BID) | ORAL | Status: DC
  Administered 2022-04-01 – 2022-04-02 (×3): 50 mg via ORAL
  Filled 2022-04-01 (×3): qty 1

## 2022-04-01 MED ORDER — POTASSIUM CHLORIDE CRYS ER 20 MEQ PO TBCR: 10.0000 meq | EXTENDED_RELEASE_TABLET | Freq: Every day | ORAL | Status: DC

## 2022-04-01 MED ORDER — SODIUM CHLORIDE 0.9% FLUSH
3.0000 mL | Freq: Two times a day (BID) | INTRAVENOUS | Status: DC
  Administered 2022-04-01 – 2022-04-02 (×3): 3 mL via INTRAVENOUS

## 2022-04-01 MED ORDER — POLYVINYL ALCOHOL 1.4 % OP SOLN: 1.0000 [drp] | Freq: Four times a day (QID) | OPHTHALMIC | Status: DC | PRN

## 2022-04-01 MED ORDER — AMIODARONE LOAD VIA INFUSION
150.0000 mg | Freq: Once | INTRAVENOUS | Status: AC
  Administered 2022-04-01: 150 mg via INTRAVENOUS
  Filled 2022-04-01: qty 83.34

## 2022-04-01 MED ORDER — METOPROLOL SUCCINATE ER 50 MG PO TB24: 50.0000 mg | ORAL_TABLET | Freq: Every evening | ORAL | Status: DC

## 2022-04-01 MED ORDER — ACETAMINOPHEN 325 MG PO TABS: 650.0000 mg | ORAL_TABLET | ORAL | Status: DC | PRN

## 2022-04-01 MED ORDER — VITAMIN B-12 1000 MCG PO TABS
1000.0000 ug | ORAL_TABLET | Freq: Every day | ORAL | Status: DC
  Administered 2022-04-02: 1000 ug via ORAL
  Filled 2022-04-01: qty 1

## 2022-04-01 MED ORDER — ONDANSETRON HCL 4 MG/2ML IJ SOLN: 4.0000 mg | Freq: Four times a day (QID) | INTRAMUSCULAR | Status: DC | PRN

## 2022-04-01 MED ORDER — WARFARIN - PHARMACIST DOSING INPATIENT
Freq: Every day | Status: DC
  Filled 2022-04-01: qty 1

## 2022-04-01 MED ORDER — LORATADINE 10 MG PO TABS: 10.0000 mg | ORAL_TABLET | Freq: Every day | ORAL | Status: DC | PRN

## 2022-04-01 MED ORDER — FUROSEMIDE 10 MG/ML IJ SOLN: 40.0000 mg | Freq: Once | INTRAMUSCULAR | Status: DC

## 2022-04-01 MED ORDER — NITROGLYCERIN 0.4 MG SL SUBL: 0.4000 mg | SUBLINGUAL_TABLET | SUBLINGUAL | Status: DC | PRN

## 2022-04-01 MED ORDER — LEVOTHYROXINE SODIUM 100 MCG PO TABS
100.0000 ug | ORAL_TABLET | Freq: Every day | ORAL | Status: DC
  Administered 2022-04-02: 100 ug via ORAL
  Filled 2022-04-01: qty 1

## 2022-04-01 MED ORDER — FUROSEMIDE 10 MG/ML IJ SOLN
40.0000 mg | Freq: Every day | INTRAMUSCULAR | Status: DC
  Administered 2022-04-02: 40 mg via INTRAVENOUS
  Filled 2022-04-01: qty 4

## 2022-04-01 MED ORDER — AMIODARONE HCL IN DEXTROSE 360-4.14 MG/200ML-% IV SOLN
60.0000 mg/h | INTRAVENOUS | Status: AC
  Administered 2022-04-01 (×2): 60 mg/h via INTRAVENOUS
  Filled 2022-04-01: qty 200

## 2022-04-01 MED ORDER — WARFARIN SODIUM 5 MG PO TABS: 5.0000 mg | ORAL_TABLET | Freq: Every day | ORAL | Status: DC

## 2022-04-01 MED ORDER — DILTIAZEM HCL ER COATED BEADS 120 MG PO CP24
120.0000 mg | ORAL_CAPSULE | Freq: Every day | ORAL | Status: DC
  Administered 2022-04-02: 120 mg via ORAL
  Filled 2022-04-01: qty 1

## 2022-04-01 MED ORDER — LOSARTAN POTASSIUM 50 MG PO TABS
100.0000 mg | ORAL_TABLET | Freq: Every day | ORAL | Status: DC
  Administered 2022-04-01 – 2022-04-02 (×2): 100 mg via ORAL
  Filled 2022-04-01: qty 2

## 2022-04-01 NOTE — ED Triage Notes (Signed)
Pt from [facility] office for SOB x 3 days. Pt has hx of afib but denies hx of CHF. SOB worse when lying flat and on exertion. Bilateral lower extremity swelling noted on arrival. Pt in NAD and denies cp or current sob while sitting still.  ?

## 2022-04-01 NOTE — Assessment & Plan Note (Signed)
Patient with a history of A-fib currently managed by Dr. Ida Rogue.  On rate control drugs.  States that she has had atrial fibrillation with RVR in the past this feels different.  EKG after patient is sitting and resting for approximate 5 minutes showed heart rate at 140 and irregular.  Patient cannot walk 5 feet and get on exam table without being noticeably winded.  No history of heart failure per patient report.  After EKG is seen how little the patient can move without being in distress did recommend patient go to the emergency department.  Patient eventually agreed with plan and disposition EMS was called and care handed over to paramedics. ?

## 2022-04-01 NOTE — Consult Note (Signed)
? ? ? ?Cardiology Consultation:  ? ?Patient ID: Julie Phillips; [id number]; 1931-12-29  ? ?Admit date: 04/01/2022 ?Date of Consult: 04/01/2022 ? ?Primary Care Provider: Tonia Ghent, MD ?Primary Cardiologist: Rockey Situ ?Primary Electrophysiologist:  None ? ? ?Patient Profile:  ? ?Julie Phillips is a 88 y.o. female with a hx of persistent A-fib, HTN, HLD, hypothyroidism, frequent falls, and obesity who is being seen today for the evaluation of Afib with RVR and volume overload at the request of Dr. Francine Graven. ? ?History of Present Illness:  ? ?Ms. Neace was diagnosed with A-fib in [place] in the setting of hypokalemia in 2007.  Echo at that time demonstrated preserved LVEF with mild mitral regurgitation and tricuspid regurgitation.  Stress test showed no evidence of ischemia.  She was previously on warfarin, though this was discontinued due to frequent falls.  It was also noted that she had a lack of recurrence of palpitations there after.  Following balance classes, her fall burden significantly improved. ? ?She was admitted to the hospital in 01/2022 with chest pressure with mildly elevated high-sensitivity troponin and noted to be in A-fib with RVR.  At that time, she noted an episode of tachypalpitations back around Christmas, though did not seek care at that time.  Echo during the admission demonstrated an EF of 65 to 70%, no regional wall motion abnormalities, mild LVH, grade 2 diastolic dysfunction, normal RV systolic function and ventricular cavity size, mildly dilated left atrium, mild mitral regurgitation with moderate mitral annular calcification, and aortic valve sclerosis without evidence of stenosis.  Estimated right atrial pressure of 3 mmHg.  Given improvement in her balance, she was subsequently anticoagulated with warfarin.  Outpatient ischemic testing was recommended, though coronary CTA was deferred secondary to persistent A-fib.  She preferred to wait on potentially pursuing Lexiscan MPI.   ? ?She presented to her PCPs office earlier today for routine INR check and noted she had been having worsening shortness of breath with some palpitations.  She was subsequently worked in to their schedule and noted to be in A-fib with RVR with ventricular rates up to 140 bpm.  In this setting, she was transferred to the [facility] ED where she remained in A-fib with RVR.  Labs notable for BNP 696, high-sensitivity troponin 7 with a delta troponin pending, unremarkable BMP, and Hgb 11.2.  In the ED, she received IV Lasix 40 mg with vigorous urine output, having already voided 1 L along with 5 mg of IV Lopressor with improvement in ventricular rates to the low 100s to 120s bpm.  She does continue to note shortness of breath and lower extremity swelling.  She is without symptoms of angina, dizziness, presyncope, or syncope. ? ? ? ?Past Medical History:  ?Diagnosis Date  ? AF (atrial fibrillation) ([place])   ? Fall   ? GERD (gastroesophageal reflux disease)   ? History of chicken pox   ? Hx of colonic polyps   ? Hyperlipidemia   ? Hypertension   ? Hypothyroidism   ? Keratopathy 2018  ? R eye  ? LVH (left ventricular hypertrophy)   ? ? ?Past Surgical History:  ?Procedure Laterality Date  ? CARDIOVASCULAR STRESS TEST  2011  ? normal   ? keratopathy    ?  ? ?Home Meds: ?Prior to Admission medications   ?Medication Sig Start Date End Date Taking? Authorizing Provider  ?diltiazem (CARDIZEM CD) 120 MG 24 hr capsule Take 1 capsule (120 mg total) by mouth daily. 03/31/22  Minna Merritts, MD  ?diphenhydrAMINE (BENADRYL) 25 mg capsule Take 1 capsule (25 mg total) by mouth daily as needed. 01/14/17   Tonia Ghent, MD  ?gabapentin (NEURONTIN) 100 MG capsule TAKE 1 CAPSULE BY MOUTH 3 TIMES DAILY ASNEEDED 01/29/21   Tonia Ghent, MD  ?levothyroxine (SYNTHROID) 100 MCG tablet TAKE 1 TABLET BY MOUTH ONCE A DAY BEFOREBREAKFAST. 07/12/21   Tonia Ghent, MD  ?loratadine (CLARITIN) 10 MG tablet Take 1 tablet (10 mg total) by mouth  daily as needed. 01/14/17   Tonia Ghent, MD  ?losartan (COZAAR) 100 MG tablet Take 1 tablet (100 mg total) by mouth daily. 03/01/22   Minna Merritts, MD  ?metoprolol succinate (TOPROL-XL) 50 MG 24 hr tablet Take 1 tablet (50 mg total) by mouth every evening. 03/01/22   Minna Merritts, MD  ?metoprolol tartrate (LOPRESSOR) 50 MG tablet Take 1 tablet (50 mg total) by mouth once for 1 dose. Take 2 hours before your CCTA procedure 03/11/22 03/11/22  Kate Sable, MD  ?nitroGLYCERIN (NITROSTAT) 0.4 MG SL tablet Place 1 tablet (0.4 mg total) under the tongue every 5 (five) minutes as needed for chest pain. 01/01/22   Minna Merritts, MD  ?omeprazole (PRILOSEC) 20 MG capsule Take 1 capsule (20 mg total) by mouth daily. 07/10/21   Tonia Ghent, MD  ?Polyethyl Glycol-Propyl Glycol (SYSTANE) 0.4-0.3 % GEL ophthalmic gel Place 1 application into both eyes.    [provider]  ?potassium chloride (KLOR-CON) 10 MEQ tablet Take 1 tablet (10 mEq total) by mouth daily. 07/12/21   Tonia Ghent, MD  ?vitamin B-12 (CYANOCOBALAMIN) 1000 MCG tablet Take 1 tablet (1,000 mcg total) by mouth daily. 02/10/22   Jennye Boroughs, MD  ?warfarin (COUMADIN) 5 MG tablet TAKE ONE AND ONE-HALF TABLET BY MOUTH DAILY EXCEPT TAKE 1 TABLET ON MONDAYS AND THURSDAYS OR AS DIRECTED BY ANTICOAGULATION CLINIC 02/18/22   Tonia Ghent, MD  ? ? ?Inpatient Medications: ?Scheduled Meds: ? amiodarone  150 mg Intravenous Once  ? diltiazem  120 mg Oral Daily  ? [START ON 04/02/2022] furosemide  40 mg Intravenous Daily  ? [START ON 04/02/2022] levothyroxine  100 mcg Oral Q0600  ? losartan  100 mg Oral Daily  ? [START ON 04/02/2022] metoprolol succinate  50 mg Oral QPM  ? pantoprazole  40 mg Oral Daily  ? Polyethyl Glycol-Propyl Glycol  1 application. Both Eyes Once  ? potassium chloride  10 mEq Oral Daily  ? sodium chloride flush  3 mL Intravenous Q12H  ? vitamin B-12  1,000 mcg Oral Daily  ? Warfarin - Pharmacist Dosing Inpatient   Does not  apply [id number]  ? ?Continuous Infusions: ? sodium chloride    ? amiodarone    ? Followed by  ? amiodarone    ? ?PRN Meds: ?sodium chloride, acetaminophen, gabapentin, loratadine, nitroGLYCERIN, ondansetron (ZOFRAN) IV, sodium chloride flush ? ?Allergies:   ?Allergies  ?Allergen Reactions  ? Statins   ?  Myalgias  ? Wheat   ?  Throat swelling  ? Amlodipine   ?  hematuria  ? Eucalyptus Oil   ? Lisinopril   ?  Unrecalled BP med- possibly lisinopril- intolerant, had cough on the medicine- changed to cozaar  ? ? ?Social History:   ?Social History  ? ?Socioeconomic History  ? Marital status: Widowed  ?  Spouse name: Not on file  ? Number of children: Not on file  ? Years of education: Not on file  ?  Highest education level: Not on file  ?Occupational History  ? Occupation: Retired Environmental education officer  ?  Employer: RETIRED  ?Tobacco Use  ? Smoking status: Former  ?  Packs/day: 2.00  ?  Years: 20.00  ?  Pack years: 40.00  ?  Types: Cigarettes  ?  Quit date: 12/14/1971  ?  Years since quitting: 50.3  ? Smokeless tobacco: Never  ?Vaping Use  ? Vaping Use: Never used  ?Substance and Sexual Activity  ? Alcohol use: Not Currently  ? Drug use: No  ? Sexual activity: Not Currently  ?Other Topics Concern  ? Not on file  ?Social History Narrative  ? Divorced  ? Education:  [employer]  ? 8 pregnancies, 6 live births  ? LMP: 1985  ? Lives in independent living.    ? ?Social Determinants of Health  ? ?Financial Resource Strain: Low Risk   ? Difficulty of Paying Living Expenses: Not very hard  ?Food Insecurity: No Food Insecurity  ? Worried About Charity fundraiser in the Last Year: Never true  ? Ran Out of Food in the Last Year: Never true  ?Transportation Needs: Not on file  ?Physical Activity: Not on file  ?Stress: Not on file  ?Social Connections: Not on file  ?Intimate Partner Violence: Not on file  ?  ? ?Family History:   ?Family History  ?Problem Relation Age of Onset  ? Heart disease Mother   ? Heart disease Father   ? Breast  cancer Sister   ? Breast cancer Daughter   ? Colon cancer Neg Hx   ? ? ?ROS:  ?Review of Systems  ?Constitutional:  Positive for malaise/fatigue. Negative for chills, diaphoresis, fever and weight loss.  ?HENT:  Neg

## 2022-04-01 NOTE — Progress Notes (Signed)
?   04/01/22 2000  ?Assess: MEWS Score  ?Temp 97.8 ?F (36.6 ?C)  ?BP (!) 121/94  ?ECG Heart Rate (!) 117  ?Resp (!) 24  ?Level of Consciousness Alert  ?SpO2 96 %  ?O2 Device Room Air  ?Assess: MEWS Score  ?MEWS Temp 0  ?MEWS Systolic 0  ?MEWS Pulse 2  ?MEWS RR 1  ?MEWS LOC 0  ?MEWS Score 3  ?MEWS Score Color Yellow  ?Assess: if the MEWS score is Yellow or Red  ?Were vital signs taken at a resting state? Yes  ?Focused Assessment No change from prior assessment  ?Does the patient meet 2 or more of the SIRS criteria? Yes  ?Does the patient have a confirmed or suspected source of infection? No  ?MEWS guidelines implemented *See Row Information* Yes  ?Treat  ?MEWS Interventions Administered scheduled meds/treatments  ?Pain Score 0  ?Take Vital Signs  ?Increase Vital Sign Frequency  Yellow: Q 2hr X 2 then Q 4hr X 2, if remains yellow, continue Q 4hrs  ?Escalate  ?MEWS: Escalate Yellow: discuss with charge nurse/RN and consider discussing with provider and RRT  ?Notify: Charge Nurse/RN  ?Name of Charge Nurse/RN Notified Solmon Ice, RN  ?Date Charge Nurse/RN Notified 04/01/22  ?Time Charge Nurse/RN Notified 2010  ?Assess: SIRS CRITERIA  ?SIRS Temperature  0  ?SIRS Pulse 1  ?SIRS Respirations  1  ?SIRS WBC 0  ?SIRS Score Sum  2  ? ? ?

## 2022-04-01 NOTE — Assessment & Plan Note (Signed)
EKG in office does show atrial fibrillation with RVR.  Patient also had extreme shortness of breath with walking approximately 5 feet. ?

## 2022-04-01 NOTE — Assessment & Plan Note (Signed)
Stable Continue Synthroid 

## 2022-04-01 NOTE — Progress Notes (Signed)
? ?Acute Office Visit ? ?Subjective:  ? ?  ?Patient ID: Julie Phillips, female    DOB: 01-14-1932, 88 y.o.   MRN: [id number] ? ?Chief Complaint  ?Patient presents with  ? Shortness of Breath  ?  Since 03/31/22, does have hx of afib. Some shooting pain in the upper right arm.   ? ? ? ?Patient is in today for Shortness of breath ? ?Symptoms started on 03/31/2021 around 2-3 in the afternoon. Was just sitting there. States at rest. Worse movement and worse with laying flat states she cannot lie flat at all which is not her baseline. ?States that she has a shotting pain in the right arm that lasts for approx 30 seconds and has happened 5-6 times.  Patient denies injury to arm shoulder or neck. ?States that when she has afib rvr she will have pain on the left arm and can see her heart beating, she has not had the symptoms. ? ?She is seeing Dr. Esmond Plants for cardiology. Saw him last month. ? ?Review of Systems  ?Constitutional:  Negative for chills (feels cold inside to out) and fever.  ?Respiratory:  Positive for shortness of breath. Negative for wheezing.   ?Cardiovascular:  Negative for chest pain and palpitations.  ?Genitourinary:  Negative for dysuria and frequency.  ?Neurological:  Positive for dizziness (intermittnet lightheadness).  ? ? ?   ?Objective:  ?  ?BP 120/68   Pulse 86   Temp (!) 97.2 ?F (36.2 ?C)   SpO2 98%  ?Wt Readings from Last 3 Encounters:  ?04/01/22 250 lb 6.4 oz (113.6 kg)  ?03/01/22 230 lb 8 oz (104.6 kg)  ?02/12/22 227 lb (103 kg)  ? ?  ? ?Physical Exam ?Vitals and nursing note reviewed.  ?Constitutional:   ?   Appearance: She is well-developed. She is obese.  ?Cardiovascular:  ?   Rate and Rhythm: Tachycardia present. Rhythm irregular.  ?   Pulses: Normal pulses.  ?   Heart sounds: Normal heart sounds.  ?Pulmonary:  ?   Effort: Pulmonary effort is normal.  ?   Breath sounds: Normal breath sounds.  ?Abdominal:  ?   General: Bowel sounds are normal.  ?Musculoskeletal:  ?   Right lower leg: 1+  Pitting Edema present.  ?   Left lower leg: 1+ Pitting Edema present.  ?Neurological:  ?   Mental Status: She is alert.  ? ? ?Results for orders placed or performed during the hospital encounter of 04/01/22  ?Basic metabolic panel  ?Result Value Ref Range  ? Sodium 135 135 - 145 mmol/L  ? Potassium 4.2 3.5 - 5.1 mmol/L  ? Chloride 107 98 - 111 mmol/L  ? CO2 22 22 - 32 mmol/L  ? Glucose, Bld 99 70 - 99 mg/dL  ? BUN 13 8 - 23 mg/dL  ? Creatinine, Ser 0.79 0.44 - 1.00 mg/dL  ? Calcium 8.9 8.9 - 10.3 mg/dL  ? GFR, Estimated >60 >60 mL/min  ? Anion gap 6 5 - 15  ?Magnesium  ?Result Value Ref Range  ? Magnesium 2.1 1.7 - 2.4 mg/dL  ?Brain natriuretic peptide (order ONLY if patient c/o SOB)  ?Result Value Ref Range  ? B Natriuretic Peptide 696.7 (H) 0.0 - 100.0 pg/mL  ?CBC  ?Result Value Ref Range  ? WBC 9.8 4.0 - 10.5 K/uL  ? RBC 4.11 3.87 - 5.11 MIL/uL  ? Hemoglobin 11.2 (L) 12.0 - 15.0 g/dL  ? HCT 35.8 (L) 36.0 - 46.0 %  ? MCV  87.1 80.0 - 100.0 fL  ? MCH 27.3 26.0 - 34.0 pg  ? MCHC 31.3 30.0 - 36.0 g/dL  ? RDW 14.7 11.5 - 15.5 %  ? Platelets 272 150 - 400 K/uL  ? nRBC 0.0 0.0 - 0.2 %  ?Protime-INR  ?Result Value Ref Range  ? Prothrombin Time 27.7 (H) 11.4 - 15.2 seconds  ? INR 2.6 (H) 0.8 - 1.2  ?Results for orders placed or performed in visit on 04/01/22  ?POCT INR  ?Result Value Ref Range  ? INR 3.2 (A) 2.0 - 3.0  ? ? ? ?   ?Assessment & Plan:  ? ?Problem List Items Addressed This Visit   ? ?  ? Cardiovascular and Mediastinum  ? AF (atrial fibrillation) ([place])  ?  Patient with a history of A-fib currently managed by Dr. Ida Rogue.  On rate control drugs.  States that she has had atrial fibrillation with RVR in the past this feels different.  EKG after patient is sitting and resting for approximate 5 minutes showed heart rate at 140 and irregular.  Patient cannot walk 5 feet and get on exam table without being noticeably winded.  No history of heart failure per patient report.  After EKG is seen how little the  patient can move without being in distress did recommend patient go to the emergency department.  Patient eventually agreed with plan and disposition EMS was called and care handed over to paramedics. ? ?  ?  ? Atrial fibrillation with RVR ([place])  ?  EKG in office does show atrial fibrillation with RVR.  Patient also had extreme shortness of breath with walking approximately 5 feet. ? ?  ?  ?  ? Other  ? Shortness of breath - Primary  ?  Patient presents for shortness of breath.  She is recommended to see a provider at her Coumadin clinic appointment.  Patient extremely short of breath with less than 5 feet ambulation with help.  After ambulation heart rate in at least the 140s this was after patient had rested for 3 to 5 minutes.  Patient did have some edema and had an approximately 20 pound weight gain in 1 month.  Concern for new onset CHF.  After having conversation with patient felt emergent health care to get appropriate testing medications on board will be beneficial as she is becoming so short of breath with small movements albeit her oxygen level was stable.  Patient referred to emergency department EMS called and transported patient ? ?  ?  ? Relevant Orders  ? EKG 12-Lead (Completed)  ? ? ?No orders of the defined types were placed in this encounter. ? ? ?No follow-ups on file. ? ?Romilda Garret, NP ? ? ?

## 2022-04-01 NOTE — Progress Notes (Addendum)
Reduce dose today to only take 1/2  tablet and then change weekly dose to take 1 tablet daily except take 1 1/2 on Mondays. Recheck in 2 week.  ? ?Pt reports SOB and fluctuating BP x1 day. She will see a provider at the office today, Romilda Garret, NP. ?

## 2022-04-01 NOTE — ED Provider Notes (Signed)
? ?[facility] ?Provider Note ? ? ? Event Date/Time  ? First MD Initiated Contact with Patient 04/01/22 1109   ?  (approximate) ? ? ?History  ? ?Shortness of Breath ? ? ?HPI ? ?Julie Phillips is a 88 y.o. female with history of atrial fibs, hyperlipidemia, hypertension, LVH, and hypothyroidism presents to the emergency department with shortness of breath.  Patient was at her doctor's office and was sent here to the ED.  States that her heart rate was high at the doctor's office.  She is denying any chest pain.  States that she has had increasing shortness of breath over the weekend.  States she cannot walk from the bed to the door without getting very short of breath and feeling that she is going to pass out.  States she usually has to prop herself up on pillows because she feels that she cannot breathe.  Has gotten a little worse but not much over the weekend.  Does have swelling in her lower extremities.  Denies fever, chills ? ?  ? ? ?Physical Exam  ? ?Triage Vital Signs: ?ED Triage Vitals  ?Enc Vitals Group  ?   BP 04/01/22 1109 (!) 149/85  ?   Pulse Rate 04/01/22 1109 (!) 108  ?   Resp 04/01/22 1109 20  ?   Temp 04/01/22 1109 98.1 ?F (36.7 ?C)  ?   Temp Source 04/01/22 1109 Oral  ?   SpO2 04/01/22 1105 96 %  ?   Weight 04/01/22 1105 250 lb 6.4 oz (113.6 kg)  ?   Height 04/01/22 1105 '5\' 1"'$  (1.549 m)  ?   Head Circumference --   ?   Peak Flow --   ?   Pain Score 04/01/22 1105 0  ?   Pain Loc --   ?   Pain Edu? --   ?   Excl. in [place]? --   ? ? ?Most recent vital signs: ?Vitals:  ? 04/01/22 1200 04/01/22 1300  ?BP: (!) 145/94 (!) 156/82  ?Pulse: 97 (!) 110  ?Resp: 17 17  ?Temp:    ?SpO2: 97% 94%  ? ? ? ?General: Awake, no distress.   ?CV:  Good peripheral perfusion.  Irregular rate and rhythm, tachycardic  ?resp:  Normal effort. Lungs CTA ?Abd:  No distention.  ?  ?Other:   ?Lower extremities have 1+ edema, no tenderness in the calves ? ? ?ED Results / Procedures / Treatments  ? ?Labs ?(all labs  ordered are listed, but only abnormal results are displayed) ?Labs Reviewed  ?BRAIN NATRIURETIC PEPTIDE - Abnormal; Notable for the following components:  ?    Result Value  ? B Natriuretic Peptide 696.7 (*)   ? All other components within normal limits  ?CBC - Abnormal; Notable for the following components:  ? Hemoglobin 11.2 (*)   ? HCT 35.8 (*)   ? All other components within normal limits  ?PROTIME-INR - Abnormal; Notable for the following components:  ? Prothrombin Time 27.7 (*)   ? INR 2.6 (*)   ? All other components within normal limits  ?URINALYSIS, ROUTINE W REFLEX MICROSCOPIC - Abnormal; Notable for the following components:  ? Color, Urine STRAW (*)   ? APPearance HAZY (*)   ? Specific Gravity, Urine 1.004 (*)   ? Hgb urine dipstick SMALL (*)   ? Leukocytes,Ua TRACE (*)   ? Bacteria, UA RARE (*)   ? All other components within normal limits  ?RESP PANEL BY RT-PCR (  FLU A&B, COVID) [id number]  ?BASIC METABOLIC PANEL  ?MAGNESIUM  ?HEPATIC FUNCTION PANEL  ?TSH  ?TROPONIN I (HIGH SENSITIVITY)  ?TROPONIN I (HIGH SENSITIVITY)  ? ? ? ?EKG ? ?EKG ? ? ?RADIOLOGY ?Chest x-ray ? ? ? ?PROCEDURES: ? ? ?Marland KitchenCritical Care E&M ?Performed by: Versie Starks, PA-C ? ?Critical care provider statement:  ?  Critical care time (minutes):  50 ?  Critical care time was exclusive of:  Separately billable procedures and treating other patients ?  Critical care was necessary to treat or prevent imminent or life-threatening deterioration of the following conditions:  Cardiac failure ?  Critical care was time spent personally by me on the following activities:  Blood draw for specimens, development of treatment plan with patient or surrogate, evaluation of patient's response to treatment, examination of patient, obtaining history from patient or surrogate, ordering and performing treatments and interventions, ordering and review of laboratory studies, ordering and review of radiographic studies, pulse oximetry, re-evaluation of patient's  condition and review of old charts ?  Care discussed with: admitting provider   ?After initial E/M assessment, critical care services were subsequently performed that were exclusive of separately billable procedures or treatment.  ? ? ? ?MEDICATIONS ORDERED IN ED: ?Medications  ?losartan (COZAAR) tablet 100 mg (has no administration in time range)  ?metoprolol succinate (TOPROL-XL) 24 hr tablet 50 mg (has no administration in time range)  ?nitroGLYCERIN (NITROSTAT) SL tablet 0.4 mg (has no administration in time range)  ?levothyroxine (SYNTHROID) tablet 100 mcg (has no administration in time range)  ?pantoprazole (PROTONIX) EC tablet 40 mg (has no administration in time range)  ?vitamin B-12 (CYANOCOBALAMIN) tablet 1,000 mcg (has no administration in time range)  ?gabapentin (NEURONTIN) capsule 100 mg (has no administration in time range)  ?potassium chloride (KLOR-CON) CR tablet 10 mEq (has no administration in time range)  ?loratadine (CLARITIN) tablet 10 mg (has no administration in time range)  ?polyethylene glycol 0.4% and propylene glycol 0.3% (SYSTANE) ophthalmic gel (has no administration in time range)  ?sodium chloride flush (NS) 0.9 % injection 3 mL (has no administration in time range)  ?sodium chloride flush (NS) 0.9 % injection 3 mL (has no administration in time range)  ?0.9 %  sodium chloride infusion (has no administration in time range)  ?acetaminophen (TYLENOL) tablet 650 mg (has no administration in time range)  ?ondansetron (ZOFRAN) injection 4 mg (has no administration in time range)  ?furosemide (LASIX) injection 40 mg (has no administration in time range)  ?furosemide (LASIX) injection 40 mg (40 mg Intravenous Given 04/01/22 1227)  ?metoprolol tartrate (LOPRESSOR) injection 5 mg (5 mg Intravenous Given 04/01/22 1227)  ? ? ? ?IMPRESSION / MDM / ASSESSMENT AND PLAN / ED COURSE  ?I reviewed the triage vital signs and the nursing notes. ?             ?               ? ?Differential diagnosis  includes, but is not limited to, A-fib RVR, MI, CHF, PE, CAP, COVID ? ?Labs and imaging ordered. ? ?EKG shows atrial fibs with a rate of 114 ? ?CBC shows normal WBC, H&H are minimally depressed, INR is 2.6 which is therapeutic for the patient. ? ?BNP is elevated at 696.7, magnesium is normal ? ?We will plan on giving patient metoprolol 5 mg IV as her heart rate is still elevated and patient has not taken her metoprolol that she usually takes in the afternoon.  We will also  give her 40 mg of Lasix due to the CHF and pulmonary edema. ? ?Plan at this time is to admit.  Will await troponin prior to admission. ? ?Troponin and TSH are both normal. ? ?Consult hospitalist for admission ? ?Spoke with Dr.Agbata, she is admitting the patient for chf, afib rapid rate, and sob ? ?Patient is in stable condition at this time.  Heart rate has not decreased and is holding at 114 ? ? ? ? ?Clinical Course as of 04/01/22 1325  ?Thu Apr 01, 2022  ?1211 Troponin I (High Sensitivity) [SF]  ?  ?Clinical Course User Index ?[SF] Caryn Section Linden Dolin, PA-C  ? ? ? ?FINAL CLINICAL IMPRESSION(S) / ED DIAGNOSES  ? ?Final diagnoses:  ?Acute congestive heart failure, unspecified heart failure type ([place])  ?Shortness of breath  ?Atrial fibrillation, unspecified type ([place])  ? ? ? ?Rx / DC Orders  ? ?ED Discharge Orders   ? ? None  ? ?  ? ? ? ?Note:  This document was prepared using Dragon voice recognition software and may include unintentional dictation errors. ? ?  ?Versie Starks, PA-C ?04/01/22 1325 ? ?  ?Nance Pear, MD ?04/01/22 1415 ? ?

## 2022-04-01 NOTE — Assessment & Plan Note (Signed)
Continue losartan, diltiazem and metoprolol ?

## 2022-04-01 NOTE — Assessment & Plan Note (Signed)
Most likely rate related ?Patient noted to have elevated BNP levels and imaging suggestive of acute CHF ?Place patient on Lasix 40 mg IV daily ?Optimize blood pressure and rate control ?Maintain low-sodium diet ?Patient had a recent 2D echocardiogram from 03/23 which shows an LVEF of 65 to 70% with grade 2 diastolic dysfunction. ?We will request cardiology consult ?

## 2022-04-01 NOTE — H&P (Signed)
?History and Physical  ? ? ?Patient: Julie Phillips ASN:[id number] DOB: March 12, 1932 ?DOA: 04/01/2022 ?DOS: the patient was seen and examined on 04/01/2022 ?PCP: Tonia Ghent, MD  ?Patient coming from: Home ? ?Chief Complaint:  ?Chief Complaint  ?Patient presents with  ? Shortness of Breath  ? ?HPI: Julie Phillips is a 88 y.o. female with medical history significant for atrial fibrillation on chronic anticoagulation therapy, hypertension, hypothyroidism, morbid obesity (BMI 47.3 kg/m2) who presents to the ER for evaluation of increased fatigue, exertional shortness of breath, orthopnea and lower extremity swelling. ?Patient states that she has had symptoms for several days and had gone to her primary care provider's office to get her INR checked and was noted to be tachycardic with heart rate of 140bpm.  She was noticeably winded with minimal exertion and was referred to the ER for further evaluation.  Patient denies having a known history of CHF. ?She denies having any chest pain, no nausea, no vomiting, no diaphoresis, no palpitations, no abdominal pain, no cough, no fever, no chills, no urinary symptoms, no focal deficit or blurred vision. ? ?Review of Systems: As mentioned in the history of present illness. All other systems reviewed and are negative. ?Past Medical History:  ?Diagnosis Date  ? AF (atrial fibrillation) ([place])   ? Fall   ? GERD (gastroesophageal reflux disease)   ? History of chicken pox   ? Hx of colonic polyps   ? Hyperlipidemia   ? Hypertension   ? Hypothyroidism   ? Keratopathy 2018  ? R eye  ? LVH (left ventricular hypertrophy)   ? ?Past Surgical History:  ?Procedure Laterality Date  ? CARDIOVASCULAR STRESS TEST  2011  ? normal   ? keratopathy    ? ?Social History:  reports that she quit smoking about 50 years ago. Her smoking use included cigarettes. She has a 40.00 pack-year smoking history. She has never used smokeless tobacco. She reports that she does not currently use alcohol. She  reports that she does not use drugs. ? ?Allergies  ?Allergen Reactions  ? Statins   ?  Myalgias  ? Wheat   ?  Throat swelling  ? Amlodipine   ?  hematuria  ? Eucalyptus Oil   ? Lisinopril   ?  Unrecalled BP med- possibly lisinopril- intolerant, had cough on the medicine- changed to cozaar  ? ? ?Family History  ?Problem Relation Age of Onset  ? Heart disease Mother   ? Heart disease Father   ? Breast cancer Sister   ? Breast cancer Daughter   ? Colon cancer Neg Hx   ? ? ?Prior to Admission medications   ?Medication Sig Start Date End Date Taking? Authorizing Provider  ?diltiazem (CARDIZEM CD) 120 MG 24 hr capsule Take 1 capsule (120 mg total) by mouth daily. 03/31/22   Minna Merritts, MD  ?diphenhydrAMINE (BENADRYL) 25 mg capsule Take 1 capsule (25 mg total) by mouth daily as needed. 01/14/17   Tonia Ghent, MD  ?gabapentin (NEURONTIN) 100 MG capsule TAKE 1 CAPSULE BY MOUTH 3 TIMES DAILY ASNEEDED 01/29/21   Tonia Ghent, MD  ?levothyroxine (SYNTHROID) 100 MCG tablet TAKE 1 TABLET BY MOUTH ONCE A DAY BEFOREBREAKFAST. 07/12/21   Tonia Ghent, MD  ?loratadine (CLARITIN) 10 MG tablet Take 1 tablet (10 mg total) by mouth daily as needed. 01/14/17   Tonia Ghent, MD  ?losartan (COZAAR) 100 MG tablet Take 1 tablet (100 mg total) by mouth daily. 03/01/22  Minna Merritts, MD  ?metoprolol succinate (TOPROL-XL) 50 MG 24 hr tablet Take 1 tablet (50 mg total) by mouth every evening. 03/01/22   Minna Merritts, MD  ?metoprolol tartrate (LOPRESSOR) 50 MG tablet Take 1 tablet (50 mg total) by mouth once for 1 dose. Take 2 hours before your CCTA procedure 03/11/22 03/11/22  Kate Sable, MD  ?nitroGLYCERIN (NITROSTAT) 0.4 MG SL tablet Place 1 tablet (0.4 mg total) under the tongue every 5 (five) minutes as needed for chest pain. 01/01/22   Minna Merritts, MD  ?omeprazole (PRILOSEC) 20 MG capsule Take 1 capsule (20 mg total) by mouth daily. 07/10/21   Tonia Ghent, MD  ?Polyethyl Glycol-Propyl Glycol (SYSTANE)  0.4-0.3 % GEL ophthalmic gel Place 1 application into both eyes.    [provider]  ?potassium chloride (KLOR-CON) 10 MEQ tablet Take 1 tablet (10 mEq total) by mouth daily. 07/12/21   Tonia Ghent, MD  ?vitamin B-12 (CYANOCOBALAMIN) 1000 MCG tablet Take 1 tablet (1,000 mcg total) by mouth daily. 02/10/22   Jennye Boroughs, MD  ?warfarin (COUMADIN) 5 MG tablet TAKE ONE AND ONE-HALF TABLET BY MOUTH DAILY EXCEPT TAKE 1 TABLET ON MONDAYS AND THURSDAYS OR AS DIRECTED BY ANTICOAGULATION CLINIC 02/18/22   Tonia Ghent, MD  ? ? ?Physical Exam: ?Vitals:  ? 04/01/22 1105 04/01/22 1109 04/01/22 1200 04/01/22 1300  ?BP:  (!) 149/85 (!) 145/94 (!) 156/82  ?Pulse:  (!) 108 97 (!) 110  ?Resp:  '20 17 17  '$ ?Temp:  98.1 ?F (36.7 ?C)    ?TempSrc:  Oral    ?SpO2: 96% 96% 97% 94%  ?Weight: 113.6 kg     ?Height: '5\' 1"'$  (1.549 m)     ? ?Physical Exam ?Vitals and nursing note reviewed.  ?Constitutional:   ?   Appearance: She is obese.  ?HENT:  ?   Head: Normocephalic.  ?   Mouth/Throat:  ?   Mouth: Mucous membranes are moist.  ?Eyes:  ?   Pupils: Pupils are equal, round, and reactive to light.  ?Cardiovascular:  ?   Rate and Rhythm: Tachycardia present. Rhythm irregular.  ?Pulmonary:  ?   Effort: Pulmonary effort is normal.  ?   Breath sounds: Examination of the right-lower field reveals rales. Examination of the left-lower field reveals rales. Rales present.  ?Abdominal:  ?   General: Bowel sounds are normal.  ?   Palpations: Abdomen is soft.  ?Musculoskeletal:  ?   Cervical back: Normal range of motion and neck supple.  ?   Right lower leg: Edema present.  ?   Left lower leg: Edema present.  ?Skin: ?   General: Skin is warm and dry.  ?Neurological:  ?   General: No focal deficit present.  ?   Mental Status: She is alert.  ?Psychiatric:     ?   Mood and Affect: Mood normal.     ?   Behavior: Behavior normal.  ? ? ?Data Reviewed: ?Relevant notes from primary care and specialist visits, past discharge summaries as available in  EHR, including Care Everywhere. ?Prior diagnostic testing as pertinent to current admission diagnoses ?Updated medications and problem lists for reconciliation ?ED course, including vitals, labs, imaging, treatment and response to treatment ?Triage notes, nursing and pharmacy notes and ED provider's notes ?Notable results as noted in HPI ?Labs reviewed.  TSH 2.5, BNP 696, sodium 135, potassium 4.2, chloride 107, bicarb 22, glucose 99, BUN 13, creatinine 0.79, calcium 8.9, magnesium 2.1, INR 2.6, white  count 9.8, hemoglobin 11.2, hematocrit 35.8, RDW 17, platelet count 272 ?Chest x-ray reviewed by me shows findings suggestive of CHF with interstitial pulmonary edema. ?Twelve-lead EKG reviewed by me shows A-fib with rapid ventricular rate.  PVCs ?There are no new results to review at this time. ? ?Assessment and Plan: ?* Acute on chronic diastolic CHF (congestive heart failure) ([place]) ?Most likely rate related ?Patient noted to have elevated BNP levels and imaging suggestive of acute CHF ?Place patient on Lasix 40 mg IV daily ?Optimize blood pressure and rate control ?Maintain low-sodium diet ?Patient had a recent 2D echocardiogram from 03/23 which shows an LVEF of 65 to 70% with grade 2 diastolic dysfunction. ?We will request cardiology consult ? ?Chronic atrial fibrillation with RVR ([facility]) ?Patient with a history of A-fib who presents for evaluation of shortness of breath and is noted to be in rapid A-fib ?Continue metoprolol and diltiazem for rate control ?Continue warfarin as primary prophylaxis for an acute stroke. ?INR is therapeutic ? ?Essential hypertension ?Continue losartan, diltiazem and metoprolol ? ?Hypothyroid ?Stable ?Continue Synthroid ? ? ? ? ? Advance Care Planning:   Code Status: Full Code  ? ?Consults: Cardiology ? ?Family Communication: Greater than 50% of time was spent discussing patient's condition and plan of care with her at the bedside.  All questions and concerns have been addressed.  She  verbalizes understanding and agrees with the plan.  CODE STATUS was discussed and she is a full code. ? ?Severity of Illness: ?The appropriate patient status for this patient is INPATIENT. Inpatient status is j

## 2022-04-01 NOTE — Consult Note (Signed)
ANTICOAGULATION CONSULT NOTE - Initial Consult ? ?Pharmacy Consult for warfarin ?Indication: atrial fibrillation ? ?Allergies  ?Allergen Reactions  ? Statins   ?  Myalgias  ? Wheat   ?  Throat swelling  ? Amlodipine   ?  hematuria  ? Eucalyptus Oil   ? Lisinopril   ?  Unrecalled BP med- possibly lisinopril- intolerant, had cough on the medicine- changed to cozaar  ? ? ?Patient Measurements: ?Height: '5\' 1"'$  (154.9 cm) ?Weight: 113.6 kg (250 lb 6.4 oz) ?IBW/kg (Calculated) : 47.8 ? ? ?Vital Signs: ?Temp: 98.1 ?F (36.7 ?C) (04/20 1109) ?Temp Source: Oral (04/20 1109) ?BP: 156/82 (04/20 1300) ?Pulse Rate: 110 (04/20 1300) ? ?Labs: ?Recent Labs  ?  04/01/22 ?0000 04/01/22 ?1110  ?HGB  --  11.2*  ?HCT  --  35.8*  ?PLT  --  272  ?LABPROT  --  27.7*  ?INR 3.2* 2.6*  ?CREATININE  --  0.79  ?TROPONINIHS  --  7  ? ? ?Estimated Creatinine Clearance: 58 mL/min (by C-G formula based on SCr of 0.79 mg/dL). ? ? ?Medical History: ?Past Medical History:  ?Diagnosis Date  ? AF (atrial fibrillation) ([place])   ? Fall   ? GERD (gastroesophageal reflux disease)   ? History of chicken pox   ? Hx of colonic polyps   ? Hyperlipidemia   ? Hypertension   ? Hypothyroidism   ? Keratopathy 2018  ? R eye  ? LVH (left ventricular hypertrophy)   ? ? ?Medications:  ?Warfarin PTA regimen as of 04/01/22 ?7.5 mg (5 mg x 1.5) every Mon; 5 mg (5 mg x 1) all other days ?TWD = 37.5 mg ? ?Assessment: ?88 year old female with PHM of afib on warfarin presented [facility] 04/01/22 with SOB, Afib w/ RVR. Patient seen in outpatient anticoagulation clinic 04/01/22 AM with supratherapeutic INR (3.2) and instructed to take 2.5 mg dose x 1 and updated total weekly regimen as above. In ED INR therapeutic (2.6). Pharmacy consulted to manage warfarin while patient admitted.  ? ?DDIs: reviewed  ? ?Date INR Warfarin Dose  ?4/20 3.2  2.5 mg ( patient instructed to take 1/2 dose by anticoagulation clinic prior to arrival) ?4/20 2.6 Inpatient INR value ~ 2 hours following POCT INR  result of 3.2 ? ?Goal of Therapy:  ?INR 2-3 ?Monitor platelets by anticoagulation protocol: Yes ?  ?Plan:  ?Patient already taken 2.5 mg dose as instructed by anti-coagulation clinic 4/20 AM PTA ?Will withhold further warfarin dose tonight ?INR daily  ? ?Dorothe Pea, PharmD, BCPS ?Clinical Pharmacist   ?04/01/2022,1:41 PM ? ? ?

## 2022-04-01 NOTE — Plan of Care (Signed)
?  Problem: Education: ?Goal: Knowledge of General Education information will improve ?Description: Including pain rating scale, medication(s)/side effects and non-pharmacologic comfort measures ?Outcome: Progressing ?  ?Problem: Clinical Measurements: ?Goal: Ability to maintain clinical measurements within normal limits will improve ?Outcome: Progressing ?  ?Problem: Clinical Measurements: ?Goal: Respiratory complications will improve ?Outcome: Progressing ?  ?Problem: Clinical Measurements: ?Goal: Cardiovascular complication will be avoided ?Outcome: Progressing ?  ?Problem: Activity: ?Goal: Risk for activity intolerance will decrease ?Outcome: Progressing ?  ?Problem: Pain Managment: ?Goal: General experience of comfort will improve ?Outcome: Progressing ?  ?Problem: Safety: ?Goal: Ability to remain free from injury will improve ?Outcome: Progressing ?  ?

## 2022-04-01 NOTE — Assessment & Plan Note (Signed)
Patient presents for shortness of breath.  She is recommended to see a provider at her Coumadin clinic appointment.  Patient extremely short of breath with less than 5 feet ambulation with help.  After ambulation heart rate in at least the 140s this was after patient had rested for 3 to 5 minutes.  Patient did have some edema and had an approximately 20 pound weight gain in 1 month.  Concern for new onset CHF.  After having conversation with patient felt emergent health care to get appropriate testing medications on board will be beneficial as she is becoming so short of breath with small movements albeit her oxygen level was stable.  Patient referred to emergency department EMS called and transported patient ?

## 2022-04-01 NOTE — Patient Instructions (Addendum)
Pre visit review using our clinic review tool, if applicable. No additional management support is needed unless otherwise documented below in the visit note. ? ?Reduce dose today to only take 1/2  tablet and then change weekly dose to take 1 tablet daily except take 1 1/2 on Mondays. Recheck in 2 week.  ?

## 2022-04-01 NOTE — Assessment & Plan Note (Signed)
Patient with a history of A-fib who presents for evaluation of shortness of breath and is noted to be in rapid A-fib ?Continue metoprolol and diltiazem for rate control ?Continue warfarin as primary prophylaxis for an acute stroke. ?INR is therapeutic ?

## 2022-04-02 ENCOUNTER — Inpatient Hospital Stay: Payer: PPO | Admitting: Registered Nurse

## 2022-04-02 ENCOUNTER — Encounter
Admission: EM | Disposition: A | Discharge status: Home / Self Care | Payer: Self-pay | Source: Home / Self Care | Attending: Emergency Medicine

## 2022-04-02 ENCOUNTER — Encounter: Payer: Self-pay | Admitting: Internal Medicine

## 2022-04-02 DIAGNOSIS — R0602 Shortness of breath: Secondary | ICD-10-CM

## 2022-04-02 DIAGNOSIS — I4891 Unspecified atrial fibrillation: Secondary | ICD-10-CM | POA: Diagnosis present

## 2022-04-02 DIAGNOSIS — I5033 Acute on chronic diastolic (congestive) heart failure: Secondary | ICD-10-CM | POA: Diagnosis not present

## 2022-04-02 DIAGNOSIS — E785 Hyperlipidemia, unspecified: Secondary | ICD-10-CM | POA: Diagnosis not present

## 2022-04-02 DIAGNOSIS — I10 Essential (primary) hypertension: Secondary | ICD-10-CM | POA: Diagnosis not present

## 2022-04-02 DIAGNOSIS — I4819 Other persistent atrial fibrillation: Secondary | ICD-10-CM

## 2022-04-02 DIAGNOSIS — I4811 Longstanding persistent atrial fibrillation: Secondary | ICD-10-CM

## 2022-04-02 HISTORY — PX: CARDIOVERSION: SHX1299

## 2022-04-02 LAB — CBC
HCT: 33.9 % — ABNORMAL LOW (ref 36.0–46.0)
Hemoglobin: 10.8 g/dL — ABNORMAL LOW (ref 12.0–15.0)
MCH: 27.4 pg (ref 26.0–34.0)
MCHC: 31.9 g/dL (ref 30.0–36.0)
MCV: 86 fL (ref 80.0–100.0)
Platelets: 257 10*3/uL (ref 150–400)
RBC: 3.94 MIL/uL (ref 3.87–5.11)
RDW: 14.7 % (ref 11.5–15.5)
WBC: 10.7 10*3/uL — ABNORMAL HIGH (ref 4.0–10.5)
nRBC: 0 % (ref 0.0–0.2)

## 2022-04-02 LAB — BASIC METABOLIC PANEL
Anion gap: 8 (ref 5–15)
BUN: 19 mg/dL (ref 8–23)
CO2: 25 mmol/L (ref 22–32)
Calcium: 8.9 mg/dL (ref 8.9–10.3)
Chloride: 103 mmol/L (ref 98–111)
Creatinine, Ser: 0.87 mg/dL (ref 0.44–1.00)
GFR, Estimated: >60 mL/min (ref >60)
Glucose, Bld: 117 mg/dL — ABNORMAL HIGH (ref 70–99)
Potassium: 3.9 mmol/L (ref 3.5–5.1)
Sodium: 136 mmol/L (ref 135–145)

## 2022-04-02 LAB — PROTIME-INR
INR: 2.3 — ABNORMAL HIGH (ref 0.8–1.2)
INR: 2.1 — ABNORMAL HIGH (ref 0.8–1.2)
Prothrombin Time: 24.9 seconds — ABNORMAL HIGH (ref 11.4–15.2)
Prothrombin Time: 23.3 seconds — ABNORMAL HIGH (ref 11.4–15.2)

## 2022-04-02 SURGERY — CARDIOVERSION
Anesthesia: General

## 2022-04-02 MED ORDER — AMIODARONE HCL 200 MG PO TABS: 200.0000 mg | ORAL_TABLET | Freq: Two times a day (BID) | ORAL | 0 refills | Status: DC

## 2022-04-02 MED ORDER — AMIODARONE HCL 400 MG PO TABS: 400.0000 mg | ORAL_TABLET | Freq: Two times a day (BID) | ORAL | 0 refills | Status: DC

## 2022-04-02 MED ORDER — FUROSEMIDE 20 MG PO TABS: 20.0000 mg | ORAL_TABLET | Freq: Every day | ORAL | 0 refills | Status: DC

## 2022-04-02 MED ORDER — PROPOFOL 10 MG/ML IV BOLUS
INTRAVENOUS | Status: DC | PRN
  Administered 2022-04-02: 60 mg via INTRAVENOUS

## 2022-04-02 MED ORDER — POTASSIUM CHLORIDE CRYS ER 20 MEQ PO TBCR
20.0000 meq | EXTENDED_RELEASE_TABLET | Freq: Every day | ORAL | Status: DC
  Administered 2022-04-02: 20 meq via ORAL
  Filled 2022-04-02: qty 1

## 2022-04-02 MED ORDER — SODIUM CHLORIDE 0.9 % IV SOLN: INTRAVENOUS | Status: DC

## 2022-04-02 MED ORDER — AMIODARONE HCL 200 MG PO TABS
400.0000 mg | ORAL_TABLET | Freq: Two times a day (BID) | ORAL | Status: DC
  Administered 2022-04-02: 400 mg via ORAL
  Filled 2022-04-02 (×2): qty 2

## 2022-04-02 MED ORDER — METOPROLOL SUCCINATE ER 50 MG PO TB24
50.0000 mg | ORAL_TABLET | Freq: Two times a day (BID) | ORAL | Status: DC
Start: 2022-04-02 — End: 2022-05-31

## 2022-04-02 MED ORDER — WARFARIN SODIUM 5 MG PO TABS
5.0000 mg | ORAL_TABLET | Freq: Once | ORAL | Status: AC
  Administered 2022-04-02: 5 mg via ORAL
  Filled 2022-04-02: qty 1

## 2022-04-02 NOTE — Anesthesia Preprocedure Evaluation (Signed)
Anesthesia Evaluation  ?Patient identified by MRN, date of birth, ID band ?Patient awake ? ? ? ?Reviewed: ?Allergy & Precautions, H&P , NPO status , Patient's Chart, lab work & pertinent test results, reviewed documented beta blocker date and time  ? ?Airway ?Mallampati: III ? ?TM Distance: >3 FB ?Neck ROM: full ? ? ? Dental ? ?(+) Dental Advidsory Given, Edentulous Upper, Upper Dentures, Poor Dentition ?  ?Pulmonary ?shortness of breath (from a fib), neg sleep apnea, neg COPD, neg recent URI, former smoker,  ?  ?Pulmonary exam normal ?breath sounds clear to auscultation ? ? ? ? ? ? Cardiovascular ?Exercise Tolerance: Good ?hypertension, (-) angina+CHF  ?(-) Past MI and (-) Cardiac Stents + dysrhythmias Atrial Fibrillation (-) Valvular Problems/Murmurs ?Rhythm:regular Rate:Normal ? ? ?  ?Neuro/Psych ?negative neurological ROS ? negative psych ROS  ? GI/Hepatic ?Neg liver ROS, GERD  ,  ?Endo/Other  ?neg diabetesHypothyroidism  ? Renal/GU ?negative Renal ROS  ?negative genitourinary ?  ?Musculoskeletal ? ? Abdominal ?  ?Peds ? Hematology ?negative hematology ROS ?(+)   ?Anesthesia Other Findings ?Past Medical History: ?No date: AF (atrial fibrillation) ([place]) ?No date: Fall ?No date: GERD (gastroesophageal reflux disease) ?No date: History of chicken pox ?No date: Hx of colonic polyps ?No date: Hyperlipidemia ?No date: Hypertension ?No date: Hypothyroidism ?2018: Keratopathy ?    Comment:  R eye ?No date: LVH (left ventricular hypertrophy) ? ? Reproductive/Obstetrics ?negative OB ROS ? ?  ? ? ? ? ? ? ? ? ? ? ? ? ? ?  ?  ? ? ? ? ? ? ? ? ?Anesthesia Physical ?Anesthesia Plan ? ?ASA: 3 ? ?Anesthesia Plan: General  ? ?Post-op Pain Management:   ? ?Induction: Intravenous ? ?PONV Risk Score and Plan: Propofol infusion and TIVA ? ?Airway Management Planned: Natural Airway and Nasal Cannula ? ?Additional Equipment:  ? ?Intra-op Plan:  ? ?Post-operative Plan:  ? ?Informed Consent: I have  reviewed the patients History and Physical, chart, labs and discussed the procedure including the risks, benefits and alternatives for the proposed anesthesia with the patient or authorized representative who has indicated his/her understanding and acceptance.  ? ? ? ?Dental Advisory Given ? ?Plan Discussed with: Anesthesiologist, CRNA and Surgeon ? ?Anesthesia Plan Comments:   ? ? ? ? ? ? ?Anesthesia Quick Evaluation ? ?

## 2022-04-02 NOTE — Consult Note (Addendum)
ANTICOAGULATION CONSULT NOTE - Initial Consult ? ?Pharmacy Consult for warfarin ?Indication: atrial fibrillation ? ?Allergies  ?Allergen Reactions  ? Statins   ?  Myalgias  ? Wheat   ?  Throat swelling  ? Amlodipine   ?  hematuria  ? Eucalyptus Oil   ? Lisinopril   ?  Unrecalled BP med- possibly lisinopril- intolerant, had cough on the medicine- changed to cozaar  ? ? ?Patient Measurements: ?Height: '5\' 1"'$  (154.9 cm) ?Weight: (!) 231.4 kg (510 lb 2.3 oz) ?IBW/kg (Calculated) : 47.8 ? ? ?Vital Signs: ?Temp: 97.6 ?F (36.4 ?C) (04/21 0500) ?Temp Source: Oral (04/20 2200) ?BP: 143/66 (04/21 0438) ?Pulse Rate: 68 (04/21 0414) ? ?Labs: ?Recent Labs  ?  04/01/22 ?0000 04/01/22 ?1110 04/01/22 ?1459 04/02/22 ?0357  ?HGB  --  11.2*  --  10.8*  ?HCT  --  35.8*  --  33.9*  ?PLT  --  272  --  257  ?LABPROT  --  27.7*  --  24.9*  ?INR 3.2* 2.6*  --  2.3*  ?CREATININE  --  0.79  --  0.87  ?TROPONINIHS  --  7 7  --   ? ? ? ?Estimated Creatinine Clearance: 87.2 mL/min (by C-G formula based on SCr of 0.87 mg/dL). ? ? ?Medical History: ?Past Medical History:  ?Diagnosis Date  ? AF (atrial fibrillation) ([place])   ? Fall   ? GERD (gastroesophageal reflux disease)   ? History of chicken pox   ? Hx of colonic polyps   ? Hyperlipidemia   ? Hypertension   ? Hypothyroidism   ? Keratopathy 2018  ? R eye  ? LVH (left ventricular hypertrophy)   ? ? ?Medications:  ?Warfarin PTA regimen as of 04/01/22 ?7.5 mg (5 mg x 1.5) every Mon; 5 mg (5 mg x 1) all other days ?TWD = 37.5 mg ? ?Assessment: ?88 year old female with PHM of afib on warfarin presented [facility]01/22 with SOB, Afib w/ RVR. Patient seen in outpatient anticoagulation clinic 04/01/22 AM with supratherapeutic INR (3.2) and instructed to take 2.5 mg dose x 1 and updated total weekly regimen as above. In ED INR therapeutic (2.6). Pharmacy consulted to manage warfarin while patient admitted.  ? ?DDIs: amiodarone gtt  ? ?Date INR Warfarin Dose  ?4/20 3.2  2.5 mg (patient instructed to take 1/2  dose by anticoagulation clinic PTA) ?4/20 2.6 Inpatient INR value ~ 2 hours following POCT INR result of 3.2 ?4/21 2.3 '5mg'$  tonight ? ?Goal of Therapy:  ?INR 2-3 ?Monitor platelets by anticoagulation protocol: Yes ?  ?Plan: ?Pt continues on amio gtt @ '30mg'$ /h. INR 2.6>2.3 after 2.'5mg'$  yesterday taken PTA(per advice of anticoag clinic ) ?Will resume with '5mg'$  of warfarin tonight given current INR is therapeutic ?CTM INR daily  ? ?Lorna Dibble, PharmD, BCPS ?Clinical Pharmacist   ?04/02/2022,7:27 AM ? ? ?

## 2022-04-02 NOTE — TOC Initial Note (Signed)
Transition of Care (TOC) - Initial/Assessment Note  ? ? ?Patient Details  ?Name: Julie Phillips ?MRN: [id number] ?Date of Birth: Apr 05, 1932 ? ?Transition of Care (TOC) CM/SW Contact:    ?Laurena Slimmer, RN ?Phone Number: ?04/02/2022, 2:59 PM ? ?Clinical Narrative:                 ? Per TOC note 01/2022 ? ?"Patient lives alone at [place].  She walks with a cane or uses her rollator, she drives.  Patient is current with her PCP and Dr. Rockey Situ is her cardiologist." ? ?Awaiting PT/OT eval to determine needs for home.    ? ?  ?  ? ? ?Patient Goals and CMS Choice ?  ?  ?  ? ?Expected Discharge Plan and Services ?  ?  ?  ?  ?  ?                ?  ?  ?  ?  ?  ?  ?  ?  ?  ?  ? ?Prior Living Arrangements/Services ?  ?  ?  ?       ?  ?  ?  ?  ? ?Activities of Daily Living ?  ?ADL Screening (condition at time of admission) ?Patient's cognitive ability adequate to safely complete daily activities?: Yes ?Is the patient deaf or have difficulty hearing?: Yes ?Does the patient have difficulty seeing, even when wearing glasses/contacts?: No ?Does the patient have difficulty concentrating, remembering, or making decisions?: No ?Patient able to express need for assistance with ADLs?: Yes ?Does the patient have difficulty dressing or bathing?: No ?Independently performs ADLs?: Yes (appropriate for developmental age) ?Does the patient have difficulty walking or climbing stairs?: Yes ?Weakness of Legs: None ?Weakness of Arms/Hands: None ? ?Permission Sought/Granted ?  ?  ?   ?   ?   ?   ? ?Emotional Assessment ?  ?  ?  ?  ?  ?  ? ?Admission diagnosis:  Shortness of breath [R06.02] ?Acute on chronic diastolic CHF (congestive heart failure) ([place]) [I50.33] ?Atrial fibrillation, unspecified type ([place]) [I48.91] ?Acute congestive heart failure, unspecified heart failure type ([place]) [I50.9] ?Patient Active Problem List  ? Diagnosis Date Noted  ? Acute on chronic diastolic CHF (congestive heart failure) ([place]) 04/01/2022  ? Atrial  fibrillation with RVR ([place]) 02/08/2022  ? Elevated troponin 02/08/2022  ? Tremor of both hands 07/12/2021  ? GERD (gastroesophageal reflux disease) 07/18/2019  ? Hyperglycemia 07/09/2018  ? Vitamin D deficiency 07/09/2018  ? Sciatica of left side 11/13/2017  ? Umbilical abnormality 47/08/6282  ? Healthcare maintenance 06/28/2017  ? Toenail avulsion 06/28/2017  ? DNR (do not resuscitate) 06/27/2017  ? Band keratopathy 01/16/2017  ? Risk for falls 06/21/2016  ? Rotator cuff disorder 12/10/2015  ? Shortness of breath 12/10/2015  ? Osteopenia 02/13/2015  ? Advance care planning 01/15/2015  ? Aspiration into airway 10/08/2013  ? Food allergy 07/09/2013  ? Medicare annual wellness visit, subsequent 12/26/2012  ? Knee pain 07/20/2012  ? Rash 04/11/2012  ? Hypothyroid 04/11/2012  ? LVH (left ventricular hypertrophy) 01/17/2012  ? Chronic atrial fibrillation with RVR ([place]) 01/06/2012  ? Obesity 01/06/2012  ? Essential hypertension 01/06/2012  ? Mixed hyperlipidemia 01/06/2012  ? ?PCP:  Tonia Ghent, MD ?Pharmacy:   ?[facility] [place], [address] ?[place] [address] ?Phone: [phone] Fax: [phone] ? ?[place] ?[place] [address] ?Phone: [phone] Fax: [phone] ? ? ? ? ?Social Determinants of Health (SDOH) Interventions ?  ? ?Readmission Risk Interventions ?   ? View : No data to display.  ?  ?  ?  ? ? ? ?

## 2022-04-02 NOTE — Progress Notes (Signed)
Cardioversion successful in restoring normal sinus rhythm ?We will start oral amiodarone 400 twice daily for 5 days then down to 200 twice daily ?We will discontinue the amiodarone infusion ?-Continue diltiazem extended release 120 daily with metoprolol succinate 50 twice daily ?-Lasix 20 mg daily at discharge ? ?Signed, ?Esmond Plants, MD, Ph.D ?[facility] ? ?

## 2022-04-02 NOTE — Progress Notes (Signed)
? ? ?Progress Note ? ?Patient Name: Julie Phillips ?Date of Encounter: 04/02/2022 ? ?Primary Cardiologist: Rockey Situ ? ?Subjective  ? ?Continues to note some dyspnea. Anxious overnight. No chest pain. She remains in Afib with ventricular rates in the low 100s bpm. Ins and outs are not accurate with a documented UOP 184 mL for the past 24 hours and since admission. Weight trend not accurate. Renal function normal.  ? ?Inpatient Medications  ?  ?Scheduled Meds: ? diltiazem  120 mg Oral Daily  ? furosemide  40 mg Intravenous Daily  ? levothyroxine  100 mcg Oral Q0600  ? losartan  100 mg Oral Daily  ? metoprolol succinate  50 mg Oral BID  ? pantoprazole  40 mg Oral Daily  ? potassium chloride SA  10 mEq Oral Daily  ? sodium chloride flush  3 mL Intravenous Q12H  ? vitamin B-12  1,000 mcg Oral Daily  ? Warfarin - Pharmacist Dosing Inpatient   Does not apply [id number]  ? ?Continuous Infusions: ? sodium chloride    ? amiodarone 30 mg/hr (04/02/22 0133)  ? ?PRN Meds: ?sodium chloride, acetaminophen, gabapentin, loratadine, nitroGLYCERIN, ondansetron (ZOFRAN) IV, polyvinyl alcohol, sodium chloride flush  ? ?Vital Signs  ?  ?Vitals:  ? 04/02/22 0414 04/02/22 0438 04/02/22 0500 04/02/22 0729  ?BP: 120/82 (!) 143/66  138/81  ?Pulse: 68   100  ?Resp: '16 16  16  '$ ?Temp: 98.1 ?F (36.7 ?C) (!) 97.5 ?F (36.4 ?C) 97.6 ?F (36.4 ?C) 97.9 ?F (36.6 ?C)  ?TempSrc:      ?SpO2: 91% 96%  93%  ?Weight:   (!) 231.4 kg   ?Height:      ? ? ?Intake/Output Summary (Last 24 hours) at 04/02/2022 0731 ?Last data filed at 04/02/2022 0700 ?Gross per 24 hour  ?Intake 215.99 ml  ?Output 400 ml  ?Net -184.01 ml  ? ?Filed Weights  ? 04/01/22 1105 04/02/22 0500  ?Weight: 113.6 kg (!) 231.4 kg  ? ? ?Telemetry  ?  ?Afib with ventricular rates in the low 100s bpm - Personally Reviewed ? ?ECG  ?  ?No new tracings - Personally Reviewed ? ?Physical Exam  ? ?GEN: No acute distress.   ?Neck: No JVD. ?Cardiac: Mildly tachycardic, IRIR, no murmurs, rubs, or gallops.   ?Respiratory: Clear to auscultation bilaterally.  ?GI: Soft, nontender, non-distended.   ?MS: Mild lower extremity edema; No deformity. ?Neuro:  Alert and oriented x 3; Nonfocal.  ?Psych: Normal affect. ? ?Labs  ?  ?Chemistry ?Recent Labs  ?Lab 04/01/22 ?1110 04/02/22 ?5681  ?NA 135 136  ?K 4.2 3.9  ?CL 107 103  ?CO2 22 25  ?GLUCOSE 99 117*  ?BUN 13 19  ?CREATININE 0.79 0.87  ?CALCIUM 8.9 8.9  ?PROT 7.4  --   ?ALBUMIN 3.5  --   ?AST 15  --   ?ALT 10  --   ?ALKPHOS 91  --   ?BILITOT 0.8  --   ?GFRNONAA >60 >60  ?ANIONGAP 6 8  ?  ? ?Hematology ?Recent Labs  ?Lab 04/01/22 ?1110 04/02/22 ?2751  ?WBC 9.8 10.7*  ?RBC 4.11 3.94  ?HGB 11.2* 10.8*  ?HCT 35.8* 33.9*  ?MCV 87.1 86.0  ?MCH 27.3 27.4  ?MCHC 31.3 31.9  ?RDW 14.7 14.7  ?PLT 272 257  ? ? ?Cardiac EnzymesNo results for input(s): TROPONINI in the last 168 hours. No results for input(s): TROPIPOC in the last 168 hours.  ? ?BNP ?Recent Labs  ?Lab 04/01/22 ?1110  ?BNP 696.7*  ?  ? ?  DDimer No results for input(s): DDIMER in the last 168 hours.  ? ?Radiology  ?  ?DG Chest Portable 1 View ? ?Result Date: 04/01/2022 ?IMPRESSION: Findings suggestive of CHF with interstitial pulmonary edema. Electronically Signed   By: Dahlia Bailiff M.D.   On: 04/01/2022 11:54   ? ?Cardiac Studies  ? ?2D echo 02/10/2022: ?1. Left ventricular ejection fraction, by estimation, is 65 to 70%. The  ?left ventricle has normal function. The left ventricle has no regional  ?wall motion abnormalities. There is mild left ventricular hypertrophy.  ?Left ventricular diastolic parameters  ?are consistent with Grade II diastolic dysfunction (pseudonormalization).  ? 2. Right ventricular systolic function is normal. The right ventricular  ?size is normal.  ? 3. Left atrial size was mildly dilated.  ? 4. The mitral valve is degenerative. Mild mitral valve regurgitation.  ?Moderate mitral annular calcification.  ? 5. The aortic valve was not well visualized. Aortic valve regurgitation  ?is not visualized.  Aortic valve sclerosis/calcification is present,  ?without any evidence of aortic stenosis.  ? 6. The inferior vena cava is normal in size with greater than 50%  ?respiratory variability, suggesting right atrial pressure of 3 mmHg. ? ?Patient Profile  ?   ?88 y.o. female with history of persistent A-fib, HTN, HLD, hypothyroidism, frequent falls, and obesity who is being seen today for the evaluation of Afib with RVR and volume overload at the request of Dr. Francine Graven. ? ?Assessment & Plan  ?  ?1. Acute on chronic HFpEF: ?-Likely in the setting of persistent Afib with RVR ?-IV Lasix 40 mg daily with close monitoring of renal function and UOP ?-Strict I/O and daily weights, appear inaccurate  ?  ?2. Persistent Afib with RVR: ?-It appears her A-fib dates back at least to 01/2022 ?-INR has been therapeutic since 02/18/2022 ?-Plan forDCCV later today ?-Continue IV amiodarone on higher dose ?-Continue titrated dose of Toprol XL to 50 mg twice daily ?-CHADS2VASc at least 5 (CHF, HTN, age x2, sex category) ?-Warfarin per pharmacy  ?  ?3.  History of chest pain: ?-High-sensitivity troponin negative so far, await delta troponin ?-Once she has been restored to normal sinus rhythm, consider repeat attempt at coronary CTA, as she prefers to avoid Lexiscan MPI ?-Warfarin in place of aspirin to minimize bleeding risk ? ? ?Shared Decision Making/Informed Consent{ ? ?The risks (stroke, cardiac arrhythmias rarely resulting in the need for a temporary or permanent pacemaker, skin irritation or burns and complications associated with conscious sedation including aspiration, arrhythmia, respiratory failure and death), benefits (restoration of normal sinus rhythm) and alternatives of a direct current cardioversion were explained in detail to Julie Phillips and she agrees to proceed.  ?  ? ?For questions or updates, please contact [place] ?Please consult [email or web address] for contact info under Cardiology/STEMI. ?  ? ?Signed, ?Christell Faith,  PA-C ?[facility] ?Pager: [phone] ?04/02/2022, 7:31 AM ? ?

## 2022-04-02 NOTE — Care Management Obs Status (Signed)
MEDICARE OBSERVATION STATUS NOTIFICATION ? ? ?Patient Details  ?Name: Julie Phillips ?MRN: [id number] ?Date of Birth: 14-Sep-1932 ? ? ?Medicare Observation Status Notification Given:  Yes ? ? ? Laurena Slimmer, RN ?04/02/2022, 5:23 PM ?

## 2022-04-02 NOTE — Transfer of Care (Signed)
Immediate Anesthesia Transfer of Care Note ? ?Patient: Julie Phillips ? ?Procedure(s) Performed: CARDIOVERSION ? ?Patient Location: Special Procedures ? ?Anesthesia Type:General ? ?Level of Consciousness: drowsy ? ?Airway & Oxygen Therapy: Patient Spontanous Breathing and Patient connected to nasal cannula oxygen ? ?Post-op Assessment: Report given to RN and Post -op Vital signs reviewed and stable ? ?Post vital signs: Reviewed and stable ? ?Last Vitals:  ?Vitals Value Taken Time  ?BP 109/57 04/02/22 1306  ?Temp    ?Pulse 54 04/02/22 1308  ?Resp 13 04/02/22 1308  ?SpO2 94 % 04/02/22 1308  ?Vitals shown include unvalidated device data. ? ?Last Pain:  ?Vitals:  ? 04/02/22 1236  ?TempSrc: Oral  ?PainSc: 0-No pain  ?   ? ?  ? ?Complications: No notable events documented. ?

## 2022-04-02 NOTE — Care Management CC44 (Signed)
Condition Code 44 Documentation Completed ? ?Patient Details  ?Name: Julie Phillips ?MRN: [id number] ?Date of Birth: July 11, 1932 ? ? ?Condition Code 44 given:  Yes ?Patient signature on Condition Code 44 notice:  Yes ?Documentation of 2 MD's agreement:  Yes ?Code 44 added to claim:  Yes ? ? ? Laurena Slimmer, RN ?04/02/2022, 5:23 PM ? ?

## 2022-04-02 NOTE — Consult Note (Signed)
? ?  Heart Failure Nurse Navigator Note ? ?HFpEF 65 to 70%.  Mild LVH.  Grade 2 diastolic dysfunction.  Mild mitral regurgitation. ? ?She presented to the emergency room with complaints of shortness of breath with minimal exertion and tachycardia. ? ?Comorbidities: ? ?Atrial fibrillation ?Hypertension ?Hypothyroid ?Morbid obesity ? ?Medications: ? ?Amiodarone infusion ?Diltiazem 120 mg daily ?Furosemide 40 mg IV daily ?Losartan 100 mg daily ?Metoprolol succinate 50 mg 2 times a day ?Potassium chloride 20 mEq daily ?Warfarin ? ?Labs: ? ?Sodium 136, potassium 3.9, chloride 103, CO2 25, BUN 19, creatinine 0.87, GFR greater than 60, BNP 696. ?Weight is 105 kg ?Blood pressure 106/84 ?Intake 216 mL ?Output 400 mL ? ?Initial meeting with patient and her daughter who was at the bedside.  She states that she has heard of heart failure before especially in regards to her mother.  Gust the meaning of heart failure and the type of heart failure that she has.  She voices understanding ? ?She states that she does not use salt at the table.  She has a scale but that she does not weigh herself daily. ? ?Talked about sticking with a low-sodium diet less than 2000 mg, fluid restriction of less than 64 ounces in a 24-hour period and signs and symptoms to report to physician. ? ?Discussed follow-up in the outpatient heart failure clinic for which she has an appointment on May 4 at 11 AM.  She has a no-show of 3% which is 2 out of 80 appointments. ? ?He was given the living with heart failure teaching booklet, zone magnet, information on low-sodium and heart failure. ? ?Pricilla Riffle RN [facility] ? ?

## 2022-04-02 NOTE — Anesthesia Postprocedure Evaluation (Signed)
Anesthesia Post Note ? ?Patient: Julie Phillips ? ?Procedure(s) Performed: CARDIOVERSION ? ?Patient location during evaluation: [facility] ?Anesthesia Type: General ?Level of consciousness: awake and alert ?Pain management: pain level controlled ?Vital Signs Assessment: post-procedure vital signs reviewed and stable ?Respiratory status: spontaneous breathing, nonlabored ventilation, respiratory function stable and patient connected to nasal cannula oxygen ?Cardiovascular status: blood pressure returned to baseline and stable ?Postop Assessment: no apparent nausea or vomiting ?Anesthetic complications: no ? ? ?No notable events documented. ? ? ?Last Vitals:  ?Vitals:  ? 04/02/22 1345 04/02/22 1350  ?BP: 93/76 106/84  ?Pulse: (!) 56 (!) 55  ?Resp: 12 16  ?Temp:    ?SpO2: 96% 97%  ?  ?Last Pain:  ?Vitals:  ? 04/02/22 1345  ?TempSrc:   ?PainSc: 0-No pain  ? ? ?  ?  ?  ?  ?  ?  ? ?Martha Clan ? ? ? ? ?

## 2022-04-02 NOTE — Discharge Summary (Signed)
Physician Discharge Summary  ?Julie Phillips GEX:[id number] DOB: [birth year] DOA: 04/01/2022 ? ?PCP: Tonia Ghent, MD ? ?Admit date: 04/01/2022 ?Discharge date: 04/02/2022 ? ?Admitted From: home  ?Disposition:  home  ? ?Recommendations for Outpatient Follow-up:  ?Follow up with PCP in 1-2 weeks ?F/u w/ cardio, Dr. Rockey Situ, in 1-2 weeks ? ?Home Health: no  ?Equipment/Devices: ? ?Discharge Condition: stable  ?CODE STATUS: full  ?Diet recommendation: Heart Healthy  ? ?Brief/Interim Summary: ?HPI was taken from Dr. Francine Graven: ?Julie Phillips is a 88 y.o. female with medical history significant for atrial fibrillation on chronic anticoagulation therapy, hypertension, hypothyroidism, morbid obesity (BMI 47.3 kg/m2) who presents to the ER for evaluation of increased fatigue, exertional shortness of breath, orthopnea and lower extremity swelling. ?Patient states that she has had symptoms for several days and had gone to her primary care provider's office to get her INR checked and was noted to be tachycardic with heart rate of 140bpm.  She was noticeably winded with minimal exertion and was referred to the ER for further evaluation.  Patient denies having a known history of CHF. ?She denies having any chest pain, no nausea, no vomiting, no diaphoresis, no palpitations, no abdominal pain, no cough, no fever, no chills, no urinary symptoms, no focal deficit or blurred vision. ? ?As per Dr. Jimmye Norman: Pt presented w/ a. fib w/ RVR and CHF exacerbation. Pt was treated w/ lasix, metoprolol, losartan. Also, pt had cardioversion on 04/02/22 which was successful. Pt was d/c home w/ these cardiac med changes: metoprolol succinate '50mg'$  BID, lasix '20mg'$  daily, amiodarone '400mg'$  BID x 5 days then 200 mg BID. Pt will f/u w/ cardio, Dr. Rockey Situ, in 1-2 weeks. For more information, please see previous progress/consult notes.  ? ?Discharge Diagnoses:  ?Principal Problem: ?  Acute on chronic diastolic CHF (congestive heart failure) ([place]) ?Active  Problems: ?  Chronic atrial fibrillation with RVR ([place]) ?  Essential hypertension ?  Hypothyroid ?  Atrial fibrillation ([place]) ? ?Acute on chronic diastolic CHF exacerbation: continue on IV lasix. Monitor I/Os. BNP 696.7. Echo shows EF 65-70%, grade II diastolic dysfunction. Continue on metoprolol, losartan  ?  ?Chronic atrial fibrillation: w/ RVR. S/p cardioversion today which was successful. D/c IV amio drip and start amio '400mg'$  BID x 5 days, '200mg'$  BID as per cardio. Continue on warfarin. Monitor INR ?  ?HTN: continue on metoprolol, losartan, diltiazem & amio  ? ?Hypothyroidism: continue on levothyroxine  ? ?Morbid obesity: BMI 43.7. Complicates overall care & prognosis  ? ?Normocytic anemia: no need for a transfusion currently ? ?Discharge Instructions ? ?Discharge Instructions   ? ? Diet - low sodium heart healthy   Complete by: As directed ?  ? Discharge instructions   Complete by: As directed ?  ? F/u w/ PCP in 1-2 weeks. F/u w/ cardio, Dr. Rockey Situ, 1-2 weeks.  ? Increase activity slowly   Complete by: As directed ?  ? ?  ? ?Allergies as of 04/02/2022   ? ?   Reactions  ? Statins   ? Myalgias  ? Wheat   ? Throat swelling  ? Amlodipine   ? hematuria  ? Eucalyptus Oil   ? Lisinopril   ? Unrecalled BP med- possibly lisinopril- intolerant, had cough on the medicine- changed to cozaar  ? ?  ? ?  ?Medication List  ?  ? ?STOP taking these medications   ? ?metoprolol tartrate 50 MG tablet ?Commonly known as: LOPRESSOR ?  ? ?  ? ?TAKE these medications   ? ?  amiodarone 400 MG tablet ?Commonly known as: PACERONE ?Take 1 tablet (400 mg total) by mouth 2 (two) times daily for 5 days. ?  ?amiodarone 200 MG tablet ?Commonly known as: Pacerone ?Take 1 tablet (200 mg total) by mouth 2 (two) times daily. Please only start this script after you have completed amiodarone '400mg'$  BID x 5 days. ?  ?diltiazem 120 MG 24 hr capsule ?Commonly known as: CARDIZEM CD ?Take 1 capsule (120 mg total) by mouth daily. ?  ?diphenhydrAMINE 25 mg  capsule ?Commonly known as: Benadryl ?Take 1 capsule (25 mg total) by mouth daily as needed. ?  ?furosemide 20 MG tablet ?Commonly known as: Lasix ?Take 1 tablet (20 mg total) by mouth daily. ?  ?gabapentin 100 MG capsule ?Commonly known as: NEURONTIN ?TAKE 1 CAPSULE BY MOUTH 3 TIMES DAILY ASNEEDED ?  ?levothyroxine 100 MCG tablet ?Commonly known as: SYNTHROID ?TAKE 1 TABLET BY MOUTH ONCE A DAY BEFOREBREAKFAST. ?  ?loratadine 10 MG tablet ?Commonly known as: CLARITIN ?Take 1 tablet (10 mg total) by mouth daily as needed. ?  ?losartan 100 MG tablet ?Commonly known as: COZAAR ?Take 1 tablet (100 mg total) by mouth daily. ?  ?metoprolol succinate 50 MG 24 hr tablet ?Commonly known as: TOPROL-XL ?Take 1 tablet (50 mg total) by mouth 2 (two) times daily. Take with or immediately following a meal. ?What changed:  ?when to take this ?additional instructions ?  ?nitroGLYCERIN 0.4 MG SL tablet ?Commonly known as: NITROSTAT ?Place 1 tablet (0.4 mg total) under the tongue every 5 (five) minutes as needed for chest pain. ?  ?omeprazole 20 MG capsule ?Commonly known as: PRILOSEC ?Take 1 capsule (20 mg total) by mouth daily. ?  ?Polyethyl Glycol-Propyl Glycol 0.4-0.3 % Gel ophthalmic gel ?Commonly known as: SYSTANE ?Place 1 application into both eyes. ?  ?potassium chloride 10 MEQ tablet ?Commonly known as: KLOR-CON ?Take 1 tablet (10 mEq total) by mouth daily. ?  ?vitamin B-12 1000 MCG tablet ?Commonly known as: CYANOCOBALAMIN ?Take 1 tablet (1,000 mcg total) by mouth daily. ?  ?warfarin 5 MG tablet ?Commonly known as: COUMADIN ?Take as directed. If you are unsure how to take this medication, talk to your nurse or doctor. ?Original instructions: TAKE ONE AND ONE-HALF TABLET BY MOUTH DAILY EXCEPT TAKE 1 TABLET ON MONDAYS AND THURSDAYS OR AS DIRECTED BY ANTICOAGULATION CLINIC ?What changed:  ?how much to take ?how to take this ?when to take this ?additional instructions ?  ? ?  ? ? ?Allergies  ?Allergen Reactions  ? Statins   ?   Myalgias  ? Wheat   ?  Throat swelling  ? Amlodipine   ?  hematuria  ? Eucalyptus Oil   ? Lisinopril   ?  Unrecalled BP med- possibly lisinopril- intolerant, had cough on the medicine- changed to cozaar  ? ? ?Consultations: ?Cardio  ? ? ?Procedures/Studies: ?DG Chest Portable 1 View ? ?Result Date: 04/01/2022 ?CLINICAL DATA:  Shortness of breath x3 days, history of AFib and CHF. EXAM: PORTABLE CHEST 1 VIEW COMPARISON:  February 08, 2022 FINDINGS: Enlarged cardiac silhouette with central vascular prominence. Bibasilar predominant interstitial opacities. Possible small right pleural effusion. No visible pneumothorax. No acute osseous abnormality. IMPRESSION: Findings suggestive of CHF with interstitial pulmonary edema. Electronically Signed   By: Dahlia Bailiff M.D.   On: 04/01/2022 11:54   ?(Echo, Carotid, EGD, Colonoscopy, ERCP)  ? ? ?Subjective: Pt c/o fatigue  ? ? ?Discharge Exam: ?Vitals:  ? 04/02/22 1350 04/02/22 1618  ?BP: 106/84 135/62  ?  Pulse: (!) 55 (!) 55  ?Resp: 16 16  ?Temp:  98.1 ?F (36.7 ?C)  ?SpO2: 97% 97%  ? ?Vitals:  ? 04/02/22 1340 04/02/22 1345 04/02/22 1350 04/02/22 1618  ?BP:  93/76 106/84 135/62  ?Pulse: (!) 52 (!) 56 (!) 55 (!) 55  ?Resp: '16 12 16 16  '$ ?Temp:    98.1 ?F (36.7 ?C)  ?TempSrc:      ?SpO2: 97% 96% 97% 97%  ?Weight:      ?Height:      ? ? ?General: Pt is alert, awake, not in acute distress. Obese ?Cardiovascular: S1/S2+, no rubs, no gallops ?Respiratory: CTA bilaterally, no wheezing, no rhonchi ?Abdominal: Soft, NT, obese, bowel sounds + ?Extremities: no cyanosis ? ? ? ?The results of significant diagnostics from this hospitalization (including imaging, microbiology, ancillary and laboratory) are listed below for reference.   ? ? ?Microbiology: ?Recent Results (from the past 240 hour(s))  ?Resp Panel by RT-PCR (Flu A&B, Covid)     Status: None  ? Collection Time: 04/01/22 12:32 PM  ? Specimen: Nasopharyngeal(NP) swabs in vial transport medium  ?Result Value Ref Range Status  ? SARS  Coronavirus 2 by RT PCR NEGATIVE NEGATIVE Final  ?  Comment: (NOTE) ?SARS-CoV-2 target nucleic acids are NOT DETECTED. ? ?The SARS-CoV-2 RNA is generally detectable in upper respiratory ?specimens during the acu

## 2022-04-02 NOTE — CV Procedure (Addendum)
Cardioversion procedure note ?For atrial fibrillation, persistent. ? ?Procedure Details: ? ?Consent: Risks of procedure as well as the alternatives and risks of each were explained to the (patient/caregiver).  Consent for procedure obtained. ? ?Time Out: Verified patient identification, verified procedure, site/side was marked, verified correct patient position, special equipment/implants available, medications/allergies/relevent history reviewed, required imaging and test results available.  Performed ? ?Patient placed on cardiac monitor, pulse oximetry, supplemental oxygen as necessary.   ?Sedation given: propofol IV, Dr. Rosey Bath ?Pacer pads placed anterior and posterior chest. ? ? ?Cardioverted 1 time(s).   ?Cardioverted at  150 J. Synchronized biphasic ?Converted to NSR ? ? ?Evaluation: ?Findings: Post procedure EKG shows: NSR ?Complications: None ?Patient did tolerate procedure well. ?We will wean off the amiodarone infusion, start amiodarone oral pills 400 twice daily for 5 days then down to 200 twice daily ? ?Time Spent Directly with the Patient: ? ?45 minutes  ? ?Esmond Plants, M.D., Ph.D.  ?

## 2022-04-04 ENCOUNTER — Telehealth: Payer: Self-pay | Admitting: Physician Assistant

## 2022-04-04 NOTE — Telephone Encounter (Signed)
Paged by answering service.  Recent cardioversion.  Patient reports unable to sleep for past 36 hours.  This is unusual for her.  She is asymptomatic.  Vital signs normal.  She does not think she is back in atrial fibrillation.  Denies chest pain, shortness of breath, dizziness, palpitation, orthopnea, PND, syncope, lower extremity edema, cough, congestion, fever or chills.  No sick contact.  Denies having anxiety or stress ? ?I have recommended to try over-the-counter melatonin and relaxing technique.  If still unable to sleep she needs to contact her primary care doctor.   ?

## 2022-04-06 ENCOUNTER — Encounter: Payer: Self-pay | Admitting: Cardiovascular Disease

## 2022-04-07 ENCOUNTER — Telehealth: Payer: Self-pay | Admitting: Cardiovascular Disease

## 2022-04-07 NOTE — Telephone Encounter (Signed)
Patient was in the hospital and want to talk to the nurse bout her medication. Please advise  ?

## 2022-04-07 NOTE — Telephone Encounter (Signed)
Spoke with patient.  ? ?She reports that this morning she woke up with the blood pounding in her head, which stopped when she got up, however she is now dizzy. Dizziness goes away when she lies down, and she states that she's not dizzy to the point of nausea, but feels like she's leaning off to one side.  ? ?States that last night when she was taking her medications, she may have confused her metoprolol and amiodarone because they look similar, but she's not sure that she did.  ? ?Prior to calling she checked her BP and it was 140/71 with a HR of 56.  ? ?Advised patient to stay well hydrated and change positions slowly while I forwarded this to MD for review.  ? ?Pt verbalized understanding.  ?

## 2022-04-07 NOTE — Telephone Encounter (Signed)
Minna Merritts, MD   ? ?Hard to say what is going on  ?Whether it is medication side effect or other  ?We will try to get some breakfast in, continue to monitor pressures, take it easy today  ?Would confirm no other neurologic issues concerning for TIA or stroke  ?Would check blood pressure and heart rate several hours after taking her morning medications  ?If symptoms persist may need to be seen in the office  ?Thx  ?TG   ?Called and spoke with patient. Went over MDs reply and recommendations. Patient verbalized understanding. Requested to make appt in the event that she's not feeling better, states that she will cancel if the dizziness goes away.  ? ?Pt scheduled for 4/28 with Ignacia Bayley, NP.  ?

## 2022-04-08 ENCOUNTER — Other Ambulatory Visit (HOSPITAL_COMMUNITY): Payer: Self-pay

## 2022-04-09 ENCOUNTER — Ambulatory Visit (INDEPENDENT_AMBULATORY_CARE_PROVIDER_SITE_OTHER): Payer: PPO | Admitting: Nurse Practitioner

## 2022-04-09 ENCOUNTER — Encounter: Payer: Self-pay | Admitting: Nurse Practitioner

## 2022-04-09 VITALS — BP 182/74 | HR 56 | Ht 61.0 in | Wt 229.4 lb

## 2022-04-09 DIAGNOSIS — I10 Essential (primary) hypertension: Secondary | ICD-10-CM

## 2022-04-09 DIAGNOSIS — R5383 Other fatigue: Secondary | ICD-10-CM

## 2022-04-09 DIAGNOSIS — G4730 Sleep apnea, unspecified: Secondary | ICD-10-CM

## 2022-04-09 DIAGNOSIS — I4819 Other persistent atrial fibrillation: Secondary | ICD-10-CM | POA: Diagnosis not present

## 2022-04-09 DIAGNOSIS — R001 Bradycardia, unspecified: Secondary | ICD-10-CM | POA: Diagnosis not present

## 2022-04-09 MED ORDER — AMIODARONE HCL 200 MG PO TABS: 200.0000 mg | ORAL_TABLET | Freq: Every day | ORAL | 3 refills | Status: DC

## 2022-04-09 NOTE — Patient Instructions (Addendum)
Medication Instructions:  ?Your physician has recommended you make the following change in your medication:  ? ?DECREASE Amiodarone to 200 mg once a day ? ? ?*If you need a refill on your cardiac medications before your next appointment, please call your pharmacy* ? ? ?Lab Work: ?None ? ?If you have labs (blood work) drawn today and your tests are completely normal, you will receive your results only by: ?MyChart Message (if you have MyChart) OR ?A paper copy in the mail ?If you have any lab test that is abnormal or we need to change your treatment, we will call you to review the results. ? ? ?Testing/Procedures: ?None ? ? ?Follow-Up: ?At [facility], you and your health needs are our priority.  As part of our continuing mission to provide you with exceptional heart care, we have created designated Provider Care Teams.  These Care Teams include your primary Cardiologist (physician) and Advanced Practice Providers (APPs -  Physician Assistants and Nurse Practitioners) who all work together to provide you with the care you need, when you need it. ? ?Referral placed for pulmonary to evaluate Sleep Disorder breathing ? ?Your next appointment:   ?2 week(s) ? ?The format for your next appointment:   ?In Person ? ?Provider:   ?Ida Rogue, MD or Murray Hodgkins, NP  ? ? ? ? ?Important Information About Sugar ? ? ? ? ?  ?

## 2022-04-09 NOTE — Progress Notes (Signed)
? ? ?Office Visit  ?  ?Patient Name: Julie Phillips ?Date of Encounter: 04/09/2022 ? ?Primary Care Provider:  Tonia Ghent, MD ?Primary Cardiologist:  Ida Rogue, MD ? ?Chief Complaint  ?  ?88 year old female with a history of persistent atrial fibrillation, hypertension, hyperlipidemia, hypothyroidism, frequent falls, and obesity, who presents for follow-up after recent hospitalization for HFpEF and atrial fibrillation. ? ?Past Medical History  ?  ?Past Medical History:  ?Diagnosis Date  ? Chronic heart failure with preserved ejection fraction (HFpEF) ([place])   ? a. 01/2022 Echo: EF 65-70%, no rwma, mild LVH, GrII DD, nl RV fxn, mildly dil LA, mild MR, ao scelrosis.  ? Fall   ? GERD (gastroesophageal reflux disease)   ? History of chicken pox   ? Hx of colonic polyps   ? Hyperlipidemia   ? Hypertension   ? Hypothyroidism   ? Keratopathy 2018  ? R eye  ? LVH (left ventricular hypertrophy)   ? Persistent atrial fibrillation ([place])   ? a. Initially dx in 2012; b. 01/2022 recurrent AF rvr noted-->warfarin added (CHA2DS2VASc = 5); c. 03/2022 Amio added-->DCCV x 1 (150J).  ? ?Past Surgical History:  ?Procedure Laterality Date  ? CARDIOVASCULAR STRESS TEST  2011  ? normal   ? CARDIOVERSION N/A 04/02/2022  ? Procedure: CARDIOVERSION;  Surgeon: Minna Merritts, MD;  Location: [facility] ORS;  Service: Cardiovascular;  Laterality: N/A;  ? keratopathy    ? ? ?Allergies ? ?Allergies  ?Allergen Reactions  ? Statins   ?  Myalgias  ? Wheat   ?  Throat swelling  ? Amlodipine   ?  hematuria  ? Eucalyptus Oil   ? Lisinopril   ?  Unrecalled BP med- possibly lisinopril- intolerant, had cough on the medicine- changed to cozaar  ? ? ?History of Present Illness  ?  ?88 year old female with the above past medical history including persistent atrial fibrillation, hypertension, hyperlipidemia, hypothyroidism, frequent falls, and obesity.  She was initially diagnosed with atrial fibrillation in [place] in the setting of hypokalemia in  2007.  Echo at that time showed preserved EF with mild mitral and tricuspid regurgitation.  Stress testing was nonischemic.  She was initially placed on warfarin but this was discontinued due to frequent falls.  In February 2023, she was admitted to [facility] regional with chest pressure and mild high-sensitivity troponin elevation, in the setting of A-fib with RVR.  She reported at that time, tachypalpitations dating back to around Christmas 2022.  Echocardiogram during admission showed an EF of 65 to 70% without regional wall motion abnormalities, mild LVH, grade 2 diastolic dysfunction, mildly dilated left atrium, mild MR, and aortic valve sclerosis.  She reported improved balance at home and she was placed back on warfarin with recommendation for outpatient ischemic testing.  At March clinic follow-up, she was maintaining sinus rhythm on diltiazem and metoprolol.  She was scheduled for coronary CT angiography however, upon presentation, was noted to be in atrial fibrillation and this was canceled.  Diltiazem was titrated to 180 mg daily however, she did not tolerate this and continue to take 120 mg daily.  Unfortunately, on April 20, she presented to primary care with complaints of dyspnea and was found to be in A-fib with RVR in the 140s.  She was transferred to the [facility] ED, where BNP was elevated at 696 and she was treated with intravenous Lasix.  She was placed on intravenous amiodarone and subsequently underwent successful cardioversion on April 21.  Amiodarone was  transitioned to an oral formulation and she was discharged home. ? ?Since discharge, patient has contacted our office twice in the setting of difficulty sleeping and subsequently with headache and dizziness.  In discussing symptoms since discharge today, she has been noticing significant fatigue and generalized malaise with poor appetite.  She has had some lightheadedness but overall, just notes that she has been feeling off.  She thinks it may be  related to the amiodarone, which she is currently taking 200 mg twice daily.  She also notes that at times, she will awaken suddenly in the middle the night hyperventilating.  She is not necessarily dyspneic per se and she is able to fall back to sleep within 20 to 30 minutes.  She has never been tested for sleep apnea.  She denies chest pain, palpitations, orthopnea, syncope, edema. ? ?Home Medications  ?  ?Current Outpatient Medications  ?Medication Sig Dispense Refill  ? amiodarone (PACERONE) 200 MG tablet Take 1 tablet (200 mg total) by mouth daily. 90 tablet 3  ? diltiazem (CARDIZEM CD) 120 MG 24 hr capsule Take 1 capsule (120 mg total) by mouth daily. 90 capsule 3  ? diphenhydrAMINE (BENADRYL) 25 mg capsule Take 1 capsule (25 mg total) by mouth daily as needed.    ? furosemide (LASIX) 20 MG tablet Take 1 tablet (20 mg total) by mouth daily. 30 tablet 0  ? gabapentin (NEURONTIN) 100 MG capsule TAKE 1 CAPSULE BY MOUTH 3 TIMES DAILY ASNEEDED 270 capsule 3  ? levothyroxine (SYNTHROID) 100 MCG tablet TAKE 1 TABLET BY MOUTH ONCE A DAY BEFOREBREAKFAST. 90 tablet 3  ? loratadine (CLARITIN) 10 MG tablet Take 1 tablet (10 mg total) by mouth daily as needed.    ? losartan (COZAAR) 100 MG tablet Take 1 tablet (100 mg total) by mouth daily. 90 tablet 3  ? metoprolol succinate (TOPROL-XL) 50 MG 24 hr tablet Take 1 tablet (50 mg total) by mouth 2 (two) times daily. Take with or immediately following a meal.    ? nitroGLYCERIN (NITROSTAT) 0.4 MG SL tablet Place 1 tablet (0.4 mg total) under the tongue every 5 (five) minutes as needed for chest pain. 25 tablet 3  ? omeprazole (PRILOSEC) 20 MG capsule Take 1 capsule (20 mg total) by mouth daily. 90 capsule 3  ? Polyethyl Glycol-Propyl Glycol (SYSTANE) 0.4-0.3 % GEL ophthalmic gel Place 1 application into both eyes.    ? potassium chloride (KLOR-CON) 10 MEQ tablet Take 1 tablet (10 mEq total) by mouth daily. 90 tablet 3  ? vitamin B-12 (CYANOCOBALAMIN) 1000 MCG tablet Take 1  tablet (1,000 mcg total) by mouth daily.    ? warfarin (COUMADIN) 5 MG tablet TAKE ONE AND ONE-HALF TABLET BY MOUTH DAILY EXCEPT TAKE 1 TABLET ON MONDAYS AND THURSDAYS OR AS DIRECTED BY ANTICOAGULATION CLINIC (Patient taking differently: Take 5-7.5 mg by mouth See admin instructions. Take 1 tablet ('5mg'$ ) by mouth every Tuesday, Wednesday, Thursday, Friday, Saturday and Sunday evening and take 1? tablets (7.'5mg'$ ) by mouth every Monday evening) 45 tablet 0  ? ?No current facility-administered medications for this visit.  ?  ? ?Review of Systems  ?  ?Since hospital discharge, she has had generalized malaise, fatigue, poor appetite, intermittent headache, lightheadedness, and general feeling of unsteadiness.  She has awakened on a few occasions feeling startled and hyperventilating.  She has some degree of chronic dyspnea on exertion.  She denies palpitations, chest pain, orthopnea, syncope, edema, or early satiety.  All other systems reviewed and are otherwise  negative except as noted above. ?  ? ?Physical Exam  ?  ?VS:  BP (!) 182/74 (BP Location: Left Arm, Patient Position: Sitting, Cuff Size: Large)   Pulse (!) 56   Ht '5\' 1"'$  (1.549 m)   Wt 229 lb 6 oz (104 kg)   SpO2 97%   BMI 43.34 kg/m?  , BMI Body mass index is 43.34 kg/m?. ?STOP-Bang Score:  6      ?GEN: Obese, in no acute distress. ?HEENT: normal. ?Neck: Supple, no JVD, carotid bruits, or masses. ?Cardiac: RRR, no murmurs, rubs, or gallops. No clubbing, cyanosis, edema.  Radials/PT 2+ and equal bilaterally.  ?Respiratory:  Respirations regular and unlabored, clear to auscultation bilaterally. ?GI: Obese, soft, nontender, nondistended, BS + x 4. ?MS: no deformity or atrophy. ?Skin: warm and dry, no rash. ?Neuro:  Strength and sensation are intact. ?Psych: Normal affect. ? ?Accessory Clinical Findings  ?  ?ECG personally reviewed by me today -sinus bradycardia, 56, nonspecific ST changes with baseline fact- no acute changes. ? ?Lab Results  ?Component Value  Date  ? WBC 10.7 (H) 04/02/2022  ? HGB 10.8 (L) 04/02/2022  ? HCT 33.9 (L) 04/02/2022  ? MCV 86.0 04/02/2022  ? PLT 257 04/02/2022  ? ?Lab Results  ?Component Value Date  ? CREATININE 0.87 04/02/2022  ? BUN 19 04/2

## 2022-04-13 ENCOUNTER — Telehealth: Payer: Self-pay
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
## 2022-04-13 NOTE — Addendum Note (Signed)
Addended by: Anselm Pancoast on: 04/13/2022 12:02 PM ? ? Modules accepted: Orders ? ?

## 2022-04-14 NOTE — Progress Notes (Signed)
? Patient ID: Julie Phillips, female    DOB: Dec 07, 1932, 88 y.o.   MRN: [id number] ? ?HPI ? ?Ms Wolken is a 88 y/o female with a history of atrial fibrillation, hyperlipidemia, HTN, thyroid disease, GERD, keratopathy (right eye) and chronic heart failure.  ? ?Echo report from 02/10/22 reviewed and showed an EF of 65-70% along with mild LVH/LAE and mild MR.  ? ?Admitted 04/01/22 due to increased fatigue, exertional shortness of breath, orthopnea and lower extremity swelling. Cardiology consult obtained. Treated w/ lasix, metoprolol, losartan. Successful cardioversion done. Weaned off amiodarone drip and transitioned to oral amiodarone. Discharged the next day.  ? ?She presents today for her initial visit with a chief complaint of minimal fatigue upon moderate exertion. She describes this as chronic in nature although improving. She has associated shortness of breath and easy bruising along with this. She denies any difficulty sleeping (was previously waking up gasping), dizziness, abdominal distention, palpitations, pedal edema, chest pain, cough or weight gain.  ? ?Has had amiodarone decreased to daily and says that she's feeling better. Participating in [place] HF program and was supposed to start spironolactone and jardiance yesterday. She says that she hasn't picked them up yet and she's hesitant to start them both because she is feeling so much better and she doesn't "want to rock the boat".  ? ?Understands that she needs to get lab work after starting the medications but asks that it be done here when she sees cardiology on 04/26/22 ? ?Past Medical History:  ?Diagnosis Date  ? Arrhythmia   ? atrial fibrillation  ? CHF (congestive heart failure) ([place])   ? Chronic heart failure with preserved ejection fraction (HFpEF) ([place])   ? a. 01/2022 Echo: EF 65-70%, no rwma, mild LVH, GrII DD, nl RV fxn, mildly dil LA, mild MR, ao scelrosis.  ? Fall   ? GERD (gastroesophageal reflux disease)   ? History of chicken pox   ? Hx  of colonic polyps   ? Hyperlipidemia   ? Hypertension   ? Hypothyroidism   ? Keratopathy 2018  ? R eye  ? LVH (left ventricular hypertrophy)   ? Persistent atrial fibrillation ([place])   ? a. Initially dx in 2012; b. 01/2022 recurrent AF rvr noted-->warfarin added (CHA2DS2VASc = 5); c. 03/2022 Amio added-->DCCV x 1 (150J).  ? ?Past Surgical History:  ?Procedure Laterality Date  ? CARDIOVASCULAR STRESS TEST  2011  ? normal   ? CARDIOVERSION N/A 04/02/2022  ? Procedure: CARDIOVERSION;  Surgeon: Minna Merritts, MD;  Location: [facility] ORS;  Service: Cardiovascular;  Laterality: N/A;  ? keratopathy    ? ?Family History  ?Problem Relation Age of Onset  ? Heart disease Mother   ? Heart disease Father   ? Breast cancer Sister   ? Breast cancer Daughter   ? Colon cancer Neg Hx   ? ?Social History  ? ?Tobacco Use  ? Smoking status: Former  ?  Packs/day: 2.00  ?  Years: 20.00  ?  Pack years: 40.00  ?  Types: Cigarettes  ?  Quit date: 12/14/1971  ?  Years since quitting: 50.3  ? Smokeless tobacco: Never  ?Substance Use Topics  ? Alcohol use: Not Currently  ? ?Allergies  ?Allergen Reactions  ? Statins   ?  Myalgias  ? Wheat   ?  Throat swelling  ? Amlodipine   ?  hematuria  ? Eucalyptus Oil   ? Lisinopril   ?  Unrecalled BP med- possibly lisinopril- intolerant, had  cough on the medicine- changed to cozaar  ? Rice Nausea And Vomiting  ? ?Prior to Admission medications   ?Medication Sig Start Date End Date Taking? Authorizing Provider  ?amiodarone (PACERONE) 200 MG tablet Take 1 tablet (200 mg total) by mouth daily. 04/09/22  Yes Theora Gianotti, NP  ?diltiazem (CARDIZEM CD) 120 MG 24 hr capsule Take 1 capsule (120 mg total) by mouth daily. 03/31/22  Yes Minna Merritts, MD  ?diphenhydrAMINE (BENADRYL) 25 mg capsule Take 1 capsule (25 mg total) by mouth daily as needed. 01/14/17  Yes Tonia Ghent, MD  ?furosemide (LASIX) 20 MG tablet Take 1 tablet (20 mg total) by mouth daily. 04/02/22 05/02/22 Yes Wyvonnia Dusky, MD   ?gabapentin (NEURONTIN) 100 MG capsule TAKE 1 CAPSULE BY MOUTH 3 TIMES DAILY ASNEEDED 01/29/21  Yes Tonia Ghent, MD  ?levothyroxine (SYNTHROID) 100 MCG tablet TAKE 1 TABLET BY MOUTH ONCE A DAY BEFOREBREAKFAST. 07/12/21  Yes Tonia Ghent, MD  ?loratadine (CLARITIN) 10 MG tablet Take 1 tablet (10 mg total) by mouth daily as needed. 01/14/17  Yes Tonia Ghent, MD  ?losartan (COZAAR) 100 MG tablet Take 1 tablet (100 mg total) by mouth daily. 03/01/22  Yes Minna Merritts, MD  ?metoprolol succinate (TOPROL-XL) 50 MG 24 hr tablet Take 1 tablet (50 mg total) by mouth 2 (two) times daily. Take with or immediately following a meal. 04/02/22  Yes Wyvonnia Dusky, MD  ?nitroGLYCERIN (NITROSTAT) 0.4 MG SL tablet Place 1 tablet (0.4 mg total) under the tongue every 5 (five) minutes as needed for chest pain. 01/01/22  Yes Minna Merritts, MD  ?omeprazole (PRILOSEC) 20 MG capsule Take 1 capsule (20 mg total) by mouth daily. 07/10/21  Yes Tonia Ghent, MD  ?Polyethyl Glycol-Propyl Glycol (SYSTANE) 0.4-0.3 % GEL ophthalmic gel Place 1 application into both eyes.   Yes [provider]  ?potassium chloride (KLOR-CON) 10 MEQ tablet Take 1 tablet (10 mEq total) by mouth daily. 07/12/21  Yes Tonia Ghent, MD  ?vitamin B-12 (CYANOCOBALAMIN) 1000 MCG tablet Take 1 tablet (1,000 mcg total) by mouth daily. 02/10/22  Yes Jennye Boroughs, MD  ?warfarin (COUMADIN) 5 MG tablet TAKE ONE AND ONE-HALF TABLET BY MOUTH DAILY EXCEPT TAKE 1 TABLET ON MONDAYS AND THURSDAYS OR AS DIRECTED BY ANTICOAGULATION CLINIC ?Patient taking differently: Take 5-7.5 mg by mouth See admin instructions. Take 1 tablet ('5mg'$ ) by mouth every Tuesday, Wednesday, Thursday, Friday, Saturday and Sunday evening and take 1? tablets (7.'5mg'$ ) by mouth every Monday evening 02/18/22  Yes Tonia Ghent, MD  ?empagliflozin (JARDIANCE) 10 MG TABS tablet Take 10 mg by mouth daily. ?Patient not taking: Reported on 04/15/2022    [provider]   ?spironolactone (ALDACTONE) 25 MG tablet Take 25 mg by mouth daily. ?Patient not taking: Reported on 04/15/2022    [provider]  ? ?Review of Systems  ?Constitutional:  Positive for fatigue (very little; improving). Negative for appetite change.  ?HENT:  Negative for congestion, postnasal drip and sore throat.   ?Eyes: Negative.   ?Respiratory:  Positive for shortness of breath (with moderat exertion). Negative for cough and chest tightness.   ?Cardiovascular:  Negative for chest pain, palpitations and leg swelling.  ?Gastrointestinal:  Negative for abdominal distention and abdominal pain.  ?Endocrine: Negative.   ?Genitourinary: Negative.   ?Musculoskeletal:  Negative for back pain and neck pain.  ?Skin: Negative.   ?Allergic/Immunologic: Negative.   ?Neurological:  Negative for dizziness and light-headedness.  ?Hematological:  Negative for adenopathy. Bruises/bleeds easily.  ?Psychiatric/Behavioral:  Negative for dysphoric mood and sleep disturbance (sleeping on 1 pillow). The patient is not nervous/anxious.   ? ?Vitals:  ? 04/15/22 1045  ?BP: (!) 137/58  ?Pulse: (!) 55  ?Resp: 16  ?SpO2: 94%  ?Weight: 228 lb 2 oz (103.5 kg)  ?Height: '5\' 1"'$  (1.549 m)  ? ?Wt Readings from Last 3 Encounters:  ?04/15/22 228 lb 2 oz (103.5 kg)  ?04/09/22 229 lb 6 oz (104 kg)  ?04/02/22 231 lb 6.4 oz (105 kg)  ? ?Lab Results  ?Component Value Date  ? CREATININE 0.87 04/02/2022  ? CREATININE 0.79 04/01/2022  ? CREATININE 0.78 02/10/2022  ? ?Physical Exam ?Vitals and nursing note reviewed.  ?Constitutional:   ?   Appearance: Normal appearance.  ?HENT:  ?   Head: Normocephalic and atraumatic.  ?Cardiovascular:  ?   Rate and Rhythm: Regular rhythm. Bradycardia present.  ?Pulmonary:  ?   Effort: Pulmonary effort is normal. No respiratory distress.  ?   Breath sounds: No wheezing or rales.  ?Abdominal:  ?   General: There is no distension.  ?   Palpations: Abdomen is soft.  ?   Tenderness: There is no abdominal tenderness.   ?Musculoskeletal:     ?   General: No tenderness.  ?   Cervical back: Normal range of motion and neck supple.  ?   Right lower leg: No edema.  ?   Left lower leg: No edema.  ?Skin: ?   General: Skin is warm and dry.  ?Neur

## 2022-04-15 ENCOUNTER — Ambulatory Visit (INDEPENDENT_AMBULATORY_CARE_PROVIDER_SITE_OTHER): Payer: PPO

## 2022-04-15 ENCOUNTER — Encounter: Payer: Self-pay | Admitting: Family

## 2022-04-15 ENCOUNTER — Ambulatory Visit: Payer: PPO | Attending: Family | Admitting: Family

## 2022-04-15 VITALS — BP 137/58 | HR 55 | Resp 16 | Ht 61.0 in | Wt 228.1 lb

## 2022-04-15 DIAGNOSIS — I110 Hypertensive heart disease with heart failure: Secondary | ICD-10-CM | POA: Diagnosis not present

## 2022-04-15 DIAGNOSIS — Z79899 Other long term (current) drug therapy: Secondary | ICD-10-CM | POA: Diagnosis not present

## 2022-04-15 DIAGNOSIS — I10 Essential (primary) hypertension: Secondary | ICD-10-CM

## 2022-04-15 DIAGNOSIS — E079 Disorder of thyroid, unspecified: Secondary | ICD-10-CM | POA: Insufficient documentation

## 2022-04-15 DIAGNOSIS — I480 Paroxysmal atrial fibrillation: Secondary | ICD-10-CM | POA: Diagnosis not present

## 2022-04-15 DIAGNOSIS — I4891 Unspecified atrial fibrillation: Secondary | ICD-10-CM | POA: Diagnosis not present

## 2022-04-15 DIAGNOSIS — R0602 Shortness of breath: Secondary | ICD-10-CM | POA: Insufficient documentation

## 2022-04-15 DIAGNOSIS — K219 Gastro-esophageal reflux disease without esophagitis: Secondary | ICD-10-CM | POA: Insufficient documentation

## 2022-04-15 DIAGNOSIS — Z7901 Long term (current) use of anticoagulants: Secondary | ICD-10-CM | POA: Diagnosis not present

## 2022-04-15 DIAGNOSIS — I5032 Chronic diastolic (congestive) heart failure: Secondary | ICD-10-CM | POA: Insufficient documentation

## 2022-04-15 DIAGNOSIS — M7989 Other specified soft tissue disorders: Secondary | ICD-10-CM | POA: Diagnosis not present

## 2022-04-15 DIAGNOSIS — R5383 Other fatigue: Secondary | ICD-10-CM | POA: Diagnosis not present

## 2022-04-15 DIAGNOSIS — E785 Hyperlipidemia, unspecified: Secondary | ICD-10-CM | POA: Diagnosis not present

## 2022-04-15 LAB — POCT INR: INR: 2.7 (ref 2.0–3.0)

## 2022-04-15 NOTE — Progress Notes (Addendum)
Pt reported she thought she was supposed to take 1 tablet daily except take nothing on Mondays. She is in range today so dosing calendar was updated to what she has been taking since she was d/c from the hospital. ?Pt will also start several new medication on Monday, 5/8, that she has not picked up from the pharmacy yet. One of those medications is amiodarone which will increase her INR. She has an apt for 1 week and INR will be followed closely for several weeks to assure her INR is stable with amiodarone. ? ?Continue 1 tablet daily except take 1 1/2 on Mondays. Recheck in 1 week.  ?

## 2022-04-15 NOTE — Patient Instructions (Addendum)
Pre visit review using our clinic review tool, if applicable. No additional management support is needed unless otherwise documented below in the visit note. ? ?Continue 1 tablet daily except take nothing on Mondays. Recheck in 1 week.  ?

## 2022-04-15 NOTE — Patient Instructions (Addendum)
Continue weighing daily and call for an overnight weight gain of 3 pounds or more or a weekly weight gain of more than 5 pounds.   If you have voicemail, please make sure your mailbox is cleaned out so that we may leave a message and please make sure to listen to any voicemails.    Start taking jardiance as 1 tablet every morning.  

## 2022-04-16 ENCOUNTER — Encounter: Payer: Self-pay | Admitting: Cardiovascular Disease

## 2022-04-21 ENCOUNTER — Telehealth (HOSPITAL_COMMUNITY): Payer: Self-pay | Admitting: Emergency Medicine

## 2022-04-21 NOTE — Telephone Encounter (Signed)
Reaching out to patient to offer assistance regarding upcoming cardiac imaging study; pt verbalizes understanding of appt date/time, parking situation and where to check in, pre-test NPO status and medications ordered, and verified current allergies; name and call back number provided for further questions should they arise ?Marchia Bond RN Navigator Cardiac Imaging ?[facility] ?[phone] office ?[phone] cell ? ? ?Denies iv issues ?Daily meds, holding diuretics ?Arrival 115 ?

## 2022-04-22 ENCOUNTER — Ambulatory Visit (INDEPENDENT_AMBULATORY_CARE_PROVIDER_SITE_OTHER): Payer: PPO

## 2022-04-22 ENCOUNTER — Other Ambulatory Visit: Payer: Self-pay | Admitting: Cardiovascular Disease

## 2022-04-22 ENCOUNTER — Ambulatory Visit
Admission: RE | Admit: 2022-04-22 | Discharge: 2022-04-22 | Disposition: A | Discharge status: Home / Self Care | Payer: PPO | Source: Ambulatory Visit | Attending: Cardiovascular Disease | Admitting: Cardiovascular Disease

## 2022-04-22 DIAGNOSIS — Z7901 Long term (current) use of anticoagulants: Secondary | ICD-10-CM

## 2022-04-22 DIAGNOSIS — I2510 Atherosclerotic heart disease of native coronary artery without angina pectoris: Secondary | ICD-10-CM | POA: Diagnosis not present

## 2022-04-22 DIAGNOSIS — R931 Abnormal findings on diagnostic imaging of heart and coronary circulation: Secondary | ICD-10-CM

## 2022-04-22 DIAGNOSIS — R079 Chest pain, unspecified: Secondary | ICD-10-CM | POA: Diagnosis not present

## 2022-04-22 DIAGNOSIS — I209 Angina pectoris, unspecified: Secondary | ICD-10-CM | POA: Diagnosis not present

## 2022-04-22 LAB — POCT INR: INR: 2.5 (ref 2.0–3.0)

## 2022-04-22 MED ORDER — NITROGLYCERIN 0.4 MG SL SUBL
0.8000 mg | SUBLINGUAL_TABLET | Freq: Once | SUBLINGUAL | Status: AC
  Administered 2022-04-22: 0.8 mg via SUBLINGUAL

## 2022-04-22 MED ORDER — IOHEXOL 350 MG/ML SOLN
125.0000 mL | Freq: Once | INTRAVENOUS | Status: AC | PRN
Start: 2022-04-22 — End: 2022-04-22
  Administered 2022-04-22: 125 mL via INTRAVENOUS

## 2022-04-22 NOTE — Progress Notes (Signed)
Patient tolerated CT well. Drank water after. Vital signs stable encourage to drink water throughout day.Reasons explained and verbalized understanding.  

## 2022-04-22 NOTE — Patient Instructions (Addendum)
Pre visit review using our clinic review tool, if applicable. No additional management support is needed unless otherwise documented below in the visit note. ? ?Continue 1 tablet daily except take nothing on Mondays. Recheck in 1 week due to new amiodarone prescription.  ?

## 2022-04-22 NOTE — Progress Notes (Addendum)
Continue 1 tablet daily except take nothing on Mondays. Recheck in 1 week due to new amiodarone prescription.  ?

## 2022-04-25 NOTE — Progress Notes (Signed)
Patient ID: Julie Phillips, female   DOB: 07-04-1932, 88 y.o.   MRN: [id number] ?Cardiology Office Note ? ?Date:  04/26/2022  ? ?ID:  Julie Phillips, DOB 11/21/1932, MRN [id number] ? ?PCP:  Tonia Ghent, MD  ? ?Chief Complaint  ?Patient presents with  ? 2 week follow up   ?  "Doing well." Medications reviewed by the patient verbally.   ? ? ?HPI:  ?Julie Phillips  is a pleasant 88 year-old woman with remote history of  ?CAD, ?Former smoker ?atrial fibrillation in 2007, ? not on anticoagulation (no recurrence in 13 yrs) ?previously on warfarin, stopped, was falling alot ?In [place] in setting of hypokalemia (did stress test/pharmacological study) ?low potassium by her report,  ?hypertension  ?who presents for routine followup of her atrial fibrillation, CAD ? ?LOV with myself 3/23 ?Recent events reviewed with her in detail ?In the hospital April 2023 for fatigue, shortness of breath, orthopnea, leg swelling ?Noted to be in atrial fibrillation with RVR with CHF exacerbation ?Cardioversion April 02, 2022 on amiodarone load at that time ? ?Seen by one of our providers: 04/09/22: Did not feel well at that time ?Amiodarone down to 200 mg daily ?Cardiac CTA ordered  for chest pain ? ?Seen by CHF clinic,  ?Ventricle heath: Side effects on jardiance and spironolactone, side effects of cough, dizziness ? ?Cardiac CTA: 04/22/22 results reviewed with her in detail ?1. Left Main:  No significant stenosis. ?2. LAD: significant stenosis in the mid to distal LAD.  FFRct 0.70 ?3. LCX: significant proximal stenosis. FFR ct showed CTO of proximal LCx ?4. RCA: No significant stenosis.  FFRct 0.82 ?  ?1. CT FFR analysis showed significant stenosis in the mid LAD and ?proximal LCx. ? ?EKG personally reviewed by myself on todays visit ?Sinus bradycardia: rate 59 bpm no significant ST-T wave changes ? ?Atrial fibrillation history as detailed below ?cardioversion April 02, 2022 for atrial fibrillation ?2 epsiodes of afib dec 2022 and feb  2023, lasting 24 hours ? ?In the hospital 2/23: afib with RVR ?Converted to normal sinus rhythm in the hospital on diltiazem infusion ? ?Echocardiogram done in the hospital normal ejection fraction, February 10, 2022 ?Mildly dilated left atrium mild MR ? ?She has tried statins in the past and had a problem with her "urine". Details are uncertain ? ? ?PMH:   has a past medical history of Arrhythmia, CHF (congestive heart failure) ([place]), Chronic heart failure with preserved ejection fraction (HFpEF) ([place]), Fall, GERD (gastroesophageal reflux disease), History of chicken pox, colonic polyps, Hyperlipidemia, Hypertension, Hypothyroidism, Keratopathy (2018), LVH (left ventricular hypertrophy), and Persistent atrial fibrillation ([place]). ? ?PSH:    ?Past Surgical History:  ?Procedure Laterality Date  ? CARDIOVASCULAR STRESS TEST  2011  ? normal   ? CARDIOVERSION N/A 04/02/2022  ? Procedure: CARDIOVERSION;  Surgeon: Minna Merritts, MD;  Location: [facility] ORS;  Service: Cardiovascular;  Laterality: N/A;  ? keratopathy    ? ? ?Current Outpatient Medications  ?Medication Sig Dispense Refill  ? amiodarone (PACERONE) 200 MG tablet Take 1 tablet (200 mg total) by mouth daily. 90 tablet 3  ? diltiazem (CARDIZEM CD) 120 MG 24 hr capsule Take 1 capsule (120 mg total) by mouth daily. 90 capsule 3  ? diphenhydrAMINE (BENADRYL) 25 mg capsule Take 1 capsule (25 mg total) by mouth daily as needed.    ? furosemide (LASIX) 20 MG tablet Take 1 tablet (20 mg total) by mouth daily. 30 tablet 0  ? gabapentin (NEURONTIN) 100 MG  capsule TAKE 1 CAPSULE BY MOUTH 3 TIMES DAILY ASNEEDED 270 capsule 3  ? levothyroxine (SYNTHROID) 100 MCG tablet TAKE 1 TABLET BY MOUTH ONCE A DAY BEFOREBREAKFAST. 90 tablet 3  ? loratadine (CLARITIN) 10 MG tablet Take 1 tablet (10 mg total) by mouth daily as needed.    ? losartan (COZAAR) 100 MG tablet Take 1 tablet (100 mg total) by mouth daily. 90 tablet 3  ? metoprolol succinate (TOPROL-XL) 50 MG 24 hr tablet Take 1 tablet  (50 mg total) by mouth 2 (two) times daily. Take with or immediately following a meal.    ? nitroGLYCERIN (NITROSTAT) 0.4 MG SL tablet Place 1 tablet (0.4 mg total) under the tongue every 5 (five) minutes as needed for chest pain. 25 tablet 3  ? omeprazole (PRILOSEC) 20 MG capsule Take 1 capsule (20 mg total) by mouth daily. 90 capsule 3  ? Polyethyl Glycol-Propyl Glycol (SYSTANE) 0.4-0.3 % GEL ophthalmic gel Place 1 application into both eyes.    ? potassium chloride (KLOR-CON) 10 MEQ tablet Take 1 tablet (10 mEq total) by mouth daily. 90 tablet 3  ? vitamin B-12 (CYANOCOBALAMIN) 1000 MCG tablet Take 1 tablet (1,000 mcg total) by mouth daily.    ? warfarin (COUMADIN) 5 MG tablet TAKE ONE AND ONE-HALF TABLET BY MOUTH DAILY EXCEPT TAKE 1 TABLET ON MONDAYS AND THURSDAYS OR AS DIRECTED BY ANTICOAGULATION CLINIC (Patient taking differently: Take 5-7.5 mg by mouth See admin instructions. Take 1 tablet ('5mg'$ ) by mouth every Tuesday, Wednesday, Thursday, Friday, Saturday and Sunday evening and take 1? tablets (7.'5mg'$ ) by mouth every Monday evening) 45 tablet 0  ? ?No current facility-administered medications for this visit.  ? ?Allergies:   Statins, Wheat, Amlodipine, Eucalyptus oil, Lisinopril, and Rice  ? ?Social History:  The patient  reports that she quit smoking about 50 years ago. Her smoking use included cigarettes. She has a 40.00 pack-year smoking history. She has never used smokeless tobacco. She reports that she does not currently use alcohol. She reports that she does not use drugs.  ? ?Family History:   family history includes Breast cancer in her daughter and sister; Heart disease in her father and mother.  ? ?Review of Systems  ?Constitutional: Negative.   ?HENT: Negative.    ?Respiratory: Negative.    ?Cardiovascular: Negative.   ?Gastrointestinal: Negative.   ?Musculoskeletal: Negative.   ?     Leg weakness, poor balance  ?Neurological: Negative.   ?Psychiatric/Behavioral: Negative.    ?All other systems  reviewed and are negative. ? ? ?PHYSICAL EXAM: ?VS:  BP (!) 110/54 (BP Location: Left Arm, Patient Position: Sitting, Cuff Size: Large)   Pulse (!) 59   Ht '5\' 1"'$  (1.549 m)   Wt 225 lb 4 oz (102.2 kg)   SpO2 95%   BMI 42.56 kg/m?  , BMI Body mass index is 42.56 kg/m?Marland Kitchen ?Constitutional:  oriented to person, place, and time. No distress.  ?HENT:  ?Head: Grossly normal ?Eyes:  no discharge. No scleral icterus.  ?Neck: No JVD, no carotid bruits  ?Cardiovascular: Regular rate and rhythm, no murmurs appreciated ?Pulmonary/Chest: Clear to auscultation bilaterally, no wheezes or rails ?Abdominal: Soft.  no distension.  no tenderness.  ?Musculoskeletal: Normal range of motion ?Neurological:  normal muscle tone. Coordination normal. No atrophy ?Skin: Skin warm and dry ?Psychiatric: normal affect, pleasant ? ? ?Recent Labs: ?04/01/2022: ALT 10; B Natriuretic Peptide 696.7; Magnesium 2.1; TSH 2.542 ?04/02/2022: BUN 19; Creatinine, Ser 0.87; Hemoglobin 10.8; Platelets 257; Potassium 3.9; Sodium 136  ? ? ?  Lipid Panel ?Lab Results  ?Component Value Date  ? CHOL 199 07/10/2021  ? HDL 49 (L) 07/10/2021  ? [place] 127 (H) 07/10/2021  ? TRIG 124 07/10/2021  ? ?  ? ?Wt Readings from Last 3 Encounters:  ?04/26/22 225 lb 4 oz (102.2 kg)  ?04/15/22 228 lb 2 oz (103.5 kg)  ?04/09/22 229 lb 6 oz (104 kg)  ?  ? ?ASSESSMENT AND PLAN: ? ?Paroxysmal atrial fibrillation (HCC) -  ?Recent hospitalization for atrial fibrillation with RVR, underwent cardioversion, ?Maintaining normal sinus rhythm on amiodarone, diltiazem, metoprolol succinate ?On warfarin ? ?HLD (hyperlipidemia) ?Does not want a statin,  cholesterol around 200 ?We will need to readdress with her in light of cardiac CTA findings ? ?Coronary artery disease with unstable angina ?Multivessel disease noted on cardiac CTA ?She is interested in additional evaluation given episodes of chest pain ?We will schedule her for cardiac catheterization as her schedule permits with family ?Hold  warfarin 5 days prior to procedure, restart following procedure ? ?Essential hypertension ?Blood pressure is well controlled on today's visit. No changes made to the medications. ? ?Morbid obesity ?Unable to exer

## 2022-04-25 NOTE — H&P (View-Only) (Signed)
Patient ID: Julie Phillips, female   DOB: 1932/09/03, 88 y.o.   MRN: [id number] Cardiology Office Note  Date:  04/26/2022   ID:  Julie Phillips, DOB 1932-03-24, MRN [id number]  PCP:  Tonia Ghent, MD   Chief Complaint  Patient presents with   2 week follow up     "Doing well." Medications reviewed by the patient verbally.     HPI:  Julie Phillips  is a pleasant 88 year-old woman with remote history of  CAD, Former smoker atrial fibrillation in 2007,  not on anticoagulation (no recurrence in 13 yrs) previously on warfarin, stopped, was falling alot In [place] in setting of hypokalemia (did stress test/pharmacological study) low potassium by her report,  hypertension  who presents for routine followup of her atrial fibrillation, CAD  LOV with myself 3/23 Recent events reviewed with her in detail In the hospital April 2023 for fatigue, shortness of breath, orthopnea, leg swelling Noted to be in atrial fibrillation with RVR with CHF exacerbation Cardioversion April 02, 2022 on amiodarone load at that time  Seen by one of our providers: 04/09/22: Did not feel well at that time Amiodarone down to 200 mg daily Cardiac CTA ordered  for chest pain  Seen by CHF clinic,  Ventricle heath: Side effects on jardiance and spironolactone, side effects of cough, dizziness  Cardiac CTA: 04/22/22 results reviewed with her in detail 1. Left Main:  No significant stenosis. 2. LAD: significant stenosis in the mid to distal LAD.  FFRct 0.70 3. LCX: significant proximal stenosis. FFR ct showed CTO of proximal LCx 4. RCA: No significant stenosis.  FFRct 0.82   1. CT FFR analysis showed significant stenosis in the mid LAD and proximal LCx.  EKG personally reviewed by myself on todays visit Sinus bradycardia: rate 59 bpm no significant ST-T wave changes  Atrial fibrillation history as detailed below cardioversion April 02, 2022 for atrial fibrillation 2 epsiodes of afib dec 2022 and feb  2023, lasting 24 hours  In the hospital 2/23: afib with RVR Converted to normal sinus rhythm in the hospital on diltiazem infusion  Echocardiogram done in the hospital normal ejection fraction, February 10, 2022 Mildly dilated left atrium mild MR  She has tried statins in the past and had a problem with her "urine". Details are uncertain   PMH:   has a past medical history of Arrhythmia, CHF (congestive heart failure) ([place]), Chronic heart failure with preserved ejection fraction (HFpEF) ([place]), Fall, GERD (gastroesophageal reflux disease), History of chicken pox, colonic polyps, Hyperlipidemia, Hypertension, Hypothyroidism, Keratopathy (2018), LVH (left ventricular hypertrophy), and Persistent atrial fibrillation ([place]).  PSH:    Past Surgical History:  Procedure Laterality Date   CARDIOVASCULAR STRESS TEST  2011   normal    CARDIOVERSION N/A 04/02/2022   Procedure: CARDIOVERSION;  Surgeon: Minna Merritts, MD;  Location: [facility] ORS;  Service: Cardiovascular;  Laterality: N/A;   keratopathy      Current Outpatient Medications  Medication Sig Dispense Refill   amiodarone (PACERONE) 200 MG tablet Take 1 tablet (200 mg total) by mouth daily. 90 tablet 3   diltiazem (CARDIZEM CD) 120 MG 24 hr capsule Take 1 capsule (120 mg total) by mouth daily. 90 capsule 3   diphenhydrAMINE (BENADRYL) 25 mg capsule Take 1 capsule (25 mg total) by mouth daily as needed.     furosemide (LASIX) 20 MG tablet Take 1 tablet (20 mg total) by mouth daily. 30 tablet 0   gabapentin (NEURONTIN) 100 MG  capsule TAKE 1 CAPSULE BY MOUTH 3 TIMES DAILY ASNEEDED 270 capsule 3   levothyroxine (SYNTHROID) 100 MCG tablet TAKE 1 TABLET BY MOUTH ONCE A DAY BEFOREBREAKFAST. 90 tablet 3   loratadine (CLARITIN) 10 MG tablet Take 1 tablet (10 mg total) by mouth daily as needed.     losartan (COZAAR) 100 MG tablet Take 1 tablet (100 mg total) by mouth daily. 90 tablet 3   metoprolol succinate (TOPROL-XL) 50 MG 24 hr tablet Take 1 tablet  (50 mg total) by mouth 2 (two) times daily. Take with or immediately following a meal.     nitroGLYCERIN (NITROSTAT) 0.4 MG SL tablet Place 1 tablet (0.4 mg total) under the tongue every 5 (five) minutes as needed for chest pain. 25 tablet 3   omeprazole (PRILOSEC) 20 MG capsule Take 1 capsule (20 mg total) by mouth daily. 90 capsule 3   Polyethyl Glycol-Propyl Glycol (SYSTANE) 0.4-0.3 % GEL ophthalmic gel Place 1 application into both eyes.     potassium chloride (KLOR-CON) 10 MEQ tablet Take 1 tablet (10 mEq total) by mouth daily. 90 tablet 3   vitamin B-12 (CYANOCOBALAMIN) 1000 MCG tablet Take 1 tablet (1,000 mcg total) by mouth daily.     warfarin (COUMADIN) 5 MG tablet TAKE ONE AND ONE-HALF TABLET BY MOUTH DAILY EXCEPT TAKE 1 TABLET ON MONDAYS AND THURSDAYS OR AS DIRECTED BY ANTICOAGULATION CLINIC (Patient taking differently: Take 5-7.5 mg by mouth See admin instructions. Take 1 tablet ('5mg'$ ) by mouth every Tuesday, Wednesday, Thursday, Friday, Saturday and Sunday evening and take 1 tablets (7.'5mg'$ ) by mouth every Monday evening) 45 tablet 0   No current facility-administered medications for this visit.   Allergies:   Statins, Wheat, Amlodipine, Eucalyptus oil, Lisinopril, and Rice   Social History:  The patient  reports that she quit smoking about 50 years ago. Her smoking use included cigarettes. She has a 40.00 pack-year smoking history. She has never used smokeless tobacco. She reports that she does not currently use alcohol. She reports that she does not use drugs.   Family History:   family history includes Breast cancer in her daughter and sister; Heart disease in her father and mother.   Review of Systems  Constitutional: Negative.   HENT: Negative.    Respiratory: Negative.    Cardiovascular: Negative.   Gastrointestinal: Negative.   Musculoskeletal: Negative.        Leg weakness, poor balance  Neurological: Negative.   Psychiatric/Behavioral: Negative.    All other systems  reviewed and are negative.   PHYSICAL EXAM: VS:  BP (!) 110/54 (BP Location: Left Arm, Patient Position: Sitting, Cuff Size: Large)   Pulse (!) 59   Ht '5\' 1"'$  (1.549 m)   Wt 225 lb 4 oz (102.2 kg)   SpO2 95%   BMI 42.56 kg/m  , BMI Body mass index is 42.56 kg/m. Constitutional:  oriented to person, place, and time. No distress.  HENT:  Head: Grossly normal Eyes:  no discharge. No scleral icterus.  Neck: No JVD, no carotid bruits  Cardiovascular: Regular rate and rhythm, no murmurs appreciated Pulmonary/Chest: Clear to auscultation bilaterally, no wheezes or rails Abdominal: Soft.  no distension.  no tenderness.  Musculoskeletal: Normal range of motion Neurological:  normal muscle tone. Coordination normal. No atrophy Skin: Skin warm and dry Psychiatric: normal affect, pleasant   Recent Labs: 04/01/2022: ALT 10; B Natriuretic Peptide 696.7; Magnesium 2.1; TSH 2.542 04/02/2022: BUN 19; Creatinine, Ser 0.87; Hemoglobin 10.8; Platelets 257; Potassium 3.9; Sodium 136  Lipid Panel Lab Results  Component Value Date   CHOL 199 07/10/2021   HDL 49 (L) 07/10/2021   LDLCALC 127 (H) 07/10/2021   TRIG 124 07/10/2021      Wt Readings from Last 3 Encounters:  04/26/22 225 lb 4 oz (102.2 kg)  04/15/22 228 lb 2 oz (103.5 kg)  04/09/22 229 lb 6 oz (104 kg)     ASSESSMENT AND PLAN:  Paroxysmal atrial fibrillation (HCC) -  Recent hospitalization for atrial fibrillation with RVR, underwent cardioversion, Maintaining normal sinus rhythm on amiodarone, diltiazem, metoprolol succinate On warfarin  HLD (hyperlipidemia) Does not want a statin,  cholesterol around 200 We will need to readdress with her in light of cardiac CTA findings  Coronary artery disease with unstable angina Multivessel disease noted on cardiac CTA She is interested in additional evaluation given episodes of chest pain We will schedule her for cardiac catheterization as her schedule permits with family Hold  warfarin 5 days prior to procedure, restart following procedure  Essential hypertension Blood pressure is well controlled on today's visit. No changes made to the medications.  Morbid obesity Unable to exercise given gait limitations,  We recommended low carbohydrate intake  Leg swelling No significant leg swelling on today's visit  GERD Better on omeprazole   Total encounter time more than 40 minutes  Greater than 50% was spent in counseling and coordination of care with the patient   No orders of the defined types were placed in this encounter.    Signed, Esmond Plants, M.D., Ph.D. 04/26/2022  [place], [place]

## 2022-04-26 ENCOUNTER — Encounter: Payer: Self-pay | Admitting: Cardiovascular Disease

## 2022-04-26 ENCOUNTER — Ambulatory Visit: Payer: PPO | Admitting: Cardiovascular Disease

## 2022-04-26 VITALS — BP 110/54 | HR 59 | Ht 61.0 in | Wt 225.2 lb

## 2022-04-26 DIAGNOSIS — M791 Myalgia, unspecified site: Secondary | ICD-10-CM | POA: Diagnosis not present

## 2022-04-26 DIAGNOSIS — R5383 Other fatigue: Secondary | ICD-10-CM | POA: Diagnosis not present

## 2022-04-26 DIAGNOSIS — I200 Unstable angina: Secondary | ICD-10-CM | POA: Insufficient documentation

## 2022-04-26 DIAGNOSIS — I10 Essential (primary) hypertension: Secondary | ICD-10-CM | POA: Diagnosis not present

## 2022-04-26 DIAGNOSIS — I5032 Chronic diastolic (congestive) heart failure: Secondary | ICD-10-CM | POA: Diagnosis not present

## 2022-04-26 DIAGNOSIS — E782 Mixed hyperlipidemia: Secondary | ICD-10-CM | POA: Diagnosis not present

## 2022-04-26 DIAGNOSIS — T466X5D Adverse effect of antihyperlipidemic and antiarteriosclerotic drugs, subsequent encounter: Secondary | ICD-10-CM | POA: Diagnosis not present

## 2022-04-26 DIAGNOSIS — G4730 Sleep apnea, unspecified: Secondary | ICD-10-CM

## 2022-04-26 DIAGNOSIS — I480 Paroxysmal atrial fibrillation: Secondary | ICD-10-CM | POA: Diagnosis not present

## 2022-04-26 DIAGNOSIS — I25118 Atherosclerotic heart disease of native coronary artery with other forms of angina pectoris: Secondary | ICD-10-CM | POA: Diagnosis not present

## 2022-04-26 DIAGNOSIS — Z6841 Body Mass Index (BMI) 40.0 and over, adult: Secondary | ICD-10-CM

## 2022-04-26 MED ORDER — FUROSEMIDE 20 MG PO TABS: 20.0000 mg | ORAL_TABLET | Freq: Every day | ORAL | 3 refills | Status: DC

## 2022-04-26 NOTE — Patient Instructions (Signed)
Medication Instructions:  ?No changes ? ?If you need a refill on your cardiac medications before your next appointment, please call your pharmacy.  ? ?Lab work: ?Today: CBC, BMP, BNP  ? ?[facility] Entrance at [facility] ?1st desk on the right to check in, past the screening table ?Lab hours: Monday- Friday (7:30 am- 5:30 pm)  ? ?Testing/Procedures: ? ?[facility] ?[facility] ?[address], [address] ?[place] [address] ?Dept: [phone] ?Loc: [phone] ? ?Julie Phillips  04/26/2022 ? ?You are scheduled for a Cardiac Catheterization on Thursday, May 25 with Dr. [name]. ? ?1. Please arrive at the [place] at [facility] at [address] [place], [place] [address] at 11:30 AM (This time is one hour before your procedure to ensure your preparation). Free valet parking service is available.  ? ?Special note: Every effort is made to have your procedure done on time. Please understand that emergencies sometimes delay scheduled procedures. ? ?2. Diet: Do not eat solid foods after midnight.  You may have clear liquids until 5 AM upon the day of the procedure. ? ?3. Labs: You will need to have blood drawn on Monday, May 15 at [facility] Entrance, Go to 1st desk on your right to register.  ?Address: [address], [place] [address]  ?Open: 8am - 5pm  Phone: [phone]. You do not need to be fasting. ? ?4. Medication instructions in preparation for your procedure: ? ? Contrast Allergy: No ? ?Stop taking Coumadin (Warfarin) on Saturday, May 20. ? ?Stop taking, Cozaar (Losartan) Thursday, May 25,, Lasix (Furosemide)  Wednesday, May 24, ? ? ?On the morning of your procedure, take Aspirin and any morning medicines NOT listed above.  You may use sips of water. ? ?5. Plan to go home the same day, you will only stay overnight if medically necessary. ?6. You MUST have a responsible adult to drive you home. ?7. An adult MUST be with you  the first 24 hours after you arrive home. ?8. Bring a current list of your medications, and the last time and date medication taken. ?9. Bring ID and current insurance cards. ?10.Please wear clothes that are easy to get on and off and wear slip-on shoes. ? ?Thank you for allowing [place] to care for you! ?  -- [facility]  ? ?Follow-Up: ?At [facility], you and your health needs are our priority.  As part of our continuing mission to provide you with exceptional heart care, we have created designated Provider Care Teams.  These Care Teams include your primary Cardiologist (physician) and Advanced Practice Providers (APPs -  Physician Assistants and Nurse Practitioners) who all work together to provide you with the care you need, when you need it. ? ?You will need a follow up appointment in 1 month ? ?Providers on your designated Care Team:   ?Murray Hodgkins, NP ?Christell Faith, PA-C ?Cadence Kathlen Mody, PA-C ? ?COVID-19 Vaccine Information can be found at: ShippingScam.co.uk For questions related to vaccine distribution or appointments, please email [email or web address] or call [phone].  ? ?

## 2022-04-27 ENCOUNTER — Other Ambulatory Visit
Admission: RE | Admit: 2022-04-27 | Discharge: 2022-04-27 | Disposition: A | Discharge status: Home / Self Care | Payer: PPO | Attending: Cardiovascular Disease | Admitting: Cardiovascular Disease

## 2022-04-27 DIAGNOSIS — I25118 Atherosclerotic heart disease of native coronary artery with other forms of angina pectoris: Secondary | ICD-10-CM | POA: Diagnosis not present

## 2022-04-27 DIAGNOSIS — I480 Paroxysmal atrial fibrillation: Secondary | ICD-10-CM | POA: Diagnosis not present

## 2022-04-27 LAB — CBC
HCT: 37.3 % (ref 36.0–46.0)
Hemoglobin: 11.8 g/dL — ABNORMAL LOW (ref 12.0–15.0)
MCH: 27.3 pg (ref 26.0–34.0)
MCHC: 31.6 g/dL (ref 30.0–36.0)
MCV: 86.1 fL (ref 80.0–100.0)
Platelets: 336 10*3/uL (ref 150–400)
RBC: 4.33 MIL/uL (ref 3.87–5.11)
RDW: 15.3 % (ref 11.5–15.5)
WBC: 9.9 10*3/uL (ref 4.0–10.5)
nRBC: 0 % (ref 0.0–0.2)

## 2022-04-27 LAB — BASIC METABOLIC PANEL
Anion gap: 11 (ref 5–15)
BUN: 19 mg/dL (ref 8–23)
CO2: 23 mmol/L (ref 22–32)
Calcium: 9.6 mg/dL (ref 8.9–10.3)
Chloride: 104 mmol/L (ref 98–111)
Creatinine, Ser: 0.91 mg/dL (ref 0.44–1.00)
GFR, Estimated: >60 mL/min (ref >60)
Glucose, Bld: 104 mg/dL — ABNORMAL HIGH (ref 70–99)
Potassium: 4.3 mmol/L (ref 3.5–5.1)
Sodium: 138 mmol/L (ref 135–145)

## 2022-04-27 LAB — BRAIN NATRIURETIC PEPTIDE: B Natriuretic Peptide: 295.5 pg/mL — ABNORMAL HIGH (ref 0.0–100.0)

## 2022-04-29 ENCOUNTER — Ambulatory Visit (INDEPENDENT_AMBULATORY_CARE_PROVIDER_SITE_OTHER): Payer: PPO

## 2022-04-29 ENCOUNTER — Ambulatory Visit (HOSPITAL_COMMUNITY): Payer: PPO

## 2022-04-29 DIAGNOSIS — Z7901 Long term (current) use of anticoagulants: Secondary | ICD-10-CM | POA: Diagnosis not present

## 2022-04-29 LAB — POCT INR: INR: 1.9 — AB (ref 2.0–3.0)

## 2022-04-29 NOTE — Progress Notes (Signed)
Increase dose today to take 1 1/2 tablets and then follow dosing for procedure. Recheck in 2 weeks, on 6/1.  Pt having heart cath on 5/25 and cardiology asking for 5 day hold.  5/19: Take last dose of coumadin 5/20: No coumadin 5/21: No coumadin 5/22: No coumadin 5/23: No coumadin 5/24: No coumadin  5/25: Surgery Day; No coumadin  5/26: Take 1 1/2 tablets coumadin 5/27: Take 1 1/2 tablets coumadin 5/28: Take 1 1/2 tablets coumadin 5/29: Do not take any coumadin 5/30: Resume 1 tablet daily until recheck of INR oin 6/1  6/1: apt for INR check

## 2022-04-29 NOTE — Patient Instructions (Addendum)
Pre visit review using our clinic review tool, if applicable. No additional management support is needed unless otherwise documented below in the visit note.  Increase dose today to take 1 1/2 tablets and then follow dosing for procedure. Recheck in 2 weeks, on 6/1.  5/19: Take last dose of coumadin 5/20: No coumadin 5/21: No coumadin 5/22: No coumadin 5/23: No coumadin 5/24: No coumadin  5/25: Surgery Day; No coumadin  5/26: Take 1 1/2 tablets coumadin 5/27: Take 1 1/2 tablets coumadin 5/28: Take 1 1/2 tablets coumadin 5/29: Do not take any coumadin 5/30: Resume 1 tablet daily until recheck of INR oin 6/1  6/1: apt for INR check

## 2022-05-02 ENCOUNTER — Other Ambulatory Visit: Payer: Self-pay | Admitting: Cardiovascular Disease

## 2022-05-02 DIAGNOSIS — I200 Unstable angina: Secondary | ICD-10-CM

## 2022-05-03 ENCOUNTER — Telehealth: Payer: Self-pay | Admitting: Emergency Medicine

## 2022-05-03 NOTE — Telephone Encounter (Signed)
-----   Message from Minna Merritts, MD sent at 05/02/2022  1:25 PM EDT ----- Regarding: warfarin Can we confirm she stopped her warfarin for cath 5/25 with arida Thx TG

## 2022-05-03 NOTE — Telephone Encounter (Signed)
Called and spoke with patient. Confirmed that patient stopped taking Coumadin on 5/20.   Answered some questions that patient had prior to cath.   Pt voiced appreciation for call.

## 2022-05-05 ENCOUNTER — Telehealth: Payer: Self-pay | Admitting: Cardiovascular Disease

## 2022-05-05 NOTE — Telephone Encounter (Signed)
Patient calling bout her procedure on tomorrow and a bill that she took. She not sure if she was suppose to take the pill. Please advise

## 2022-05-05 NOTE — Telephone Encounter (Signed)
I called and spoke with the patient. She looked back over her pre-cath instructions for tomorrow and advised she did not see to hold lasix today. She took her lasix this morning, but she has been holding warfarin as directed.  I advised the patient she is still ok for her cath as planned tomorrow, but advised not to take furosemide in the AM.  The patient voices understanding and is agreeable.

## 2022-05-06 ENCOUNTER — Other Ambulatory Visit: Payer: Self-pay

## 2022-05-06 ENCOUNTER — Ambulatory Visit
Admission: RE | Admit: 2022-05-06 | Discharge: 2022-05-07 | Disposition: A | Discharge status: Home / Self Care | Payer: PPO | Attending: Cardiovascular Disease | Admitting: Cardiovascular Disease

## 2022-05-06 ENCOUNTER — Encounter: Payer: Self-pay | Admitting: Cardiovascular Disease

## 2022-05-06 ENCOUNTER — Encounter
Admission: RE | Disposition: A | Discharge status: Home / Self Care | Payer: Self-pay | Source: Home / Self Care | Attending: Cardiovascular Disease

## 2022-05-06 DIAGNOSIS — Z87891 Personal history of nicotine dependence: Secondary | ICD-10-CM | POA: Insufficient documentation

## 2022-05-06 DIAGNOSIS — I200 Unstable angina: Secondary | ICD-10-CM | POA: Insufficient documentation

## 2022-05-06 DIAGNOSIS — M7989 Other specified soft tissue disorders: Secondary | ICD-10-CM | POA: Insufficient documentation

## 2022-05-06 DIAGNOSIS — Z7901 Long term (current) use of anticoagulants: Secondary | ICD-10-CM | POA: Diagnosis not present

## 2022-05-06 DIAGNOSIS — I110 Hypertensive heart disease with heart failure: Secondary | ICD-10-CM | POA: Diagnosis not present

## 2022-05-06 DIAGNOSIS — Z79899 Other long term (current) drug therapy: Secondary | ICD-10-CM | POA: Insufficient documentation

## 2022-05-06 DIAGNOSIS — K219 Gastro-esophageal reflux disease without esophagitis: Secondary | ICD-10-CM | POA: Diagnosis not present

## 2022-05-06 DIAGNOSIS — Z6841 Body Mass Index (BMI) 40.0 and over, adult: Secondary | ICD-10-CM | POA: Diagnosis not present

## 2022-05-06 DIAGNOSIS — I2582 Chronic total occlusion of coronary artery: Secondary | ICD-10-CM | POA: Insufficient documentation

## 2022-05-06 DIAGNOSIS — I2584 Coronary atherosclerosis due to calcified coronary lesion: Secondary | ICD-10-CM | POA: Insufficient documentation

## 2022-05-06 DIAGNOSIS — R9389 Abnormal findings on diagnostic imaging of other specified body structures: Secondary | ICD-10-CM | POA: Diagnosis not present

## 2022-05-06 DIAGNOSIS — I25119 Atherosclerotic heart disease of native coronary artery with unspecified angina pectoris: Secondary | ICD-10-CM

## 2022-05-06 DIAGNOSIS — I4891 Unspecified atrial fibrillation: Secondary | ICD-10-CM

## 2022-05-06 DIAGNOSIS — Z7902 Long term (current) use of antithrombotics/antiplatelets: Secondary | ICD-10-CM | POA: Diagnosis not present

## 2022-05-06 DIAGNOSIS — E785 Hyperlipidemia, unspecified: Secondary | ICD-10-CM | POA: Insufficient documentation

## 2022-05-06 DIAGNOSIS — I208 Other forms of angina pectoris: Secondary | ICD-10-CM | POA: Diagnosis present

## 2022-05-06 DIAGNOSIS — I25118 Atherosclerotic heart disease of native coronary artery with other forms of angina pectoris: Secondary | ICD-10-CM | POA: Insufficient documentation

## 2022-05-06 DIAGNOSIS — R269 Unspecified abnormalities of gait and mobility: Secondary | ICD-10-CM | POA: Insufficient documentation

## 2022-05-06 DIAGNOSIS — I2089 Other forms of angina pectoris: Secondary | ICD-10-CM | POA: Diagnosis present

## 2022-05-06 DIAGNOSIS — Z9861 Coronary angioplasty status: Secondary | ICD-10-CM | POA: Diagnosis not present

## 2022-05-06 DIAGNOSIS — I10 Essential (primary) hypertension: Secondary | ICD-10-CM | POA: Diagnosis not present

## 2022-05-06 DIAGNOSIS — R0602 Shortness of breath: Secondary | ICD-10-CM | POA: Insufficient documentation

## 2022-05-06 HISTORY — PX: CORONARY STENT INTERVENTION: CATH118234

## 2022-05-06 HISTORY — PX: LEFT HEART CATH AND CORONARY ANGIOGRAPHY: CATH118249

## 2022-05-06 LAB — CBC
HCT: 21.6 % — ABNORMAL LOW (ref 36.0–46.0)
Hemoglobin: 6.2 g/dL — ABNORMAL LOW (ref 12.0–15.0)
MCH: 20.3 pg — ABNORMAL LOW (ref 26.0–34.0)
MCHC: 28.7 g/dL — ABNORMAL LOW (ref 30.0–36.0)
MCV: 70.8 fL — ABNORMAL LOW (ref 80.0–100.0)
Platelets: 443 10*3/uL — ABNORMAL HIGH (ref 150–400)
RBC: 3.05 MIL/uL — ABNORMAL LOW (ref 3.87–5.11)
RDW: 22.6 % — ABNORMAL HIGH (ref 11.5–15.5)
WBC: 8.2 10*3/uL (ref 4.0–10.5)
nRBC: 0.6 % — ABNORMAL HIGH (ref 0.0–0.2)

## 2022-05-06 LAB — POCT ACTIVATED CLOTTING TIME: Activated Clotting Time: 311 seconds

## 2022-05-06 SURGERY — LEFT HEART CATH AND CORONARY ANGIOGRAPHY
Anesthesia: Moderate Sedation

## 2022-05-06 MED ORDER — CLOPIDOGREL BISULFATE 75 MG PO TABS
75.0000 mg | ORAL_TABLET | Freq: Every day | ORAL | Status: DC
  Administered 2022-05-07: 75 mg via ORAL
  Filled 2022-05-06: qty 1

## 2022-05-06 MED ORDER — MIDAZOLAM HCL 2 MG/2ML IJ SOLN
INTRAMUSCULAR | Status: AC
  Filled 2022-05-06: qty 2

## 2022-05-06 MED ORDER — PANTOPRAZOLE SODIUM 40 MG PO TBEC
40.0000 mg | DELAYED_RELEASE_TABLET | Freq: Every day | ORAL | Status: DC
  Administered 2022-05-06 – 2022-05-07 (×2): 40 mg via ORAL
  Filled 2022-05-06 (×2): qty 1

## 2022-05-06 MED ORDER — ACETAMINOPHEN 325 MG PO TABS: 650.0000 mg | ORAL_TABLET | ORAL | Status: DC | PRN

## 2022-05-06 MED ORDER — SODIUM CHLORIDE 0.9% FLUSH
3.0000 mL | Freq: Two times a day (BID) | INTRAVENOUS | Status: DC
  Administered 2022-05-07: 3 mL via INTRAVENOUS

## 2022-05-06 MED ORDER — ASPIRIN 81 MG PO CHEW
CHEWABLE_TABLET | ORAL | Status: AC
  Filled 2022-05-06: qty 1

## 2022-05-06 MED ORDER — HEPARIN SODIUM (PORCINE) 1000 UNIT/ML IJ SOLN
INTRAMUSCULAR | Status: AC
  Filled 2022-05-06: qty 10

## 2022-05-06 MED ORDER — SODIUM CHLORIDE 0.9 % WEIGHT BASED INFUSION
3.0000 mL/kg/h | INTRAVENOUS | Status: DC
  Administered 2022-05-06: 3 mL/kg/h via INTRAVENOUS

## 2022-05-06 MED ORDER — GABAPENTIN 100 MG PO CAPS
100.0000 mg | ORAL_CAPSULE | Freq: Three times a day (TID) | ORAL | Status: DC
Start: 2022-05-06 — End: 2022-05-07
  Administered 2022-05-06 – 2022-05-07 (×3): 100 mg via ORAL
  Filled 2022-05-06 (×3): qty 1

## 2022-05-06 MED ORDER — SODIUM CHLORIDE 0.9 % IV SOLN: INTRAVENOUS | Status: AC

## 2022-05-06 MED ORDER — CLOPIDOGREL BISULFATE 75 MG PO TABS
ORAL_TABLET | ORAL | Status: DC | PRN
  Administered 2022-05-06: 600 mg via ORAL

## 2022-05-06 MED ORDER — WARFARIN SODIUM 5 MG PO TABS
5.0000 mg | ORAL_TABLET | Freq: Once | ORAL | Status: AC
  Administered 2022-05-06: 5 mg via ORAL
  Filled 2022-05-06: qty 1

## 2022-05-06 MED ORDER — SODIUM CHLORIDE 0.9% FLUSH: 3.0000 mL | INTRAVENOUS | Status: DC | PRN

## 2022-05-06 MED ORDER — ALUM & MAG HYDROXIDE-SIMETH 200-200-20 MG/5ML PO SUSP
ORAL | Status: DC | PRN
  Administered 2022-05-06: 30 mL via ORAL

## 2022-05-06 MED ORDER — WARFARIN - PHARMACIST DOSING INPATIENT: Freq: Every day | Status: DC

## 2022-05-06 MED ORDER — AMIODARONE HCL 200 MG PO TABS
200.0000 mg | ORAL_TABLET | Freq: Every day | ORAL | Status: DC
  Administered 2022-05-07: 200 mg via ORAL
  Filled 2022-05-06 (×2): qty 1

## 2022-05-06 MED ORDER — SODIUM CHLORIDE 0.9 % WEIGHT BASED INFUSION: 1.0000 mL/kg/h | INTRAVENOUS | Status: DC

## 2022-05-06 MED ORDER — FENTANYL CITRATE (PF) 100 MCG/2ML IJ SOLN
INTRAMUSCULAR | Status: AC
  Filled 2022-05-06: qty 2

## 2022-05-06 MED ORDER — CLOPIDOGREL BISULFATE 75 MG PO TABS
ORAL_TABLET | ORAL | Status: AC
  Filled 2022-05-06: qty 8

## 2022-05-06 MED ORDER — METOPROLOL SUCCINATE ER 50 MG PO TB24
50.0000 mg | ORAL_TABLET | Freq: Two times a day (BID) | ORAL | Status: DC
  Administered 2022-05-06 – 2022-05-07 (×2): 50 mg via ORAL
  Filled 2022-05-06 (×2): qty 1

## 2022-05-06 MED ORDER — LIDOCAINE HCL 1 % IJ SOLN
INTRAMUSCULAR | Status: AC
  Filled 2022-05-06: qty 20

## 2022-05-06 MED ORDER — HEPARIN (PORCINE) IN NACL 1000-0.9 UT/500ML-% IV SOLN
INTRAVENOUS | Status: DC | PRN
  Administered 2022-05-06: 1000 mL

## 2022-05-06 MED ORDER — DILTIAZEM HCL ER COATED BEADS 120 MG PO CP24
120.0000 mg | ORAL_CAPSULE | Freq: Every day | ORAL | Status: DC
  Administered 2022-05-07: 120 mg via ORAL
  Filled 2022-05-06 (×2): qty 1

## 2022-05-06 MED ORDER — FENTANYL CITRATE (PF) 100 MCG/2ML IJ SOLN
INTRAMUSCULAR | Status: DC | PRN
  Administered 2022-05-06: 25 ug via INTRAVENOUS

## 2022-05-06 MED ORDER — POTASSIUM CHLORIDE CRYS ER 10 MEQ PO TBCR
10.0000 meq | EXTENDED_RELEASE_TABLET | Freq: Every day | ORAL | Status: DC
  Administered 2022-05-06 – 2022-05-07 (×2): 10 meq via ORAL
  Filled 2022-05-06 (×5): qty 1

## 2022-05-06 MED ORDER — IOHEXOL 300 MG/ML  SOLN
INTRAMUSCULAR | Status: DC | PRN
  Administered 2022-05-06: 131 mL

## 2022-05-06 MED ORDER — LIDOCAINE HCL (PF) 1 % IJ SOLN
INTRAMUSCULAR | Status: DC | PRN
  Administered 2022-05-06: 2 mL

## 2022-05-06 MED ORDER — ONDANSETRON HCL 4 MG/2ML IJ SOLN: 4.0000 mg | Freq: Four times a day (QID) | INTRAMUSCULAR | Status: DC | PRN

## 2022-05-06 MED ORDER — LOSARTAN POTASSIUM 50 MG PO TABS
100.0000 mg | ORAL_TABLET | Freq: Every day | ORAL | Status: DC
  Administered 2022-05-06 – 2022-05-07 (×2): 100 mg via ORAL
  Filled 2022-05-06 (×3): qty 2

## 2022-05-06 MED ORDER — SODIUM CHLORIDE 0.9 % IV SOLN: 250.0000 mL | INTRAVENOUS | Status: DC | PRN

## 2022-05-06 MED ORDER — NITROGLYCERIN 0.4 MG SL SUBL: 0.4000 mg | SUBLINGUAL_TABLET | SUBLINGUAL | Status: DC | PRN

## 2022-05-06 MED ORDER — VERAPAMIL HCL 2.5 MG/ML IV SOLN
INTRAVENOUS | Status: AC
  Filled 2022-05-06: qty 2

## 2022-05-06 MED ORDER — FUROSEMIDE 20 MG PO TABS
20.0000 mg | ORAL_TABLET | Freq: Every day | ORAL | Status: DC
  Administered 2022-05-06 – 2022-05-07 (×2): 20 mg via ORAL
  Filled 2022-05-06 (×2): qty 1

## 2022-05-06 MED ORDER — HEPARIN (PORCINE) IN NACL 1000-0.9 UT/500ML-% IV SOLN
INTRAVENOUS | Status: AC
  Filled 2022-05-06: qty 1000

## 2022-05-06 MED ORDER — ALUM & MAG HYDROXIDE-SIMETH 200-200-20 MG/5ML PO SUSP
ORAL | Status: AC
  Filled 2022-05-06: qty 30

## 2022-05-06 MED ORDER — HEPARIN SODIUM (PORCINE) 1000 UNIT/ML IJ SOLN
INTRAMUSCULAR | Status: DC | PRN
  Administered 2022-05-06 (×2): 5000 [IU] via INTRAVENOUS

## 2022-05-06 MED ORDER — LEVOTHYROXINE SODIUM 100 MCG PO TABS
100.0000 ug | ORAL_TABLET | Freq: Every day | ORAL | Status: DC
  Administered 2022-05-07: 100 ug via ORAL
  Filled 2022-05-06 (×2): qty 1

## 2022-05-06 MED ORDER — ASPIRIN 81 MG PO CHEW: 81.0000 mg | CHEWABLE_TABLET | ORAL | Status: DC

## 2022-05-06 MED ORDER — VERAPAMIL HCL 2.5 MG/ML IV SOLN
INTRAVENOUS | Status: DC | PRN
  Administered 2022-05-06: 2.5 mg via INTRA_ARTERIAL

## 2022-05-06 MED ORDER — ASPIRIN 81 MG PO CHEW
81.0000 mg | CHEWABLE_TABLET | Freq: Every day | ORAL | Status: DC
  Administered 2022-05-06 – 2022-05-07 (×2): 81 mg via ORAL
  Filled 2022-05-06: qty 1

## 2022-05-06 MED ORDER — MIDAZOLAM HCL 2 MG/2ML IJ SOLN
INTRAMUSCULAR | Status: DC | PRN
  Administered 2022-05-06: 1 mg via INTRAVENOUS

## 2022-05-06 SURGICAL SUPPLY — 21 items
BALLN SCOREFLEX 2.75X15 (BALLOONS) ×2
BALLN TREK RX 2.5X12 (BALLOONS) ×2
BALLN ~~LOC~~ TREK NEO RX 3.25X15 (BALLOONS) ×1 IMPLANT
BALLOON SCOREFLEX 2.75X15 (BALLOONS) IMPLANT
BALLOON TREK RX 2.5X12 (BALLOONS) IMPLANT
CATH INFINITI 5FR JK (CATHETERS) ×1 IMPLANT
CATH LAUNCHER 6FR EBU3.5 (CATHETERS) ×1 IMPLANT
DEVICE RAD TR BAND REGULAR (VASCULAR PRODUCTS) ×1 IMPLANT
DRAPE BRACHIAL (DRAPES) ×1 IMPLANT
GLIDESHEATH SLEND SS 6F .021 (SHEATH) ×1 IMPLANT
GUIDEWIRE INQWIRE 1.5J.035X260 (WIRE) IMPLANT
INQWIRE 1.5J .035X260CM (WIRE) ×2
KIT ENCORE 26 ADVANTAGE (KITS) ×1 IMPLANT
PACK CARDIAC CATH (CUSTOM PROCEDURE TRAY) ×2 IMPLANT
PANNUS RETENTION SYSTEM 2 PAD (MISCELLANEOUS) ×1 IMPLANT
PROTECTION STATION PRESSURIZED (MISCELLANEOUS) ×2
SET ATX SIMPLICITY (MISCELLANEOUS) ×1 IMPLANT
STATION PROTECTION PRESSURIZED (MISCELLANEOUS) IMPLANT
STENT ONYX FRONTIER 3.0X26 (Permanent Stent) ×1 IMPLANT
TUBING CIL FLEX 10 FLL-RA (TUBING) ×1 IMPLANT
WIRE RUNTHROUGH .014X180CM (WIRE) ×1 IMPLANT

## 2022-05-06 NOTE — Interval H&P Note (Signed)
Cath Lab Visit (complete for each Cath Lab visit)  Clinical Evaluation Leading to the Procedure:   ACS: No.  Non-ACS:    Anginal Classification: CCS III  Anti-ischemic medical therapy: Maximal Therapy (2 or more classes of medications)  Non-Invasive Test Results: Intermediate-risk stress test findings: cardiac mortality 1-3%/year  Prior CABG: No previous CABG      History and Physical Interval Note:  05/06/2022 4:17 PM  Julie Phillips  has presented today for surgery, with the diagnosis of LT Heart Cath   Positive Cardiac CTA.  The various methods of treatment have been discussed with the patient and family. After consideration of risks, benefits and other options for treatment, the patient has consented to  Procedure(s): LEFT HEART CATH AND CORONARY ANGIOGRAPHY (N/A) as a surgical intervention.  The patient's history has been reviewed, patient examined, no change in status, stable for surgery.  I have reviewed the patient's chart and labs.  Questions were answered to the patient's satisfaction.     Julie Phillips

## 2022-05-06 NOTE — Progress Notes (Signed)
ANTICOAGULATION CONSULT NOTE - Initial Consult  Pharmacy Consult for warfarin Indication: atrial fibrillation  Allergies  Allergen Reactions   Statins     Myalgias   Wheat     Throat swelling   Amlodipine     hematuria   Eucalyptus Oil    Lisinopril     Unrecalled BP med- possibly lisinopril- intolerant, had cough on the medicine- changed to cozaar   Rice Nausea And Vomiting    Patient Measurements: Height: '5\' 1"'$  (154.9 cm) Weight: 101.6 kg (224 lb) IBW/kg (Calculated) : 47.8  Vital Signs: Temp: 98.4 F (36.9 C) (05/25 1223) Temp Source: Oral (05/25 1223) BP: 160/59 (05/25 1730) Pulse Rate: 57 (05/25 1730)  Labs: No results for input(s): HGB, HCT, PLT, APTT, LABPROT, INR, HEPARINUNFRC, HEPRLOWMOCWT, CREATININE, CKTOTAL, CKMB, TROPONINIHS in the last 72 hours.  Estimated Creatinine Clearance: 47.6 mL/min (by C-G formula based on SCr of 0.91 mg/dL).   Medical History: Past Medical History:  Diagnosis Date   Arrhythmia    atrial fibrillation   CHF (congestive heart failure) (HCC)    Chronic heart failure with preserved ejection fraction (HFpEF) ([place])    a. 01/2022 Echo: EF 65-70%, no rwma, mild LVH, GrII DD, nl RV fxn, mildly dil LA, mild MR, ao scelrosis.   Fall    GERD (gastroesophageal reflux disease)    History of chicken pox    Hx of colonic polyps    Hyperlipidemia    Hypertension    Hypothyroidism    Keratopathy 2018   R eye   LVH (left ventricular hypertrophy)    Persistent atrial fibrillation ([place])    a. Initially dx in 2012; b. 01/2022 recurrent AF rvr noted-->warfarin added (CHA2DS2VASc = 5); c. 03/2022 Amio added-->DCCV x 1 (150J).    Medications:  Medications Prior to Admission  Medication Sig Dispense Refill Last Dose   amiodarone (PACERONE) 200 MG tablet Take 1 tablet (200 mg total) by mouth daily. 90 tablet 3 05/06/2022   diltiazem (CARDIZEM CD) 120 MG 24 hr capsule Take 1 capsule (120 mg total) by mouth daily. 90 capsule 3 05/05/2022    diphenhydrAMINE (BENADRYL) 25 mg capsule Take 1 capsule (25 mg total) by mouth daily as needed.   05/05/2022   furosemide (LASIX) 20 MG tablet Take 1 tablet (20 mg total) by mouth daily. 90 tablet 3 05/05/2022   gabapentin (NEURONTIN) 100 MG capsule TAKE 1 CAPSULE BY MOUTH 3 TIMES DAILY ASNEEDED 270 capsule 3 05/05/2022   levothyroxine (SYNTHROID) 100 MCG tablet TAKE 1 TABLET BY MOUTH ONCE A DAY BEFOREBREAKFAST. 90 tablet 3 05/06/2022   loratadine (CLARITIN) 10 MG tablet Take 1 tablet (10 mg total) by mouth daily as needed.   Past Week   losartan (COZAAR) 100 MG tablet Take 1 tablet (100 mg total) by mouth daily. 90 tablet 3 05/05/2022   metoprolol succinate (TOPROL-XL) 50 MG 24 hr tablet Take 1 tablet (50 mg total) by mouth 2 (two) times daily. Take with or immediately following a meal.   05/06/2022   omeprazole (PRILOSEC) 20 MG capsule Take 1 capsule (20 mg total) by mouth daily. 90 capsule 3 05/05/2022   Polyethyl Glycol-Propyl Glycol (SYSTANE) 0.4-0.3 % GEL ophthalmic gel Place 1 application into both eyes.   05/05/2022   potassium chloride (KLOR-CON) 10 MEQ tablet Take 1 tablet (10 mEq total) by mouth daily. 90 tablet 3 05/05/2022   warfarin (COUMADIN) 5 MG tablet TAKE ONE AND ONE-HALF TABLET BY MOUTH DAILY EXCEPT TAKE 1 TABLET ON MONDAYS AND THURSDAYS  OR AS DIRECTED BY ANTICOAGULATION CLINIC (Patient taking differently: Take 5-7.5 mg by mouth See admin instructions. Take 1 tablet ('5mg'$ ) by mouth every Tuesday, Wednesday, Thursday, Friday, Saturday and Sunday evening and take 1 tablets (7.'5mg'$ ) by mouth every Monday evening) 45 tablet 0 Past Week   nitroGLYCERIN (NITROSTAT) 0.4 MG SL tablet Place 1 tablet (0.4 mg total) under the tongue every 5 (five) minutes as needed for chest pain. 25 tablet 3    Scheduled:   alum & mag hydroxide-simeth       amiodarone  200 mg Oral Daily   aspirin  81 mg Oral Daily   [START ON 05/07/2022] clopidogrel  75 mg Oral Q breakfast   [START ON 05/07/2022] diltiazem  120 mg  Oral Daily   furosemide  20 mg Oral Daily   gabapentin  100 mg Oral TID   [START ON 05/07/2022] levothyroxine  100 mcg Oral Q0600   losartan  100 mg Oral Daily   metoprolol succinate  50 mg Oral BID   pantoprazole  40 mg Oral Daily   potassium chloride  10 mEq Oral Daily   sodium chloride flush  3 mL Intravenous Q12H   warfarin  5 mg Oral Once   [START ON 05/07/2022] Warfarin - Pharmacist Dosing Inpatient   Does not apply q1600   Infusions:   sodium chloride     sodium chloride     PRN: sodium chloride, acetaminophen, nitroGLYCERIN, ondansetron (ZOFRAN) IV, sodium chloride flush  Assessment: 88 year old female post-catheterization. PMH relevant for atrial fibrillation (CHADSVASC 5 on warfarin PTA), hypertension. Cardiac CTA with significant stenosis in mid to distal LAD and significant proximal stenosis in LCX.   Expect baseline INR to be WNL due to holding warfarin prior to procedure.  DDI  Aspirin (new): increased bleeding risk Clopidogrel (new): increased bleeding risk Amiodarone (PTA medication): increased warfarin concentration   Goal of Therapy:  INR 2-3 Monitor platelets by anticoagulation protocol: Yes   Plan:  Give warfarin 5 mg today (home dose). Follow up INR, CBC in AM   [name], PharmD Pharmacy Resident  05/06/2022 6:08 PM

## 2022-05-07 ENCOUNTER — Encounter: Payer: Self-pay | Admitting: Cardiovascular Disease

## 2022-05-07 DIAGNOSIS — R0602 Shortness of breath: Secondary | ICD-10-CM

## 2022-05-07 DIAGNOSIS — I4891 Unspecified atrial fibrillation: Secondary | ICD-10-CM | POA: Diagnosis not present

## 2022-05-07 DIAGNOSIS — I25118 Atherosclerotic heart disease of native coronary artery with other forms of angina pectoris: Secondary | ICD-10-CM | POA: Diagnosis not present

## 2022-05-07 DIAGNOSIS — I208 Other forms of angina pectoris: Secondary | ICD-10-CM | POA: Diagnosis not present

## 2022-05-07 DIAGNOSIS — I25119 Atherosclerotic heart disease of native coronary artery with unspecified angina pectoris: Secondary | ICD-10-CM

## 2022-05-07 LAB — CBC
HCT: 32.4 % — ABNORMAL LOW (ref 36.0–46.0)
Hemoglobin: 10 g/dL — ABNORMAL LOW (ref 12.0–15.0)
MCH: 27.2 pg (ref 26.0–34.0)
MCHC: 30.9 g/dL (ref 30.0–36.0)
MCV: 88 fL (ref 80.0–100.0)
Platelets: 248 10*3/uL (ref 150–400)
RBC: 3.68 MIL/uL — ABNORMAL LOW (ref 3.87–5.11)
RDW: 15.3 % (ref 11.5–15.5)
WBC: 8.6 10*3/uL (ref 4.0–10.5)
nRBC: 0 % (ref 0.0–0.2)

## 2022-05-07 LAB — BASIC METABOLIC PANEL
Anion gap: 6 (ref 5–15)
BUN: 13 mg/dL (ref 8–23)
CO2: 25 mmol/L (ref 22–32)
Calcium: 8.8 mg/dL — ABNORMAL LOW (ref 8.9–10.3)
Chloride: 107 mmol/L (ref 98–111)
Creatinine, Ser: 0.68 mg/dL (ref 0.44–1.00)
GFR, Estimated: >60 mL/min (ref >60)
Glucose, Bld: 86 mg/dL (ref 70–99)
Potassium: 4 mmol/L (ref 3.5–5.1)
Sodium: 138 mmol/L (ref 135–145)

## 2022-05-07 LAB — PROTIME-INR
INR: 1.2 (ref 0.8–1.2)
Prothrombin Time: 15.4 seconds — ABNORMAL HIGH (ref 11.4–15.2)

## 2022-05-07 MED ORDER — WARFARIN SODIUM 7.5 MG PO TABS
7.5000 mg | ORAL_TABLET | Freq: Once | ORAL | Status: DC
  Filled 2022-05-07: qty 1

## 2022-05-07 MED ORDER — EZETIMIBE 10 MG PO TABS: 10.0000 mg | ORAL_TABLET | Freq: Every day | ORAL | Status: DC

## 2022-05-07 MED ORDER — ASPIRIN 81 MG PO CHEW: 81.0000 mg | CHEWABLE_TABLET | Freq: Every day | ORAL | 1 refills | Status: DC

## 2022-05-07 MED ORDER — CLOPIDOGREL BISULFATE 75 MG PO TABS: 75.0000 mg | ORAL_TABLET | Freq: Every day | ORAL | 6 refills | Status: DC

## 2022-05-07 NOTE — TOC Initial Note (Signed)
Transition of Care [facility]) - Initial/Assessment Note    Patient Details  Name: Julie Phillips MRN: [id number] Date of Birth: 04/12/1932  Transition of Care [facility]) CM/SW Contact:    Laurena Slimmer, RN Phone Number: 05/07/2022, 9:34 AM  Clinical Narrative:                  Transition of Care [facility]) Screening Note   Patient Details  Name: Julie Phillips Date of Birth: [birth year]   Transition of Care [facility]) CM/SW Contact:    Laurena Slimmer, RN Phone Number: 05/07/2022, 9:34 AM    Transition of Care Department [facility]) has reviewed patient and no TOC needs have been identified at this time. We will continue to monitor patient advancement through interdisciplinary progression rounds. If new patient transition needs arise, please place a TOC consult.          Patient Goals and CMS Choice        Expected Discharge Plan and Services           Expected Discharge Date: 05/07/22                                    Prior Living Arrangements/Services                       Activities of Daily Living Home Assistive Devices/Equipment: Gilford Rile (specify type) ADL Screening (condition at time of admission) Patient's cognitive ability adequate to safely complete daily activities?: Yes Is the patient deaf or have difficulty hearing?: No Does the patient have difficulty seeing, even when wearing glasses/contacts?: No Does the patient have difficulty concentrating, remembering, or making decisions?: No Patient able to express need for assistance with ADLs?: Yes Does the patient have difficulty dressing or bathing?: No Independently performs ADLs?: Yes (appropriate for developmental age) Does the patient have difficulty walking or climbing stairs?: No Weakness of Legs: None Weakness of Arms/Hands: None  Permission Sought/Granted                  Emotional Assessment              Admission diagnosis:  Effort angina ([place]) [I20.8] Patient Active Problem  List   Diagnosis Date Noted   Coronary artery disease involving native coronary artery of native heart with angina pectoris ([place]) 05/07/2022   Effort angina ([place]) 05/06/2022   Unstable angina ([place]) 04/26/2022   Atrial fibrillation ([place]) 04/02/2022   Acute on chronic diastolic CHF (congestive heart failure) ([place]) 04/01/2022   Atrial fibrillation with RVR ([place]) 02/08/2022   Elevated troponin 02/08/2022   Tremor of both hands 07/12/2021   GERD (gastroesophageal reflux disease) 07/18/2019   Hyperglycemia 07/09/2018   Vitamin D deficiency 07/09/2018   Sciatica of left side 70/96/2836   Umbilical abnormality 62/94/7654   Healthcare maintenance 06/28/2017   Toenail avulsion 06/28/2017   DNR (do not resuscitate) 06/27/2017   Band keratopathy 01/16/2017   Risk for falls 06/21/2016   Rotator cuff disorder 12/10/2015   Shortness of breath 12/10/2015   Osteopenia 02/13/2015   Advance care planning 01/15/2015   Aspiration into airway 10/08/2013   Food allergy 07/09/2013   Medicare annual wellness visit, subsequent 12/26/2012   Knee pain 07/20/2012   Rash 04/11/2012   Hypothyroid 04/11/2012   LVH (left ventricular hypertrophy) 01/17/2012   Chronic atrial fibrillation with RVR ([place]) 01/06/2012   Obesity 01/06/2012   Essential  hypertension 01/06/2012   Mixed hyperlipidemia 01/06/2012   PCP:  Tonia Ghent, MD Pharmacy:   [facility] [address], [place] [address] Phone: [phone] Fax: [place], [place] [address] [place] [address] Phone: [phone] Fax: [phone]     Social Determinants of Health (SDOH) Interventions    Readmission Risk Interventions     View : No data to display.

## 2022-05-07 NOTE — Progress Notes (Signed)
[place] for warfarin Indication: atrial fibrillation  Allergies  Allergen Reactions   Statins     Myalgias   Wheat     Throat swelling   Amlodipine     hematuria   Eucalyptus Oil    Lisinopril     Unrecalled BP med- possibly lisinopril- intolerant, had cough on the medicine- changed to cozaar   Rice Nausea And Vomiting    Patient Measurements: Height: '5\' 1"'$  (154.9 cm) Weight: 101.6 kg (224 lb) IBW/kg (Calculated) : 47.8  Vital Signs: Temp: 97.7 F (36.5 C) (05/26 0351) Temp Source: Oral (05/26 0351) BP: 144/67 (05/26 0351) Pulse Rate: 60 (05/26 0351)  Labs: Recent Labs    05/06/22 2124 05/07/22 0606  HGB 6.2* 10.0*  HCT 21.6* 32.4*  PLT 443* 248  LABPROT  --  15.4*  INR  --  1.2  CREATININE  --  0.68    Estimated Creatinine Clearance: 54.2 mL/min (by C-G formula based on SCr of 0.68 mg/dL).   Medical History: Past Medical History:  Diagnosis Date   Arrhythmia    atrial fibrillation   CHF (congestive heart failure) (HCC)    Chronic heart failure with preserved ejection fraction (HFpEF) ([place])    a. 01/2022 Echo: EF 65-70%, no rwma, mild LVH, GrII DD, nl RV fxn, mildly dil LA, mild MR, ao scelrosis.   Fall    GERD (gastroesophageal reflux disease)    History of chicken pox    Hx of colonic polyps    Hyperlipidemia    Hypertension    Hypothyroidism    Keratopathy 2018   R eye   LVH (left ventricular hypertrophy)    Persistent atrial fibrillation ([place])    a. Initially dx in 2012; b. 01/2022 recurrent AF rvr noted-->warfarin added (CHA2DS2VASc = 5); c. 03/2022 Amio added-->DCCV x 1 (150J).    Medications:  Medications Prior to Admission  Medication Sig Dispense Refill Last Dose   amiodarone (PACERONE) 200 MG tablet Take 1 tablet (200 mg total) by mouth daily. 90 tablet 3 05/06/2022   diltiazem (CARDIZEM CD) 120 MG 24 hr capsule Take 1 capsule (120 mg total) by mouth daily. 90 capsule 3 05/05/2022   diphenhydrAMINE  (BENADRYL) 25 mg capsule Take 1 capsule (25 mg total) by mouth daily as needed.   05/05/2022   furosemide (LASIX) 20 MG tablet Take 1 tablet (20 mg total) by mouth daily. 90 tablet 3 05/05/2022   gabapentin (NEURONTIN) 100 MG capsule TAKE 1 CAPSULE BY MOUTH 3 TIMES DAILY ASNEEDED 270 capsule 3 05/05/2022   levothyroxine (SYNTHROID) 100 MCG tablet TAKE 1 TABLET BY MOUTH ONCE A DAY BEFOREBREAKFAST. 90 tablet 3 05/06/2022   loratadine (CLARITIN) 10 MG tablet Take 1 tablet (10 mg total) by mouth daily as needed.   Past Week   losartan (COZAAR) 100 MG tablet Take 1 tablet (100 mg total) by mouth daily. 90 tablet 3 05/05/2022   metoprolol succinate (TOPROL-XL) 50 MG 24 hr tablet Take 1 tablet (50 mg total) by mouth 2 (two) times daily. Take with or immediately following a meal.   05/06/2022   omeprazole (PRILOSEC) 20 MG capsule Take 1 capsule (20 mg total) by mouth daily. 90 capsule 3 05/05/2022   Polyethyl Glycol-Propyl Glycol (SYSTANE) 0.4-0.3 % GEL ophthalmic gel Place 1 application into both eyes.   05/05/2022   potassium chloride (KLOR-CON) 10 MEQ tablet Take 1 tablet (10 mEq total) by mouth daily. 90 tablet 3 05/05/2022   warfarin (COUMADIN) 5  MG tablet TAKE ONE AND ONE-HALF TABLET BY MOUTH DAILY EXCEPT TAKE 1 TABLET ON MONDAYS AND THURSDAYS OR AS DIRECTED BY ANTICOAGULATION CLINIC (Patient taking differently: Take 5-7.5 mg by mouth See admin instructions. Take 1 tablet ('5mg'$ ) by mouth every Tuesday, Wednesday, Thursday, Friday, Saturday and Sunday evening and take 1 tablets (7.'5mg'$ ) by mouth every Monday evening) 45 tablet 0 04/30/2022   nitroGLYCERIN (NITROSTAT) 0.4 MG SL tablet Place 1 tablet (0.4 mg total) under the tongue every 5 (five) minutes as needed for chest pain. 25 tablet 3    Scheduled:   amiodarone  200 mg Oral Daily   aspirin  81 mg Oral Daily   clopidogrel  75 mg Oral Q breakfast   diltiazem  120 mg Oral Daily   furosemide  20 mg Oral Daily   gabapentin  100 mg Oral TID   levothyroxine  100  mcg Oral Q0600   losartan  100 mg Oral Daily   metoprolol succinate  50 mg Oral BID   pantoprazole  40 mg Oral Daily   potassium chloride  10 mEq Oral Daily   sodium chloride flush  3 mL Intravenous Q12H   Warfarin - Pharmacist Dosing Inpatient   Does not apply q1600   Infusions:   sodium chloride     PRN: sodium chloride, acetaminophen, nitroGLYCERIN, ondansetron (ZOFRAN) IV, sodium chloride flush  Assessment: 88 year old female post-catheterization. PMH relevant for atrial fibrillation (CHADSVASC 5 on warfarin PTA), hypertension. Cardiac CTA with significant stenosis in mid to distal LAD and significant proximal stenosis in LCX. Pt warfarin dose was increased to 1.5 tabs (5 mg tablet) on 5/18. Per last anticoag note, the plan was to take 1.5 tabs 5/26 to 5/28 and then no warfarin on 5/29 and 1 tablet on 5/30.   Date INR Warfarin Dose  5/25 unknown 5 mg  5/26 1.2  7.5 mg     DDI  Aspirin (new): increased bleeding risk Clopidogrel (new): increased bleeding risk Amiodarone (PTA medication): increased warfarin concentration   Goal of Therapy:  INR 2-3 Monitor platelets by anticoagulation protocol: Yes   Plan:  INR is subtherapeutic. Will give warfarin 7.5 mg x 1 tonight. Daily INR ordered. CBC at least every 3 days.    Oswald Hillock, PharmD, BCPS 05/07/2022 7:22 AM

## 2022-05-07 NOTE — Plan of Care (Signed)
°  Problem: Coping: °Goal: Level of anxiety will decrease °Outcome: Progressing °  °

## 2022-05-07 NOTE — Discharge Summary (Signed)
Discharge Summary    Patient ID: [name] MRN: [id number]; DOB: October 11, 1932  Admit date: 05/06/2022 Discharge date: 05/07/2022  PCP:  Tonia Ghent, MD   [facility] Providers Cardiologist:  Ida Rogue, MD        Discharge Diagnoses    Principal Problem:   Effort angina [facility]) Active Problems:   Shortness of breath   Unstable angina [facility])   Coronary artery disease involving native coronary artery of native heart with angina pectoris [facility])    Diagnostic Studies/Procedures    Coronary Stent Intervention Left Heart Cath and Coronary Angiography completed on 05/06/2022   2nd Mrg lesion is 99% stenosed.   Mid LAD lesion is 90% stenosed.   Mid LAD to Dist LAD lesion is 20% stenosed.   A drug-eluting stent was successfully placed using a STENT ONYX FRONTIER 3.0X26.   Post intervention, there is a 0% residual stenosis.   1.  Severe two-vessel coronary artery disease with 90% stenosis in the mid LAD and subtotal occlusion of a small OM 2.  Codominant coronary arteries.  The coronary arteries are moderately calcified overall. 2.  Mildly elevated left ventricular end-diastolic pressure.  Left ventricular angiography was not performed.  EF was normal by echo. 3.  Successful angioplasty and drug-eluting stent placement to the mid LAD.  Diagnostic Dominance: Co-dominant  _____________   History of Present Illness     Julie Phillips is a 88 y.o. female with a history of coronary artery disease, chronic persistent atrial fibrillation (2007) with recent cardioversion (03/2022) on long tern anticoagulant with warfarin, congestive heart failure (HFpEF), multiple falls, GERD, essential hypertension, hyperlipidemia, hypothyroidism, keratopathy (2018), and LVH. In the clinic setting she had complaints of chest pain, fatigue, and leg swelling and underwent cardiac CTA which revealed abnormal results. Further testing was needed after discussion was had with the patient.  Hospital  Course     Consultants: None  Julie Phillips presented to the hospital for outpatient left heart catheterization for chest pain, worsening fatigue, shortness of breath, orthopnea, and leg swelling, who had recently had a cardiac CTA. The cardiac CTA revealed significiant stenosis in the mid to distal LAD and significant proximal stenosis of the LCx. These results were gone over in detail and she was scheduled for the left heart catheterization as an outpatient procedure. She was found to have severe two vessel coronary artery disease with 90% stenosis in the mid LAD and subtotal occlusion of a small OM2. The coronary arteries are moderately calcified overall. Successful angioplasty and drug-eluting stent placement to the mid LAD with Onyx Frontier 3.0 x 26 mm DES. The OM2 was too small to stent.  With successful intervention she was observed overnight on the progressive care unit. She had an uneventful night. Vital remained stable. There were no bleeding or hematoma from her right wrist cath site.  On AM rounds she denies any chest pain or shortness of breath. Endorses lack of sleep overall, but feels well. No pain, numbness, or tingling in the right wrist or hand. Explantation of new medications given with questions answered,  right wrist protection education given, cardiac rehab discussed, and patient is eager to return home today.  Patient will be started on Repatha in the outpatient setting.   Discharge Vitals Blood pressure (!) 157/57, pulse (!) 59, temperature 97.7 F (36.5 C), temperature source Oral, resp. rate 16, height '5\' 1"'$  (1.549 m), weight 101.6 kg, SpO2 95 %.  Filed Weights   05/06/22 1223  Weight: 101.6  kg    GEN: Well nourished, well developed, in no acute distress.  HEENT: Grossly normal.  Neck: Supple, no JVD, carotid bruits, or masses. Cardiac: RRR, no murmurs, rubs, or gallops. No clubbing, cyanosis, edema.  Radials/DP/PT 2+ and equal bilaterally. Right radial cath site with  gauze and opsite dressing without bleeding, hematoma, or pain.  Respiratory:  Respirations regular and unlabored, clear to auscultation bilaterally. Respirations are unlabored on room air.  GI: Soft, nontender, nondistended, obese,  BS + x 4. MS: no deformity or atrophy. Skin: warm and dry, no rash. Neuro:  Strength and sensation are intact. Psych: AAOx3.  Normal affect.  Labs & Radiologic Studies    CBC Recent Labs    05/06/22 2124 05/07/22 0606  WBC 8.2 8.6  HGB 6.2* 10.0*  HCT 21.6* 32.4*  MCV 70.8* 88.0  PLT 443* 381   Basic Metabolic Panel Recent Labs    05/07/22 0606  NA 138  K 4.0  CL 107  CO2 25  GLUCOSE 86  BUN 13  CREATININE 0.68  CALCIUM 8.8*   Liver Function Tests No results for input(s): AST, ALT, ALKPHOS, BILITOT, PROT, ALBUMIN in the last 72 hours. No results for input(s): LIPASE, AMYLASE in the last 72 hours. High Sensitivity Troponin:   No results for input(s): TROPONINIHS in the last 720 hours.  BNP Invalid input(s): POCBNP D-Dimer No results for input(s): DDIMER in the last 72 hours. Hemoglobin A1C No results for input(s): HGBA1C in the last 72 hours. Fasting Lipid Panel No results for input(s): CHOL, HDL, LDLCALC, TRIG, CHOLHDL, LDLDIRECT in the last 72 hours. Thyroid Function Tests No results for input(s): TSH, T4TOTAL, T3FREE, THYROIDAB in the last 72 hours.  Invalid input(s): FREET3 _____________  CARDIAC CATHETERIZATION  Addendum Date: 05/06/2022     2nd Mrg lesion is 99% stenosed.   Mid LAD lesion is 90% stenosed.   Mid LAD to Dist LAD lesion is 20% stenosed.   A drug-eluting stent was successfully placed using a STENT ONYX FRONTIER 3.0X26.   Post intervention, there is a 0% residual stenosis. 1.  Severe two-vessel coronary artery disease with 90% stenosis in the mid LAD and subtotal occlusion of a small OM 2.  Codominant coronary arteries.  The coronary arteries are moderately calcified overall. 2.  Mildly elevated left ventricular  end-diastolic pressure.  Left ventricular angiography was not performed.  EF was normal by echo. 3.  Successful angioplasty and drug-eluting stent placement to the mid LAD. Recommendations: Warfarin can be resumed tonight. Treat with aspirin and clopidogrel for now.  Once INR is therapeutic, aspirin can be discontinued to minimize the risk of bleeding. The patient is intolerant to statins.  Consider alternative medications. OM 2 is too small to stent.    Result Date: 05/06/2022   2nd Mrg lesion is 99% stenosed.   Mid LAD lesion is 90% stenosed.   Mid LAD to Dist LAD lesion is 20% stenosed.   A drug-eluting stent was successfully placed using a STENT ONYX FRONTIER 3.0X26.   Post intervention, there is a 0% residual stenosis. 1.  Severe two-vessel coronary artery disease with 90% stenosis in the mid LAD and subtotal occlusion of a small OM 2.  Codominant coronary arteries.  The coronary arteries are moderately calcified overall. 2.  Mildly elevated left ventricular end-diastolic pressure.  Left ventricular angiography was not performed.  EF was normal by echo. 3.  Successful angioplasty and drug-eluting stent placement to the mid LAD. Recommendations: Warfarin can be resumed tonight. Treat  with aspirin and clopidogrel for now.  Once INR is therapeutic, aspirin can be discontinued to minimize the risk of bleeding. The patient is intolerant to statins.  Consider alternative medications.   CT CORONARY MORPH W/CTA COR W/SCORE W/CA W/CM &/OR WO/CM  Addendum Date: 04/23/2022   ADDENDUM REPORT: 04/23/2022 10:10 EXAM: OVER-READ INTERPRETATION  CT CHEST The following report is an over-read performed by radiologist Dr. Barnetta Hammersmith [facility] on 04/23/2022. This over-read does not include interpretation of cardiac or coronary anatomy or pathology. The coronary CTA interpretation by the cardiologist is attached. COMPARISON:  None. FINDINGS: No significant noncardiac vascular findings. Visualized  mediastinum and hilar regions demonstrate no lymphadenopathy or masses. There is a small hiatal hernia. Visualized lungs show no evidence of pulmonary edema, consolidation, pneumothorax, nodule or pleural fluid. Visualized upper abdomen demonstrates evidence of hepatic steatosis. Visualized bony structures are unremarkable. IMPRESSION: 1. Small hiatal hernia. 2. Evidence of hepatic steatosis. Electronically Signed   By: Aletta Edouard M.D.   On: 04/23/2022 10:10   Result Date: 04/23/2022 EXAM: Cardiac/Coronary  CTA TECHNIQUE: The patient was scanned on a Siemens Somatom go.Top scanner. : A retrospective scan was triggered in the descending thoracic aorta. Axial non-contrast 3 mm slices were carried out through the heart. The data set was analyzed on a dedicated work station and scored using the [place]. Gantry rotation speed was 330 msecs and collimation was .6 mm. '100mg'$  of metoprolol and 0.8 mg of sl NTG was given. The 3D data set was reconstructed in 5% intervals of the 60-95 % of the R-R cycle. Diastolic phases were analyzed on a dedicated work station using MPR, MIP and VRT modes. The patient received 75 cc of contrast. FINDINGS: Aorta: Normal size. Aortic root and descending aorta calcifications. No dissection. Aortic Valve:  Trileaflet.  No calcifications. Coronary Arteries:  Normal coronary origin.  Right dominance. RCA is a dominant artery that gives rise to PDA and PLA. There is calcified plaque in the proximal segment causing mild stenosis (25-49%). Left main is a large artery that gives rise to LAD and LCX arteries. There is no LM disease. LAD has calcified plaque in the proximal segment causing severe stenosis (>70%) LCX is a non-dominant artery that gives rise to one two obtuse marginal branches. There is calcified plaque in the proximal LCx segment causing moderate stenosis (50%). Other findings: Normal pulmonary vein drainage into the left atrium. Normal left atrial appendage without a  thrombus. Normal size of the pulmonary artery. IMPRESSION: 1. Coronary calcium score of 902. This was 87th percentile for age and sex matched control. 2. Normal coronary origin with right dominance. 3. Calcified plaque causing severe proximal LAD stenosis (>70%). 4. Calcified plaque causing moderate proximal LCx stenosis (50%). 5. CAD-RADS 4 Severe stenosis. (70-99% or > 50% left main). Cardiac catheterization is recommended. 6. Additional analysis with CT FFR will be submitted and reported separately. Electronically Signed: By: Kate Sable M.D. On: 04/22/2022 15:29   CT CORONARY FRACTIONAL FLOW RESERVE DATA PREP  Result Date: 04/22/2022 EXAM: CT FFR ANALYSIS CLINICAL DATA:  Abnormal CCTA FINDINGS: FFRct analysis was performed on the original cardiac CT angiogram dataset. Diagrammatic representation of the FFRct analysis is provided in a separate PDF document in PACS. This dictation was created using the PDF document and an interactive 3D model of the results. 3D model is not available in the EMR/PACS. Normal FFR range is >0.80. 1. Left Main:  No significant stenosis. 2. LAD: significant stenosis in the mid to  distal LAD.  FFRct 0.70 3. LCX: significant proximal stenosis. FFR ct showed CTO of proximal LCx 4. RCA: No significant stenosis.  FFRct 0.82 IMPRESSION: 1. CT FFR analysis showed significant stenosis in the mid LAD and proximal LCx. 2.  Recommend cardiac catheterization. Electronically Signed   By: Kate Sable M.D.   On: 04/22/2022 17:44    Disposition   Pt is being discharged home today in good condition. She has been started on new medications that have been explained to her with questions answered. She was advised that her ASA can be stopped once her INR becomes therapeutic to minimize the risk of bleeding. She has been seen by Dr. Rockey Situ. Vital signs are stable and she has been pain free since her procedure.   Follow-up Plans & Appointments     Follow-up Information      [place] [facility] Follow up on 05/13/2022.   Specialty: Family Medicine Why: appointment time 10:00 AM Contact information: [place]        Minna Merritts, MD Follow up on 06/18/2022.   Specialty: Cardiology Why: appointment time is 2:40 pm Contact information: [place] [address] [phone]                   Discharge Instructions     AMB Referral to Cardiac Rehabilitation - Phase II   Complete by: As directed    Diagnosis:  Stable Angina Coronary Stents     After initial evaluation and assessments completed: Virtual Based Care may be provided alone or in conjunction with Phase 2 Cardiac Rehab based on patient barriers.: Yes       Discharge Medications   Allergies as of 05/07/2022       Reactions   Statins    Myalgias   Wheat    Throat swelling   Amlodipine    hematuria   Eucalyptus Oil    Lisinopril    Unrecalled BP med- possibly lisinopril- intolerant, had cough on the medicine- changed to cozaar   Rice Nausea And Vomiting        Medication List     TAKE these medications    amiodarone 200 MG tablet Commonly known as: PACERONE Take 1 tablet (200 mg total) by mouth daily.   aspirin 81 MG chewable tablet Chew 1 tablet (81 mg total) by mouth daily.   clopidogrel 75 MG tablet Commonly known as: PLAVIX Take 1 tablet (75 mg total) by mouth daily with breakfast.   diltiazem 120 MG 24 hr capsule Commonly known as: CARDIZEM CD Take 1 capsule (120 mg total) by mouth daily.   diphenhydrAMINE 25 mg capsule Commonly known as: Benadryl Take 1 capsule (25 mg total) by mouth daily as needed.   furosemide 20 MG tablet Commonly known as: Lasix Take 1 tablet (20 mg total) by mouth daily.   gabapentin 100 MG capsule Commonly known as: NEURONTIN TAKE 1 CAPSULE BY MOUTH 3 TIMES DAILY ASNEEDED   levothyroxine 100 MCG tablet Commonly known  as: SYNTHROID TAKE 1 TABLET BY MOUTH ONCE A DAY BEFOREBREAKFAST.   loratadine 10 MG tablet Commonly known as: CLARITIN Take 1 tablet (10 mg total) by mouth daily as needed.   losartan 100 MG tablet Commonly known as: COZAAR Take 1 tablet (100 mg total) by mouth daily.   metoprolol succinate 50 MG 24 hr tablet Commonly known as: TOPROL-XL Take 1 tablet (50 mg total) by  mouth 2 (two) times daily. Take with or immediately following a meal.   nitroGLYCERIN 0.4 MG SL tablet Commonly known as: NITROSTAT Place 1 tablet (0.4 mg total) under the tongue every 5 (five) minutes as needed for chest pain.   omeprazole 20 MG capsule Commonly known as: PRILOSEC Take 1 capsule (20 mg total) by mouth daily.   Polyethyl Glycol-Propyl Glycol 0.4-0.3 % Gel ophthalmic gel Commonly known as: SYSTANE Place 1 application into both eyes.   potassium chloride 10 MEQ tablet Commonly known as: KLOR-CON Take 1 tablet (10 mEq total) by mouth daily.   warfarin 5 MG tablet Commonly known as: COUMADIN Take as directed. If you are unsure how to take this medication, talk to your nurse or doctor. Original instructions: TAKE ONE AND ONE-HALF TABLET BY MOUTH DAILY EXCEPT TAKE 1 TABLET ON MONDAYS AND THURSDAYS OR AS DIRECTED BY ANTICOAGULATION CLINIC What changed:  how much to take how to take this when to take this additional instructions           Outstanding Labs/Studies   Will continue to have her PT/INR monitored by [facility], Nurse is Larene Beach per the patient, will need to remind patient to stop ASA therapy once INR therapeutic.  Duration of Discharge Encounter   Greater than 30 minutes including physician time.  Signed, Natane Heward, NP 05/07/2022, 9:15 AM      Did the patient have an acute coronary syndrome (MI, NSTEMI, STEMI, etc) this admission?:  No                               Did the patient have a percutaneous coronary intervention (stent / angioplasty)?:  Yes.      Cath/PCI Registry Performance & Quality Measures: Aspirin prescribed? - Yes ADP Receptor Inhibitor (Plavix/Clopidogrel, Brilinta/Ticagrelor or Effient/Prasugrel) prescribed (includes medically managed patients)? - Yes High Intensity Statin (Lipitor 40-'80mg'$  or Crestor 20-'40mg'$ ) prescribed? - No - patient has recorded history of statin intolerance and will be started on alternative medication,  injectables such as repatha For EF <40%, was ACEI/ARB prescribed? - Not Applicable (EF >/= 84%) For EF <40%, Aldosterone Antagonist (Spironolactone or Eplerenone) prescribed? - Not Applicable (EF >/= 16%) Cardiac Rehab Phase II ordered? - Yes

## 2022-05-13 ENCOUNTER — Ambulatory Visit (INDEPENDENT_AMBULATORY_CARE_PROVIDER_SITE_OTHER): Payer: PPO

## 2022-05-13 DIAGNOSIS — Z7901 Long term (current) use of anticoagulants: Secondary | ICD-10-CM | POA: Diagnosis not present

## 2022-05-13 LAB — POCT INR: INR: 1.6 — AB (ref 2.0–3.0)

## 2022-05-13 NOTE — Progress Notes (Signed)
Pt had stent placement on 5/25 and was on warfarin hold for 5 days but did not follow dosing instructions after the procedure of increasing doses for 3 days. Pt took 1 tablet daily since 5/26. Cardiology also instructed in note that pt is to remain on 81 mg aspirin until INR is therapeutic. Pt was instructed to stay on aspirin until recheck of INR in 2 weeks. Pt verbalized understanding.   Increase dose today to take 1 1/2 tablets and increase dose tomorrow to take 1 1/2 tablets and then continue 1 tablet daily except take 1/2 tablet on Mondays and Thursdays. Recheck in 2 weeks.

## 2022-05-13 NOTE — Patient Instructions (Addendum)
Pre visit review using our clinic review tool, if applicable. No additional management support is needed unless otherwise documented below in the visit note.  Increase dose today to take 1 1/2 tablets and increase dose tomorrow to take 1 1/2 tablets and then continue 1 tablet daily except take 1/2 tablet on Mondays and Thursdays. Recheck in 2 weeks.

## 2022-05-15 NOTE — Progress Notes (Signed)
Patient ID: Julie Phillips, female    DOB: 05/04/1932, 88 y.o.   MRN: [id number]  HPI  Julie Phillips is a 88 y/o female with a history of atrial fibrillation, hyperlipidemia, HTN, thyroid disease, GERD, keratopathy (right eye) and chronic heart failure.   Echo report from 02/10/22 reviewed and showed an EF of 65-70% along with mild LVH/LAE and mild MR.   LHC done 05/06/22 and showed:  2nd Mrg lesion is 99% stenosed.   Mid LAD lesion is 90% stenosed.   Mid LAD to Dist LAD lesion is 20% stenosed.   A drug-eluting stent was successfully placed using a STENT ONYX FRONTIER [id number].   Post intervention, there is a 0% residual stenosis.  1.  Severe two-vessel coronary artery disease with 90% stenosis in the mid LAD and subtotal occlusion of a small OM 2.  Codominant coronary arteries.  The coronary arteries are moderately calcified overall. 2.  Mildly elevated left ventricular end-diastolic pressure.  Left ventricular angiography was not performed.  EF was normal by echo. 3.  Successful angioplasty and drug-eluting stent placement to the mid LAD.  Admitted 04/01/22 due to increased fatigue, exertional shortness of breath, orthopnea and lower extremity swelling. Cardiology consult obtained. Treated w/ lasix, metoprolol, losartan. Successful cardioversion done. Weaned off amiodarone drip and transitioned to oral amiodarone. Discharged the next day.   She presents today for a follow-up visit with a chief complaint of minimal fatigue upon moderate exertion. Describes this as chronic in nature although continues to improve. She has associated shortness of breath and easy bruising along with this. She denies any difficulty sleeping, abdominal distention, palpitations, pedal edema, chest pain, cough, dizziness or weight gain.   Says that she feels "much better" since having the stent put in.   Past Medical History:  Diagnosis Date   Arrhythmia    atrial fibrillation   CHF (congestive heart failure) (HCC)     Chronic heart failure with preserved ejection fraction (HFpEF) ([place])    a. 01/2022 Echo: EF 65-70%, no rwma, mild LVH, GrII DD, nl RV fxn, mildly dil LA, mild MR, ao scelrosis.   Fall    GERD (gastroesophageal reflux disease)    History of chicken pox    Hx of colonic polyps    Hyperlipidemia    Hypertension    Hypothyroidism    Keratopathy 2018   R eye   LVH (left ventricular hypertrophy)    Persistent atrial fibrillation ([place])    a. Initially dx in 2012; b. 01/2022 recurrent AF rvr noted-->warfarin added (CHA2DS2VASc = 5); c. 03/2022 Amio added-->DCCV x 1 (150J).   Past Surgical History:  Procedure Laterality Date   CARDIOVASCULAR STRESS TEST  2011   normal    CARDIOVERSION N/A 04/02/2022   Procedure: CARDIOVERSION;  Surgeon: Minna Merritts, MD;  Location: [facility];  Service: Cardiovascular;  Laterality: N/A;   CORONARY STENT INTERVENTION N/A 05/06/2022   Procedure: CORONARY STENT INTERVENTION;  Surgeon: Wellington Hampshire, MD;  Location: [facility];  Service: Cardiovascular;  Laterality: N/A;   keratopathy     LEFT HEART CATH AND CORONARY ANGIOGRAPHY N/A 05/06/2022   Procedure: LEFT HEART CATH AND CORONARY ANGIOGRAPHY;  Surgeon: Wellington Hampshire, MD;  Location: [facility];  Service: Cardiovascular;  Laterality: N/A;   Family History  Problem Relation Age of Onset   Heart disease Mother    Heart disease Father    Breast cancer Sister    Breast cancer Daughter  Colon cancer Neg Hx    Social History   Tobacco Use   Smoking status: Former    Packs/day: 2.00    Years: 20.00    Pack years: 40.00    Types: Cigarettes    Quit date: 12/14/1971    Years since quitting: 50.4   Smokeless tobacco: Never  Substance Use Topics   Alcohol use: Not Currently   Allergies  Allergen Reactions   Statins     Myalgias   Wheat     Throat swelling   Amlodipine     hematuria   Eucalyptus Oil    Lisinopril     Unrecalled BP med- possibly lisinopril- intolerant, had  cough on the medicine- changed to cozaar   Rice Nausea And Vomiting   Prior to Admission medications   Medication Sig Start Date End Date Taking? Authorizing Provider  amiodarone (PACERONE) 200 MG tablet Take 1 tablet (200 mg total) by mouth daily. 04/09/22  Yes Theora Gianotti, NP  aspirin 81 MG chewable tablet Chew 1 tablet (81 mg total) by mouth daily. 05/07/22  Yes Hammock, Barbera Setters, NP  clopidogrel (PLAVIX) 75 MG tablet Take 1 tablet (75 mg total) by mouth daily with breakfast. 05/07/22  Yes Hammock, Sheri, NP  diltiazem (CARDIZEM CD) 120 MG 24 hr capsule Take 1 capsule (120 mg total) by mouth daily. 03/31/22  Yes Minna Merritts, MD  diphenhydrAMINE (BENADRYL) 25 mg capsule Take 1 capsule (25 mg total) by mouth daily as needed. 01/14/17  Yes Tonia Ghent, MD  furosemide (LASIX) 20 MG tablet Take 1 tablet (20 mg total) by mouth daily. 04/26/22  Yes Gollan, Kathlene November, MD  gabapentin (NEURONTIN) 100 MG capsule TAKE 1 CAPSULE BY MOUTH 3 TIMES DAILY ASNEEDED 01/29/21  Yes Tonia Ghent, MD  levothyroxine (SYNTHROID) 100 MCG tablet TAKE 1 TABLET BY MOUTH ONCE A DAY BEFOREBREAKFAST. 07/12/21  Yes Tonia Ghent, MD  loratadine (CLARITIN) 10 MG tablet Take 1 tablet (10 mg total) by mouth daily as needed. 01/14/17  Yes Tonia Ghent, MD  losartan (COZAAR) 100 MG tablet Take 1 tablet (100 mg total) by mouth daily. 03/01/22  Yes Minna Merritts, MD  metoprolol succinate (TOPROL-XL) 50 MG 24 hr tablet Take 1 tablet (50 mg total) by mouth 2 (two) times daily. Take with or immediately following a meal. Patient taking differently: Take 50 mg by mouth 2 (two) times daily. Take with or immediately following a meal. (100 mg daily) 04/02/22  Yes Wyvonnia Dusky, MD  nitroGLYCERIN (NITROSTAT) 0.4 MG SL tablet Place 1 tablet (0.4 mg total) under the tongue every 5 (five) minutes as needed for chest pain. 01/01/22  Yes Gollan, Kathlene November, MD  omeprazole (PRILOSEC) 20 MG capsule Take 1 capsule (20 mg  total) by mouth daily. 07/10/21  Yes Tonia Ghent, MD  Polyethyl Glycol-Propyl Glycol (SYSTANE) 0.4-0.3 % GEL ophthalmic gel Place 1 application into both eyes.   Yes [provider]  potassium chloride (KLOR-CON) 10 MEQ tablet Take 1 tablet (10 mEq total) by mouth daily. 07/12/21  Yes Tonia Ghent, MD  warfarin (COUMADIN) 5 MG tablet TAKE ONE AND ONE-HALF TABLET BY MOUTH DAILY EXCEPT TAKE 1 TABLET ON MONDAYS AND THURSDAYS OR AS DIRECTED BY ANTICOAGULATION CLINIC Patient taking differently: Take 5-7.5 mg by mouth See admin instructions. Take 1 tablet ('5mg'$ ) by mouth every Tuesday, Wednesday, Thursday, Friday, Saturday and Sunday evening and take 1 tablets (7.'5mg'$ ) by mouth every Monday evening 02/18/22  Yes Damita Dunnings,  Elveria Rising, MD    Review of Systems  Constitutional:  Positive for fatigue (very little; improving). Negative for appetite change.  HENT:  Positive for rhinorrhea. Negative for congestion, postnasal drip and sore throat.   Eyes: Negative.   Respiratory:  Positive for shortness of breath (with moderat exertion). Negative for cough and chest tightness.   Cardiovascular:  Negative for chest pain, palpitations and leg swelling.  Gastrointestinal:  Negative for abdominal distention and abdominal pain.  Endocrine: Negative.   Genitourinary: Negative.   Musculoskeletal:  Negative for back pain and neck pain.  Skin: Negative.   Allergic/Immunologic: Negative.   Neurological:  Negative for dizziness and light-headedness.  Hematological:  Negative for adenopathy. Bruises/bleeds easily.  Psychiatric/Behavioral:  Negative for dysphoric mood and sleep disturbance (sleeping on 1 pillow). The patient is not nervous/anxious.    Vitals:   05/17/22 1102  BP: 131/60  Pulse: (!) 56  Resp: 16  SpO2: 97%  Weight: 225 lb 8 oz (102.3 kg)  Height: '5\' 1"'$  (1.549 m)   Wt Readings from Last 3 Encounters:  05/17/22 225 lb 8 oz (102.3 kg)  05/06/22 224 lb (101.6 kg)  04/26/22 225 lb 4 oz  (102.2 kg)   Lab Results  Component Value Date   CREATININE 0.68 05/07/2022   CREATININE 0.91 04/27/2022   CREATININE 0.87 04/02/2022   Physical Exam Vitals and nursing note reviewed.  Constitutional:      Appearance: Normal appearance.  HENT:     Head: Normocephalic and atraumatic.  Cardiovascular:     Rate and Rhythm: Regular rhythm. Bradycardia present.  Pulmonary:     Effort: Pulmonary effort is normal. No respiratory distress.     Breath sounds: No wheezing or rales.  Abdominal:     General: There is no distension.     Palpations: Abdomen is soft.     Tenderness: There is no abdominal tenderness.  Musculoskeletal:        General: No tenderness.     Cervical back: Normal range of motion and neck supple.     Right lower leg: No edema.     Left lower leg: No edema.  Skin:    General: Skin is warm and dry.  Neurological:     General: No focal deficit present.     Mental Status: She is alert and oriented to person, place, and time.  Psychiatric:        Mood and Affect: Mood normal.        Behavior: Behavior normal.        Thought Content: Thought content normal.   Assessment & Plan:  1: Chronic heart failure with preserved ejection fraction with structural changes (LVH/LAE)- - NYHA  II - euvolemic today - weighing daily and understands to call for an overnight weight gain of > 2 pounds or a weekly weight gain of > 5 pounds - weight down 3 pounds from last visit here 1 month ago - not adding salt to her food and trying to follow a low sodium diet - participating in [facility] HF monitoring program  - started spironolactone and jardiance and had issues and didn't know which one was causing the problem so stopped them both and she started feeling better; is not interested in trying either again - BNP 04/27/22 was 295.5 - PharmD reconciled medications   2: HTN- - BP mildly elevated (131/60) - saw PCP Damita Dunnings) 02/12/22 - BMP 05/07/22 reviewed and showed sodium 138,  potassium 4.0, creatinine 0.68 and GFR >60  3: Atrial fibrillation- - cardioverted 04/02/22 - saw cardiology Rockey Situ) 04/26/22 - on amiodarone daily now, metoprolol succinate, diltiazem and warfarin  4: CAD- - Successful angioplasty and drug-eluting stent placement to the mid LAD done 05/06/22   Medication list reviewed.   Due to HF stability, will not make a return appointment for patient at this time. Advised patient to follow closely with cardiology and her PCP but that she could call back at anytime for any questions or to schedule another appointment. She was comfortable with this plan.

## 2022-05-17 ENCOUNTER — Encounter: Payer: Self-pay | Admitting: Family

## 2022-05-17 ENCOUNTER — Ambulatory Visit: Payer: PPO | Attending: Family | Admitting: Family

## 2022-05-17 VITALS — BP 131/60 | HR 56 | Resp 16 | Ht 61.0 in | Wt 225.5 lb

## 2022-05-17 DIAGNOSIS — Z79899 Other long term (current) drug therapy: Secondary | ICD-10-CM | POA: Diagnosis not present

## 2022-05-17 DIAGNOSIS — Z955 Presence of coronary angioplasty implant and graft: Secondary | ICD-10-CM | POA: Diagnosis not present

## 2022-05-17 DIAGNOSIS — I5032 Chronic diastolic (congestive) heart failure: Secondary | ICD-10-CM | POA: Diagnosis not present

## 2022-05-17 DIAGNOSIS — I2510 Atherosclerotic heart disease of native coronary artery without angina pectoris: Secondary | ICD-10-CM | POA: Diagnosis not present

## 2022-05-17 DIAGNOSIS — K219 Gastro-esophageal reflux disease without esophagitis: Secondary | ICD-10-CM | POA: Diagnosis not present

## 2022-05-17 DIAGNOSIS — Z8249 Family history of ischemic heart disease and other diseases of the circulatory system: Secondary | ICD-10-CM | POA: Insufficient documentation

## 2022-05-17 DIAGNOSIS — E785 Hyperlipidemia, unspecified: Secondary | ICD-10-CM | POA: Insufficient documentation

## 2022-05-17 DIAGNOSIS — E039 Hypothyroidism, unspecified: Secondary | ICD-10-CM | POA: Diagnosis not present

## 2022-05-17 DIAGNOSIS — Z87891 Personal history of nicotine dependence: Secondary | ICD-10-CM | POA: Diagnosis not present

## 2022-05-17 DIAGNOSIS — I25118 Atherosclerotic heart disease of native coronary artery with other forms of angina pectoris: Secondary | ICD-10-CM

## 2022-05-17 DIAGNOSIS — I480 Paroxysmal atrial fibrillation: Secondary | ICD-10-CM

## 2022-05-17 DIAGNOSIS — I10 Essential (primary) hypertension: Secondary | ICD-10-CM | POA: Diagnosis not present

## 2022-05-17 DIAGNOSIS — I4819 Other persistent atrial fibrillation: Secondary | ICD-10-CM | POA: Diagnosis not present

## 2022-05-17 DIAGNOSIS — I110 Hypertensive heart disease with heart failure: Secondary | ICD-10-CM | POA: Diagnosis not present

## 2022-05-17 NOTE — Progress Notes (Signed)
[facility] - PHARMACIST COUNSELING NOTE  Guideline-Directed Medical Therapy/Evidence Based Medicine  ACE/ARB/ARNI: Losartan 100 mg daily Beta Blocker:  metoprolol succinate 100 mg twice daily Aldosterone Antagonist: none (stopped taking) Diuretic: Furosemide 20 mg daily SGLT2i: None  Adherence Assessment  Do you ever forget to take your medication? '[]'$ Yes '[]'$ No  Do you ever skip doses due to side effects? '[]'$ Yes '[]'$ No  Do you have trouble affording your medicines? '[]'$ Yes '[]'$ No  Are you ever unable to pick up your medication due to transportation difficulties? '[]'$ Yes '[]'$ No  Do you ever stop taking your medications because you don't believe they are helping? '[]'$ Yes '[]'$ No  Do you check your weight daily? '[]'$ Yes '[]'$ No   Adherence strategy: pill box  Barriers to obtaining medications: none  Vital signs: HR 56, BP 131/60, weight (pounds) 225 lb ECHO: Date 3/23, EF 65-70%, notes grade II diastolic dysfunction      Latest Ref Rng & Units 05/07/2022    6:06 AM 04/27/2022   10:21 AM 04/02/2022    3:57 AM  BMP  Glucose 70 - 99 mg/dL 86   104   117    BUN 8 - 23 mg/dL '13   19   19    '$ Creatinine 0.44 - 1.00 mg/dL 0.68   0.91   0.87    Sodium 135 - 145 mmol/L 138   138   136    Potassium 3.5 - 5.1 mmol/L 4.0   4.3   3.9    Chloride 98 - 111 mmol/L 107   104   103    CO2 22 - 32 mmol/L '25   23   25    '$ Calcium 8.9 - 10.3 mg/dL 8.8   9.6   8.9      Past Medical History:  Diagnosis Date   Arrhythmia    atrial fibrillation   CHF (congestive heart failure) (HCC)    Chronic heart failure with preserved ejection fraction (HFpEF) ([place])    a. 01/2022 Echo: EF 65-70%, no rwma, mild LVH, GrII DD, nl RV fxn, mildly dil LA, mild MR, ao scelrosis.   Fall    GERD (gastroesophageal reflux disease)    History of chicken pox    Hx of colonic polyps    Hyperlipidemia    Hypertension    Hypothyroidism    Keratopathy 2018   R eye   LVH (left ventricular  hypertrophy)    Persistent atrial fibrillation ([place])    a. Initially dx in 2012; b. 01/2022 recurrent AF rvr noted-->warfarin added (CHA2DS2VASc = 5); c. 03/2022 Amio added-->DCCV x 1 (150J).    ASSESSMENT 88 year old female who presents to the HF clinic for follow up. PMH relevant to HFpEF, CAD, persistent Afib on warfarin, GERD, HTN, HLD, hypothyroidism, keratopathy. Started Jardiance 2 weeks ago, but then self-discontinued due to concerns of muscle pain, foggy memory and shortness of breath. Reports weighing herself daily.  Recent ED Visit (past 6 months): Date - 05/06/22, [facility] - atrial fibrillation  PLAN Reconciled medications. Suggest BMP in 1 month.  Time spent: 15 minutes  [name], PharmD Pharmacy Resident  05/17/2022 2:31 PM   Current Outpatient Medications:    amiodarone (PACERONE) 200 MG tablet, Take 1 tablet (200 mg total) by mouth daily., Disp: 90 tablet, Rfl: 3   aspirin 81 MG chewable tablet, Chew 1 tablet (81 mg total) by mouth daily., Disp: 30 tablet, Rfl: 1   clopidogrel (PLAVIX) 75 MG tablet, Take 1  tablet (75 mg total) by mouth daily with breakfast., Disp: 30 tablet, Rfl: 6   diltiazem (CARDIZEM CD) 120 MG 24 hr capsule, Take 1 capsule (120 mg total) by mouth daily., Disp: 90 capsule, Rfl: 3   diphenhydrAMINE (BENADRYL) 25 mg capsule, Take 1 capsule (25 mg total) by mouth daily as needed., Disp: , Rfl:    furosemide (LASIX) 20 MG tablet, Take 1 tablet (20 mg total) by mouth daily., Disp: 90 tablet, Rfl: 3   gabapentin (NEURONTIN) 100 MG capsule, TAKE 1 CAPSULE BY MOUTH 3 TIMES DAILY ASNEEDED, Disp: 270 capsule, Rfl: 3   levothyroxine (SYNTHROID) 100 MCG tablet, TAKE 1 TABLET BY MOUTH ONCE A DAY BEFOREBREAKFAST., Disp: 90 tablet, Rfl: 3   loratadine (CLARITIN) 10 MG tablet, Take 1 tablet (10 mg total) by mouth daily as needed., Disp: , Rfl:    losartan (COZAAR) 100 MG tablet, Take 1 tablet (100 mg total) by mouth daily., Disp: 90 tablet, Rfl: 3   metoprolol  succinate (TOPROL-XL) 50 MG 24 hr tablet, Take 1 tablet (50 mg total) by mouth 2 (two) times daily. Take with or immediately following a meal., Disp: , Rfl:    nitroGLYCERIN (NITROSTAT) 0.4 MG SL tablet, Place 1 tablet (0.4 mg total) under the tongue every 5 (five) minutes as needed for chest pain., Disp: 25 tablet, Rfl: 3   omeprazole (PRILOSEC) 20 MG capsule, Take 1 capsule (20 mg total) by mouth daily., Disp: 90 capsule, Rfl: 3   Polyethyl Glycol-Propyl Glycol (SYSTANE) 0.4-0.3 % GEL ophthalmic gel, Place 1 application into both eyes., Disp: , Rfl:    potassium chloride (KLOR-CON) 10 MEQ tablet, Take 1 tablet (10 mEq total) by mouth daily., Disp: 90 tablet, Rfl: 3   warfarin (COUMADIN) 5 MG tablet, TAKE ONE AND ONE-HALF TABLET BY MOUTH DAILY EXCEPT TAKE 1 TABLET ON MONDAYS AND THURSDAYS OR AS DIRECTED BY ANTICOAGULATION CLINIC (Patient taking differently: Take 5-7.5 mg by mouth See admin instructions. Take 1 tablet ('5mg'$ ) by mouth every Tuesday, Wednesday, Thursday, Friday, Saturday and Sunday evening and take 1 tablets (7.'5mg'$ ) by mouth every Monday evening), Disp: 45 tablet, Rfl: 0   COUNSELING POINTS/CLINICAL PEARLS   DRUGS TO CAUTION IN HEART FAILURE  Drug or Class Mechanism  Analgesics NSAIDs COX-2 inhibitors Glucocorticoids  Sodium and water retention, increased systemic vascular resistance, decreased response to diuretics   Diabetes Medications Metformin Thiazolidinediones Rosiglitazone (Avandia) Pioglitazone (Actos) DPP4 Inhibitors Saxagliptin (Onglyza) Sitagliptin (Januvia)   Lactic acidosis Possible calcium channel blockade   Unknown  Antiarrhythmics Class I  Flecainide Disopyramide Class III Sotalol Other Dronedarone  Negative inotrope, proarrhythmic   Proarrhythmic, beta blockade  Negative inotrope  Antihypertensives Alpha Blockers Doxazosin Calcium Channel Blockers Diltiazem Verapamil Nifedipine Central Alpha Adrenergics Moxonidine Peripheral  Vasodilators Minoxidil  Increases renin and aldosterone  Negative inotrope    Possible sympathetic withdrawal  Unknown  Anti-infective Itraconazole Amphotericin B  Negative inotrope Unknown  Hematologic Anagrelide Cilostazol   Possible inhibition of PD IV Inhibition of PD III causing arrhythmias  Neurologic/Psychiatric Stimulants Anti-Seizure Drugs Carbamazepine Pregabalin Antidepressants Tricyclics Citalopram Parkinsons Bromocriptine Pergolide Pramipexole Antipsychotics Clozapine Antimigraine Ergotamine Methysergide Appetite suppressants Bipolar Lithium  Peripheral alpha and beta agonist activity  Negative inotrope and chronotrope Calcium channel blockade  Negative inotrope, proarrhythmic Dose-dependent QT prolongation  Excessive serotonin activity/valvular damage Excessive serotonin activity/valvular damage Unknown  IgE mediated hypersensitivy, calcium channel blockade  Excessive serotonin activity/valvular damage Excessive serotonin activity/valvular damage Valvular damage  Direct myofibrillar degeneration, adrenergic stimulation  Antimalarials Chloroquine Hydroxychloroquine Intracellular inhibition of  lysosomal enzymes  Urologic Agents Alpha Blockers Doxazosin Prazosin Tamsulosin Terazosin  Increased renin and aldosterone  Adapted from Page Carleene Overlie, et al. "Drugs That May Cause or Exacerbate Heart Failure: A Scientific Statement from the American Heart  Association." Circulation 2016; 438:O87-N79. DOI: 10.1161/CIR.0000000000000426   MEDICATION ADHERENCES TIPS AND STRATEGIES Taking medication as prescribed improves patient outcomes in heart failure (reduces hospitalizations, improves symptoms, increases survival) Side effects of medications can be managed by decreasing doses, switching agents, stopping drugs, or adding additional therapy. Please let someone in the [facility] know if you have having bothersome side effects so we can  modify your regimen. Do not alter your medication regimen without talking to [place].  Medication reminders can help patients remember to take drugs on time. If you are missing or forgetting doses you can try linking behaviors, using pill boxes, or an electronic reminder like an alarm on your phone or an app. Some people can also get automated phone calls as medication reminders.

## 2022-05-17 NOTE — Patient Instructions (Addendum)
Continue weighing daily and call for an overnight weight gain of 3 pounds or more or a weekly weight gain of more than 5 pounds. ? ? ?If you have voicemail, please make sure your mailbox is cleaned out so that we may leave a message and please make sure to listen to any voicemails.  ? ? ?Call us in the future if you need us for anything ?

## 2022-05-21 ENCOUNTER — Other Ambulatory Visit: Payer: Self-pay | Admitting: Family Medicine

## 2022-05-21 DIAGNOSIS — Z7901 Long term (current) use of anticoagulants: Secondary | ICD-10-CM

## 2022-05-21 NOTE — Telephone Encounter (Signed)
Pt is compliant with warfarin management and PCP apts. ?Sent in refill.  ?

## 2022-05-25 DIAGNOSIS — I5033 Acute on chronic diastolic (congestive) heart failure: Secondary | ICD-10-CM | POA: Diagnosis not present

## 2022-05-27 ENCOUNTER — Ambulatory Visit (INDEPENDENT_AMBULATORY_CARE_PROVIDER_SITE_OTHER): Payer: PPO

## 2022-05-27 DIAGNOSIS — Z7901 Long term (current) use of anticoagulants: Secondary | ICD-10-CM

## 2022-05-27 LAB — POCT INR: INR: 1.8 — AB (ref 2.0–3.0)

## 2022-05-27 NOTE — Progress Notes (Signed)
Increase dose today to take 1 tablet and then change weekly dose to take 1 tablet daily except take 1/2 tablet on Mondays. Recheck in 3 weeks.

## 2022-05-27 NOTE — Patient Instructions (Addendum)
Pre visit review using our clinic review tool, if applicable. No additional management support is needed unless otherwise documented below in the visit note.  Increase dose today to take 1 tablet and then change weekly dose to take 1 tablet daily except take 1/2 tablet on Mondays. Recheck in 3 weeks.

## 2022-05-31 ENCOUNTER — Other Ambulatory Visit: Payer: Self-pay | Admitting: Cardiovascular Disease

## 2022-05-31 NOTE — Telephone Encounter (Signed)
*  STAT* If patient is at the pharmacy, call can be transferred to refill team.   1. Which medications need to be refilled? (please list name of each medication and dose if known) metoprolol succinate (TOPROL-XL) 50 MG 24 hr tablet  2. Which pharmacy/location (including street and city if local pharmacy) is medication to be sent to?[place], [place]  3. Do they need a 30 day or 90 day supply? 90  Pharmacy called to clarify dosage.

## 2022-06-01 MED ORDER — METOPROLOL SUCCINATE ER 50 MG PO TB24: 50.0000 mg | ORAL_TABLET | Freq: Two times a day (BID) | ORAL | 0 refills | Status: DC

## 2022-06-01 NOTE — Telephone Encounter (Signed)
Requested Prescriptions   Signed Prescriptions Disp Refills   metoprolol succinate (TOPROL-XL) 50 MG 24 hr tablet 180 tablet 0    Sig: Take 1 tablet (50 mg total) by mouth 2 (two) times daily. Take with or immediately following a meal.    Authorizing Provider: Minna Merritts    Ordering User: Raelene Bott, Dirk Vanaman L

## 2022-06-14 ENCOUNTER — Telehealth: Payer: Self-pay | Admitting: Family

## 2022-06-14 ENCOUNTER — Other Ambulatory Visit: Payer: Self-pay | Admitting: Family

## 2022-06-14 MED ORDER — SPIRONOLACTONE 25 MG PO TABS: 25.0000 mg | ORAL_TABLET | Freq: Every day | ORAL | 3 refills | Status: DC

## 2022-06-14 NOTE — Progress Notes (Signed)
Updated med list to add spironolactone and d/c potassium per [facility] provider

## 2022-06-14 NOTE — Telephone Encounter (Signed)
CMP results obtained from [facility] program dated 05/25/22:  Creatinine 0.8 BUN 12 eGFR 71 Sodium 137 Potassium 4.1

## 2022-06-17 ENCOUNTER — Ambulatory Visit (INDEPENDENT_AMBULATORY_CARE_PROVIDER_SITE_OTHER): Payer: PPO

## 2022-06-17 DIAGNOSIS — Z7901 Long term (current) use of anticoagulants: Secondary | ICD-10-CM | POA: Diagnosis not present

## 2022-06-17 LAB — POCT INR: INR: 1.7 — AB (ref 2.0–3.0)

## 2022-06-17 NOTE — Progress Notes (Signed)
Patient ID: Julie Phillips, female   DOB: November 21, 1932, 88 y.o.   MRN: [id number] Cardiology Office Note  Date:  06/18/2022   ID:  Julie Phillips, DOB 10/02/1932, MRN [id number]  PCP:  Tonia Ghent, MD   Chief Complaint  Patient presents with   1 month follow up     Patient c/o shortness of breath with over exertion with occasional stabbing pains in chest. Patient would like to get her medications figured out, she doesn't feel the physicians are communicating with each other. Medications reviewed by the patient verbally.     HPI:  [name]  is a pleasant 88 year-old woman with remote history of  CAD, stent placed to mid LAD May 2023 Former smoker atrial fibrillation in 2007,  not on anticoagulation (no recurrence in 13 yrs) previously on warfarin, stopped, was falling alot In [place] in setting of hypokalemia (did stress test/pharmacological study) low potassium by her report,  hypertension  Ejection fraction 65 to 70% who presents for routine followup of her atrial fibrillation, CAD  LOV with myself 3/23 Frustrated by ventricular health : med changes Frustrated by the number of medications Started on aldosterone, off potassium Has not had side effects on this medication change  No leg edema, weight stable, feels that is only changed half pound since she was out of the hospital On lasix 20 daily, has not had to take extra Metolazone on her list but she does not have this and does not know what it is  Side effects on jardiance: SOB, dizziness Feels she is tolerating her other medications and does not want to make changes  Denies any tachypalpitations concerning for atrial fibrillation Does not want NOAC, prefers to stay on warfarin  EKG personally reviewed by myself on todays visit Normal sinus rhythm rate 85 bpm nonspecific ST abnormality  Other past medical history reviewed hospital April 2023 for fatigue, shortness of breath, orthopnea, leg swelling Noted to  be in atrial fibrillation with RVR with CHF exacerbation Cardioversion April 02, 2022 on amiodarone load at that time  Seen by one of our providers: 04/09/22: Did not feel well at that time Amiodarone down to 200 mg daily Cardiac CTA ordered  for chest pain  Seen by CHF clinic,  Ventricle heath: Side effects on jardiance and spironolactone, side effects of cough, dizziness  Cardiac CTA: 04/22/22 results reviewed with her in detail 1. Left Main:  No significant stenosis. 2. LAD: significant stenosis in the mid to distal LAD.  FFRct 0.70 3. LCX: significant proximal stenosis. FFR ct showed CTO of proximal LCx 4. RCA: No significant stenosis.  FFRct 0.82   1. CT FFR analysis showed significant stenosis in the mid LAD and proximal LCx.  EKG personally reviewed by myself on todays visit Sinus bradycardia: rate 59 bpm no significant ST-T wave changes  Atrial fibrillation history as detailed below cardioversion April 02, 2022 for atrial fibrillation 2 epsiodes of afib dec 2022 and feb 2023, lasting 24 hours  In the hospital 2/23: afib with RVR Converted to normal sinus rhythm in the hospital on diltiazem infusion  Echocardiogram done in the hospital normal ejection fraction, February 10, 2022 Mildly dilated left atrium mild MR  She has tried statins in the past and had a problem with her "urine". Details are uncertain   PMH:   has a past medical history of Arrhythmia, CHF (congestive heart failure) ([place]), Chronic heart failure with preserved ejection fraction (HFpEF) ([place]), Fall, GERD (gastroesophageal reflux disease),  History of chicken pox, colonic polyps, Hyperlipidemia, Hypertension, Hypothyroidism, Keratopathy (2018), LVH (left ventricular hypertrophy), and Persistent atrial fibrillation ([place]).  PSH:    Past Surgical History:  Procedure Laterality Date   CARDIOVASCULAR STRESS TEST  2011   normal    CARDIOVERSION N/A 04/02/2022   Procedure: CARDIOVERSION;  Surgeon: Minna Merritts,  MD;  Location: [facility];  Service: Cardiovascular;  Laterality: N/A;   CORONARY STENT INTERVENTION N/A 05/06/2022   Procedure: CORONARY STENT INTERVENTION;  Surgeon: Wellington Hampshire, MD;  Location: [facility];  Service: Cardiovascular;  Laterality: N/A;   keratopathy     LEFT HEART CATH AND CORONARY ANGIOGRAPHY N/A 05/06/2022   Procedure: LEFT HEART CATH AND CORONARY ANGIOGRAPHY;  Surgeon: Wellington Hampshire, MD;  Location: [facility];  Service: Cardiovascular;  Laterality: N/A;    Current Outpatient Medications  Medication Sig Dispense Refill   amiodarone (PACERONE) 200 MG tablet Take 1 tablet (200 mg total) by mouth daily. 90 tablet 3   clopidogrel (PLAVIX) 75 MG tablet Take 1 tablet (75 mg total) by mouth daily with breakfast. 30 tablet 6   diltiazem (CARDIZEM CD) 120 MG 24 hr capsule Take 1 capsule (120 mg total) by mouth daily. 90 capsule 3   furosemide (LASIX) 20 MG tablet Take 1 tablet (20 mg total) by mouth daily. 90 tablet 3   gabapentin (NEURONTIN) 100 MG capsule TAKE 1 CAPSULE BY MOUTH 3 TIMES DAILY ASNEEDED 270 capsule 3   levothyroxine (SYNTHROID) 100 MCG tablet TAKE 1 TABLET BY MOUTH ONCE A DAY BEFOREBREAKFAST. 90 tablet 3   losartan (COZAAR) 100 MG tablet Take 1 tablet (100 mg total) by mouth daily. 90 tablet 3   metoprolol succinate (TOPROL-XL) 50 MG 24 hr tablet Take 1 tablet (50 mg total) by mouth 2 (two) times daily. Take with or immediately following a meal. 180 tablet 0   nitroGLYCERIN (NITROSTAT) 0.4 MG SL tablet Place 1 tablet (0.4 mg total) under the tongue every 5 (five) minutes as needed for chest pain. 25 tablet 3   omeprazole (PRILOSEC) 20 MG capsule Take 1 capsule (20 mg total) by mouth daily. 90 capsule 3   Polyethyl Glycol-Propyl Glycol (SYSTANE) 0.4-0.3 % GEL ophthalmic gel Place 1 application into both eyes.     spironolactone (ALDACTONE) 25 MG tablet Take 1 tablet (25 mg total) by mouth daily. 90 tablet 3   warfarin (COUMADIN) 5 MG tablet  TAKE 1 TABLET (5 MG) BY MOUTH DAILY EXCEPT TAKE 1/2 TABLET (2.5 MG) ON MONDAYS AND THURSDAYS OR AS DIRECTED BY ANTICOAGULATION CLINIC 110 tablet 1   metolazone (ZAROXOLYN) 2.5 MG tablet Take 2.5 mg by mouth daily. (Patient not taking: Reported on 06/18/2022)     No current facility-administered medications for this visit.   Allergies:   Statins, Wheat, Amlodipine, Eucalyptus oil, Lisinopril, and Rice   Social History:  The patient  reports that she quit smoking about 50 years ago. Her smoking use included cigarettes. She has a 40.00 pack-year smoking history. She has never used smokeless tobacco. She reports that she does not currently use alcohol. She reports that she does not use drugs.   Family History:   family history includes Breast cancer in her daughter and sister; Heart disease in her father and mother.   Review of Systems  Constitutional: Negative.   HENT: Negative.    Respiratory: Negative.    Cardiovascular: Negative.   Gastrointestinal: Negative.   Musculoskeletal: Negative.        Leg  weakness, poor balance  Neurological: Negative.   Psychiatric/Behavioral: Negative.    All other systems reviewed and are negative.    PHYSICAL EXAM: VS:  BP 120/70 (BP Location: Left Arm, Patient Position: Sitting, Cuff Size: Large)   Pulse 85   Ht '5\' 1"'$  (1.549 m)   Wt 223 lb 6 oz (101.3 kg)   SpO2 98%   BMI 42.21 kg/m  , BMI Body mass index is 42.21 kg/m. Constitutional:  oriented to person, place, and time. No distress.  HENT:  Head: Grossly normal Eyes:  no discharge. No scleral icterus.  Neck: No JVD, no carotid bruits  Cardiovascular: Regular rate and rhythm, no murmurs appreciated Pulmonary/Chest: Clear to auscultation bilaterally, no wheezes or rails Abdominal: Soft.  no distension.  no tenderness.  Musculoskeletal: Normal range of motion Neurological:  normal muscle tone. Coordination normal. No atrophy Skin: Skin warm and dry Psychiatric: normal affect,  pleasant  Recent Labs: 04/01/2022: ALT 10; Magnesium 2.1; TSH 2.542 04/27/2022: B Natriuretic Peptide 295.5 05/07/2022: BUN 13; Creatinine, Ser 0.68; Hemoglobin 10.0; Platelets 248; Potassium 4.0; Sodium 138    Lipid Panel Lab Results  Component Value Date   CHOL 199 07/10/2021   HDL 49 (L) 07/10/2021   LDLCALC 127 (H) 07/10/2021   TRIG 124 07/10/2021      Wt Readings from Last 3 Encounters:  06/18/22 223 lb 6 oz (101.3 kg)  05/17/22 225 lb 8 oz (102.3 kg)  05/06/22 224 lb (101.6 kg)     ASSESSMENT AND PLAN:  Paroxysmal atrial fibrillation (HCC) -  hospitalization for atrial fibrillation with RVR, underwent cardioversion, Maintaining normal sinus rhythm on amiodarone, diltiazem, metoprolol succinate BID (prefers to continue BID) On warfarin, does not want to NOAC  HLD (hyperlipidemia) Does not want a statin,  myalgias among other side effects cholesterol around 200 Recommend she start Zetia Suggested she would likely need PCSK9 inhibitor, she prefers to check lab work on [place] first  Coronary artery disease with unstable angina Multivessel disease noted on cardiac CTA Stent placed to LAD Statin intolerance Start Zetia, would likely need PCSK9 in the future  Essential hypertension Blood pressure is well controlled on today's visit. No changes made to the medications.  Morbid obesity Low carbohydrate ,exercise program recommended  Leg swelling No significant leg swelling on today's visit  GERD Better on omeprazole   Total encounter time more than 40 minutes  Greater than 50% was spent in counseling and coordination of care with the patient   Orders Placed This Encounter  Procedures   EKG 12-Lead     Signed, Esmond Plants, M.D., Ph.D. 06/18/2022  [place], [place]

## 2022-06-17 NOTE — Patient Instructions (Addendum)
Pre visit review using our clinic review tool, if applicable. No additional management support is needed unless otherwise documented below in the visit note.  Increase dose today to take 1 1/2 tablet and then change weekly dose to take 1 tablet daily except take 1/2 tablet on Mondays. Recheck in 3 weeks.

## 2022-06-17 NOTE — Progress Notes (Addendum)
Pt reports new script for spironolactone and reduction in weight do to this new med. Pt reports she was not taking any warfarin on Mondays and not taking 1/2 tablet on Mondays.   Increase dose today to take 1 1/2 tablets and then change weekly dose to take 1 tablet daily except take 1/2 tablet on Mondays. Recheck in 3 weeks.

## 2022-06-18 ENCOUNTER — Encounter: Payer: Self-pay | Admitting: Cardiovascular Disease

## 2022-06-18 ENCOUNTER — Other Ambulatory Visit
Admission: RE | Admit: 2022-06-18 | Discharge: 2022-06-18 | Disposition: A | Discharge status: Home / Self Care | Payer: PPO | Attending: Cardiovascular Disease | Admitting: Cardiovascular Disease

## 2022-06-18 ENCOUNTER — Ambulatory Visit: Payer: PPO | Admitting: Cardiovascular Disease

## 2022-06-18 VITALS — BP 120/70 | HR 85 | Ht 61.0 in | Wt 223.4 lb

## 2022-06-18 DIAGNOSIS — I25118 Atherosclerotic heart disease of native coronary artery with other forms of angina pectoris: Secondary | ICD-10-CM | POA: Diagnosis not present

## 2022-06-18 DIAGNOSIS — T466X5A Adverse effect of antihyperlipidemic and antiarteriosclerotic drugs, initial encounter: Secondary | ICD-10-CM

## 2022-06-18 DIAGNOSIS — M791 Myalgia, unspecified site: Secondary | ICD-10-CM

## 2022-06-18 DIAGNOSIS — T466X5D Adverse effect of antihyperlipidemic and antiarteriosclerotic drugs, subsequent encounter: Secondary | ICD-10-CM | POA: Diagnosis not present

## 2022-06-18 DIAGNOSIS — I517 Cardiomegaly: Secondary | ICD-10-CM | POA: Diagnosis not present

## 2022-06-18 DIAGNOSIS — I5032 Chronic diastolic (congestive) heart failure: Secondary | ICD-10-CM

## 2022-06-18 DIAGNOSIS — Z6841 Body Mass Index (BMI) 40.0 and over, adult: Secondary | ICD-10-CM | POA: Diagnosis not present

## 2022-06-18 DIAGNOSIS — I480 Paroxysmal atrial fibrillation: Secondary | ICD-10-CM | POA: Diagnosis not present

## 2022-06-18 DIAGNOSIS — E782 Mixed hyperlipidemia: Secondary | ICD-10-CM

## 2022-06-18 DIAGNOSIS — I10 Essential (primary) hypertension: Secondary | ICD-10-CM

## 2022-06-18 LAB — BASIC METABOLIC PANEL
Anion gap: 3 — ABNORMAL LOW (ref 5–15)
BUN: 22 mg/dL (ref 8–23)
CO2: 30 mmol/L (ref 22–32)
Calcium: 9 mg/dL (ref 8.9–10.3)
Chloride: 106 mmol/L (ref 98–111)
Creatinine, Ser: 1.33 mg/dL — ABNORMAL HIGH (ref 0.44–1.00)
GFR, Estimated: 39 mL/min — ABNORMAL LOW (ref >60)
Glucose, Bld: 133 mg/dL — ABNORMAL HIGH (ref 70–99)
Potassium: 4 mmol/L (ref 3.5–5.1)
Sodium: 139 mmol/L (ref 135–145)

## 2022-06-18 MED ORDER — EZETIMIBE 10 MG PO TABS: 10.0000 mg | ORAL_TABLET | Freq: Every day | ORAL | 3 refills | Status: DC

## 2022-06-18 NOTE — Patient Instructions (Addendum)
Medication Instructions:  Please start zetia 10 mg daily for cholesterol  Lasix 20 daily with extra lasix for 3 pound weight gain  If you need a refill on your cardiac medications before your next appointment, please call your pharmacy.   Lab work: BMP today in hospital lobby  Testing/Procedures: No new testing needed  Follow-Up: At [facility], you and your health needs are our priority.  As part of our continuing mission to provide you with exceptional heart care, we have created designated Provider Care Teams.  These Care Teams include your primary Cardiologist (physician) and Advanced Practice Providers (APPs -  Physician Assistants and Nurse Practitioners) who all work together to provide you with the care you need, when you need it.  You will need a follow up appointment in 6 months  Providers on your designated Care Team:   Murray Hodgkins, NP Christell Faith, PA-C Cadence Kathlen Mody, [place]  COVID-19 Vaccine Information can be found at: [email or web address] For questions related to vaccine distribution or appointments, please email [email or web address] or call [phone].

## 2022-06-21 ENCOUNTER — Telehealth: Payer: Self-pay | Admitting: Emergency Medicine

## 2022-06-21 NOTE — Telephone Encounter (Signed)
Called patient, went over results and recommendations. Pt verbalized understanding, and questions, if any, were answered.  ?

## 2022-06-21 NOTE — Telephone Encounter (Signed)
-----   Message from Minna Merritts, MD sent at 06/20/2022 12:40 PM EDT ----- Electrolytes stable Slight increase in renal function concerning for running slightly dry Could consider small increase in fluid intake or could hold Lasix once or twice a week Potassium normal

## 2022-06-22 ENCOUNTER — Telehealth: Payer: Self-pay | Admitting: Family Medicine

## 2022-06-22 NOTE — Telephone Encounter (Signed)
Left message for patient to call back and schedule Medicare Annual Wellness Visit (AWV) either virtually or phone   Last AWV 07/09/19   I left my direct # [phone]

## 2022-06-23 ENCOUNTER — Telehealth: Payer: Self-pay | Admitting: Cardiovascular Disease

## 2022-06-23 NOTE — Telephone Encounter (Signed)
Pt c/o medication issue:  1. Name of Medication:   amiodarone (PACERONE) 200 MG tablet  metoprolol succinate (TOPROL-XL) 50 MG 24 hr tablet  losartan (COZAAR) 100 MG tablet  2. How are you currently taking this medication (dosage and times per day)?   As prescribed  3. Are you having a reaction (difficulty breathing--STAT)?   No  4. What is your medication issue?    Patient called stating she wants to stop taking these medication and would like to clarify medication instructions going forward.

## 2022-06-24 ENCOUNTER — Ambulatory Visit (INDEPENDENT_AMBULATORY_CARE_PROVIDER_SITE_OTHER): Payer: PPO

## 2022-06-24 VITALS — Ht 61.0 in | Wt 221.0 lb

## 2022-06-24 DIAGNOSIS — Z0000 Encounter for general adult medical examination without abnormal findings: Secondary | ICD-10-CM

## 2022-06-24 NOTE — Progress Notes (Signed)
I connected with Julie Phillips today by telephone and verified that I am speaking with the correct person using two identifiers. Location patient: home Location provider: work Persons participating in the virtual visit: Levy Wellman, Glenna Durand LPN.   I discussed the limitations, risks, security and privacy concerns of performing an evaluation and management service by telephone and the availability of in person appointments. I also discussed with the patient that there may be a patient responsible charge related to this service. The patient expressed understanding and verbally consented to this telephonic visit.    Interactive audio and video telecommunications were attempted between this provider and patient, however failed, due to patient having technical difficulties OR patient did not have access to video capability.  We continued and completed visit with audio only.     Vital signs may be patient reported or missing.  Subjective:   Julie Phillips is a 88 y.o. female who presents for Medicare Annual (Subsequent) preventive examination.  Review of Systems     Cardiac Risk Factors include: advanced age (>93mn, >>2women);dyslipidemia;hypertension;obesity (BMI >30kg/m2)     Objective:    Today's Vitals   06/24/22 1157  Weight: 221 lb (100.2 kg)  Height: '5\' 1"'$  (1.549 m)   Body mass index is 41.76 kg/m.     06/24/2022   12:05 PM 05/06/2022   11:00 PM 04/01/2022    8:00 PM 02/09/2022    6:05 PM 02/08/2022    5:17 PM 07/09/2019   11:21 AM 06/30/2018   11:20 AM  Advanced Directives  Does Patient Have a Medical Advance Directive? Yes Yes Yes  No Yes Yes  Type of AParamedicof [place]ass[place] will Living will [place]Living will   [place]od[place]Living will [place]Living will  Does patient want to make changes to medical advance directive?  No - Patient declined No - Patient declined      Copy of  HH[place]in Chart? Yes - validated most recent copy scanned in chart (See row information)  No - copy requested   Yes - validated most recent copy scanned in chart (See row information) Yes  Would patient like information on creating a medical advance directive?    No - Patient declined       Current Medications (verified) Outpatient Encounter Medications as of 06/24/2022  Medication Sig   amiodarone (PACERONE) 200 MG tablet Take 1 tablet (200 mg total) by mouth daily.   clopidogrel (PLAVIX) 75 MG tablet Take 1 tablet (75 mg total) by mouth daily with breakfast.   diltiazem (CARDIZEM CD) 120 MG 24 hr capsule Take 1 capsule (120 mg total) by mouth daily.   furosemide (LASIX) 20 MG tablet Take 1 tablet (20 mg total) by mouth daily.   levothyroxine (SYNTHROID) 100 MCG tablet TAKE 1 TABLET BY MOUTH ONCE A DAY BEFOREBREAKFAST.   losartan (COZAAR) 100 MG tablet Take 1 tablet (100 mg total) by mouth daily.   metoprolol succinate (TOPROL-XL) 50 MG 24 hr tablet Take 1 tablet (50 mg total) by mouth 2 (two) times daily. Take with or immediately following a meal.   nitroGLYCERIN (NITROSTAT) 0.4 MG SL tablet Place 1 tablet (0.4 mg total) under the tongue every 5 (five) minutes as needed for chest pain.   omeprazole (PRILOSEC) 20 MG capsule Take 1 capsule (20 mg total) by mouth daily.   Polyethyl Glycol-Propyl Glycol (SYSTANE) 0.4-0.3 % GEL ophthalmic gel Place 1 application into both eyes.  potassium chloride (KLOR-CON) 10 MEQ tablet Take 10 mEq by mouth daily.   spironolactone (ALDACTONE) 25 MG tablet Take 1 tablet (25 mg total) by mouth daily.   warfarin (COUMADIN) 5 MG tablet TAKE 1 TABLET (5 MG) BY MOUTH DAILY EXCEPT TAKE 1/2 TABLET (2.5 MG) ON MONDAYS AND THURSDAYS OR AS DIRECTED BY ANTICOAGULATION CLINIC   ezetimibe (ZETIA) 10 MG tablet Take 1 tablet (10 mg total) by mouth daily. (Patient not taking: Reported on 06/24/2022)   gabapentin (NEURONTIN) 100 MG capsule TAKE 1 CAPSULE BY  MOUTH 3 TIMES DAILY ASNEEDED (Patient not taking: Reported on 06/24/2022)   No facility-administered encounter medications on file as of 06/24/2022.    Allergies (verified) Statins, Wheat, Amlodipine, Eucalyptus oil, Lisinopril, and Rice   History: Past Medical History:  Diagnosis Date   Arrhythmia    atrial fibrillation   CHF (congestive heart failure) (HCC)    Chronic heart failure with preserved ejection fraction (HFpEF) ([facility])    a. 01/2022 Echo: EF 65-70%, no rwma, mild LVH, GrII DD, nl RV fxn, mildly dil LA, mild MR, ao scelrosis.   Fall    GERD (gastroesophageal reflux disease)    History of chicken pox    Hx of colonic polyps    Hyperlipidemia    Hypertension    Hypothyroidism    Keratopathy 2018   R eye   LVH (left ventricular hypertrophy)    Persistent atrial fibrillation ([facility])    a. Initially dx in 2012; b. 01/2022 recurrent AF rvr noted-->warfarin added (CHA2DS2VASc = 5); c. 03/2022 Amio added-->DCCV x 1 (150J).   Past Surgical History:  Procedure Laterality Date   CARDIOVASCULAR STRESS TEST  2011   normal    CARDIOVERSION N/A 04/02/2022   Procedure: CARDIOVERSION;  Surgeon: Minna Merritts, MD;  Location: [facility];  Service: Cardiovascular;  Laterality: N/A;   CORONARY STENT INTERVENTION N/A 05/06/2022   Procedure: CORONARY STENT INTERVENTION;  Surgeon: Wellington Hampshire, MD;  Location: [facility];  Service: Cardiovascular;  Laterality: N/A;   keratopathy     LEFT HEART CATH AND CORONARY ANGIOGRAPHY N/A 05/06/2022   Procedure: LEFT HEART CATH AND CORONARY ANGIOGRAPHY;  Surgeon: Wellington Hampshire, MD;  Location: [facility];  Service: Cardiovascular;  Laterality: N/A;   Family History  Problem Relation Age of Onset   Heart disease Mother    Heart disease Father    Breast cancer Sister    Breast cancer Daughter    Colon cancer Neg Hx    Social History   Socioeconomic History   Marital status: Widowed    Spouse name: Not on file   Number of  children: Not on file   Years of education: Not on file   Highest education level: Not on file  Occupational History   Occupation: Retired Armed forces technical officer: RETIRED  Tobacco Use   Smoking status: Former    Packs/day: 2.00    Years: 20.00    Total pack years: 40.00    Types: Cigarettes    Quit date: 12/14/1971    Years since quitting: 50.5   Smokeless tobacco: Never  Vaping Use   Vaping Use: Never used  Substance and Sexual Activity   Alcohol use: Not Currently   Drug use: No   Sexual activity: Not Currently  Other Topics Concern   Not on file  Social History Narrative   Divorced   Education:  [employer]   8 pregnancies, 6 live births  LMP: 1985   Lives in independent living.     Social Determinants of Health   Financial Resource Strain: Low Risk  (06/24/2022)   Overall Financial Resource Strain (CARDIA)    Difficulty of Paying Living Expenses: Not hard at all  Food Insecurity: No Food Insecurity (06/24/2022)   Hunger Vital Sign    Worried About Running Out of Food in the Last Year: Never true    Ran Out of Food in the Last Year: Never true  Transportation Needs: No Transportation Needs (07/09/2019)   PRAPARE - Hydrologist (Medical): No    Lack of Transportation (Non-Medical): No  Physical Activity: Inactive (06/24/2022)   Exercise Vital Sign    Days of Exercise per Week: 0 days    Minutes of Exercise per Session: 0 min  Stress: No Stress Concern Present (06/24/2022)   [place]    Feeling of Stress : Not at all  Social Connections: Not on file    Tobacco Counseling Counseling given: Not Answered   Clinical Intake:  Pre-visit preparation completed: Yes  Pain : No/denies pain     Nutritional Status: BMI > 30  Obese Nutritional Risks: None Diabetes: No  How often do you need to have someone help you when you read instructions, pamphlets, or  other written materials from your doctor or pharmacy?: 1 - Never What is the last grade level you completed in school?: 47yrcollege  Diabetic? no  Interpreter Needed?: No  Information entered by :: Nallen LPN   Activities of Daily Living    06/24/2022   12:07 PM 06/22/2022    6:42 PM  In your present state of health, do you have any difficulty performing the following activities:  Hearing? 0 0  Vision? 0 0  Difficulty concentrating or making decisions? 0 0  Walking or climbing stairs? 0 0  Dressing or bathing? 0 0  Doing errands, shopping? 0 0  Preparing Food and eating ? N N  Using the Toilet? N N  In the past six months, have you accidently leaked urine? N N  Do you have problems with loss of bowel control? N N  Managing your Medications? N N  Managing your Finances? N N  Housekeeping or managing your Housekeeping? N N    Patient Care Team: DTonia Ghent MD as PCP - General (Family Medicine) GRockey SituTKathlene November MD as PCP - Cardiology (Cardiology) GMinna Merritts MD as Consulting Physician (Cardiology) KDoyce Para MD as Consulting Physician (Ophthalmology) DEstill Cotta MD as Consulting Physician (Ophthalmology) FCharlton Haws [facility]urg[facility] LLCas Pharmacist (Pharmacist)  Indicate any recent Medical Services you may have received from other than [facility] providers in the past year (date may be approximate).     Assessment:   This is a routine wellness examination for JAudrielle  Hearing/Vision screen Vision Screening - Comments:: Regular eye exams,   Dietary issues and exercise activities discussed: Current Exercise Habits: The patient does not participate in regular exercise at present   Goals Addressed             This Visit's Progress    Patient Stated       06/24/2022, wants to lose weight       Depression Screen    06/24/2022   12:06 PM 04/15/2022   10:53 AM 07/09/2019   11:23 AM 06/30/2018   11:23 AM 06/22/2017    9:12 AM 06/17/2016   10:15 AM  01/14/2015    11:11 AM  PHQ 2/9 Scores  PHQ - 2 Score 0 0 0 0 1 0 0  PHQ- 9 Score   0 0       Fall Risk    06/24/2022   12:06 PM 06/22/2022    6:42 PM 05/17/2022   11:02 AM 04/15/2022   10:53 AM 07/10/2021    2:30 PM  [place] in the past year? 0 0 0 0 0  Number falls in past yr: 0 0 0 0 0  Injury with Fall? 0 0 0 0 0  Risk for fall due to : Medication side effect   Impaired balance/gait;Impaired mobility   Follow up Falls evaluation completed;Education provided;Falls prevention discussed  Falls evaluation completed Falls evaluation completed;Education provided;Falls prevention discussed Falls evaluation completed    FALL RISK PREVENTION PERTAINING TO THE HOME:  Any stairs in or around the home? No  If so, are there any without handrails?  N/a Home free of loose throw rugs in walkways, pet beds, electrical cords, etc? Yes  Adequate lighting in your home to reduce risk of falls? Yes   ASSISTIVE DEVICES UTILIZED TO PREVENT FALLS:  Life alert? No  Use of a cane, walker or w/c? No  Grab bars in the bathroom? Yes  Shower chair or bench in shower? No  Elevated toilet seat or a handicapped toilet? Yes   TIMED UP AND GO:  Was the test performed? No .       Cognitive Function:    07/09/2019   11:32 AM 06/30/2018   11:23 AM 06/22/2017    9:18 AM 06/17/2016   10:18 AM  MMSE - Mini Mental State Exam  Orientation to time '5 5 5 5  '$ Orientation to Place '5 5 5 5  '$ Registration '3 3 3 3  '$ Attention/ Calculation 5 0 0 0  Recall '3 2 3 3  '$ Recall-comments  unable to recall 1 of 3 words    Language- name 2 objects 0 0 0 0  Language- repeat '1 1 1 1  '$ Language- follow 3 step command 0 '3 3 3  '$ Language- read & follow direction 0 0 0 0  Write a sentence 0 0 0 0  Copy design 0 0 0 0  Total score '22 19 20 20        '$ 06/24/2022   12:08 PM  6CIT Screen  What Year? 0 points  What month? 0 points  What time? 0 points  Count back from 20 0 points  Months in reverse 0 points  Repeat phrase 0  points  Total Score 0 points    Immunizations Immunization History  Administered Date(s) Administered   Fluad Quad(high Dose 65+) 09/11/2019, 11/09/2019   Influenza Split 10/04/2012   Influenza, High Dose Seasonal PF 10/19/2021   Influenza,inj,Quad PF,6+ Mos 10/08/2013, 09/25/2015, 09/14/2016, 10/06/2017   Influenza-Unspecified 09/13/2011, 08/13/2014, 10/13/2018   PFIZER(Purple Top)SARS-COV-2 Vaccination 01/03/2020, 01/21/2020, 09/11/2020, 04/18/2021   Pneumococcal Conjugate-13 08/13/2014   Pneumococcal Polysaccharide-23 12/13/2010   Td 12/13/2010    TDAP status: Due, Education has been provided regarding the importance of this vaccine. Advised may receive this vaccine at local pharmacy or Health Dept. Aware to provide a copy of the vaccination record if obtained from local pharmacy or Health Dept. Verbalized acceptance and understanding.  Flu Vaccine status: Up to date  Pneumococcal vaccine status: Up to date  Covid-19 vaccine status: Completed vaccines  Qualifies for Shingles Vaccine? Yes   Zostavax completed No  Shingrix Completed?: No.    Education has been provided regarding the importance of this vaccine. Patient has been advised to call insurance company to determine out of pocket expense if they have not yet received this vaccine. Advised may also receive vaccine at local pharmacy or Health Dept. Verbalized acceptance and understanding.  Screening Tests Health Maintenance  Topic Date Due   TETANUS/TDAP  12/13/2020   COVID-19 Vaccine (5 - Pfizer series) 06/13/2021   Zoster Vaccines- Shingrix (1 of 2) 09/24/2022 (Originally 10/09/1982)   INFLUENZA VACCINE  07/13/2022   Pneumonia Vaccine 67+ Years old  Completed   DEXA SCAN  Completed   HPV VACCINES  Aged Out    Health Maintenance  Health Maintenance Due  Topic Date Due   TETANUS/TDAP  12/13/2020   COVID-19 Vaccine (5 - Pfizer series) 06/13/2021    Colorectal cancer screening: No longer required.   Mammogram  status: No longer required due to age.  Bone Density status: Completed 02/12/2015.   Lung Cancer Screening: (Low Dose CT Chest recommended if Age 65-80 years, 30 pack-year currently smoking OR have quit w/in 15years.) does not qualify.   Lung Cancer Screening Referral: no  Additional Screening:  Hepatitis C Screening: does not qualify;   Vision Screening: Recommended annual ophthalmology exams for early detection of glaucoma and other disorders of the eye. Is the patient up to date with their annual eye exam?  Yes  Who is the provider or what is the name of the office in which the patient attends annual eye exams? none If pt is not established with a provider, would they like to be referred to a provider to establish care? No .   Dental Screening: Recommended annual dental exams for proper oral hygiene  Community Resource Referral / Chronic Care Management: CRR required this visit?  No   CCM required this visit?  No      Plan:     I have personally reviewed and noted the following in the patient's chart:   Medical and social history Use of alcohol, tobacco or illicit drugs  Current medications and supplements including opioid prescriptions.  Functional ability and status Nutritional status Physical activity Advanced directives List of other physicians Hospitalizations, surgeries, and ER visits in previous 12 months Vitals Screenings to include cognitive, depression, and falls Referrals and appointments  In addition, I have reviewed and discussed with patient certain preventive protocols, quality metrics, and best practice recommendations. A written personalized care plan for preventive services as well as general preventive health recommendations were provided to patient.     Kellie Simmering, LPN   1/57/2620   Nurse Notes: none  Due to this being a virtual visit, the after visit summary with patients personalized plan was offered to patient via mail or my-chart.  Patient would like to access on my-chart

## 2022-06-24 NOTE — Patient Instructions (Signed)
Julie Phillips , Thank you for taking time to come for your Medicare Wellness Visit. I appreciate your ongoing commitment to your health goals. Please review the following plan we discussed and let me know if I can assist you in the future.   Screening recommendations/referrals: Colonoscopy: not required Mammogram: not required Bone Density: completed 02/12/2015 Recommended yearly ophthalmology/optometry visit for glaucoma screening and checkup Recommended yearly dental visit for hygiene and checkup  Vaccinations: Influenza vaccine: due 07/13/2022 Pneumococcal vaccine: completed 08/13/2014 Tdap vaccine: due Shingles vaccine: discussed   Covid-19: 04/18/2021, 09/11/2020, 01/21/2020, 01/03/2020  Advanced directives: copy in chart  Conditions/risks identified: none  Next appointment: Follow up in one year for your annual wellness visit    Preventive Care 86 Years and Older, Female Preventive care refers to lifestyle choices and visits with your health care provider that can promote health and wellness. What does preventive care include? A yearly physical exam. This is also called an annual well check. Dental exams once or twice a year. Routine eye exams. Ask your health care provider how often you should have your eyes checked. Personal lifestyle choices, including: Daily care of your teeth and gums. Regular physical activity. Eating a healthy diet. Avoiding tobacco and drug use. Limiting alcohol use. Practicing safe sex. Taking low-dose aspirin every day. Taking vitamin and mineral supplements as recommended by your health care provider. What happens during an annual well check? The services and screenings done by your health care provider during your annual well check will depend on your age, overall health, lifestyle risk factors, and family history of disease. Counseling  Your health care provider may ask you questions about your: Alcohol use. Tobacco use. Drug use. Emotional  well-being. Home and relationship well-being. Sexual activity. Eating habits. History of falls. Memory and ability to understand (cognition). Work and work Statistician. Reproductive health. Screening  You may have the following tests or measurements: Height, weight, and BMI. Blood pressure. Lipid and cholesterol levels. These may be checked every 5 years, or more frequently if you are over 35 years old. Skin check. Lung cancer screening. You may have this screening every year starting at age 46 if you have a 30-pack-year history of smoking and currently smoke or have quit within the past 15 years. Fecal occult blood test (FOBT) of the stool. You may have this test every year starting at age 29. Flexible sigmoidoscopy or colonoscopy. You may have a sigmoidoscopy every 5 years or a colonoscopy every 10 years starting at age 97. Hepatitis C blood test. Hepatitis B blood test. Sexually transmitted disease (STD) testing. Diabetes screening. This is done by checking your blood sugar (glucose) after you have not eaten for a while (fasting). You may have this done every 1-3 years. Bone density scan. This is done to screen for osteoporosis. You may have this done starting at age 86. Mammogram. This may be done every 1-2 years. Talk to your health care provider about how often you should have regular mammograms. Talk with your health care provider about your test results, treatment options, and if necessary, the need for more tests. Vaccines  Your health care provider may recommend certain vaccines, such as: Influenza vaccine. This is recommended every year. Tetanus, diphtheria, and acellular pertussis (Tdap, Td) vaccine. You may need a Td booster every 10 years. Zoster vaccine. You may need this after age 42. Pneumococcal 13-valent conjugate (PCV13) vaccine. One dose is recommended after age 49. Pneumococcal polysaccharide (PPSV23) vaccine. One dose is recommended after age 21. Talk  to your  health care provider about which screenings and vaccines you need and how often you need them. This information is not intended to replace advice given to you by your health care provider. Make sure you discuss any questions you have with your health care provider. Document Released: 12/26/2015 Document Revised: 08/18/2016 Document Reviewed: 09/30/2015 Elsevier Interactive Patient Education  2017 [place] Prevention in the Home Falls can cause injuries. They can happen to people of all ages. There are many things you can do to make your home safe and to help prevent falls. What can I do on the outside of my home? Regularly fix the edges of walkways and driveways and fix any cracks. Remove anything that might make you trip as you walk through a door, such as a raised step or threshold. Trim any bushes or trees on the path to your home. Use bright outdoor lighting. Clear any walking paths of anything that might make someone trip, such as rocks or tools. Regularly check to see if handrails are loose or broken. Make sure that both sides of any steps have handrails. Any raised decks and porches should have guardrails on the edges. Have any leaves, snow, or ice cleared regularly. Use sand or salt on walking paths during winter. Clean up any spills in your garage right away. This includes oil or grease spills. What can I do in the bathroom? Use night lights. Install grab bars by the toilet and in the tub and shower. Do not use towel bars as grab bars. Use non-skid mats or decals in the tub or shower. If you need to sit down in the shower, use a plastic, non-slip stool. Keep the floor dry. Clean up any water that spills on the floor as soon as it happens. Remove soap buildup in the tub or shower regularly. Attach bath mats securely with double-sided non-slip rug tape. Do not have throw rugs and other things on the floor that can make you trip. What can I do in the bedroom? Use night  lights. Make sure that you have a light by your bed that is easy to reach. Do not use any sheets or blankets that are too big for your bed. They should not hang down onto the floor. Have a firm chair that has side arms. You can use this for support while you get dressed. Do not have throw rugs and other things on the floor that can make you trip. What can I do in the kitchen? Clean up any spills right away. Avoid walking on wet floors. Keep items that you use a lot in easy-to-reach places. If you need to reach something above you, use a strong step stool that has a grab bar. Keep electrical cords out of the way. Do not use floor polish or wax that makes floors slippery. If you must use wax, use non-skid floor wax. Do not have throw rugs and other things on the floor that can make you trip. What can I do with my stairs? Do not leave any items on the stairs. Make sure that there are handrails on both sides of the stairs and use them. Fix handrails that are broken or loose. Make sure that handrails are as long as the stairways. Check any carpeting to make sure that it is firmly attached to the stairs. Fix any carpet that is loose or worn. Avoid having throw rugs at the top or bottom of the stairs. If you do have throw rugs,  attach them to the floor with carpet tape. Make sure that you have a light switch at the top of the stairs and the bottom of the stairs. If you do not have them, ask someone to add them for you. What else can I do to help prevent falls? Wear shoes that: Do not have high heels. Have rubber bottoms. Are comfortable and fit you well. Are closed at the toe. Do not wear sandals. If you use a stepladder: Make sure that it is fully opened. Do not climb a closed stepladder. Make sure that both sides of the stepladder are locked into place. Ask someone to hold it for you, if possible. Clearly mark and make sure that you can see: Any grab bars or handrails. First and last  steps. Where the edge of each step is. Use tools that help you move around (mobility aids) if they are needed. These include: Canes. Walkers. Scooters. Crutches. Turn on the lights when you go into a dark area. Replace any light bulbs as soon as they burn out. Set up your furniture so you have a clear path. Avoid moving your furniture around. If any of your floors are uneven, fix them. If there are any pets around you, be aware of where they are. Review your medicines with your doctor. Some medicines can make you feel dizzy. This can increase your chance of falling. Ask your doctor what other things that you can do to help prevent falls. This information is not intended to replace advice given to you by your health care provider. Make sure you discuss any questions you have with your health care provider. Document Released: 09/25/2009 Document Revised: 05/06/2016 Document Reviewed: 01/03/2015 Elsevier Interactive Patient Education  2017 [employer].

## 2022-06-28 NOTE — Telephone Encounter (Signed)
Patient replied to [employer]. Closing this encounter.

## 2022-06-28 NOTE — Telephone Encounter (Signed)
Called patient. No answer. Lmtcb. Also advised patient that I would send her a mychart message regarding her concerns and she could reply to that or call.

## 2022-07-01 ENCOUNTER — Encounter: Payer: Self-pay | Admitting: Family Medicine

## 2022-07-01 ENCOUNTER — Ambulatory Visit (INDEPENDENT_AMBULATORY_CARE_PROVIDER_SITE_OTHER): Payer: PPO | Admitting: Family Medicine

## 2022-07-01 VITALS — BP 118/70 | HR 60 | Temp 97.3°F | Ht 61.0 in | Wt 226.0 lb

## 2022-07-01 DIAGNOSIS — I25119 Atherosclerotic heart disease of native coronary artery with unspecified angina pectoris: Secondary | ICD-10-CM | POA: Diagnosis not present

## 2022-07-01 DIAGNOSIS — Z7189 Other specified counseling: Secondary | ICD-10-CM

## 2022-07-01 DIAGNOSIS — I10 Essential (primary) hypertension: Secondary | ICD-10-CM | POA: Diagnosis not present

## 2022-07-01 DIAGNOSIS — E039 Hypothyroidism, unspecified: Secondary | ICD-10-CM | POA: Diagnosis not present

## 2022-07-01 DIAGNOSIS — Z0000 Encounter for general adult medical examination without abnormal findings: Secondary | ICD-10-CM

## 2022-07-01 DIAGNOSIS — I4891 Unspecified atrial fibrillation: Secondary | ICD-10-CM | POA: Diagnosis not present

## 2022-07-01 DIAGNOSIS — H539 Unspecified visual disturbance: Secondary | ICD-10-CM | POA: Diagnosis not present

## 2022-07-01 DIAGNOSIS — K219 Gastro-esophageal reflux disease without esophagitis: Secondary | ICD-10-CM

## 2022-07-01 MED ORDER — PANTOPRAZOLE SODIUM 20 MG PO TBEC: 20.0000 mg | DELAYED_RELEASE_TABLET | Freq: Every day | ORAL | 3 refills | Status: DC

## 2022-07-01 MED ORDER — LEVOTHYROXINE SODIUM 100 MCG PO TABS: ORAL_TABLET | ORAL | 3 refills | Status: DC

## 2022-07-01 NOTE — Patient Instructions (Addendum)
You should get a call about the eye clinic appointment.    Call if needed.   [facility] [place], [place] [address] [phone]  Recheck lipids and blood counts in about 2 months at visit in about 2 months.    Take care.  Glad to see you.

## 2022-07-01 NOTE — Progress Notes (Signed)
She hasn't started zetia yet.  D/w pt about starting and recheck labs about 2 months later.    CAD.  She clearly feels better after stent.  Exercise capacity is clearly better.   Hypertension:    Using medication without problems or lightheadedness: yes Chest pain with exertion:no.  She has occ L sided pain for a few seconds at rest but not exertional sx.  D/w pt about observing for now.  Happened 3 times in a week then resolved and didn't happen in the meantime.  No sx currently and no exertional symptoms. Edema:no Short of breath:no  Anticoagulated for AF.  No bleeding.  No falls.  No heart racing.    Hypothyroidism.  TSH wnl 2023.  Compliant.  No ADE on med.    Advance directive- daughter Su Grand if patient were incapacitated. DNR reaffirmed 07/10/21  Mammogram declined.  D/w pt.  I'll defer to patient. DEXA declined.  I'll defer to patient.  Vaccines d/w pt.   GERD usually controlled but d/w pt about changing omeprazole to pantoprazole with plavix use.    She had vision changes and d/w pt about eye clinic referral.  H/o band keratopathy.    Meds, vitals, and allergies reviewed.   ROS: Per HPI unless specifically indicated in ROS section   GEN: nad, alert and oriented HEENT: ncat NECK: supple w/o LA CV: IRR not tachy PULM: ctab, no inc wob ABD: soft, +bs EXT: no edema SKIN: no acute rash Using walker at baseline.

## 2022-07-04 DIAGNOSIS — H539 Unspecified visual disturbance: Secondary | ICD-10-CM | POA: Insufficient documentation

## 2022-07-04 NOTE — Assessment & Plan Note (Signed)
No chest pain now.  Observe and report if needed.  Continue Plavix amiodarone diltiazem metoprolol and warfarin.

## 2022-07-04 NOTE — Assessment & Plan Note (Signed)
Advance directive- daughter Su Grand if patient were incapacitated.

## 2022-07-04 NOTE — Assessment & Plan Note (Signed)
TSH wnl 2023.  Compliant.  No ADE on med.   Continue as is with levothyroxine.

## 2022-07-04 NOTE — Assessment & Plan Note (Signed)
- 

## 2022-07-04 NOTE — Assessment & Plan Note (Signed)
She clearly feels better in the meantime.  Continue warfarin metoprolol losartan furosemide Plavix diltiazem and amiodarone.  She is going to start Zetia.  See AVS.  30 minutes were devoted to patient care in this encounter (this includes time spent reviewing the patient's file/history, interviewing and examining the patient, counseling/reviewing plan with patient).

## 2022-07-04 NOTE — Assessment & Plan Note (Signed)
Advance directive- daughter Julie Phillips if patient were incapacitated. DNR reaffirmed 07/10/21  Mammogram declined.  D/w pt.  I'll defer to patient. DEXA declined.  I'll defer to patient.  Vaccines d/w pt.

## 2022-07-04 NOTE — Assessment & Plan Note (Signed)
GERD usually controlled but d/w pt about changing omeprazole to pantoprazole with plavix use.  Rx sent.

## 2022-07-08 ENCOUNTER — Encounter: Payer: Self-pay | Admitting: Family Medicine

## 2022-07-08 ENCOUNTER — Ambulatory Visit (INDEPENDENT_AMBULATORY_CARE_PROVIDER_SITE_OTHER): Payer: PPO

## 2022-07-08 DIAGNOSIS — Z7901 Long term (current) use of anticoagulants: Secondary | ICD-10-CM

## 2022-07-08 LAB — POCT INR: INR: 1.9 — AB (ref 2.0–3.0)

## 2022-07-08 NOTE — Patient Instructions (Addendum)
Pre visit review using our clinic review tool, if applicable. No additional management support is needed unless otherwise documented below in the visit note.  Increase dose today to take 1 1/2 tablets and then change weekly dose to take 1 tablet daily.  Recheck in 3 weeks.

## 2022-07-08 NOTE — Progress Notes (Signed)
Increase dose today to take 1 1/2 tablets and then change weekly dose to take 1 tablet daily.  Recheck in 3 weeks.

## 2022-07-09 ENCOUNTER — Telehealth: Payer: Self-pay

## 2022-07-09 NOTE — Chronic Care Management (AMB) (Signed)
    Chronic Care Management Pharmacy Assistant   Name: Julie Phillips  MRN: [id number] DOB: 12/10/1932   Reason for Encounter: Reminder Call   Conditions to be addressed/monitored: CAD, HTN, and [facility] visits:  05/06/22- Rogue Jury Arida,MD([facility]) -cardiac catheterization  Admitted to the hospital on 04/01/22 due to CHF. Discharge date was 04/02/22. Discharged from [facility].    New?Medications Started at [facility] Discharge:?? -started Amiodarone  Furosemide  Medication Changes at Hospital Discharge: -Changed metoprolol succinate  Medications Discontinued at Hospital Discharge: -Stopped metoprolol tartrate  Medications that remain the same after Hospital Discharge:??  -All other medications will remain the same.  Admitted to the hospital on 02/08/22 due to Afib. Discharge date was 02/10/22. Discharged from [facility].    New?Medications Started at [facility] Discharge:?? -started Diltiazem  Lovenox  coumadin  Medication Changes at Hospital Discharge: -Changed vitamin B-12  Medications Discontinued at Hospital Discharge: -Stopped aspirin '81mg'$    Medications that remain the same after Hospital Discharge:??  -All other medications will remain the same.       Medications: Outpatient Encounter Medications as of 07/09/2022  Medication Sig   amiodarone (PACERONE) 200 MG tablet Take 1 tablet (200 mg total) by mouth daily.   clopidogrel (PLAVIX) 75 MG tablet Take 1 tablet (75 mg total) by mouth daily with breakfast.   diltiazem (CARDIZEM CD) 120 MG 24 hr capsule Take 1 capsule (120 mg total) by mouth daily.   ezetimibe (ZETIA) 10 MG tablet Take 1 tablet (10 mg total) by mouth daily.   furosemide (LASIX) 20 MG tablet Take 1 tablet (20 mg total) by mouth daily.   gabapentin (NEURONTIN) 100 MG capsule TAKE 1 CAPSULE BY MOUTH 3 TIMES DAILY ASNEEDED   levothyroxine (SYNTHROID) 100 MCG tablet TAKE 1 TABLET BY MOUTH ONCE A DAY BEFOREBREAKFAST.   losartan (COZAAR) 100 MG  tablet Take 1 tablet (100 mg total) by mouth daily.   metoprolol succinate (TOPROL-XL) 50 MG 24 hr tablet Take 1 tablet (50 mg total) by mouth 2 (two) times daily. Take with or immediately following a meal.   nitroGLYCERIN (NITROSTAT) 0.4 MG SL tablet Place 1 tablet (0.4 mg total) under the tongue every 5 (five) minutes as needed for chest pain.   pantoprazole (PROTONIX) 20 MG tablet Take 1 tablet (20 mg total) by mouth daily.   Polyethyl Glycol-Propyl Glycol (SYSTANE) 0.4-0.3 % GEL ophthalmic gel Place 1 application into both eyes.   potassium chloride (KLOR-CON) 10 MEQ tablet Take 10 mEq by mouth daily.   warfarin (COUMADIN) 5 MG tablet TAKE 1 TABLET (5 MG) BY MOUTH DAILY EXCEPT TAKE 1/2 TABLET (2.5 MG) ON MONDAYS AND THURSDAYS OR AS DIRECTED BY ANTICOAGULATION CLINIC   No facility-administered encounter medications on file as of 07/09/2022.    Julie Phillips was contacted to remind of upcoming telephone visit with Charlene Brooke  on 07/14/22 at 11:00am. Patient was reminded to have any blood glucose and blood pressure readings available for review at appointment.   Patient confirmed appointment.   Are you having any problems with your medications? No   Do you have any concerns you like to discuss with the pharmacist? No   CCM referral has been placed prior to visit?  No    [name] Rating Drugs: Medication:  Last Fill: Day Supply Losartan '100mg'$  06/24/22 [place], CPP notified  Avel Sensor, [place]  [phone]

## 2022-07-14 ENCOUNTER — Telehealth: Payer: Self-pay | Admitting: Pharmacist

## 2022-07-14 ENCOUNTER — Ambulatory Visit (INDEPENDENT_AMBULATORY_CARE_PROVIDER_SITE_OTHER): Payer: PPO | Admitting: Pharmacist

## 2022-07-14 DIAGNOSIS — I480 Paroxysmal atrial fibrillation: Secondary | ICD-10-CM

## 2022-07-14 DIAGNOSIS — I25119 Atherosclerotic heart disease of native coronary artery with unspecified angina pectoris: Secondary | ICD-10-CM

## 2022-07-14 DIAGNOSIS — I10 Essential (primary) hypertension: Secondary | ICD-10-CM

## 2022-07-14 DIAGNOSIS — E039 Hypothyroidism, unspecified: Secondary | ICD-10-CM

## 2022-07-14 DIAGNOSIS — K219 Gastro-esophageal reflux disease without esophagitis: Secondary | ICD-10-CM

## 2022-07-14 NOTE — Addendum Note (Signed)
Addended by: Tonia Ghent on: 07/14/2022 10:24 PM   Modules accepted: Orders

## 2022-07-14 NOTE — Patient Instructions (Signed)
Visit Information  Phone number for Pharmacist: [phone]   Goals Addressed   None     Care Plan : [place]  Updates made by Charlton Haws, RPH since 07/14/2022 12:00 AM     Problem: Hypertension, Hyperlipidemia, Atrial Fibrillation, GERD, Hypothyroidism, and Osteopenia   Priority: High     Long-Range Goal: Disease mgmt   Start Date: 01/15/2022  Expected End Date: 01/15/2023  This Visit's Progress: On track  Recent Progress: On track  Priority: High  Note:   Current Barriers:  Suboptimal therapeutic regimen for CAD/HLD  Pharmacist Clinical Goal(s):  Patient will adhere to plan to optimize therapeutic regimen for CAD/HLD as evidenced by report of adherence to recommended medication management changes through collaboration with PharmD and provider.   Interventions: 1:1 collaboration with Tonia Ghent, MD regarding development and update of comprehensive plan of care as evidenced by provider attestation and co-signature Inter-disciplinary care team collaboration (see longitudinal plan of care) Comprehensive medication review performed; medication list updated in electronic medical record  Hypertension / Heart Failure (BP goal <140/90) -Controlled - BP at home is at goal, HR is somewhat low at times;  -Home BP: 111/52, HR 49 today -Weight 222 - 226 lbs, pt is not sure why she gains several pounds overnight. Discussed fluid accumulation in heart failure, role of diuretic and salt restriction -Last ejection fraction: 65-70% (Date: 02/10/22) -HF type: Diastolic (grade II dysfunction); NYHA Class: II (slight limitation of activity) -Current treatment: Losartan 100 mg daily PM - Appropriate, Effective, Safe, Accessible Metoprolol succinate 50 mg BID - Appropriate, Effective, Safe, Accessible Furosemide 20 mg daily - Appropriate, Effective, Safe, Accessible Diltiazem CD 120 mg daily - Appropriate, Effective, Safe, Accessible -Medications previously tried:  amlodipine, lisinopril, Jardiance, spironolactone -Educated on BP goals and benefits of medications for prevention of heart attack, stroke and kidney damage; -Educated on Benefits of medications for managing symptoms and prolonging life Importance of weighing daily; Proper diuretic administration and potassium supplementation -Counseled to monitor BP and weight at home daily; advised to contact cardiology for wt gain 3+ lbs overnight or 5+ lbs in a week -Recommended to continue current medication  Atrial Fibrillation (Goal: prevent stroke and major bleeding) -Not ideally controlled - INR has been subtherapeutic since May 2023, pt has follow up with coumadin clinic in 2 weeks -CHADSVASC: 4; Dx 2007, recurrence 01/2022 and started on warfarin at that time -Current treatment: Metoprolol succinate 50 mg daily - Appropriate, Effective, Safe, Accessible Diltiazem CD 120 mg daily -Appropriate, Effective, Safe, Accessible Amiodarone 200 mg daily -Appropriate, Effective, Safe, Accessible Warfarin 5 mg as directed -Appropriate, Query Effective -Medications previously tried: warfarin (stopped due to frequent falls) -Counseled on importance of adherence to anticoagulant exactly as prescribed; bleeding risk associated with warfarin and importance of self-monitoring for signs/symptoms of bleeding; importance of regular laboratory monitoring; -Reviewed anticoagulation options - pt had declined Factor Xa inhibitors due to cost -Recommended to continue current medication  Hyperlipidemia / CAD (LDL goal < 130) -Uncontrolled - LDL 127 (06/2021) above goal; she has refused statins; she stopped taking ezetimibe after a week due to side effects (fatigue, "felt awful") -Of not pt has not yet switched omeprazole to pantoprazole (drug interaction with clopidogrel) - reviewed drug interaction and risk for clopidogrel failure if she continues to use omeprazole, risks include restenosis of recent stent -Coronary CT 04/2022  - Ca score 902 (87th percentile). Stent to LAD 04/2022. -Has taken NTG once (before cath) -Current treatment: Clopidogrel 75 mg daily -  Appropriate, Effective, Safe, Accessible Ezetimibe 10 mg daily - stopped after 1 week Nitroglycerin 0.4 mg SL prn -Appropriate, Effective, Safe, Accessible -Medications previously tried: ezetimibe   -Educated on Cholesterol goals - LDL goal < 70 now that she has established ASCVD. Discussed difference between total cholesterol and LDL, importance of achieving LDL goal to prevent ASCVD recurrence; since she has not tolerated ezetimibe and refuses statins, next option would be PCSK9-inhibitors - she is wary of injections but should be able to handle auto-injector device. Alerted cardiologist of ezetimibe failure and Advised pt to follow up with cardiology to discuss PCSK9-inhibitors -Advised pt to stop omeprazole and switch to pantoprazole today, to avoid interaction with clopidogrel  Osteopenia (Goal prevent fractures) -Not ideally controlled -pt is not taking calcium or vitamin D supplements -Last DEXA Scan: 02/2015   T-Score femoral neck: -1.6  T-Score forearm radius: 0.0  10-year probability of major osteoporotic fracture: 12.2%  10-year probability of hip fracture: 2.2% -Patient is not a candidate for pharmacologic treatment -Current treatment  None -Medications previously tried: none  -Recommend weight-bearing and muscle strengthening exercises for building and maintaining bone density.  Hypothyroidism (Goal: maintain TSH in goal range) -Controlled - pt reports she feels like her levothyroxine dose has to be increased, she is fatigued;  -Pt reports levothyroxine copay increased to $10/month ($30 per 90 days) this year; she can consider switching this to [employer] ($4 list) -Current treatment  Levothyroxine 100 mcg daily -Appropriate, Effective, Safe, Accessible -Discussed absorption of levothyroxine is optimal on empty stomach, separate from other meds;   -Recommended to continue current medication; repeat TSH at upcoming coumadin visit  GERD (Goal: manage symptoms) -Not ideally controlled - pt has not yet switched omeprazole to pantoprazole - see Hyperlipidemia section above -Current treatment  Pantoprazole 20 mg daily -Appropriate, Effective, Safe, Accessible TUMS prn -Medications previously tried: omeprazole (plavix DDI) -Recommend to stop omeprazole and start pantoprazole immediately  Pain (Goal: manage pain) -Controlled - pt does not take gabapentin often -"cracked disc" in back since teenage years -Current treatment  Gabapentin 100 mg TID prn -Appropriate, Effective, Safe, Accessible -Medications previously tried: n/a  -Recommended to continue current medication  Health Maintenance -Vaccine gaps: Flu, TDAP, Shingrix  Patient Goals/Self-Care Activities Patient will:  - take medications as prescribed as evidenced by patient report and record review focus on medication adherence by routine check blood pressure daily, document, and provide at future appointments weigh daily, and contact provider if weight gain of 3+ lbs overnight or 5+ lbs in 1 week -stop omeprazole and switch to pantoprazole immediately      Patient verbalizes understanding of instructions and care plan provided today and agrees to view in [place]. Active MyChart status and patient understanding of how to access instructions and care plan via MyChart confirmed with patient.    Telephone follow up appointment with pharmacy team member scheduled for: 2 months  Charlene Brooke, PharmD, [facility] Clinical Pharmacist [facility] [phone]

## 2022-07-14 NOTE — Telephone Encounter (Signed)
I added her future TSH lab.  I routed this to [facility] to see if these labs could all get collected at her visit on 07/29/2022.  I thank all involved.

## 2022-07-14 NOTE — Telephone Encounter (Signed)
Discussed lipids with patient - last LDL was 127 (06/2021), she has had cardiac catheterization since then and ezetimibe was started (she has refused statins). She stopped ezetimibe after a week due to side effects (fatigue, generally "feeling awful"). The plan was originally to re-check lipids after 2 months on ezetimibe, but since she has stopped it early, may be better to re-check labs sooner to establish baseline. Discussed LDL goal < 70, next option would be PCSK9-inhibitors which have already been mentioned by cardiologist Dr Rockey Situ.  She also reports she thinks levothyroxine dose needs to be increased due to persistent fatigue. Advised we will need to repeat TSH before changing the dose.  Recommendations: -Repeat lipid panel (already ordered) and TSH (needs order) with upcoming anti-coag appt (07/29/22)

## 2022-07-14 NOTE — Progress Notes (Signed)
Chronic Care Management Pharmacy Note  07/14/2022 Name:  Julie Phillips MRN:  [id number] DOB:  [birth year]  Summary: CCM F/U visit -Reviewed medications, discovered discrepancies: -Pt has stopped ezetimibe after a week due to fatigue, "felt awful"; last LDL was 127 (06/2021) and is due for re-check; reviewed new LDL goal < 70 given recent ASCVD (stent to LAD 04/2022). She has refused statins so next option would be PCSK9-inhibitor -Pt has not yet switched omeprazole to pantoprazole (in light of clopidogrel interaction) - reviewed risks of clopidogrel failure including restenosis of recent stent -Pt thinks levothyroxine needs to be increased, she is feeling more fatigued; this is likely multifactorial given multiple heart rate control agents, new dx CHF and AFib  Recommendations/Changes made from today's visit: -Recommend repeat lipid panel to establish baseline; consider PCSK9-inhibitor to achieve LDL goal < 70 - advised pt to follow up with cardiology -Advised to stop omeprazole and switch to pantoprazole immediately -Recommend repeat TSH - can be done with upcoming Anti-Coag visit (07/29/22)  Plan: -[facility] will call patient 1 month for adherence update (for above changes) -Pharmacist follow up televisit scheduled for 2 months   Subjective: Julie Phillips is an 88 y.o. year old female who is a primary patient of Damita Dunnings, Elveria Rising, MD.  The CCM team was consulted for assistance with disease management and care coordination needs.    Engaged with patient by telephone for follow up visit in response to provider referral for pharmacy case management and/or care coordination services.   Consent to Services:  The patient was given information about Chronic Care Management services, agreed to services, and gave verbal consent prior to initiation of services.  Please see initial visit note for detailed documentation.   Patient Care Team: Tonia Ghent, MD as PCP - General (Family  Medicine) Rockey Situ Kathlene November, MD as PCP - Cardiology (Cardiology) Minna Merritts, MD as Consulting Physician (Cardiology) Doyce Para, MD as Consulting Physician (Ophthalmology) Estill Cotta, MD as Consulting Physician (Ophthalmology) Charlton Haws, [facility] as Pharmacist (Pharmacist)  Recent office visits: 07/01/22 Dr Damita Dunnings OV: f/u - switch omeprazole to pantoprazole 20 mg.  04/01/22 NP Romilda Garret OV: acute visit while at coumadin appt. HF 140, SOB. Sent to ED.  02/12/22 Dr Damita Dunnings OV: Hospital f/u. New warfarin start. Pt cannot work Lovenox for bridge. Reasonable to start warfarin without it and accept higher risk for stroke until therapeutic INR is achieved.  07/10/21 Dr Damita Dunnings OV: annual visit. C/O tremor. Reduced levothyroxine to 100 mcg (TSH 0.32)  Recent consult visits: 06/18/22 Dr Rockey Situ (Cardiology): f/u - start ezetimibe 10 mg. D/c aspirin, metolazone. 04/26/22 Dr Rockey Situ (Cardiology): f/u - d/c Jardiance and spironolactone (not tolerated). CAD noted on CTA - schedule cardiac cath.  04/15/22 NP Darylene Price (HF clinic): Start Jardiance and spironolactone. 04/09/22 NP Ignacia Bayley (Cardiology): hospital f/u - bradycardic 56 bpm. Reduce amiodarone to 200 mg daily. Referred to pulmonology for sleep study.  03/01/22 Dr Rockey Situ (Cardiology): f/u Afib. Ordered Coronary CT. 01/01/22-Cardiology-Timothy Gollan,MD-Patient presented for chest pain.Recommend she try nitro sublingual for possible esophageal or coronary spasm,could order cardiac CTA if chest pain continues.  11/02/21-Cardiology-Timothy Gollan,MD-Patient presents for follow up atrial fibrillation.EKG  Hospital visits: 05/06/22 - 05/07/22 Admission [facility]) - L heart cath. Stent to LAD. Continue aspirin and plavix, once INR therapeutic stop aspirin. Start Zetia, look into PCSK9.  04/01/22 - 04/02/22 Admission [facility]): acute CHF and AFIB w/ RVR. Cardioversion performed. Start amiodarone 400 mg BID x 5 days, then  200 mg BID.  02/08/22 -  02/10/22 admission [facility]): Afib with RVR. Tx with IV Cardizem and oral metoprolol. Started on wafarin with Lovenox bridge.    Objective:  Lab Results  Component Value Date   CREATININE 1.33 (H) 06/18/2022   BUN 22 06/18/2022   GFR 93.04 08/04/2020   GFRNONAA 39 (L) 06/18/2022   GFRAA 90 03/25/2020   NA 139 06/18/2022   K 4.0 06/18/2022   CALCIUM 9.0 06/18/2022   CO2 30 06/18/2022   GLUCOSE 133 (H) 06/18/2022    Lab Results  Component Value Date/Time   GFR 93.04 08/04/2020 11:47 AM   GFR 80.91 07/09/2019 11:26 AM    Last diabetic Eye exam: No results found for: "HMDIABEYEEXA"  Last diabetic Foot exam: No results found for: "HMDIABFOOTEX"   Lab Results  Component Value Date   CHOL 199 07/10/2021   HDL 49 (L) 07/10/2021   LDLCALC 127 (H) 07/10/2021   LDLDIRECT 152.6 12/31/2013   TRIG 124 07/10/2021   CHOLHDL 4.1 07/10/2021       Latest Ref Rng & Units 04/01/2022   11:10 AM 02/08/2022    5:19 PM 07/10/2021    3:08 PM  Hepatic Function  Total Protein 6.5 - 8.1 g/dL 7.4  7.0  6.7   Albumin 3.5 - 5.0 g/dL 3.5  3.4    AST 15 - 41 U/L _0 ALT 0 - 44 U/L _1 Alk Phosphatase 38 - 126 U/L 91  85    Total Bilirubin 0.3 - 1.2 mg/dL 0.8  0.7  0.4   Bilirubin, Direct 0.0 - 0.2 mg/dL 0.2       Lab Results  Component Value Date/Time   TSH 2.542 04/01/2022 11:10 AM   TSH 1.125 02/09/2022 04:34 AM   TSH 1.26 09/14/2021 09:55 AM   TSH 0.32 (L) 07/10/2021 03:08 PM       Latest Ref Rng & Units 05/07/2022    6:06 AM 05/06/2022    9:24 PM 04/27/2022   10:21 AM  CBC  WBC 4.0 - 10.5 K/uL 8.6  8.2  9.9   Hemoglobin 12.0 - 15.0 g/dL 10.0  6.2  11.8   Hematocrit 36.0 - 46.0 % 32.4  21.6  37.3   Platelets 150 - 400 K/uL 248  443  336    Lab Results  Component Value Date/Time   INR 1.9 (A) 07/08/2022 12:00 AM   INR 1.7 (A) 06/17/2022 12:00 AM   INR 1.2 05/07/2022 06:06 AM   INR 2.1 (H) 04/02/2022 09:05 AM     Lab Results  Component Value Date/Time   VD25OH 35  07/10/2021 03:08 PM   VD25OH 31.42 08/04/2020 11:47 AM   VD25OH 34.70 07/09/2019 11:26 AM    Clinical ASCVD: No  The ASCVD Risk score (Arnett DK, et al., 2019) failed to calculate for the following reasons:   The 2019 ASCVD risk score is only valid for ages 7 to 52       06/24/2022   12:06 PM 04/15/2022   10:53 AM 07/09/2019   11:23 AM  Depression screen PHQ 2/9  Decreased Interest 0 0 0  Down, Depressed, Hopeless 0 0 0  PHQ - 2 Score 0 0 0  Altered sleeping   0  Tired, decreased energy   0  Change in appetite   0  Feeling bad or failure about yourself    0  Trouble concentrating   0  Moving slowly or fidgety/restless   0  Suicidal thoughts   0  PHQ-9 Score   0     CHA2DS2/VAS Stroke Risk Points  Current as of 2 days ago (Wednesday)     4 >= 2 Points: High Risk  1 - 1.99 Points: Medium Risk  0 Points: Low Risk    Last Change: N/A      Points Metrics  0 Has Congestive Heart Failure:  No    Current as of 2 days ago (Wednesday)  0 Has Vascular Disease:  No    Current as of 2 days ago (Wednesday)  1 Has Hypertension:  Yes    Current as of 2 days ago (Wednesday)  2 Age:  87    Current as of 2 days ago (Wednesday)  0 Has Diabetes:  No    Current as of 2 days ago (Wednesday)  0 Had Stroke:  No  Had TIA:  No  Had Thromboembolism:  No    Current as of 2 days ago (Wednesday)  1 Female:  Yes    Current as of 2 days ago (Wednesday)      Social History   Tobacco Use  Smoking Status Former   Packs/day: 2.00   Years: 20.00   Total pack years: 40.00   Types: Cigarettes   Quit date: 12/14/1971   Years since quitting: 50.6  Smokeless Tobacco Never   BP Readings from Last 3 Encounters:  07/01/22 118/70  06/18/22 120/70  05/17/22 131/60   Pulse Readings from Last 3 Encounters:  07/01/22 60  06/18/22 85  05/17/22 (!) 56   Wt Readings from Last 3 Encounters:  07/01/22 226 lb (102.5 kg)  06/24/22 221 lb (100.2 kg)  06/18/22 223 lb 6 oz (101.3 kg)   BMI Readings from  Last 3 Encounters:  07/01/22 42.70 kg/m  06/24/22 41.76 kg/m  06/18/22 42.21 kg/m    Assessment/Interventions: Review of patient past medical history, allergies, medications, health status, including review of consultants reports, laboratory and other test data, was performed as part of comprehensive evaluation and provision of chronic care management services.   SDOH:  (Social Determinants of Health) assessments and interventions performed: No - done 06/2022   SDOH Screenings   Alcohol Screen: Not on file  Depression (PHQ2-9): Low Risk  (06/24/2022)   Depression (PHQ2-9)    PHQ-2 Score: 0  Financial Resource Strain: Low Risk  (06/24/2022)   Overall Financial Resource Strain (CARDIA)    Difficulty of Paying Living Expenses: Not hard at all  Food Insecurity: No Food Insecurity (06/24/2022)   Hunger Vital Sign    Worried About Running Out of Food in the Last Year: Never true    Ran Out of Food in the Last Year: Never true  Housing: Not on file  Physical Activity: Inactive (06/24/2022)   Exercise Vital Sign    Days of Exercise per Week: 0 days    Minutes of Exercise per Session: 0 min  Social Connections: Not on file  Stress: No Stress Concern Present (06/24/2022)   [place]    Feeling of Stress : Not at all  Tobacco Use: Medium Risk (07/01/2022)   Patient History    Smoking Tobacco Use: Former    Smokeless Tobacco Use: Never    Passive Exposure: Not on file  Transportation Needs: No Transportation Needs (07/09/2019)   PRAPARE - Transportation    Lack of Transportation (Medical): No    Lack of  Transportation (Non-Medical): No    CCM Care Plan  Allergies  Allergen Reactions   Statins     Myalgias   Wheat     Throat swelling   Amlodipine     hematuria   Eucalyptus Oil    Lisinopril     Unrecalled BP med- possibly lisinopril- intolerant, had cough on the medicine- changed to cozaar   Rice Nausea And  Vomiting    Medications Reviewed Today     Reviewed by Tonia Ghent, MD (Physician) on 07/01/22 at 1150  Med List Status: <None>   Medication Order Taking? Sig Documenting Provider Last Dose Status Informant  amiodarone (PACERONE) 200 MG tablet [id number] Yes Take 1 tablet (200 mg total) by mouth daily. Theora Gianotti, NP Taking Active   clopidogrel (PLAVIX) 75 MG tablet [id number] Yes Take 1 tablet (75 mg total) by mouth daily with breakfast. Gerrie Nordmann, NP Taking Active   diltiazem (CARDIZEM CD) 120 MG 24 hr capsule [id number] Yes Take 1 capsule (120 mg total) by mouth daily. Minna Merritts, MD Taking Active Self  ezetimibe (ZETIA) 10 MG tablet [id number] Yes Take 1 tablet (10 mg total) by mouth daily. Minna Merritts, MD Taking Active   furosemide (LASIX) 20 MG tablet [id number] Yes Take 1 tablet (20 mg total) by mouth daily. Minna Merritts, MD Taking Active   gabapentin (NEURONTIN) 100 MG capsule [id number] Yes TAKE 1 CAPSULE BY MOUTH 3 TIMES DAILY ASNEEDED Tonia Ghent, MD Taking Active Self  levothyroxine (SYNTHROID) 100 MCG tablet [id number] Yes TAKE 1 TABLET BY MOUTH ONCE A DAY BEFOREBREAKFAST. Tonia Ghent, MD Taking Active Self  losartan (COZAAR) 100 MG tablet [id number] Yes Take 1 tablet (100 mg total) by mouth daily. Minna Merritts, MD Taking Active Self  metoprolol succinate (TOPROL-XL) 50 MG 24 hr tablet [id number] Yes Take 1 tablet (50 mg total) by mouth 2 (two) times daily. Take with or immediately following a meal. Gollan, Kathlene November, MD Taking Active   nitroGLYCERIN (NITROSTAT) 0.4 MG SL tablet [id number] Yes Place 1 tablet (0.4 mg total) under the tongue every 5 (five) minutes as needed for chest pain. Minna Merritts, MD Taking Active Self  omeprazole (PRILOSEC) 20 MG capsule [id number] Yes Take 1 capsule (20 mg total) by mouth daily. Tonia Ghent, MD Taking Active Self  Polyethyl Glycol-Propyl Glycol (SYSTANE) 0.4-0.3 % GEL ophthalmic gel  [id number] Yes Place 1 application into both eyes. [provider] Taking Active Self  potassium chloride (KLOR-CON) 10 MEQ tablet [id number] Yes Take 10 mEq by mouth daily. [provider] Taking Active Self  warfarin (COUMADIN) 5 MG tablet [id number] Yes TAKE 1 TABLET (5 MG) BY MOUTH DAILY EXCEPT TAKE 1/2 TABLET (2.5 MG) ON MONDAYS AND THURSDAYS OR AS DIRECTED BY ANTICOAGULATION CLINIC Tonia Ghent, MD Taking Active             Patient Active Problem List   Diagnosis Date Noted   Vision changes 07/04/2022   Coronary artery disease involving native coronary artery of native heart with angina pectoris ([place]) 05/07/2022   Effort angina ([place]) 05/06/2022   Unstable angina ([place]) 04/26/2022   Atrial fibrillation ([place]) 04/02/2022   Acute on chronic diastolic CHF (congestive heart failure) ([place]) 04/01/2022   Atrial fibrillation with RVR ([place]) 02/08/2022   Tremor of both hands 07/12/2021   GERD (gastroesophageal reflux disease) 07/18/2019   Hyperglycemia 07/09/2018   Vitamin D deficiency 07/09/2018   Sciatica of left side 46/50/3546   Umbilical  abnormality 11/13/2017   Healthcare maintenance 06/28/2017   Toenail avulsion 06/28/2017   DNR (do not resuscitate) 06/27/2017   Band keratopathy 01/16/2017   Risk for falls 06/21/2016   Rotator cuff disorder 12/10/2015   Shortness of breath 12/10/2015   Osteopenia 02/13/2015   Advance care planning 01/15/2015   Food allergy 07/09/2013   Medicare annual wellness visit, subsequent 12/26/2012   Knee pain 07/20/2012   Rash 04/11/2012   Hypothyroid 04/11/2012   LVH (left ventricular hypertrophy) 01/17/2012   Chronic atrial fibrillation with RVR ([place]) 01/06/2012   Obesity 01/06/2012   Essential hypertension 01/06/2012   Mixed hyperlipidemia 01/06/2012    Immunization History  Administered Date(s) Administered   Fluad Quad(high Dose 65+) 09/11/2019, 11/09/2019   Influenza Split 10/04/2012   Influenza, High Dose Seasonal PF  10/19/2021   Influenza,inj,Quad PF,6+ Mos 10/08/2013, 09/25/2015, 09/14/2016, 10/06/2017   Influenza-Unspecified 09/13/2011, 08/13/2014, 10/13/2018   PFIZER(Purple Top)SARS-COV-2 Vaccination 01/03/2020, 01/21/2020, 09/11/2020, 04/18/2021   Pneumococcal Conjugate-13 08/13/2014   Pneumococcal Polysaccharide-23 12/13/2010   Td 12/13/2010    Conditions to be addressed/monitored:  Hypertension, Hyperlipidemia, Atrial Fibrillation, GERD, Hypothyroidism, and Osteopenia  Care Plan : [place]  Updates made by Charlton Haws, [place] since 07/14/2022 12:00 AM     Problem: Hypertension, Hyperlipidemia, Atrial Fibrillation, GERD, Hypothyroidism, and Osteopenia   Priority: High     Long-Range Goal: Disease mgmt   Start Date: 01/15/2022  Expected End Date: 01/15/2023  This Visit's Progress: On track  Recent Progress: On track  Priority: High  Note:   Current Barriers:  Suboptimal therapeutic regimen for CAD/HLD  Pharmacist Clinical Goal(s):  Patient will adhere to plan to optimize therapeutic regimen for CAD/HLD as evidenced by report of adherence to recommended medication management changes through collaboration with PharmD and provider.   Interventions: 1:1 collaboration with Tonia Ghent, MD regarding development and update of comprehensive plan of care as evidenced by provider attestation and co-signature Inter-disciplinary care team collaboration (see longitudinal plan of care) Comprehensive medication review performed; medication list updated in electronic medical record  Hypertension / Heart Failure (BP goal <140/90) -Controlled - BP at home is at goal, HR is somewhat low at times;  -Home BP: 111/52, HR 49 today -Weight 222 - 226 lbs, pt is not sure why she gains several pounds overnight. Discussed fluid accumulation in heart failure, role of diuretic and salt restriction -Last ejection fraction: 65-70% (Date: 02/10/22) -HF type: Diastolic (grade II dysfunction); NYHA  Class: II (slight limitation of activity) -Current treatment: Losartan 100 mg daily PM - Appropriate, Effective, Safe, Accessible Metoprolol succinate 50 mg BID - Appropriate, Effective, Safe, Accessible Furosemide 20 mg daily - Appropriate, Effective, Safe, Accessible Diltiazem CD 120 mg daily - Appropriate, Effective, Safe, Accessible -Medications previously tried: amlodipine, lisinopril, Jardiance, spironolactone -Educated on BP goals and benefits of medications for prevention of heart attack, stroke and kidney damage; -Educated on Benefits of medications for managing symptoms and prolonging life Importance of weighing daily; Proper diuretic administration and potassium supplementation -Counseled to monitor BP and weight at home daily; advised to contact cardiology for wt gain 3+ lbs overnight or 5+ lbs in a week -Recommended to continue current medication  Atrial Fibrillation (Goal: prevent stroke and major bleeding) -Not ideally controlled - INR has been subtherapeutic since May 2023, pt has follow up with coumadin clinic in 2 weeks -CHADSVASC: 4; Dx 2007, recurrence 01/2022 and started on warfarin at that time -Current treatment: Metoprolol succinate 50 mg daily - Appropriate, Effective, Safe, Accessible  Diltiazem CD 120 mg daily -Appropriate, Effective, Safe, Accessible Amiodarone 200 mg daily -Appropriate, Effective, Safe, Accessible Warfarin 5 mg as directed -Appropriate, Query Effective -Medications previously tried: warfarin (stopped due to frequent falls) -Counseled on importance of adherence to anticoagulant exactly as prescribed; bleeding risk associated with warfarin and importance of self-monitoring for signs/symptoms of bleeding; importance of regular laboratory monitoring; -Reviewed anticoagulation options - pt had declined Factor Xa inhibitors due to cost -Recommended to continue current medication  Hyperlipidemia / CAD (LDL goal < 130) -Uncontrolled - LDL 127 (06/2021)  above goal; she has refused statins; she stopped taking ezetimibe after a week due to side effects (fatigue, "felt awful") -Of not pt has not yet switched omeprazole to pantoprazole (drug interaction with clopidogrel) - reviewed drug interaction and risk for clopidogrel failure if she continues to use omeprazole, risks include restenosis of recent stent -Coronary CT 04/2022 - Ca score 902 (87th percentile). Stent to LAD 04/2022. -Has taken NTG once (before cath) -Current treatment: Clopidogrel 75 mg daily - Appropriate, Effective, Safe, Accessible Ezetimibe 10 mg daily - stopped after 1 week Nitroglycerin 0.4 mg SL prn -Appropriate, Effective, Safe, Accessible -Medications previously tried: ezetimibe   -Educated on Cholesterol goals - LDL goal < 70 now that she has established ASCVD. Discussed difference between total cholesterol and LDL, importance of achieving LDL goal to prevent ASCVD recurrence; since she has not tolerated ezetimibe and refuses statins, next option would be PCSK9-inhibitors - she is wary of injections but should be able to handle auto-injector device. Alerted cardiologist of ezetimibe failure and Advised pt to follow up with cardiology to discuss PCSK9-inhibitors -Advised pt to stop omeprazole and switch to pantoprazole today, to avoid interaction with clopidogrel  Osteopenia (Goal prevent fractures) -Not ideally controlled -pt is not taking calcium or vitamin D supplements -Last DEXA Scan: 02/2015   T-Score femoral neck: -1.6  T-Score forearm radius: 0.0  10-year probability of major osteoporotic fracture: 12.2%  10-year probability of hip fracture: 2.2% -Patient is not a candidate for pharmacologic treatment -Current treatment  None -Medications previously tried: none  -Recommend weight-bearing and muscle strengthening exercises for building and maintaining bone density.  Hypothyroidism (Goal: maintain TSH in goal range) -Controlled - pt reports she feels like her  levothyroxine dose has to be increased, she is fatigued;  -Pt reports levothyroxine copay increased to $10/month ($30 per 90 days) this year; she can consider switching this to [employer] ($4 list) -Current treatment  Levothyroxine 100 mcg daily -Appropriate, Effective, Safe, Accessible -Discussed absorption of levothyroxine is optimal on empty stomach, separate from other meds;  -Recommended to continue current medication; repeat TSH at upcoming coumadin visit  GERD (Goal: manage symptoms) -Not ideally controlled - pt has not yet switched omeprazole to pantoprazole - see Hyperlipidemia section above -Current treatment  Pantoprazole 20 mg daily -Appropriate, Effective, Safe, Accessible TUMS prn -Medications previously tried: omeprazole (plavix DDI) -Recommend to stop omeprazole and start pantoprazole immediately  Pain (Goal: manage pain) -Controlled - pt does not take gabapentin often -"cracked disc" in back since teenage years -Current treatment  Gabapentin 100 mg TID prn -Appropriate, Effective, Safe, Accessible -Medications previously tried: n/a  -Recommended to continue current medication  Health Maintenance -Vaccine gaps: Flu, TDAP, Shingrix  Patient Goals/Self-Care Activities Patient will:  - take medications as prescribed as evidenced by patient report and record review focus on medication adherence by routine check blood pressure daily, document, and provide at future appointments weigh daily, and contact provider if weight gain of 3+ lbs  overnight or 5+ lbs in 1 week -stop omeprazole and switch to pantoprazole immediately       Medication Assistance: None required.  Patient affirms current coverage meets needs.  Compliance/Adherence/Medication fill history: Care Gaps: None  [name]-Rating Drugs: Losartan - LF 11/23/21 x 90 ds; PDC 85%  Medication Access: Within the past 30 days, how often has patient missed a dose of medication? 0 Is a pillbox or other method used  to improve adherence? No  Factors that may affect medication adherence? adverse effects of medications and lack of understanding of disease management Are meds synced by current pharmacy? No  Are meds delivered by current pharmacy? No  Does patient experience delays in picking up medications due to transportation concerns? No   [facility] Services Reviewed: Is patient disadvantaged to use [facility]?: No  Current Rx insurance plan: HTA Name and location of Current pharmacy:  [facility] [place] [address] [place] [address] Phone: [phone] Fax: [phone]  [facility] services reviewed with patient today?: No  Patient requests to transfer care to [facility]?: No  Reason patient declined to change pharmacies: Loyalty to other pharmacy/Patient preference   Care Plan and Follow Up Patient Decision:  Patient agrees to Care Plan and Follow-up.  Plan: Telephone follow up appointment with care management team member scheduled for:  2 months  Charlene Brooke, PharmD, [facility] Clinical Pharmacist [facility] [phone]

## 2022-07-15 NOTE — Telephone Encounter (Signed)
Added pt to lab schedule for 8/17 when she is here for coumadin clinic.  Sent my chart msg to inform her.

## 2022-07-27 ENCOUNTER — Telehealth: Payer: Self-pay

## 2022-07-27 NOTE — Telephone Encounter (Signed)
Pt LVM reporting her daughter in [place], [place], had a medical emergency and she needs to go up to spend at least a week with her. She requested to cancel coumadin clinic apt and lab apt for 8/17.  Cancelled both apts. Pt will call when she is back in town to [employer] both apts.

## 2022-07-28 NOTE — Telephone Encounter (Signed)
Noted. Thanks.

## 2022-07-28 NOTE — Telephone Encounter (Signed)
Documentation for mychart msgs  Hi Julie Phillips, I am so sorry to hear about everything happening with your daughter. I did receive your voicemail yesterday. I have cancelled your appointment with me and your lab appointment for tomorrow. If there is anything you need please do not hesitate to contact the office. Please follow up with me when you are settle back home so we can get you rescheduled. Take care and best wishes for your daughter. Best regards, Julie Beach, RN   Julie Phillips to Me       07/28/22  2:58 PM Hi Julie Phillips Come to ou probably have seen by now that I've canceled tomorrow for both bloodwork  My daughter who I told you about was taken to ER in [place] and then sent to [facility] for a psych evaluation and we we're putting heads together to get her into safe housing  Yesterday she was supposed to be released and I was going to stay with her Before they were going to release they decided to get a MRI That was not available at that hospital so she was sent to the [facility] it all hit the fan She complained about chest pain so did ekg ultrasound plus a a CT SCAN Then they found her liver was damaged and looked like cancer Sincr then they backed off that Anyway that's why I canceled  I Don't know where I'll be with any surety See Julie Phillips

## 2022-07-29 ENCOUNTER — Other Ambulatory Visit: Payer: PPO

## 2022-07-29 ENCOUNTER — Ambulatory Visit: Payer: PPO

## 2022-08-06 ENCOUNTER — Telehealth: Payer: Self-pay | Admitting: Family Medicine

## 2022-08-06 NOTE — Telephone Encounter (Signed)
Patient called in stating she is going through a lot of emotions right now. She stated her daughter was diagnosed with cancer and Alzheimers. She stated its a bit overwhelming for her right now and she just breaks down crying at any moment. Transferred over to Behavioral health at [facility] as patient just wanted to talk to someone and also make you aware. Thank you!

## 2022-08-06 NOTE — Telephone Encounter (Signed)
Spoke to pt. She said behavioral health has set her up with a virtual visit on Monday. I advised her I would let Dr Damita Dunnings know.

## 2022-08-06 NOTE — Telephone Encounter (Signed)
Pt reports she is doing better after talking to several people. She has recently learned he daughter has metastatic cancer and also alzheimers. There is no treatment for cancer and she will be in the hospital of SNF for the rest of her life. Pt also reports she lost her younger brother 2 weeks ago. She is concerned her daughter is now in [place] and she cannot always get up there to see her. She is currently home and is coping by talking to friends and "eating Janine Limbo and junk food." Pt wanted to schedule for coumadin clinic for next week. Scheduled pt and advised if anything is needed do not hesitate to contact the office or coumadin clinic. Pt verbalized understanding and thanks everyone for their help today.

## 2022-08-08 NOTE — Telephone Encounter (Signed)
I sent the patient a note but please check on her on Tuesday, after her appointment at behavioral health on Monday.  Thanks.

## 2022-08-09 ENCOUNTER — Ambulatory Visit (INDEPENDENT_AMBULATORY_CARE_PROVIDER_SITE_OTHER): Payer: PPO | Admitting: Clinical

## 2022-08-09 ENCOUNTER — Other Ambulatory Visit: Payer: Self-pay | Admitting: Internal Medicine

## 2022-08-09 DIAGNOSIS — F4321 Adjustment disorder with depressed mood: Secondary | ICD-10-CM | POA: Diagnosis not present

## 2022-08-09 NOTE — Telephone Encounter (Signed)
Placing pt on lab schedule for TSH lab for 8/31 when she also has an apt with coumadin clinic.
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
## 2022-08-09 NOTE — Progress Notes (Signed)
                Bradyn Soward, LCSW 

## 2022-08-10 NOTE — Telephone Encounter (Signed)
Called and spoke with patient. She states she is doing better today. States that the appt with behavioral health yesterday was very helpful.

## 2022-08-10 NOTE — Telephone Encounter (Signed)
Glad to hear. Thanks.

## 2022-08-12 ENCOUNTER — Other Ambulatory Visit (INDEPENDENT_AMBULATORY_CARE_PROVIDER_SITE_OTHER): Payer: PPO

## 2022-08-12 ENCOUNTER — Ambulatory Visit (INDEPENDENT_AMBULATORY_CARE_PROVIDER_SITE_OTHER): Payer: PPO

## 2022-08-12 DIAGNOSIS — I25119 Atherosclerotic heart disease of native coronary artery with unspecified angina pectoris: Secondary | ICD-10-CM | POA: Diagnosis not present

## 2022-08-12 DIAGNOSIS — I509 Heart failure, unspecified: Secondary | ICD-10-CM | POA: Diagnosis not present

## 2022-08-12 DIAGNOSIS — I110 Hypertensive heart disease with heart failure: Secondary | ICD-10-CM

## 2022-08-12 DIAGNOSIS — E039 Hypothyroidism, unspecified: Secondary | ICD-10-CM | POA: Diagnosis not present

## 2022-08-12 DIAGNOSIS — I4891 Unspecified atrial fibrillation: Secondary | ICD-10-CM

## 2022-08-12 DIAGNOSIS — I10 Essential (primary) hypertension: Secondary | ICD-10-CM

## 2022-08-12 DIAGNOSIS — I2510 Atherosclerotic heart disease of native coronary artery without angina pectoris: Secondary | ICD-10-CM

## 2022-08-12 DIAGNOSIS — M8580 Other specified disorders of bone density and structure, unspecified site: Secondary | ICD-10-CM

## 2022-08-12 DIAGNOSIS — E785 Hyperlipidemia, unspecified: Secondary | ICD-10-CM | POA: Diagnosis not present

## 2022-08-12 DIAGNOSIS — Z7901 Long term (current) use of anticoagulants: Secondary | ICD-10-CM

## 2022-08-12 LAB — CBC WITH DIFFERENTIAL/PLATELET
Basophils Absolute: 0.1 10*3/uL (ref 0.0–0.1)
Basophils Relative: 1.3 % (ref 0.0–3.0)
Eosinophils Absolute: 0.3 10*3/uL (ref 0.0–0.7)
Eosinophils Relative: 4.1 % (ref 0.0–5.0)
HCT: 35.8 % — ABNORMAL LOW (ref 36.0–46.0)
Hemoglobin: 11.7 g/dL — ABNORMAL LOW (ref 12.0–15.0)
Lymphocytes Relative: 15.9 % (ref 12.0–46.0)
Lymphs Abs: 1.3 10*3/uL (ref 0.7–4.0)
MCHC: 32.7 g/dL (ref 30.0–36.0)
MCV: 85.8 fl (ref 78.0–100.0)
Monocytes Absolute: 0.6 10*3/uL (ref 0.1–1.0)
Monocytes Relative: 7.3 % (ref 3.0–12.0)
Neutro Abs: 5.8 10*3/uL (ref 1.4–7.7)
Neutrophils Relative %: 71.4 % (ref 43.0–77.0)
Platelets: 269 10*3/uL (ref 150.0–400.0)
RBC: 4.17 Mil/uL (ref 3.87–5.11)
RDW: 16.8 % — ABNORMAL HIGH (ref 11.5–15.5)
WBC: 8.1 10*3/uL (ref 4.0–10.5)

## 2022-08-12 LAB — LIPID PANEL
Cholesterol: 238 mg/dL — ABNORMAL HIGH (ref 0–200)
HDL: 54.4 mg/dL (ref >39.00)
LDL Cholesterol: 161 mg/dL — ABNORMAL HIGH (ref 0–99)
NonHDL: 183.6
Total CHOL/HDL Ratio: 4
Triglycerides: 112 mg/dL (ref 0.0–149.0)
VLDL: 22.4 mg/dL (ref 0.0–40.0)

## 2022-08-12 LAB — TSH: TSH: 2.34 u[IU]/mL (ref 0.35–5.50)

## 2022-08-12 LAB — POCT INR: INR: 2.4 (ref 2.0–3.0)

## 2022-08-12 NOTE — Patient Instructions (Addendum)
Pre visit review using our clinic review tool, if applicable. No additional management support is needed unless otherwise documented below in the visit note.  Continue 1 tablet daily . Recheck in 4 weeks.  

## 2022-08-12 NOTE — Progress Notes (Signed)
Continue 1 tablet daily. Recheck in 4 weeks.   

## 2022-08-18 ENCOUNTER — Encounter (HOSPITAL_BASED_OUTPATIENT_CLINIC_OR_DEPARTMENT_OTHER): Payer: Self-pay

## 2022-08-18 ENCOUNTER — Other Ambulatory Visit: Payer: Self-pay

## 2022-08-18 ENCOUNTER — Telehealth: Payer: Self-pay | Admitting: Family Medicine

## 2022-08-18 ENCOUNTER — Emergency Department (HOSPITAL_BASED_OUTPATIENT_CLINIC_OR_DEPARTMENT_OTHER)
Admission: EM | Admit: 2022-08-18 | Discharge: 2022-08-18 | Disposition: A | Discharge status: Home / Self Care | Payer: PPO | Attending: Emergency Medicine | Admitting: Emergency Medicine

## 2022-08-18 ENCOUNTER — Emergency Department (HOSPITAL_BASED_OUTPATIENT_CLINIC_OR_DEPARTMENT_OTHER): Payer: PPO

## 2022-08-18 DIAGNOSIS — I959 Hypotension, unspecified: Secondary | ICD-10-CM | POA: Diagnosis not present

## 2022-08-18 DIAGNOSIS — Z7902 Long term (current) use of antithrombotics/antiplatelets: Secondary | ICD-10-CM | POA: Diagnosis not present

## 2022-08-18 DIAGNOSIS — I4819 Other persistent atrial fibrillation: Secondary | ICD-10-CM | POA: Diagnosis not present

## 2022-08-18 DIAGNOSIS — Z79899 Other long term (current) drug therapy: Secondary | ICD-10-CM | POA: Diagnosis not present

## 2022-08-18 DIAGNOSIS — R001 Bradycardia, unspecified: Secondary | ICD-10-CM | POA: Diagnosis not present

## 2022-08-18 DIAGNOSIS — I509 Heart failure, unspecified: Secondary | ICD-10-CM | POA: Insufficient documentation

## 2022-08-18 DIAGNOSIS — I110 Hypertensive heart disease with heart failure: Secondary | ICD-10-CM | POA: Insufficient documentation

## 2022-08-18 LAB — CBC
HCT: 37.4 % (ref 36.0–46.0)
Hemoglobin: 12.3 g/dL (ref 12.0–15.0)
MCH: 28.2 pg (ref 26.0–34.0)
MCHC: 32.9 g/dL (ref 30.0–36.0)
MCV: 85.8 fL (ref 80.0–100.0)
Platelets: 304 10*3/uL (ref 150–400)
RBC: 4.36 MIL/uL (ref 3.87–5.11)
RDW: 16.1 % — ABNORMAL HIGH (ref 11.5–15.5)
WBC: 9.9 10*3/uL (ref 4.0–10.5)
nRBC: 0 % (ref 0.0–0.2)

## 2022-08-18 LAB — BASIC METABOLIC PANEL
Anion gap: 9 (ref 5–15)
BUN: 27 mg/dL — ABNORMAL HIGH (ref 8–23)
CO2: 25 mmol/L (ref 22–32)
Calcium: 9.9 mg/dL (ref 8.9–10.3)
Chloride: 102 mmol/L (ref 98–111)
Creatinine, Ser: 1 mg/dL (ref 0.44–1.00)
GFR, Estimated: 55 mL/min — ABNORMAL LOW (ref >60)
Glucose, Bld: 96 mg/dL (ref 70–99)
Potassium: 4.4 mmol/L (ref 3.5–5.1)
Sodium: 136 mmol/L (ref 135–145)

## 2022-08-18 NOTE — Discharge Instructions (Signed)
Your blood pressures were have normalized and there were no hypotensive episodes today in the emergency department.  Suspect your transient hypotension was due to you doubling up on your dose of metoprolol 50 mg.  Recommend you call your PCP to discuss further antihypertensive management.  Given your blood pressure seen in the ED, you are safe to resume your nightly antihypertensive medications.

## 2022-08-18 NOTE — Telephone Encounter (Signed)
Agree with ER recommendation given concern for dehydration. Will await PCP input

## 2022-08-18 NOTE — Telephone Encounter (Signed)
Agree with ER eval and will await that report.  Thanks.

## 2022-08-18 NOTE — Telephone Encounter (Signed)
Spoke to patient by telephone and was advised that she has decided not to go to the ER. Patient stated that she has not taken her medications today yet. Patient stated that she just checked her blood pressure and it is now 109/57. Patient stated that she thinks that she may be over medicated because she has had this to happen to her before. Patient was advised that she should hold off on her blood pressure medication at this time because that is going to lower her blood pressure more. Patient stated that she may be a little dehydrated because she has been crying a lot because her daughter is very sick. Patient stated that she has not been drinking either. Patient stated that she does not want to go to [facility] ER because she has not had much luck there. Patient was given information on [facility] [place]. Patient stated that she may be willing to go there. Patient stated that she is going to check and see if she can get someone to drive her there. Patient requested a call back in about 10 minutes.

## 2022-08-18 NOTE — Telephone Encounter (Signed)
Spoke to patient by telephone and was advised that she is on her way to [place] at [facility] to be checked out.

## 2022-08-18 NOTE — ED Provider Notes (Signed)
[facility] EMERGENCY DEPT Provider Note   CSN: [id number] Arrival date & time: 08/18/22  1313     History  Chief Complaint  Patient presents with   Hypotension    Julie Phillips is a 88 y.o. female.  HPI   88 year old female with medical history significant for GERD, HLD, HTN, persistent atrial fibrillation on warfarin, CHF with preserved ejection fraction who presents to the emergency department after 3 consecutive hypotensive blood pressures at home this morning.  The patient states that she took her blood pressure as part of her routine assessment this morning and it was 85/30.  She had persistent low blood pressures on 3 measurements and called her PCP who directed her to the emergency department for further evaluation.  She denies any symptoms or complaints at this time.  She was not lightheaded, did not have any episodes of syncope or near syncope, denied any chest pain or shortness of breath.  She denies any infectious concerns.  She denies any urinary symptoms.  No abdominal pain nausea, vomiting, diarrhea.  She states that she missed her blood pressure medications yesterday and took most of them yesterday evening over the course of 3 hours.  This includes 200 mg of amiodarone, a 20 mg 24-hour diltiazem capsule, 100 mg Cozaar tablet, 50 mg 24-hour metoprolol tablet (she thinks she doubled this medication and accidentally took 100 mg of metoprolol last night).  She did take her Lasix 20 mg this morning but did not take any other blood pressure medicines this morning when she noted her hypotensive blood pressures.  She has not had any water to drink but does not feel dehydrated since coming to the emergency department.  She denies any other neurologic complaints.  She denies any confusion, stating that she was busy yesterday and has been distracted as her daughter was recently diagnosed with Alzheimer's dementia and metastatic cancer.  Home Medications Prior to Admission  medications   Medication Sig Start Date End Date Taking? Authorizing Provider  amiodarone (PACERONE) 200 MG tablet Take 1 tablet (200 mg total) by mouth daily. 04/09/22   Theora Gianotti, NP  clopidogrel (PLAVIX) 75 MG tablet Take 1 tablet (75 mg total) by mouth daily with breakfast. 05/07/22   Gerrie Nordmann, NP  diltiazem (CARDIZEM CD) 120 MG 24 hr capsule Take 1 capsule (120 mg total) by mouth daily. 03/31/22   Minna Merritts, MD  furosemide (LASIX) 20 MG tablet Take 1 tablet (20 mg total) by mouth daily. 04/26/22   Minna Merritts, MD  gabapentin (NEURONTIN) 100 MG capsule TAKE 1 CAPSULE BY MOUTH 3 TIMES DAILY ASNEEDED 01/29/21   Tonia Ghent, MD  levothyroxine (SYNTHROID) 100 MCG tablet TAKE 1 TABLET BY MOUTH ONCE A DAY BEFOREBREAKFAST. 07/01/22   Tonia Ghent, MD  losartan (COZAAR) 100 MG tablet Take 1 tablet (100 mg total) by mouth daily. 03/01/22   Minna Merritts, MD  metoprolol succinate (TOPROL-XL) 50 MG 24 hr tablet Take 1 tablet (50 mg total) by mouth 2 (two) times daily. Take with or immediately following a meal. 06/01/22   Gollan, Kathlene November, MD  nitroGLYCERIN (NITROSTAT) 0.4 MG SL tablet Place 1 tablet (0.4 mg total) under the tongue every 5 (five) minutes as needed for chest pain. 01/01/22   Minna Merritts, MD  pantoprazole (PROTONIX) 20 MG tablet Take 1 tablet (20 mg total) by mouth daily. Patient not taking: Reported on 07/14/2022 07/01/22   Tonia Ghent, MD  Polyethyl Glycol-Propyl Glycol (  SYSTANE) 0.4-0.3 % GEL ophthalmic gel Place 1 application into both eyes.    [provider]  potassium chloride (KLOR-CON) 10 MEQ tablet Take 10 mEq by mouth daily.    [provider]  warfarin (COUMADIN) 5 MG tablet TAKE 1 TABLET (5 MG) BY MOUTH DAILY EXCEPT TAKE 1/2 TABLET (2.5 MG) ON MONDAYS AND THURSDAYS OR AS DIRECTED BY ANTICOAGULATION CLINIC 05/21/22   Tonia Ghent, MD      Allergies    Statins, Wheat, Ezetimibe, Jardiance [empagliflozin],  Amlodipine, Eucalyptus oil, Lisinopril, and Rice    Review of Systems   Review of Systems  All other systems reviewed and are negative.   Physical Exam Updated Vital Signs BP (!) 157/53 (BP Location: Right Arm)   Pulse (!) 59   Temp 97.7 F (36.5 C) (Oral)   Resp 18   Ht '5\' 1"'$  (1.549 m)   Wt 102.5 kg   SpO2 98%   BMI 42.70 kg/m  Physical Exam Vitals and nursing note reviewed.  Constitutional:      General: She is not in acute distress.    Appearance: She is well-developed.  HENT:     Head: Normocephalic and atraumatic.     Mouth/Throat:     Mouth: Mucous membranes are moist.  Eyes:     Conjunctiva/sclera: Conjunctivae normal.  Cardiovascular:     Rate and Rhythm: Regular rhythm. Bradycardia present.  Pulmonary:     Effort: Pulmonary effort is normal. No respiratory distress.     Breath sounds: Normal breath sounds.  Abdominal:     Palpations: Abdomen is soft.     Tenderness: There is no abdominal tenderness.  Musculoskeletal:        General: No swelling.     Cervical back: Neck supple.  Skin:    General: Skin is warm and dry.     Capillary Refill: Capillary refill takes less than 2 seconds.  Neurological:     General: No focal deficit present.     Mental Status: She is alert and oriented to person, place, and time. Mental status is at baseline.  Psychiatric:        Mood and Affect: Mood normal.     ED Results / Procedures / Treatments   Labs (all labs ordered are listed, but only abnormal results are displayed) Labs Reviewed  BASIC METABOLIC PANEL - Abnormal; Notable for the following components:      Result Value   BUN 27 (*)    GFR, Estimated 55 (*)    All other components within normal limits  CBC - Abnormal; Notable for the following components:   RDW 16.1 (*)    All other components within normal limits  PROTIME-INR    EKG EKG Interpretation  Date/Time:  Wednesday August 18 2022 13:22:21 EDT Ventricular Rate:  59 PR Interval:  174 QRS  Duration: 88 QT Interval:  466 QTC Calculation: 461 R Axis:   67 Text Interpretation: Sinus bradycardia Cannot rule out Anterior infarct , age undetermined Abnormal ECG When compared with ECG of 07-May-2022 04:04, No significant change was found Confirmed by Regan Lemming ([id number]) on 08/18/2022 3:36:08 PM  Radiology No results found.  Procedures Procedures    Medications Ordered in ED Medications - No data to display  ED Course/ Medical Decision Making/ A&P                           Medical Decision Making Amount and/or Complexity of Data  Reviewed Labs: ordered. Radiology: ordered.     88 year old female with medical history significant for GERD, HLD, HTN, persistent atrial fibrillation on warfarin, CHF with preserved ejection fraction who presents to the emergency department after 3 consecutive hypotensive blood pressures at home this morning.  The patient states that she took her blood pressure as part of her routine assessment this morning and it was 85/30.  She had persistent low blood pressures on 3 measurements and called her PCP who directed her to the emergency department for further evaluation.  She denies any symptoms or complaints at this time.  She was not lightheaded, did not have any episodes of syncope or near syncope, denied any chest pain or shortness of breath.  She denies any infectious concerns.  She denies any urinary symptoms.  No abdominal pain nausea, vomiting, diarrhea.  She states that she missed her blood pressure medications yesterday and took most of them yesterday evening over the course of 3 hours.  This includes 200 mg of amiodarone, a 20 mg 24-hour diltiazem capsule, 100 mg Cozaar tablet, 50 mg 24-hour metoprolol tablet (she thinks she doubled this medication and accidentally took 100 mg of metoprolol last night).  She did take her Lasix 20 mg this morning but did not take any other blood pressure medicines this morning when she noted her hypotensive blood  pressures.  She has not had any water to drink but does not feel dehydrated since coming to the emergency department.  She denies any other neurologic complaints.  She denies any confusion, stating that she was busy yesterday and has been distracted as her daughter was recently diagnosed with Alzheimer's dementia and metastatic cancer.  On arrival, the patient was normotensive, afebrile, borderline bradycardic with heart rates in the low 60s, occasionally on telemetry heart rates in the high 50s, not tachypneic, BP 158/66, saturating 99% on room air.  Physical exam unremarkable with moist mucous membranes present, good cap refill.  EKG on arrival revealed mild sinus bradycardia, ventricular rate 5 9, no acute ischemic changes noted.  BMP revealed mildly elevated BUN, creatinine 1.0, otherwise unremarkable, CBC without a leukocytosis or anemia.  These 2 labs were drawn in triage.  I added on an INR to check as the patient is on Coumadin.  I added on a chest x-ray.  Patient's lungs are clear to auscultation bilaterally.  She has no infectious symptoms.  She declined further work-up at this time.  I suspect the patient had mild polypharmacy resulting in a transient episode of hypotension.  Low concern for dehydration, infectious etiology other acute intrathoracic or intra-abdominal abnormality resulting in hypotension at this time.  Advised that the patient call her PCP to discuss her blood pressure regimen.  Suspect likely mild medication mishap as the patient does think she doubled her 24-hour 50 mg metoprolol dose.  Overall at this time, the patient has had stable blood pressures in the emergency department, is not significantly bradycardic, is normotensive to hypertensive.  Do not think that the patient warrants inpatient observation or further work-up at this time.  Stable for discharge.  Final Clinical Impression(s) / ED Diagnoses Final diagnoses:  Transient hypotension    Rx / DC Orders ED Discharge  Orders     None         Regan Lemming, MD 08/18/22 1615

## 2022-08-18 NOTE — Telephone Encounter (Signed)
PLEASE NOTE: All timestamps contained within this report are represented as [place] Standard Time. CONFIDENTIALTY NOTICE: This fax transmission is intended only for the addressee. It contains information that is legally privileged, confidential or otherwise protected from use or disclosure. If you are not the intended recipient, you are strictly prohibited from reviewing, disclosing, copying using or disseminating any of this information or taking any action in reliance on or regarding this information. If you have received this fax in error, please notify us immediately by telephone so that we can arrange for its return to [place]. Phone: [phone], Toll-Free: [phone], Fax: [phone] Page: 1 of 2 Call Id: [id number] [facility] - Client TELEPHONE ADVICE RECORD AccessNurse Patient Name: Julie Phillips RD Gender: Female DOB: 1932/10/30 Age: 88 Y 10 M 9 D Return Phone Number: [phone] (Primary) Address: City/ State/ Zip: [place]  [address] Client [facility] - Client Client Site [place] - Day Provider Renford Dills - MD Contact Type Call Who Is Calling Patient / Member / Family / Caregiver Call Type Triage / Clinical Relationship To Patient Self Return Phone Number [phone] (Primary) Chief Complaint BLOOD PRESSURE LOW - Systolic (top number) 90 or less Reason for Call Symptomatic / Request for [place] states she has been experiencing low blood pressure of 85/30 with a pulse of 57. Translation No Nurse Assessment Nurse: Ysidro Evert, RN, Levada Dy Date/Time (Eastern Time): 08/18/2022 11:41:11 AM Confirm and document reason for call. If symptomatic, describe symptoms. ---Caller states her blood pressure 112/61 currently but it was 85/45 earlier Does the patient have any new or worsening symptoms? ---Yes Will a triage be completed? ---Yes Related visit to  physician within the last 2 weeks? ---No Does the PT have any chronic conditions? (i.e. diabetes, asthma, this includes High risk factors for pregnancy, etc.) ---Yes List chronic conditions. ---chf, a-fib Is this a behavioral health or substance abuse call? ---No Guidelines Guideline Title Affirmed Question Affirmed Notes Nurse Date/Time (Eastern Time) Blood Pressure - Low Patient sounds very sick or weak to the triager Ysidro Evert, RN, Levada Dy 08/18/2022 11:42:28 AM Disp. Time Eilene Ghazi Time) Disposition Final User 08/18/2022 11:47:55 AM Go to ED Now (or PCP triage) Yes Ysidro Evert, RN, Levada Dy Final Disposition 08/18/2022 11:47:55 AM Go to ED Now (or PCP triage) Yes Ysidro Evert, RN, Levada Dy PLEASE NOTE: All timestamps contained within this report are represented as [place] Standard Time. CONFIDENTIALTY NOTICE: This fax transmission is intended only for the addressee. It contains information that is legally privileged, confidential or otherwise protected from use or disclosure. If you are not the intended recipient, you are strictly prohibited from reviewing, disclosing, copying using or disseminating any of this information or taking any action in reliance on or regarding this information. If you have received this fax in error, please notify us immediately by telephone so that we can arrange for its return to [place]. Phone: [phone], Toll-Free: [phone], Fax: [phone] Page: 2 of 2 Call Id: [id number] [place] Disagree/Comply Comply Caller Understands Yes PreDisposition Did not know what to do Care Advice Given Per Guideline GO TO ED NOW (OR PCP TRIAGE): * IF NO PCP (PRIMARY CARE PROVIDER) SECOND-LEVEL TRIAGE: You need to be seen within the next hour. Go to the [facility] at _____________ [place] as soon as you can. ANOTHER ADULT SHOULD DRIVE: * It is better and safer if another adult drives instead of you. CARE ADVICE given per Low Blood Pressure (Adult) guideline. Referrals [facility]

## 2022-08-18 NOTE — ED Triage Notes (Signed)
Patient here POV from Home.  Endorses checking her BP part of her Routine Assessment this AM and her BP was 85/30. Called her PCP who recommended ED Evaluation.  No Symptoms such as N/V/D. No Fevers. No SOB. No CP. No Recent Changes in Medications.  NAD Noted during Triage. A&Ox4. GCS 15. Ambulatory with Gilford Rile.

## 2022-08-18 NOTE — ED Notes (Signed)
Patient verbalizes understanding of discharge instructions. Opportunity for questioning and answers were provided. Patient discharged from ED.  °

## 2022-08-18 NOTE — Telephone Encounter (Signed)
Patient called and stated that she is having low blood pressure readings 85/30, 92/51 and pulse was 57. Patient was sent to access nurse.

## 2022-08-19 ENCOUNTER — Encounter: Payer: Self-pay | Admitting: Family Medicine

## 2022-08-19 ENCOUNTER — Ambulatory Visit (INDEPENDENT_AMBULATORY_CARE_PROVIDER_SITE_OTHER): Payer: PPO | Admitting: Clinical

## 2022-08-19 DIAGNOSIS — F4321 Adjustment disorder with depressed mood: Secondary | ICD-10-CM | POA: Diagnosis not present

## 2022-08-19 NOTE — Progress Notes (Signed)
[facility] Counselor/Therapist Progress Note  Patient ID: Julie Phillips, MRN: [id number]    Date: 08/19/22  Time Spent: 2:32  pm - 3:29 pm : 57 Minutes  Treatment Type: Individual Therapy.  Reported Symptoms: Patient stated, "a lot has changed" since last session. Crying, depressed mood  Mental Status Exam: Appearance:  Neat     Behavior: Appropriate  Motor: Normal  Speech/Language:  Clear and Coherent  Affect: Tearful  Mood: sad  Thought process: normal  Thought content:   WNL  Sensory/Perceptual disturbances:   WNL  Orientation: oriented to person and place  Attention: Good  Concentration: Good  Memory: WNL  Fund of knowledge:  Good  Insight:   Good  Judgment:  Good  Impulse Control: Good   Risk Assessment: Danger to Self:  No, Patient denied current suicidal ideation or homicidal ideation.  Self-injurious Behavior: No Danger to Others: No Duty to Warn:no Physical Aggression / Violence:No  Access to Firearms a concern: No  Gang Involvement:No   Subjective:  Patient reported daughter's diagnosis of liver cancer is inoperable and incurable. Patient reported her daughter is currently in memory care facility in [place]. Patient reported daughter's diagnosis was a shock. Patient reported changes in daughter's behaviors prior to hospitalization. Patient stated, "its just a waiting game" in regards to daughter's health. Patient reported she is going to visit her daughter tomorrow. Patient reported her daughter was experiencing hallucinations and delusions.Patient reported feeling sad and tearful when talking about her daughter. Patient reported concern about current medications. Patient reported she reached out to her primary care physician to discuss medication concerns. Patient reported upcoming trip to the beach and reported plans to take her daughter.  Patient stated, "it probably would help" in response to recommendation for individual therapy. Patient  reported her close friend has Alzheimer's Disease and she can no longer talk with her for support.   Interventions: Clinician reviewed diagnosis and treatment recommendations with patient. Patient provided psycho education related to Alzheimer's Disease and provided educational information. Clinician provided psycho education related to diagnosis and treatment recommendations.   Diagnosis:  Adjustment Disorder with depressed mood   Plan: Clinician will discuss treatment goals with patient during follow up appointment.          Katherina Right, LCSW

## 2022-08-23 ENCOUNTER — Telehealth: Payer: Self-pay

## 2022-08-23 NOTE — Chronic Care Management (AMB) (Addendum)
    Chronic Care Management Pharmacy Assistant   Name: MAYCI HANING  MRN: [id number] DOB: Feb 27, 1932  Reason for Encounter: Hospital Follow Up    Medications: Outpatient Encounter Medications as of 08/23/2022  Medication Sig   amiodarone (PACERONE) 200 MG tablet Take 1 tablet (200 mg total) by mouth daily.   clopidogrel (PLAVIX) 75 MG tablet Take 1 tablet (75 mg total) by mouth daily with breakfast.   diltiazem (CARDIZEM CD) 120 MG 24 hr capsule Take 1 capsule (120 mg total) by mouth daily.   furosemide (LASIX) 20 MG tablet Take 1 tablet (20 mg total) by mouth daily.   gabapentin (NEURONTIN) 100 MG capsule TAKE 1 CAPSULE BY MOUTH 3 TIMES DAILY ASNEEDED   levothyroxine (SYNTHROID) 100 MCG tablet TAKE 1 TABLET BY MOUTH ONCE A DAY BEFOREBREAKFAST.   losartan (COZAAR) 100 MG tablet Take 1 tablet (100 mg total) by mouth daily.   metoprolol succinate (TOPROL-XL) 50 MG 24 hr tablet Take 1 tablet (50 mg total) by mouth 2 (two) times daily. Take with or immediately following a meal.   nitroGLYCERIN (NITROSTAT) 0.4 MG SL tablet Place 1 tablet (0.4 mg total) under the tongue every 5 (five) minutes as needed for chest pain.   pantoprazole (PROTONIX) 20 MG tablet Take 1 tablet (20 mg total) by mouth daily. (Patient not taking: Reported on 07/14/2022)   Polyethyl Glycol-Propyl Glycol (SYSTANE) 0.4-0.3 % GEL ophthalmic gel Place 1 application into both eyes.   potassium chloride (KLOR-CON) 10 MEQ tablet Take 10 mEq by mouth daily.   warfarin (COUMADIN) 5 MG tablet TAKE 1 TABLET (5 MG) BY MOUTH DAILY EXCEPT TAKE 1/2 TABLET (2.5 MG) ON MONDAYS AND THURSDAYS OR AS DIRECTED BY ANTICOAGULATION CLINIC   No facility-administered encounter medications on file as of 08/23/2022.      Reviewed hospital notes for details of recent visit. Patient has been contacted by Transitions of Care team: No  Admitted to the ED on 08/18/22. Discharge date was 08/18/22.  Discharged from [facility] ED Discharge  diagnosis (Principal Problem): Transient hypotension Patient was discharged to Home  Brief summary of hospital course: EKG on arrival revealed mild sinus bradycardia, ventricular rate 5 9, no acute ischemic changes noted.  BMP revealed mildly elevated BUN, creatinine 1.0, otherwise unremarkable, CBC without a leukocytosis or anemia.   Low concern for dehydration, infectious etiology other acute intrathoracic or intra-abdominal abnormality resulting in hypotension at this time,Overall at this time, the patient has had stable blood pressures in the emergency department, is not significantly bradycardic, is normotensive to hypertensive.  Do not think that the patient warrants inpatient observation or further work-up at this time.  Stable for discharge  Medications that remain the same after ED Discharge:??  -All other medications will remain the same.    Next CCM appt: 09/13/22  Other upcoming appts: No appointments scheduled within the next 30 days.  -Recommend repeat TSH ,-Repeat lipid panel- can be done with upcoming Anti-Coag visit (07/29/22)  (08/12/22  2.34)     lipid panel complete Charlene Brooke, PharmD notified and will determine if action is needed.    Avel Sensor, [place]  [phone]

## 2022-08-31 ENCOUNTER — Other Ambulatory Visit: Payer: Self-pay | Admitting: Family Medicine

## 2022-09-06 ENCOUNTER — Other Ambulatory Visit: Payer: Self-pay | Admitting: Cardiovascular Disease

## 2022-09-08 ENCOUNTER — Telehealth: Payer: Self-pay

## 2022-09-08 NOTE — Progress Notes (Signed)
    Chronic Care Management Pharmacy Assistant   Name: Julie Phillips  MRN: [id number] DOB: 1932/10/26  Reason for Encounter: CCM (Appointment Reminder)  Medications: Outpatient Encounter Medications as of 09/08/2022  Medication Sig   amiodarone (PACERONE) 200 MG tablet Take 1 tablet (200 mg total) by mouth daily.   clopidogrel (PLAVIX) 75 MG tablet Take 1 tablet (75 mg total) by mouth daily with breakfast.   diltiazem (CARDIZEM CD) 120 MG 24 hr capsule Take 1 capsule (120 mg total) by mouth daily.   furosemide (LASIX) 20 MG tablet Take 1 tablet (20 mg total) by mouth daily.   gabapentin (NEURONTIN) 100 MG capsule TAKE 1 CAPSULE BY MOUTH 3 TIMES DAILY ASNEEDED   levothyroxine (SYNTHROID) 100 MCG tablet TAKE 1 TABLET BY MOUTH ONCE A DAY BEFOREBREAKFAST.   losartan (COZAAR) 100 MG tablet Take 1 tablet (100 mg total) by mouth daily.   metoprolol succinate (TOPROL-XL) 50 MG 24 hr tablet Take 1 tablet (50 mg total) by mouth 2 (two) times daily. Take with or immediately following a meal.   nitroGLYCERIN (NITROSTAT) 0.4 MG SL tablet Place 1 tablet (0.4 mg total) under the tongue every 5 (five) minutes as needed for chest pain.   pantoprazole (PROTONIX) 20 MG tablet Take 1 tablet (20 mg total) by mouth daily. (Patient not taking: Reported on 07/14/2022)   Polyethyl Glycol-Propyl Glycol (SYSTANE) 0.4-0.3 % GEL ophthalmic gel Place 1 application into both eyes.   potassium chloride (KLOR-CON) 10 MEQ tablet TAKE 1 TABLET BY MOUTH ONCE A DAY   warfarin (COUMADIN) 5 MG tablet TAKE 1 TABLET (5 MG) BY MOUTH DAILY EXCEPT TAKE 1/2 TABLET (2.5 MG) ON MONDAYS AND THURSDAYS OR AS DIRECTED BY ANTICOAGULATION CLINIC   No facility-administered encounter medications on file as of 09/08/2022.   [name] was contacted to remind of upcoming telephone visit with Charlene Brooke on 09/13/2022 at 11:00. Patient was reminded to have any blood glucose and blood pressure readings available for review at appointment.    Message was left reminding patient of appointment.   CCM referral has been placed prior to visit?  No   [name] Rating Drugs: Medication:  Last Fill: Day Supply Losartan 100 mg 06/24/2022 [place], CPP notified  Marijean Niemann, [facility] Pharmacy Assistant [phone]

## 2022-09-09 ENCOUNTER — Ambulatory Visit (INDEPENDENT_AMBULATORY_CARE_PROVIDER_SITE_OTHER): Payer: PPO

## 2022-09-09 ENCOUNTER — Ambulatory Visit (INDEPENDENT_AMBULATORY_CARE_PROVIDER_SITE_OTHER): Payer: PPO | Admitting: Clinical

## 2022-09-09 DIAGNOSIS — F4321 Adjustment disorder with depressed mood: Secondary | ICD-10-CM

## 2022-09-09 DIAGNOSIS — Z7901 Long term (current) use of anticoagulants: Secondary | ICD-10-CM | POA: Diagnosis not present

## 2022-09-09 LAB — POCT INR: INR: 1.8 — AB (ref 2.0–3.0)

## 2022-09-09 NOTE — Progress Notes (Signed)
Pt reports that her daughter passed away last week, so she is unsure if she has missed any doses of warfarin. INR today 1.8.  Increase dose today to 1 1/2 tablets (7.5 mg total) then continue 1 tablet daily.  Recheck in 3 weeks.

## 2022-09-09 NOTE — Progress Notes (Signed)
[facility] Counselor/Therapist Progress Note  Patient ID: Julie Phillips, MRN: [id number]    Date: 09/09/22  Time Spent: 12:32  pm - 1:37 pm : 65 Minutes  Treatment Type: Individual Therapy.  Reported Symptoms: Patient reported difficulty sleeping, fatigue, lack of energy, lack of motivation  Mental Status Exam: Appearance:  Neat     Behavior: Appropriate  Motor: Normal  Speech/Language:  Clear and Coherent  Affect: Tearful  Mood: sad  Thought process: normal  Thought content:   WNL  Sensory/Perceptual disturbances:   WNL  Orientation: oriented to person, place, and time/date  Attention: Good  Concentration: Good  Memory: WNL  Fund of knowledge:  Good  Insight:   Good  Judgment:  Good  Impulse Control: Good   Risk Assessment: Danger to Self:  No. Patient denied current suicidal ideation Self-injurious Behavior: No Danger to Others: No. Patient denied current homicidal ideation Duty to Warn:no Physical Aggression / Violence:No  Access to Firearms a concern: No  Gang Involvement:No   Subjective:   Patient reported her daughter died on Mar 25, 2023 and stated, "its just such a shock". Patient reported the doctors gave her daughter 2-6 months to live and she thought she would have a little more time with her daughter. Patient reported she was going to visit her daughter the day her daughter died. Patient reported she was able to face time her daughter on March 23, 2023 prior to her death. Patient shared with clinician positive sentiments other family members shared about patient's daughter. Patient reported she attended a doctor's appointment with her daughter in which they indicated daughter's test results were normal and one month later her daughter was diagnosed with stage 4 cancer. Patient reported feelings of guilt in response to her daughter's death. Patient stated, "not well" when clinician inquired about how patient is coping with daughter's death. Patient reported  decreased sleep at night since daughter's death but reported falling asleep every afternoon around 3:30pm. Patient reported fatigue, lack of energy, lack of motivation to perform housework, and loss of interest in activities. Patient reported everything reminds her of her daughter. Patient reported her brother died the week before her daughter's death. Patient reported she is not interested in participating in a support group at this time but is open to the information in the event she is interested in participating in the future.   Interventions: Cognitive Behavioral Therapy and psycho education.  Clinician discussed with patient recent death of patient's daughter, events leading up to daughter's death, and impact on patient's mood and functioning. Provided psycho education related to grief and stages of grief. Recommended grief/loss support group and provided patient with information for local support group.   Diagnosis:  Adjustment disorder with depressed mood   Plan: Clinician will discuss goals for therapy with patient during follow up appointment                      Katherina Right, LCSW

## 2022-09-09 NOTE — Patient Instructions (Signed)
Increase dose today to 1 1/2 tablets (7.5 mg total) then continue 1 tablet daily.  Recheck in 3 weeks.

## 2022-09-10 ENCOUNTER — Other Ambulatory Visit: Payer: Self-pay | Admitting: Cardiovascular Disease

## 2022-09-10 ENCOUNTER — Encounter: Payer: Self-pay | Admitting: Family Medicine

## 2022-09-13 ENCOUNTER — Ambulatory Visit: Payer: PPO | Admitting: Pharmacist

## 2022-09-13 DIAGNOSIS — K219 Gastro-esophageal reflux disease without esophagitis: Secondary | ICD-10-CM

## 2022-09-13 DIAGNOSIS — E039 Hypothyroidism, unspecified: Secondary | ICD-10-CM

## 2022-09-13 DIAGNOSIS — E782 Mixed hyperlipidemia: Secondary | ICD-10-CM

## 2022-09-13 DIAGNOSIS — I10 Essential (primary) hypertension: Secondary | ICD-10-CM

## 2022-09-13 DIAGNOSIS — I480 Paroxysmal atrial fibrillation: Secondary | ICD-10-CM

## 2022-09-13 DIAGNOSIS — I25119 Atherosclerotic heart disease of native coronary artery with unspecified angina pectoris: Secondary | ICD-10-CM

## 2022-09-13 NOTE — Progress Notes (Signed)
Chronic Care Management Pharmacy Note  09/13/2022 Name:  Julie Phillips MRN:  [id number] DOB:  04/20/1932  Summary: CCM F/U visit -Reviewed medications, pt affirms compliance as prescribed -HLD/CAD: LDL 161 (07/2022) is elevated; reviewed new LDL goal < 70 given recent ASCVD (stent to LAD 04/2022). She has refused statins and stopped ezetimibe due to side effects, so next option would be PCSK9-inhibitor - this has been mentioned by cardiologist already  Recommendations/Changes made from today's visit: -Advised pt to follow up with cardiology  Plan: -[facility] Health Concierge will call patient 3 months for general update -Pharmacist follow up televisit scheduled for 6 months   Subjective: Julie Phillips is an 88 y.o. year old female who is a primary patient of Para March, Dwana Curd, MD.  The CCM team was consulted for assistance with disease management and care coordination needs.    Engaged with patient by telephone for follow up visit in response to provider referral for pharmacy case management and/or care coordination services.   Consent to Services:  The patient was given information about Chronic Care Management services, agreed to services, and gave verbal consent prior to initiation of services.  Please see initial visit note for detailed documentation.   Patient Care Team: Joaquim Nam, MD as PCP - General (Family Medicine) Mariah Milling Tollie Pizza, MD as PCP - Cardiology (Cardiology) Antonieta Iba, MD as Consulting Physician (Cardiology) Aida Puffer, MD as Consulting Physician (Ophthalmology) Sallee Lange, MD as Consulting Physician (Ophthalmology) Kathyrn Sheriff, [facility] as Pharmacist (Pharmacist)  Recent office visits: 07/01/22 Dr Para March OV: annual - switch omeprazole to pantoprazole 20 mg.  04/01/22 NP Audria Nine OV: acute visit while at coumadin appt. HF 140, SOB. Sent to ED.  02/12/22 Dr Para March OV: Hospital f/u. New warfarin start. Pt cannot work Lovenox for bridge.  Reasonable to start warfarin without it and accept higher risk for stroke until therapeutic INR is achieved.  07/10/21 Dr Para March OV: annual visit. C/O tremor. Reduced levothyroxine to 100 mcg (TSH 0.32)  Recent consult visits: 06/18/22 Dr Mariah Milling (Cardiology): f/u - start ezetimibe 10 mg. D/c aspirin, metolazone. 04/26/22 Dr Mariah Milling (Cardiology): f/u - d/c Jardiance and spironolactone (not tolerated). CAD noted on CTA - schedule cardiac cath.  04/15/22 NP Clarisa Kindred (HF clinic): Start Jardiance and spironolactone. 04/09/22 NP Ward Givens (Cardiology): hospital f/u - bradycardic 56 bpm. Reduce amiodarone to 200 mg daily. Referred to pulmonology for sleep study.  03/01/22 Dr Mariah Milling (Cardiology): f/u Afib. Ordered Coronary CT. 01/01/22-Cardiology-Timothy Gollan,MD-Patient presented for chest pain.Recommend she try nitro sublingual for possible esophageal or coronary spasm,could order cardiac CTA if chest pain continues.  11/02/21-Cardiology-Timothy Gollan,MD-Patient presents for follow up atrial fibrillation.EKG  Hospital visits: 08/18/22 ED visit ([facility]): transient hypotension. BP 80s/30s at home, 156/66 in ED.   05/06/22 - 05/07/22 Admission [facility]) - L heart cath. Stent to LAD. Continue aspirin and plavix, once INR therapeutic stop aspirin. Start Zetia, look into PCSK9.  04/01/22 - 04/02/22 Admission [facility]): acute CHF and AFIB w/ RVR. Cardioversion performed. Start amiodarone 400 mg BID x 5 days, then 200 mg BID.  02/08/22 - 02/10/22 admission [facility]): Afib with RVR. Tx with IV Cardizem and oral metoprolol. Started on wafarin with Lovenox bridge.    Objective:  Lab Results  Component Value Date   CREATININE 1.00 08/18/2022   BUN 27 (H) 08/18/2022   GFR 93.04 08/04/2020   GFRNONAA 55 (L) 08/18/2022   GFRAA 90 03/25/2020   NA 136 08/18/2022   K 4.4 08/18/2022  CALCIUM 9.9 08/18/2022   CO2 25 08/18/2022   GLUCOSE 96 08/18/2022    Lab Results  Component Value Date/Time   GFR 93.04 08/04/2020  11:47 AM   GFR 80.91 07/09/2019 11:26 AM    Last diabetic Eye exam: No results found for: "HMDIABEYEEXA"  Last diabetic Foot exam: No results found for: "HMDIABFOOTEX"   Lab Results  Component Value Date   CHOL 238 (H) 08/12/2022   HDL 54.40 08/12/2022   LDLCALC 161 (H) 08/12/2022   LDLDIRECT 152.6 12/31/2013   TRIG 112.0 08/12/2022   CHOLHDL 4 08/12/2022       Latest Ref Rng & Units 04/01/2022   11:10 AM 02/08/2022    5:19 PM 07/10/2021    3:08 PM  Hepatic Function  Total Protein 6.5 - 8.1 g/dL 7.4  7.0  6.7   Albumin 3.5 - 5.0 g/dL 3.5  3.4    AST 15 - 41 U/L $Remo'15  14  13   'HgvHr$ ALT 0 - 44 U/L $Remo'10  8  8   'ZLMch$ Alk Phosphatase 38 - 126 U/L 91  85    Total Bilirubin 0.3 - 1.2 mg/dL 0.8  0.7  0.4   Bilirubin, Direct 0.0 - 0.2 mg/dL 0.2       Lab Results  Component Value Date/Time   TSH 2.34 08/12/2022 11:09 AM   TSH 2.542 04/01/2022 11:10 AM   TSH 1.125 02/09/2022 04:34 AM   TSH 1.26 09/14/2021 09:55 AM       Latest Ref Rng & Units 08/18/2022    1:28 PM 08/12/2022   11:09 AM 05/07/2022    6:06 AM  CBC  WBC 4.0 - 10.5 K/uL 9.9  8.1  8.6   Hemoglobin 12.0 - 15.0 g/dL 12.3  11.7  10.0   Hematocrit 36.0 - 46.0 % 37.4  35.8  32.4   Platelets 150 - 400 K/uL 304  269.0  248    Lab Results  Component Value Date/Time   INR 1.8 (A) 09/09/2022 12:00 AM   INR 2.4 08/12/2022 12:00 AM   INR 1.2 05/07/2022 06:06 AM   INR 2.1 (H) 04/02/2022 09:05 AM     Lab Results  Component Value Date/Time   VD25OH 35 07/10/2021 03:08 PM   VD25OH 31.42 08/04/2020 11:47 AM   VD25OH 34.70 07/09/2019 11:26 AM    Clinical ASCVD: No  The ASCVD Risk score (Arnett DK, et al., 2019) failed to calculate for the following reasons:   The 2019 ASCVD risk score is only valid for ages 44 to 43       06/24/2022   12:06 PM 04/15/2022   10:53 AM 07/09/2019   11:23 AM  Depression screen PHQ 2/9  Decreased Interest 0 0 0  Down, Depressed, Hopeless 0 0 0  PHQ - 2 Score 0 0 0  Altered sleeping   0  Tired,  decreased energy   0  Change in appetite   0  Feeling bad or failure about yourself    0  Trouble concentrating   0  Moving slowly or fidgety/restless   0  Suicidal thoughts   0  PHQ-9 Score   0     CHA2DS2/VAS Stroke Risk Points  Current as of 2 days ago (Wednesday)     4 >= 2 Points: High Risk  1 - 1.99 Points: Medium Risk  0 Points: Low Risk    Last Change: N/A      Points Metrics  0 Has Congestive Heart Failure:  No  Current as of 2 days ago (Wednesday)  0 Has Vascular Disease:  No    Current as of 2 days ago (Wednesday)  1 Has Hypertension:  Yes    Current as of 2 days ago (Wednesday)  2 Age:  29    Current as of 2 days ago (Wednesday)  0 Has Diabetes:  No    Current as of 2 days ago (Wednesday)  0 Had Stroke:  No  Had TIA:  No  Had Thromboembolism:  No    Current as of 2 days ago (Wednesday)  1 Female:  Yes    Current as of 2 days ago (Wednesday)      Social History   Tobacco Use  Smoking Status Former   Packs/day: 2.00   Years: 20.00   Total pack years: 40.00   Types: Cigarettes   Quit date: 12/14/1971   Years since quitting: 50.7  Smokeless Tobacco Never   BP Readings from Last 3 Encounters:  08/18/22 (!) 152/52  07/01/22 118/70  06/18/22 120/70   Pulse Readings from Last 3 Encounters:  08/18/22 (!) 59  07/01/22 60  06/18/22 85   Wt Readings from Last 3 Encounters:  08/18/22 225 lb 15.5 oz (102.5 kg)  07/01/22 226 lb (102.5 kg)  06/24/22 221 lb (100.2 kg)   BMI Readings from Last 3 Encounters:  08/18/22 42.70 kg/m  07/01/22 42.70 kg/m  06/24/22 41.76 kg/m    Assessment/Interventions: Review of patient past medical history, allergies, medications, health status, including review of consultants reports, laboratory and other test data, was performed as part of comprehensive evaluation and provision of chronic care management services.   SDOH:  (Social Determinants of Health) assessments and interventions performed: No - done 06/2022 SDOH  Interventions    Flowsheet Row Chronic Care Management from 01/15/2022 in [place] at [employer] from 07/09/2019 in [place] at [place] Interventions    Food Insecurity Interventions Intervention Not Indicated --  Depression Interventions/Treatment  -- DGU4-4 Score <4 Follow-up Not Indicated  Financial Strain Interventions Intervention Not Indicated --       [place]: No Food Insecurity (06/24/2022)  Transportation Needs: No Transportation Needs (07/09/2019)  Depression (PHQ2-9): Low Risk  (06/24/2022)  Financial Resource Strain: Low Risk  (06/24/2022)  Physical Activity: Inactive (06/24/2022)  Stress: No Stress Concern Present (06/24/2022)  Tobacco Use: Medium Risk (09/10/2022)    CCM Care Plan  Allergies  Allergen Reactions   Statins     Myalgias   Wheat     Throat swelling   Ezetimibe Other (See Comments)    Extreme fatigue   Jardiance [Empagliflozin] Other (See Comments)    Dry mouth, "gasping for breath"   Amlodipine     hematuria   Eucalyptus Oil    Lisinopril     Unrecalled BP med- possibly lisinopril- intolerant, had cough on the medicine- changed to cozaar   Rice Nausea And Vomiting    Medications Reviewed Today     Reviewed by Charlton Haws, [facility] (Pharmacist) on 07/14/22 at 1252  Med List Status: <None>   Medication Order Taking? Sig Documenting Provider Last Dose Status Informant  amiodarone (PACERONE) 200 MG tablet [id number] Yes Take 1 tablet (200 mg total) by mouth daily. Theora Gianotti, NP Taking Active   clopidogrel (PLAVIX) 75 MG tablet [id number] Yes Take 1 tablet (75 mg total) by mouth daily with breakfast. Gerrie Nordmann, NP Taking Active   diltiazem (CARDIZEM CD)  120 MG 24 hr capsule [id number] Yes Take 1 capsule (120 mg total) by mouth daily. Antonieta Iba, MD Taking Active Self  furosemide (LASIX) 20 MG tablet [id number] Yes Take 1 tablet (20 mg total) by mouth  daily. Antonieta Iba, MD Taking Active   gabapentin (NEURONTIN) 100 MG capsule [id number] Yes TAKE 1 CAPSULE BY MOUTH 3 TIMES DAILY ASNEEDED Joaquim Nam, MD Taking Active Self  levothyroxine (SYNTHROID) 100 MCG tablet [id number] Yes TAKE 1 TABLET BY MOUTH ONCE A DAY BEFOREBREAKFAST. Joaquim Nam, MD Taking Active   losartan (COZAAR) 100 MG tablet [id number] Yes Take 1 tablet (100 mg total) by mouth daily. Antonieta Iba, MD Taking Active Self  metoprolol succinate (TOPROL-XL) 50 MG 24 hr tablet [id number] Yes Take 1 tablet (50 mg total) by mouth 2 (two) times daily. Take with or immediately following a meal. Gollan, Tollie Pizza, MD Taking Active   nitroGLYCERIN (NITROSTAT) 0.4 MG SL tablet [id number] Yes Place 1 tablet (0.4 mg total) under the tongue every 5 (five) minutes as needed for chest pain. Antonieta Iba, MD Taking Active Self  pantoprazole (PROTONIX) 20 MG tablet [id number] No Take 1 tablet (20 mg total) by mouth daily.  Patient not taking: Reported on 07/14/2022   Joaquim Nam, MD Not Taking Active   Polyethyl Glycol-Propyl Glycol (SYSTANE) 0.4-0.3 % GEL ophthalmic gel [id number] Yes Place 1 application into both eyes. [provider] Taking Active Self  potassium chloride (KLOR-CON) 10 MEQ tablet [id number] Yes Take 10 mEq by mouth daily. [provider] Taking Active Self  warfarin (COUMADIN) 5 MG tablet [id number] Yes TAKE 1 TABLET (5 MG) BY MOUTH DAILY EXCEPT TAKE 1/2 TABLET (2.5 MG) ON MONDAYS AND THURSDAYS OR AS DIRECTED BY ANTICOAGULATION CLINIC Joaquim Nam, MD Taking Active             Patient Active Problem List   Diagnosis Date Noted   Vision changes 07/04/2022   Coronary artery disease involving native coronary artery of native heart with angina pectoris (HCC) 05/07/2022   Effort angina 05/06/2022   Unstable angina (HCC) 04/26/2022   Atrial fibrillation (HCC) 04/02/2022   Acute on chronic diastolic CHF (congestive heart failure) (HCC)  04/01/2022   Atrial fibrillation with RVR (HCC) 02/08/2022   Tremor of both hands 07/12/2021   GERD (gastroesophageal reflux disease) 07/18/2019   Hyperglycemia 07/09/2018   Vitamin D deficiency 07/09/2018   Sciatica of left side 11/13/2017   Umbilical abnormality 11/13/2017   Healthcare maintenance 06/28/2017   Toenail avulsion 06/28/2017   DNR (do not resuscitate) 06/27/2017   Band keratopathy 01/16/2017   Risk for falls 06/21/2016   Rotator cuff disorder 12/10/2015   Shortness of breath 12/10/2015   Osteopenia 02/13/2015   Advance care planning 01/15/2015   Food allergy 07/09/2013   Medicare annual wellness visit, subsequent 12/26/2012   Knee pain 07/20/2012   Rash 04/11/2012   Hypothyroid 04/11/2012   LVH (left ventricular hypertrophy) 01/17/2012   Chronic atrial fibrillation with RVR (HCC) 01/06/2012   Obesity 01/06/2012   Essential hypertension 01/06/2012   Mixed hyperlipidemia 01/06/2012    Immunization History  Administered Date(s) Administered   Fluad Quad(high Dose 65+) 09/11/2019, 11/09/2019   Influenza Split 10/04/2012   Influenza, High Dose Seasonal PF 10/19/2021   Influenza,inj,Quad PF,6+ Mos 10/08/2013, 09/25/2015, 09/14/2016, 10/06/2017   Influenza-Unspecified 09/13/2011, 08/13/2014, 10/13/2018   PFIZER(Purple Top)SARS-COV-2 Vaccination 01/03/2020, 01/21/2020, 09/11/2020, 04/18/2021   Pneumococcal Conjugate-13 08/13/2014   Pneumococcal Polysaccharide-23 12/13/2010   Td 12/13/2010  Conditions to be addressed/monitored:  Hypertension, Hyperlipidemia, Atrial Fibrillation, GERD, Hypothyroidism, and Osteopenia  Care Plan : [place]  Updates made by Charlton Haws, [place] since 09/13/2022 12:00 AM     Problem: Hypertension, Hyperlipidemia, Atrial Fibrillation, GERD, Hypothyroidism, and Osteopenia   Priority: High     Long-Range Goal: Disease mgmt   Start Date: 01/15/2022  Expected End Date: 01/15/2023  This Visit's Progress: On track   Recent Progress: On track  Priority: High  Note:   Current Barriers:  Suboptimal therapeutic regimen for CAD/HLD  Pharmacist Clinical Goal(s):  Patient will adhere to plan to optimize therapeutic regimen for CAD/HLD as evidenced by report of adherence to recommended medication management changes through collaboration with PharmD and provider.   Interventions: 1:1 collaboration with Tonia Ghent, MD regarding development and update of comprehensive plan of care as evidenced by provider attestation and co-signature Inter-disciplinary care team collaboration (see longitudinal plan of care) Comprehensive medication review performed; medication list updated in electronic medical record  Hypertension / Heart Failure (BP goal <140/90) -Controlled - BP at home is at goal, HR is somewhat low at times;  -Home BP: n/a -Last ejection fraction: 65-70% (Date: 02/10/22) -HF type: Diastolic (grade II dysfunction); NYHA Class: II (slight limitation of activity) -Current treatment: Losartan 100 mg daily PM - Appropriate, Effective, Safe, Accessible Metoprolol succinate 50 mg BID - Appropriate, Effective, Safe, Accessible Furosemide 20 mg daily - Appropriate, Effective, Safe, Accessible Diltiazem CD 120 mg daily - Appropriate, Effective, Safe, Accessible -Medications previously tried: amlodipine, lisinopril, Jardiance, spironolactone -Educated on BP goals and benefits of medications for prevention of heart attack, stroke and kidney damage; -Educated on Benefits of medications for managing symptoms and prolonging life Importance of weighing daily; Proper diuretic administration and potassium supplementation -Counseled to monitor BP and weight at home daily -Recommended to continue current medication  Atrial Fibrillation (Goal: prevent stroke and major bleeding) -Not ideally controlled - INR has been subtherapeutic since May 2023, pt has follow up with coumadin clinic in 2 weeks -CHADSVASC: 4; Dx  2007, recurrence 01/2022 and started on warfarin at that time -Current treatment: Metoprolol succinate 50 mg daily - Appropriate, Effective, Safe, Accessible Diltiazem CD 120 mg daily -Appropriate, Effective, Safe, Accessible Amiodarone 200 mg daily -Appropriate, Effective, Safe, Accessible Warfarin 5 mg as directed -Appropriate, Query Effective -Medications previously tried: warfarin (stopped due to frequent falls) -Counseled on importance of adherence to anticoagulant exactly as prescribed; bleeding risk associated with warfarin and importance of self-monitoring for signs/symptoms of bleeding; importance of regular laboratory monitoring; -Reviewed anticoagulation options - pt had declined Factor Xa inhibitors due to cost -Recommended to continue current medication  Hyperlipidemia / CAD (LDL goal < 130) -Uncontrolled - LDL 161 (07/2022) above goal; she has refused statins; she stopped taking ezetimibe after a week due to side effects (fatigue, "felt awful") -Coronary CT 04/2022 - Ca score 902 (87th percentile). Stent to LAD 04/2022. -Has taken NTG once (before cath) -Current treatment: Clopidogrel 75 mg daily - Appropriate, Effective, Safe, Accessible Nitroglycerin 0.4 mg SL prn -Appropriate, Effective, Safe, Accessible -Medications previously tried: ezetimibe, statins -Educated on Cholesterol goals - LDL goal < 70 now that she has established ASCVD. Discussed difference between total cholesterol and LDL, importance of achieving LDL goal to prevent ASCVD recurrence; since she has not tolerated ezetimibe and refuses statins, next option would be PCSK9-inhibitors - she is wary of injections but should be able to handle auto-injector device  -Recommend to continue current medication; advised to f/u  with cardiology  Osteopenia (Goal prevent fractures) -Not ideally controlled -pt is not taking calcium or vitamin D supplements -Last DEXA Scan: 02/2015   T-Score femoral neck: -1.6  T-Score forearm  radius: 0.0  10-year probability of major osteoporotic fracture: 12.2%  10-year probability of hip fracture: 2.2% -Patient is not a candidate for pharmacologic treatment -Current treatment  None -Medications previously tried: none  -Recommend weight-bearing and muscle strengthening exercises for building and maintaining bone density.  Hypothyroidism (Goal: maintain TSH in goal range) -Controlled - pt reports she feels like her levothyroxine dose has to be increased, she is fatigued;  -Pt reports levothyroxine copay increased to $10/month ($30 per 90 days) this year; she can consider switching this to [employer] ($4 list) -Current treatment  Levothyroxine 100 mcg daily -Appropriate, Effective, Safe, Accessible -Discussed absorption of levothyroxine is optimal on empty stomach, separate from other meds;  -Recommended to continue current medication  GERD (Goal: manage symptoms) -Controlled - per pt report; she has switched from omeprazole to pantoprazole (DDI w/ clopiodgrel) -Current treatment  Pantoprazole 20 mg daily -Appropriate, Effective, Safe, Accessible TUMS prn -Medications previously tried: omeprazole (plavix DDI) -Recommend to stop omeprazole and start pantoprazole immediately  Pain (Goal: manage pain) -Controlled - pt does not take gabapentin often -"cracked disc" in back since teenage years -Current treatment  Gabapentin 100 mg TID prn -Appropriate, Effective, Safe, Accessible -Medications previously tried: n/a  -Recommended to continue current medication  Health Maintenance -Vaccine gaps: Flu, TDAP, Shingrix  Patient Goals/Self-Care Activities Patient will:  - take medications as prescribed as evidenced by patient report and record review focus on medication adherence by routine check blood pressure daily, document, and provide at future appointments weigh daily, and contact provider if weight gain of 3+ lbs overnight or 5+ lbs in 1 week     Medication Assistance:  None required.  Patient affirms current coverage meets needs.  Compliance/Adherence/Medication fill history: Care Gaps: None  [name]-Rating Drugs: Losartan - LF 11/23/21 x 90 ds; PDC 85%  Medication Access: Within the past 30 days, how often has patient missed a dose of medication? 0 Is a pillbox or other method used to improve adherence? No  Factors that may affect medication adherence? adverse effects of medications and lack of understanding of disease management Are meds synced by current pharmacy? No  Are meds delivered by current pharmacy? No  Does patient experience delays in picking up medications due to transportation concerns? No   [facility] Services Reviewed: Is patient disadvantaged to use [facility]?: No  Current Rx insurance plan: HTA Name and location of Current pharmacy:  [facility] [place] [address] [place] [address] Phone: [phone] Fax: [phone]  [facility] services reviewed with patient today?: No  Patient requests to transfer care to [facility]?: No  Reason patient declined to change pharmacies: Loyalty to other pharmacy/Patient preference   Care Plan and Follow Up Patient Decision:  Patient agrees to Care Plan and Follow-up.  Plan: Telephone follow up appointment with care management team member scheduled for:  6 months  Charlene Brooke, PharmD, BCACP Clinical Pharmacist [facility] [phone]

## 2022-09-13 NOTE — Patient Instructions (Signed)
Visit Information  Phone number for Pharmacist: [phone]   Goals Addressed   None     Care Plan : [place]  Updates made by Charlton Haws, RPH since 09/13/2022 12:00 AM     Problem: Hypertension, Hyperlipidemia, Atrial Fibrillation, GERD, Hypothyroidism, and Osteopenia   Priority: High     Long-Range Goal: Disease mgmt   Start Date: 01/15/2022  Expected End Date: 01/15/2023  This Visit's Progress: On track  Recent Progress: On track  Priority: High  Note:   Current Barriers:  Suboptimal therapeutic regimen for CAD/HLD  Pharmacist Clinical Goal(s):  Patient will adhere to plan to optimize therapeutic regimen for CAD/HLD as evidenced by report of adherence to recommended medication management changes through collaboration with PharmD and provider.   Interventions: 1:1 collaboration with Tonia Ghent, MD regarding development and update of comprehensive plan of care as evidenced by provider attestation and co-signature Inter-disciplinary care team collaboration (see longitudinal plan of care) Comprehensive medication review performed; medication list updated in electronic medical record  Hypertension / Heart Failure (BP goal <140/90) -Controlled - BP at home is at goal, HR is somewhat low at times;  -Home BP: n/a -Last ejection fraction: 65-70% (Date: 02/10/22) -HF type: Diastolic (grade II dysfunction); NYHA Class: II (slight limitation of activity) -Current treatment: Losartan 100 mg daily PM - Appropriate, Effective, Safe, Accessible Metoprolol succinate 50 mg BID - Appropriate, Effective, Safe, Accessible Furosemide 20 mg daily - Appropriate, Effective, Safe, Accessible Diltiazem CD 120 mg daily - Appropriate, Effective, Safe, Accessible -Medications previously tried: amlodipine, lisinopril, Jardiance, spironolactone -Educated on BP goals and benefits of medications for prevention of heart attack, stroke and kidney damage; -Educated on Benefits of  medications for managing symptoms and prolonging life Importance of weighing daily; Proper diuretic administration and potassium supplementation -Counseled to monitor BP and weight at home daily -Recommended to continue current medication  Atrial Fibrillation (Goal: prevent stroke and major bleeding) -Not ideally controlled - INR has been subtherapeutic since May 2023, pt has follow up with coumadin clinic in 2 weeks -CHADSVASC: 4; Dx 2007, recurrence 01/2022 and started on warfarin at that time -Current treatment: Metoprolol succinate 50 mg daily - Appropriate, Effective, Safe, Accessible Diltiazem CD 120 mg daily -Appropriate, Effective, Safe, Accessible Amiodarone 200 mg daily -Appropriate, Effective, Safe, Accessible Warfarin 5 mg as directed -Appropriate, Query Effective -Medications previously tried: warfarin (stopped due to frequent falls) -Counseled on importance of adherence to anticoagulant exactly as prescribed; bleeding risk associated with warfarin and importance of self-monitoring for signs/symptoms of bleeding; importance of regular laboratory monitoring; -Reviewed anticoagulation options - pt had declined Factor Xa inhibitors due to cost -Recommended to continue current medication  Hyperlipidemia / CAD (LDL goal < 130) -Uncontrolled - LDL 161 (07/2022) above goal; she has refused statins; she stopped taking ezetimibe after a week due to side effects (fatigue, "felt awful") -Coronary CT 04/2022 - Ca score 902 (87th percentile). Stent to LAD 04/2022. -Has taken NTG once (before cath) -Current treatment: Clopidogrel 75 mg daily - Appropriate, Effective, Safe, Accessible Nitroglycerin 0.4 mg SL prn -Appropriate, Effective, Safe, Accessible -Medications previously tried: ezetimibe, statins -Educated on Cholesterol goals - LDL goal < 70 now that she has established ASCVD. Discussed difference between total cholesterol and LDL, importance of achieving LDL goal to prevent ASCVD  recurrence; since she has not tolerated ezetimibe and refuses statins, next option would be PCSK9-inhibitors - she is wary of injections but should be able to handle auto-injector device  -Recommend to continue  current medication; advised to f/u with cardiology  Osteopenia (Goal prevent fractures) -Not ideally controlled -pt is not taking calcium or vitamin D supplements -Last DEXA Scan: 02/2015   T-Score femoral neck: -1.6  T-Score forearm radius: 0.0  10-year probability of major osteoporotic fracture: 12.2%  10-year probability of hip fracture: 2.2% -Patient is not a candidate for pharmacologic treatment -Current treatment  None -Medications previously tried: none  -Recommend weight-bearing and muscle strengthening exercises for building and maintaining bone density.  Hypothyroidism (Goal: maintain TSH in goal range) -Controlled - pt reports she feels like her levothyroxine dose has to be increased, she is fatigued;  -Pt reports levothyroxine copay increased to $10/month ($30 per 90 days) this year; she can consider switching this to [employer] ($4 list) -Current treatment  Levothyroxine 100 mcg daily -Appropriate, Effective, Safe, Accessible -Discussed absorption of levothyroxine is optimal on empty stomach, separate from other meds;  -Recommended to continue current medication  GERD (Goal: manage symptoms) -Controlled - per pt report; she has switched from omeprazole to pantoprazole (DDI w/ clopiodgrel) -Current treatment  Pantoprazole 20 mg daily -Appropriate, Effective, Safe, Accessible TUMS prn -Medications previously tried: omeprazole (plavix DDI) -Recommend to stop omeprazole and start pantoprazole immediately  Pain (Goal: manage pain) -Controlled - pt does not take gabapentin often -"cracked disc" in back since teenage years -Current treatment  Gabapentin 100 mg TID prn -Appropriate, Effective, Safe, Accessible -Medications previously tried: n/a  -Recommended to continue  current medication  Health Maintenance -Vaccine gaps: Flu, TDAP, Shingrix  Patient Goals/Self-Care Activities Patient will:  - take medications as prescribed as evidenced by patient report and record review focus on medication adherence by routine check blood pressure daily, document, and provide at future appointments weigh daily, and contact provider if weight gain of 3+ lbs overnight or 5+ lbs in 1 week      Patient verbalizes understanding of instructions and care plan provided today and agrees to view in [place]. Active MyChart status and patient understanding of how to access instructions and care plan via MyChart confirmed with patient.    Telephone follow up appointment with pharmacy team member scheduled for: 6 months  Charlene Brooke, PharmD, [facility] Clinical Pharmacist [facility] [phone]

## 2022-09-21 ENCOUNTER — Telehealth: Payer: Self-pay | Admitting: Cardiovascular Disease

## 2022-09-21 ENCOUNTER — Other Ambulatory Visit
Admission: RE | Admit: 2022-09-21 | Discharge: 2022-09-21 | Disposition: A | Discharge status: Home / Self Care | Payer: PPO | Source: Ambulatory Visit | Attending: Cardiology | Admitting: Cardiology

## 2022-09-21 ENCOUNTER — Ambulatory Visit
Admission: RE | Admit: 2022-09-21 | Discharge: 2022-09-21 | Disposition: A | Discharge status: Home / Self Care | Payer: PPO | Source: Ambulatory Visit | Attending: Cardiology | Admitting: Cardiology

## 2022-09-21 ENCOUNTER — Encounter: Payer: Self-pay | Admitting: Cardiology

## 2022-09-21 ENCOUNTER — Ambulatory Visit: Payer: PPO | Attending: Cardiology | Admitting: Cardiology

## 2022-09-21 VITALS — BP 127/70 | HR 55 | Ht 61.0 in | Wt 234.0 lb

## 2022-09-21 DIAGNOSIS — R0602 Shortness of breath: Secondary | ICD-10-CM

## 2022-09-21 DIAGNOSIS — J811 Chronic pulmonary edema: Secondary | ICD-10-CM | POA: Diagnosis not present

## 2022-09-21 DIAGNOSIS — I25118 Atherosclerotic heart disease of native coronary artery with other forms of angina pectoris: Secondary | ICD-10-CM | POA: Diagnosis not present

## 2022-09-21 DIAGNOSIS — I480 Paroxysmal atrial fibrillation: Secondary | ICD-10-CM

## 2022-09-21 DIAGNOSIS — I10 Essential (primary) hypertension: Secondary | ICD-10-CM

## 2022-09-21 DIAGNOSIS — R0600 Dyspnea, unspecified: Secondary | ICD-10-CM

## 2022-09-21 DIAGNOSIS — E782 Mixed hyperlipidemia: Secondary | ICD-10-CM

## 2022-09-21 LAB — CBC
HCT: 34.6 % — ABNORMAL LOW (ref 36.0–46.0)
Hemoglobin: 11.2 g/dL — ABNORMAL LOW (ref 12.0–15.0)
MCH: 28.6 pg (ref 26.0–34.0)
MCHC: 32.4 g/dL (ref 30.0–36.0)
MCV: 88.3 fL (ref 80.0–100.0)
Platelets: 310 10*3/uL (ref 150–400)
RBC: 3.92 MIL/uL (ref 3.87–5.11)
RDW: 15.8 % — ABNORMAL HIGH (ref 11.5–15.5)
WBC: 9.7 10*3/uL (ref 4.0–10.5)
nRBC: 0 % (ref 0.0–0.2)

## 2022-09-21 LAB — BASIC METABOLIC PANEL
Anion gap: 11 (ref 5–15)
BUN: 18 mg/dL (ref 8–23)
CO2: 25 mmol/L (ref 22–32)
Calcium: 9 mg/dL (ref 8.9–10.3)
Chloride: 104 mmol/L (ref 98–111)
Creatinine, Ser: 0.93 mg/dL (ref 0.44–1.00)
GFR, Estimated: 59 mL/min — ABNORMAL LOW (ref >60)
Glucose, Bld: 118 mg/dL — ABNORMAL HIGH (ref 70–99)
Potassium: 3.9 mmol/L (ref 3.5–5.1)
Sodium: 140 mmol/L (ref 135–145)

## 2022-09-21 LAB — BRAIN NATRIURETIC PEPTIDE: B Natriuretic Peptide: 398.8 pg/mL — ABNORMAL HIGH (ref 0.0–100.0)

## 2022-09-21 LAB — D-DIMER, QUANTITATIVE: D-Dimer, Quant: 0.52 ug/mL-FEU — ABNORMAL HIGH (ref 0.00–0.50)

## 2022-09-21 NOTE — Progress Notes (Unsigned)
Cardiology Office Note  Date:  09/22/2022   ID:  Julie Phillips, DOB 23-Mar-1932, MRN [id number]  PCP:  Tonia Ghent, MD  Cardiologist:  ***  _____________  Chief Complaint  Patient presents with   Shortness of Breath    Patient states that she can't get a deep breath. Patient states that she is tired. Meds reviewed with patient.     _____________   History of Present Illness: Julie Phillips is a 88 y.o. female who presents today for cardiology evaluation.   ***  Today, she denies symptoms of palpitations, chest pain, shortness of breath, orthopnea, PND, lower extremity edema, claudication, dizziness, presyncope, syncope, bleeding, or neurologic sequela. The patient is tolerating medications without difficulties and is otherwise without complaint today.  _____________   Past Medical History:  Diagnosis Date   Arrhythmia    atrial fibrillation   CHF (congestive heart failure) (HCC)    Chronic heart failure with preserved ejection fraction (HFpEF) ([place])    a. 01/2022 Echo: EF 65-70%, no rwma, mild LVH, GrII DD, nl RV fxn, mildly dil LA, mild MR, ao scelrosis.   Fall    GERD (gastroesophageal reflux disease)    History of chicken pox    Hx of colonic polyps    Hyperlipidemia    Hypertension    Hypothyroidism    Keratopathy 2018   R eye   LVH (left ventricular hypertrophy)    Persistent atrial fibrillation ([place])    a. Initially dx in 2012; b. 01/2022 recurrent AF rvr noted-->warfarin added (CHA2DS2VASc = 5); c. 03/2022 Amio added-->DCCV x 1 (150J).   Past Surgical History:  Procedure Laterality Date   CARDIOVASCULAR STRESS TEST  2011   normal    CARDIOVERSION N/A 04/02/2022   Procedure: CARDIOVERSION;  Surgeon: Minna Merritts, MD;  Location: [facility] ORS;  Service: Cardiovascular;  Laterality: N/A;   CORONARY STENT INTERVENTION N/A 05/06/2022   Procedure: CORONARY STENT INTERVENTION;  Surgeon: Wellington Hampshire, MD;  Location: [facility];  Service:  Cardiovascular;  Laterality: N/A;   keratopathy     LEFT HEART CATH AND CORONARY ANGIOGRAPHY N/A 05/06/2022   Procedure: LEFT HEART CATH AND CORONARY ANGIOGRAPHY;  Surgeon: Wellington Hampshire, MD;  Location: [facility];  Service: Cardiovascular;  Laterality: N/A;   _____________  Current Outpatient Medications  Medication Sig Dispense Refill   amiodarone (PACERONE) 200 MG tablet Take 1 tablet (200 mg total) by mouth daily. 90 tablet 3   clopidogrel (PLAVIX) 75 MG tablet Take 1 tablet (75 mg total) by mouth daily with breakfast. 30 tablet 6   furosemide (LASIX) 20 MG tablet Take 1 tablet (20 mg total) by mouth daily. 90 tablet 3   gabapentin (NEURONTIN) 100 MG capsule TAKE 1 CAPSULE BY MOUTH 3 TIMES DAILY ASNEEDED 270 capsule 3   levothyroxine (SYNTHROID) 100 MCG tablet TAKE 1 TABLET BY MOUTH ONCE A DAY BEFOREBREAKFAST. 90 tablet 3   losartan (COZAAR) 100 MG tablet Take 1 tablet (100 mg total) by mouth daily. 90 tablet 3   metoprolol succinate (TOPROL-XL) 50 MG 24 hr tablet TAKE 1 TABLET BY MOUTH TWICE A DAY. TAKEWITH FOOD 180 tablet 0   nitroGLYCERIN (NITROSTAT) 0.4 MG SL tablet Place 1 tablet (0.4 mg total) under the tongue every 5 (five) minutes as needed for chest pain. 25 tablet 3   pantoprazole (PROTONIX) 20 MG tablet Take 1 tablet (20 mg total) by mouth daily. 90 tablet 3   Polyethyl Glycol-Propyl Glycol (SYSTANE)  0.4-0.3 % GEL ophthalmic gel Place 1 application into both eyes.     potassium chloride (KLOR-CON) 10 MEQ tablet TAKE 1 TABLET BY MOUTH ONCE A DAY 90 tablet 1   warfarin (COUMADIN) 5 MG tablet TAKE 1 TABLET (5 MG) BY MOUTH DAILY EXCEPT TAKE 1/2 TABLET (2.5 MG) ON MONDAYS AND THURSDAYS OR AS DIRECTED BY ANTICOAGULATION CLINIC 110 tablet 1   diltiazem (CARDIZEM CD) 120 MG 24 hr capsule Take 1 capsule (120 mg total) by mouth daily. 90 capsule 3   No current facility-administered medications for this visit.   _____________   Allergies:   Statins, Wheat, Ezetimibe,  Jardiance [empagliflozin], Amlodipine, Eucalyptus oil, Lisinopril, and Rice  _____________   Social History:  The patient  reports that she quit smoking about 50 years ago. Her smoking use included cigarettes. She has a 40.00 pack-year smoking history. She has never used smokeless tobacco. She reports that she does not currently use alcohol. She reports that she does not use drugs.  _____________   Family History:  The patient's family history includes Breast cancer in her daughter and sister; Heart disease in her father and mother.  _____________   ROS:  Please see the history of present illness.   Positive for ***,   All other systems are reviewed and negative.  _____________   PHYSICAL EXAM: VS:  BP 127/70 (BP Location: Right Arm, Patient Position: Sitting, Cuff Size: Large)   Pulse (!) 55   Ht '5\' 1"'$  (1.549 m)   Wt 234 lb (106.1 kg)   SpO2 96%   BMI 44.21 kg/m  , BMI Body mass index is 44.21 kg/m. GEN: Well nourished, well developed, in no acute distress HEENT: normal Neck: no JVD, carotid bruits, or masses Cardiac: RRR; no murmurs, rubs, or gallops. No clubbing, cyanosis, edema.  Radials/DP/PT 2+ and equal bilaterally.  Respiratory:  clear to auscultation bilaterally, normal work of breathing GI: soft, nontender, nondistended, + BS MS: no deformity or atrophy Skin: warm and dry, no rash Neuro:  Strength and sensation are intact Psych: euthymic mood, full affect _____________  EKG:   The ekg ordered today shows ***  Recent Labs: 04/01/2022: ALT 10; Magnesium 2.1 08/12/2022: TSH 2.34 09/21/2022: B Natriuretic Peptide 398.8; BUN 18; Creatinine, Ser 0.93; Hemoglobin 11.2; Platelets 310; Potassium 3.9; Sodium 140  08/12/2022: Cholesterol 238; HDL 54.40; LDL Cholesterol 161; Total CHOL/HDL Ratio 4; Triglycerides 112.0; VLDL 22.4  Estimated Creatinine Clearance: 47.8 mL/min (by C-G formula based on SCr of 0.93 mg/dL).  Wt Readings from Last 3 Encounters:  09/21/22 234 lb (106.1  kg)  08/18/22 225 lb 15.5 oz (102.5 kg)  07/01/22 226 lb (102.5 kg)    _____________   ASSESSMENT AND PLAN:  1.  ***   Disposition:   FU with ***   Signed, Anelly Samarin, NP 09/22/2022 2:27 PM    _____________ [facility] [address] [place] [address]  [phone] (office) [phone] (fax)    Cardiology Clinic Note   Patient Name: Julie Phillips Date of Encounter: 09/21/2022  Primary Care Provider:  Tonia Ghent, MD Primary Cardiologist:  Ida Rogue, MD  Patient Profile    ***  Past Medical History    Past Medical History:  Diagnosis Date   Arrhythmia    atrial fibrillation   CHF (congestive heart failure) ([place])    Chronic heart failure with preserved ejection fraction (HFpEF) ([place])    a. 01/2022 Echo: EF 65-70%, no rwma, mild LVH, GrII DD, nl  RV fxn, mildly dil LA, mild MR, ao scelrosis.   Fall    GERD (gastroesophageal reflux disease)    History of chicken pox    Hx of colonic polyps    Hyperlipidemia    Hypertension    Hypothyroidism    Keratopathy 2018   R eye   LVH (left ventricular hypertrophy)    Persistent atrial fibrillation ([place])    a. Initially dx in 2012; b. 01/2022 recurrent AF rvr noted-->warfarin added (CHA2DS2VASc = 5); c. 03/2022 Amio added-->DCCV x 1 (150J).   Past Surgical History:  Procedure Laterality Date   CARDIOVASCULAR STRESS TEST  2011   normal    CARDIOVERSION N/A 04/02/2022   Procedure: CARDIOVERSION;  Surgeon: Minna Merritts, MD;  Location: [facility] ORS;  Service: Cardiovascular;  Laterality: N/A;   CORONARY STENT INTERVENTION N/A 05/06/2022   Procedure: CORONARY STENT INTERVENTION;  Surgeon: Wellington Hampshire, MD;  Location: [facility];  Service: Cardiovascular;  Laterality: N/A;   keratopathy     LEFT HEART CATH AND CORONARY ANGIOGRAPHY N/A 05/06/2022   Procedure: LEFT HEART CATH AND CORONARY ANGIOGRAPHY;  Surgeon: Wellington Hampshire, MD;  Location: [facility];   Service: Cardiovascular;  Laterality: N/A;    Allergies  Allergies  Allergen Reactions   Statins     Myalgias   Wheat     Throat swelling   Ezetimibe Other (See Comments)    Extreme fatigue   Jardiance [Empagliflozin] Other (See Comments)    Dry mouth, "gasping for breath"   Amlodipine     hematuria   Eucalyptus Oil    Lisinopril     Unrecalled BP med- possibly lisinopril- intolerant, had cough on the medicine- changed to cozaar   Rice Nausea And Vomiting    History of Present Illness    ***  Home Medications    Current Outpatient Medications  Medication Sig Dispense Refill   amiodarone (PACERONE) 200 MG tablet Take 1 tablet (200 mg total) by mouth daily. 90 tablet 3   clopidogrel (PLAVIX) 75 MG tablet Take 1 tablet (75 mg total) by mouth daily with breakfast. 30 tablet 6   furosemide (LASIX) 20 MG tablet Take 1 tablet (20 mg total) by mouth daily. 90 tablet 3   gabapentin (NEURONTIN) 100 MG capsule TAKE 1 CAPSULE BY MOUTH 3 TIMES DAILY ASNEEDED 270 capsule 3   levothyroxine (SYNTHROID) 100 MCG tablet TAKE 1 TABLET BY MOUTH ONCE A DAY BEFOREBREAKFAST. 90 tablet 3   losartan (COZAAR) 100 MG tablet Take 1 tablet (100 mg total) by mouth daily. 90 tablet 3   metoprolol succinate (TOPROL-XL) 50 MG 24 hr tablet TAKE 1 TABLET BY MOUTH TWICE A DAY. TAKEWITH FOOD 180 tablet 0   nitroGLYCERIN (NITROSTAT) 0.4 MG SL tablet Place 1 tablet (0.4 mg total) under the tongue every 5 (five) minutes as needed for chest pain. 25 tablet 3   pantoprazole (PROTONIX) 20 MG tablet Take 1 tablet (20 mg total) by mouth daily. 90 tablet 3   Polyethyl Glycol-Propyl Glycol (SYSTANE) 0.4-0.3 % GEL ophthalmic gel Place 1 application into both eyes.     potassium chloride (KLOR-CON) 10 MEQ tablet TAKE 1 TABLET BY MOUTH ONCE A DAY 90 tablet 1   warfarin (COUMADIN) 5 MG tablet TAKE 1 TABLET (5 MG) BY MOUTH DAILY EXCEPT TAKE 1/2 TABLET (2.5 MG) ON MONDAYS AND THURSDAYS OR AS DIRECTED BY ANTICOAGULATION CLINIC  110 tablet 1   diltiazem (CARDIZEM CD) 120 MG 24 hr capsule Take 1  capsule (120 mg total) by mouth daily. 90 capsule 3   No current facility-administered medications for this visit.     Family History    Family History  Problem Relation Age of Onset   Heart disease Mother    Heart disease Father    Breast cancer Sister    Breast cancer Daughter    Colon cancer Neg Hx    She indicated that her mother is deceased. She indicated that her father is deceased. She indicated that her sister is alive. She indicated that three of her four brothers are alive. She indicated that her daughter is deceased. She indicated that the status of her neg hx is unknown.  Social History    Social History   Socioeconomic History   Marital status: Widowed    Spouse name: Not on file   Number of children: Not on file   Years of education: Not on file   Highest education level: Not on file  Occupational History   Occupation: Retired Armed forces technical officer: RETIRED  Tobacco Use   Smoking status: Former    Packs/day: 2.00    Years: 20.00    Total pack years: 40.00    Types: Cigarettes    Quit date: 12/14/1971    Years since quitting: 50.8   Smokeless tobacco: Never  Vaping Use   Vaping Use: Never used  Substance and Sexual Activity   Alcohol use: Not Currently   Drug use: No   Sexual activity: Not Currently  Other Topics Concern   Not on file  Social History Narrative   Divorced   Education:  [employer]   8 pregnancies, 6 live births   LMP: 1985   Lives in independent living.     Daughter died 03-14-22   Social Determinants of Health   Financial Resource Strain: Low Risk  (06/24/2022)   Overall Financial Resource Strain (CARDIA)    Difficulty of Paying Living Expenses: Not hard at all  Food Insecurity: No Food Insecurity (06/24/2022)   Hunger Vital Sign    Worried About Running Out of Food in the Last Year: Never true    Ran Out of Food in the Last Year: Never true   Transportation Needs: No Transportation Needs (07/09/2019)   PRAPARE - Hydrologist (Medical): No    Lack of Transportation (Non-Medical): No  Physical Activity: Inactive (06/24/2022)   Exercise Vital Sign    Days of Exercise per Week: 0 days    Minutes of Exercise per Session: 0 min  Stress: No Stress Concern Present (06/24/2022)   [place]    Feeling of Stress : Not at all  Social Connections: Not on file  Intimate Partner Violence: Not At Risk (07/09/2019)   Humiliation, Afraid, Rape, and Kick questionnaire    Fear of Current or Ex-Partner: No    Emotionally Abused: No    Physically Abused: No    Sexually Abused: No     Review of Systems    General:  No chills, fever, night sweats or weight changes.  Cardiovascular:  No chest pain, dyspnea on exertion, edema, orthopnea, palpitations, paroxysmal nocturnal dyspnea. Dermatological: No rash, lesions/masses Respiratory: No cough, dyspnea Urologic: No hematuria, dysuria Abdominal:   No nausea, vomiting, diarrhea, bright red blood per rectum, melena, or hematemesis Neurologic:  No visual changes, wkns, changes in mental status. All other systems reviewed and are otherwise negative except as noted above.  Physical Exam    VS:  BP 127/70 (BP Location: Right Arm, Patient Position: Sitting, Cuff Size: Large)   Pulse (!) 55   Ht '5\' 1"'$  (1.549 m)   Wt 234 lb (106.1 kg)   SpO2 96%   BMI 44.21 kg/m  , BMI Body mass index is 44.21 kg/m. STOP-Bang Score:  6  { Consider Dx Sleep Disordered Breathing or Sleep Apnea  ICD G47.33          :1}    GEN: Well nourished, well developed, in no acute distress. HEENT: normal. Neck: Supple, no JVD, carotid bruits, or masses. Cardiac: RRR, no murmurs, rubs, or gallops. No clubbing, cyanosis, edema.  Radials/DP/PT 2+ and equal bilaterally.  Respiratory:  Respirations regular and unlabored, clear to  auscultation bilaterally. GI: Soft, nontender, nondistended, BS + x 4. MS: no deformity or atrophy. Skin: warm and dry, no rash. Neuro:  Strength and sensation are intact. Psych: Normal affect.  Accessory Clinical Findings    ECG personally reviewed by me today- *** - No acute changes  Lab Results  Component Value Date   WBC 9.9 08/18/2022   HGB 12.3 08/18/2022   HCT 37.4 08/18/2022   MCV 85.8 08/18/2022   PLT 304 08/18/2022   Lab Results  Component Value Date   CREATININE 1.00 08/18/2022   BUN 27 (H) 08/18/2022   NA 136 08/18/2022   K 4.4 08/18/2022   CL 102 08/18/2022   CO2 25 08/18/2022   Lab Results  Component Value Date   ALT 10 04/01/2022   AST 15 04/01/2022   ALKPHOS 91 04/01/2022   BILITOT 0.8 04/01/2022   Lab Results  Component Value Date   CHOL 238 (H) 08/12/2022   HDL 54.40 08/12/2022   LDLCALC 161 (H) 08/12/2022   LDLDIRECT 152.6 12/31/2013   TRIG 112.0 08/12/2022   CHOLHDL 4 08/12/2022    No results found for: "HGBA1C"  Assessment & Plan   1.  ***  Darragh Nay, NP 09/21/2022, 3:08 PM

## 2022-09-21 NOTE — Telephone Encounter (Signed)
Patient calling in regarding her shortness of breath. She reports that on Saturday she had good energy levels with no problems. On Sunday she went to the store, then went out to eat with her daughter and felt good until after they ate dinner and then she became fatigued. On Monday she reports being so tired she kept moving from the chair to the couch, and then to bed. Last night she woke up 4 times due to being so short of breath. Current weight is 228 lbs and no swelling at this time. Her concerns are with the shortness of breath, no energy, and just overall fatigue. Offered appointment for today and she was agreeable. No further needs at this time.

## 2022-09-21 NOTE — Telephone Encounter (Signed)
Pt c/o Shortness Of Breath: STAT if SOB developed within the last 24 hours or pt is noticeably SOB on the phone  1. Are you currently SOB (can you hear that pt is SOB on the phone)? A little at this time  2. How long have you been experiencing SOB? Started over the weekend  3. Are you SOB when sitting or when up moving around? Most when she lays down  4. Are you currently experiencing any other symptoms? Real tired, no energy- pt wanted an appointment-

## 2022-09-21 NOTE — Telephone Encounter (Signed)
OK sounds good.

## 2022-09-21 NOTE — Patient Instructions (Signed)
Medication Instructions:   Your physician recommends that you continue on your current medications as directed. Please refer to the Current Medication list given to you today.  *If you need a refill on your cardiac medications before your next appointment, please call your pharmacy*   Lab Work:  Your provider recommends you go to the medical mall for lab work today.   If you have labs (blood work) drawn today and your tests are completely normal, you will receive your results only by: [place] (if you have MyChart) OR A paper copy in the mail If you have any lab test that is abnormal or we need to change your treatment, we will call you to review the results.   Testing/Procedures:  Your provider recommends you go to the medical mall for a chest x-ray.   Follow-Up: At [facility], you and your health needs are our priority.  As part of our continuing mission to provide you with exceptional heart care, we have created designated Provider Care Teams.  These Care Teams include your primary Cardiologist (physician) and Advanced Practice Providers (APPs -  Physician Assistants and Nurse Practitioners) who all work together to provide you with the care you need, when you need it.  We recommend signing up for the patient portal called "MyChart".  Sign up information is provided on this After Visit Summary.  MyChart is used to connect with patients for Virtual Visits (Telemedicine).  Patients are able to view lab/test results, encounter notes, upcoming appointments, etc.  Non-urgent messages can be sent to your provider as well.   To learn more about what you can do with MyChart, go to [email or web address].    Your next appointment:   1 week(s)  The format for your next appointment:   In Person  Provider:   You may see Ida Rogue, MD or one of the following Advanced Practice Providers on your designated Care Team:   Murray Hodgkins, NP Christell Faith, PA-C [place]  Kathlen Mody, PA-C Gerrie Nordmann, NP

## 2022-09-21 NOTE — Progress Notes (Signed)
Cardiology Clinic Note   Patient Name: Julie Phillips Date of Encounter: 09/22/2022  Primary Care Provider:  Tonia Ghent, MD Primary Cardiologist:  Ida Rogue, MD  Patient Profile    88 year old female with history of persistent atrial fibrillation, hypertension, hyperlipidemia, hypothyroidism, frequent falls, and obesity, presents for follow-up for HFpEF and atrial fibrillation.  Past Medical History    Past Medical History:  Diagnosis Date   Arrhythmia    atrial fibrillation   CHF (congestive heart failure) (HCC)    Chronic heart failure with preserved ejection fraction (HFpEF) ([place])    a. 01/2022 Echo: EF 65-70%, no rwma, mild LVH, GrII DD, nl RV fxn, mildly dil LA, mild MR, ao scelrosis.   Fall    GERD (gastroesophageal reflux disease)    History of chicken pox    Hx of colonic polyps    Hyperlipidemia    Hypertension    Hypothyroidism    Keratopathy 2018   R eye   LVH (left ventricular hypertrophy)    Persistent atrial fibrillation ([place])    a. Initially dx in 2012; b. 01/2022 recurrent AF rvr noted-->warfarin added (CHA2DS2VASc = 5); c. 03/2022 Amio added-->DCCV x 1 (150J).   Past Surgical History:  Procedure Laterality Date   CARDIOVASCULAR STRESS TEST  2011   normal    CARDIOVERSION N/A 04/02/2022   Procedure: CARDIOVERSION;  Surgeon: Minna Merritts, MD;  Location: [facility] ORS;  Service: Cardiovascular;  Laterality: N/A;   CORONARY STENT INTERVENTION N/A 05/06/2022   Procedure: CORONARY STENT INTERVENTION;  Surgeon: Wellington Hampshire, MD;  Location: [place] [facility];  Service: Cardiovascular;  Laterality: N/A;   keratopathy     LEFT HEART CATH AND CORONARY ANGIOGRAPHY N/A 05/06/2022   Procedure: LEFT HEART CATH AND CORONARY ANGIOGRAPHY;  Surgeon: Wellington Hampshire, MD;  Location: [place] [facility];  Service: Cardiovascular;  Laterality: N/A;    Allergies  Allergies  Allergen Reactions   Statins     Myalgias   Wheat     Throat swelling    Ezetimibe Other (See Comments)    Extreme fatigue   Jardiance [Empagliflozin] Other (See Comments)    Dry mouth, "gasping for breath"   Amlodipine     hematuria   Eucalyptus Oil    Lisinopril     Unrecalled BP med- possibly lisinopril- intolerant, had cough on the medicine- changed to cozaar   Rice Nausea And Vomiting    History of Present Illness    Julie Phillips is an 88 year old female with a past medical history including persistent atrial fibrillation chronic anticoagulation with Coumadin, hypertension, hyperlipidemia, hypothyroidism, frequent falls, and obesity.    She was initially diagnosed with atrial fibrillation septated in the setting of hypokalemia in 2007.  Echo at that time showed preserved EF with mild mitral and tricuspid regurgitation.  Stress testing was nonischemic.  She was initially placed on warfarin but this was discontinued due to frequent falls.  February 2023 she was admitted to [facility] regional with chest pressure and mild high-sensitivity troponin elevation, in the setting of atrial fibrillation with RVR.  She reported at that time tachypalpitations dating back to around Christmas 2022.  Echocardiogram during admission revealed EF of 65 to 70% without regional wall motion abnormalities, mild LVH, G2 DD, mildly dilated left atrium and mild MR and aortic valve sclerosis.  She reported improved balance at home which was completed by coworker with recommendations for outpatient ischemic testing.  In March clinical follow-up, she  was maintaining sinus rhythm on diltiazem and metoprolol.  She was scheduled for coronary CT angiography however upon presentation was noted to be in atrial fibrillation and this was canceled.  Diltiazem was titrated to 180 mg daily however she did not tolerate this and continue to take 120 mg daily.  Unfortunately on April 21 she presented to primary care with complaints of dyspnea and was found to be in A-fib with RVR in the 140s.  She was  subsequently transferred to the [facility] emergency department where BNP was elevated to 696 and she was treated with intravenous Lasix.  She was placed on IV amiodarone and subsequently underwent successful cardioversion on April 21.  Amiodarone was transitioned to oral dosing and she was discharged home.  04/22/2022 she underwent coronary CT which revealed coronary calcium score of 902 which is 75 percentile for age and sex matched control, normal coronary origin with right dominance, calcified plaque causing severe proximal mid LAD stenosis (greater than 70%), calcified plaque with moderate proximal left circumflex stenosis (50%), CAD-RADS 4 severe stenosis (70-99% or greater than 50% left main) cardiac catheterization was recommended.  She presented for an elective left heart catheterization on 05/06/2022 where she had a second marginal lesion 90% stenosed, mid LAD lesion is 90% stenosed, and the mid LAD to distal LAD stenosis of 20%.  And she underwent successful DES placement to the mid LAD.  Cath revealed severe two-vessel coronary artery disease with 90% stenosis to the mid LAD and subtotal occlusion to the small OM 2, codominant coronary arteries, coronary arteries are moderately calcified overall, mildly elevated LVEDP, left ventricular angiography was not performed EF was normal by echocardiogram.  She was last seen in clinic 06/18/2022 by Dr. Rockey Situ with complaints of shortness of breath with exertion and occasional stabbing chest pains in the chest.  No changes were made to her medication at that time.  She returns to clinic today as a work-in with complaints of shortness of breath.  She states is gradually getting worse and she has noted that even when she is asleep at night she wakes up gasping for air.  She denies any chest discomfort, chest tightness or chest pressure.  She states that she does not have orthopnea she has not had increased amount of pillows that she sleeps with as well she has noticed  no increase abdominal bloating or swelling to her bilateral lower extremities.  She also denies any recent hospitalizations or visits to the emergency department.  Home Medications    Current Outpatient Medications  Medication Sig Dispense Refill   amiodarone (PACERONE) 200 MG tablet Take 1 tablet (200 mg total) by mouth daily. 90 tablet 3   clopidogrel (PLAVIX) 75 MG tablet Take 1 tablet (75 mg total) by mouth daily with breakfast. 30 tablet 6   furosemide (LASIX) 20 MG tablet Take 1 tablet (20 mg total) by mouth daily. 90 tablet 3   gabapentin (NEURONTIN) 100 MG capsule TAKE 1 CAPSULE BY MOUTH 3 TIMES DAILY ASNEEDED 270 capsule 3   levothyroxine (SYNTHROID) 100 MCG tablet TAKE 1 TABLET BY MOUTH ONCE A DAY BEFOREBREAKFAST. 90 tablet 3   losartan (COZAAR) 100 MG tablet Take 1 tablet (100 mg total) by mouth daily. 90 tablet 3   metoprolol succinate (TOPROL-XL) 50 MG 24 hr tablet TAKE 1 TABLET BY MOUTH TWICE A DAY. TAKEWITH FOOD 180 tablet 0   nitroGLYCERIN (NITROSTAT) 0.4 MG SL tablet Place 1 tablet (0.4 mg total) under the tongue every 5 (five) minutes as  needed for chest pain. 25 tablet 3   pantoprazole (PROTONIX) 20 MG tablet Take 1 tablet (20 mg total) by mouth daily. 90 tablet 3   Polyethyl Glycol-Propyl Glycol (SYSTANE) 0.4-0.3 % GEL ophthalmic gel Place 1 application into both eyes.     potassium chloride (KLOR-CON) 10 MEQ tablet TAKE 1 TABLET BY MOUTH ONCE A DAY 90 tablet 1   warfarin (COUMADIN) 5 MG tablet TAKE 1 TABLET (5 MG) BY MOUTH DAILY EXCEPT TAKE 1/2 TABLET (2.5 MG) ON MONDAYS AND THURSDAYS OR AS DIRECTED BY ANTICOAGULATION CLINIC 110 tablet 1   diltiazem (CARDIZEM CD) 120 MG 24 hr capsule Take 1 capsule (120 mg total) by mouth daily. 90 capsule 3   No current facility-administered medications for this visit.     Family History    Family History  Problem Relation Age of Onset   Heart disease Mother    Heart disease Father    Breast cancer Sister    Breast cancer Daughter     Colon cancer Neg Hx    She indicated that her mother is deceased. She indicated that her father is deceased. She indicated that her sister is alive. She indicated that three of her four brothers are alive. She indicated that her daughter is deceased. She indicated that the status of her neg hx is unknown.  Social History    Social History   Socioeconomic History   Marital status: Widowed    Spouse name: Not on file   Number of children: Not on file   Years of education: Not on file   Highest education level: Not on file  Occupational History   Occupation: Retired Armed forces technical officer: RETIRED  Tobacco Use   Smoking status: Former    Packs/day: 2.00    Years: 20.00    Total pack years: 40.00    Types: Cigarettes    Quit date: 12/14/1971    Years since quitting: 50.8   Smokeless tobacco: Never  Vaping Use   Vaping Use: Never used  Substance and Sexual Activity   Alcohol use: Not Currently   Drug use: No   Sexual activity: Not Currently  Other Topics Concern   Not on file  Social History Narrative   Divorced   Education:  [employer]   8 pregnancies, 6 live births   LMP: 1985   Lives in independent living.     Daughter died 03-12-2022   Social Determinants of Health   Financial Resource Strain: Low Risk  (06/24/2022)   Overall Financial Resource Strain (CARDIA)    Difficulty of Paying Living Expenses: Not hard at all  Food Insecurity: No Food Insecurity (06/24/2022)   Hunger Vital Sign    Worried About Running Out of Food in the Last Year: Never true    Ran Out of Food in the Last Year: Never true  Transportation Needs: No Transportation Needs (07/09/2019)   PRAPARE - Hydrologist (Medical): No    Lack of Transportation (Non-Medical): No  Physical Activity: Inactive (06/24/2022)   Exercise Vital Sign    Days of Exercise per Week: 0 days    Minutes of Exercise per Session: 0 min  Stress: No Stress Concern Present (06/24/2022)    [place]    Feeling of Stress : Not at all  Social Connections: Not on file  Intimate Partner Violence: Not At Risk (07/09/2019)   Humiliation, Afraid, Rape, and Kick questionnaire  Fear of Current or Ex-Partner: No    Emotionally Abused: No    Physically Abused: No    Sexually Abused: No     Review of Systems    General:  No chills, fever, night sweats or weight changes.  Endorses fatigue Cardiovascular:  No chest pain, dorsalis dyspnea on exertion, but denies edema, orthopnea, palpitations, endorses paroxysmal nocturnal dyspnea. Dermatological: No rash, lesions/masses Respiratory: No cough, endorses dyspnea Urologic: No hematuria, dysuria Abdominal:   No nausea, vomiting, diarrhea, bright red blood per rectum, melena, or hematemesis Neurologic:  No visual changes, wkns, changes in mental status. All other systems reviewed and are otherwise negative except as noted above.   Physical Exam    VS:  BP 127/70 (BP Location: Right Arm, Patient Position: Sitting, Cuff Size: Large)   Pulse (!) 55   Ht '5\' 1"'$  (1.549 m)   Wt 234 lb (106.1 kg)   SpO2 96%   BMI 44.21 kg/m  , BMI Body mass index is 44.21 kg/m. STOP-Bang Score:  6      GEN: Well nourished, well developed, in no acute distress. HEENT: normal. Neck: Supple, no JVD, carotid bruits, or masses. Cardiac: RRR, no murmurs, rubs, or gallops. No clubbing, cyanosis, edema.  Radials/DP/PT 2+ and equal bilaterally.  Respiratory:  Respirations regular and unlabored, clear to auscultation bilaterally. GI: Soft, nontender, nondistended, obese, BS + x 4. MS: no deformity or atrophy. Skin: warm and dry, no rash. Neuro:  Strength and sensation are intact. Psych: Normal affect.  Accessory Clinical Findings    ECG personally reviewed by me today-sinus bradycardia with a rate of 55- No acute changes  Lab Results  Component Value Date   WBC 9.7 09/21/2022   HGB 11.2  (L) 09/21/2022   HCT 34.6 (L) 09/21/2022   MCV 88.3 09/21/2022   PLT 310 09/21/2022   Lab Results  Component Value Date   CREATININE 0.93 09/21/2022   BUN 18 09/21/2022   NA 140 09/21/2022   K 3.9 09/21/2022   CL 104 09/21/2022   CO2 25 09/21/2022   Lab Results  Component Value Date   ALT 10 04/01/2022   AST 15 04/01/2022   ALKPHOS 91 04/01/2022   BILITOT 0.8 04/01/2022   Lab Results  Component Value Date   CHOL 238 (H) 08/12/2022   HDL 54.40 08/12/2022   LDLCALC 161 (H) 08/12/2022   LDLDIRECT 152.6 12/31/2013   TRIG 112.0 08/12/2022   CHOLHDL 4 08/12/2022    No results found for: "HGBA1C"  Assessment & Plan   1.  New onset shortness of breath with with nightly PND.  Last echocardiogram in 02/10/2022 revealed LVEF 65-70%.  Patient being sent for chest x-ray, BNP, CBC, BMP, and D-dimer to evaluate sudden onset shortness of breath.  2.  Coronary artery disease with PCI/DES to the mid LAD in 04/2022.  She denies any current chest pain at this time.  After previous mentioned work-up for shortness of breath if results are negative we will consider shortness of breath chest pain equivalent and continue with ischemic work-up.  Continued on clopidogrel 75 mg daily.  She has a history of statin intolerance.  Dr. Rockey Situ had previously recommended to started on Zetia but she noted an intolerance to extreme fatigue and was unable to continue with taking the medication, she would likely need a PCSK9 inhibitor in the future.  3.  Essential hypertension with blood pressure 127/70.  Blood pressures consistent send are stable today.  She is to continue on  losartan 100 mg daily, Toprol-XL 50 mg daily, diltiazem 120 mg daily  4.  Paroxysmal atrial fibrillation currently in sinus rhythm.  She is continued on amiodarone 200 mg daily.  Was taking diltiazem 180 mg daily for the last 2 weeks and has noticed a drop in her heart rate into the 50s.  Previously she did not tolerate the 180 mg and a  prescription was sent in for her 120 mg daily.  She is also continued on warfarin for CHA2DS2-VASc score at least 5 (sex, age, hypertension, previous MI).  5.  Hyperlipidemia with a last LDL of 161 08/02/2022.  She has a statin intolerance.  Dr. Rockey Situ started on Zetia 10 mg daily unfortunately patient has an intolerance listed to the medication stating that is causing extreme fatigue.  We will discuss PCSK9 inhibitor therapy on her return.  6.  Disposition patient return to clinic to see MD/APP within the next 1 to 2 weeks or sooner if needed.  Kiree Dejarnette, NP 09/22/2022, 2:26 PM

## 2022-09-22 ENCOUNTER — Ambulatory Visit
Admission: RE | Admit: 2022-09-22 | Discharge: 2022-09-22 | Disposition: A | Discharge status: Home / Self Care | Payer: PPO | Source: Ambulatory Visit | Attending: Cardiology | Admitting: Cardiology

## 2022-09-22 ENCOUNTER — Telehealth: Payer: Self-pay

## 2022-09-22 ENCOUNTER — Encounter: Payer: Self-pay | Admitting: Cardiology

## 2022-09-22 DIAGNOSIS — R7989 Other specified abnormal findings of blood chemistry: Secondary | ICD-10-CM | POA: Insufficient documentation

## 2022-09-22 MED ORDER — IOHEXOL 350 MG/ML SOLN
75.0000 mL | Freq: Once | INTRAVENOUS | Status: AC | PRN
  Administered 2022-09-22: 75 mL via INTRAVENOUS

## 2022-09-22 MED ORDER — FUROSEMIDE 20 MG PO TABS: 40.0000 mg | ORAL_TABLET | Freq: Every day | ORAL | 1 refills | Status: DC

## 2022-09-22 NOTE — Telephone Encounter (Signed)
Called patient and reviewed result note with her. Scheduled her for a CTA of chest for this afternoon.

## 2022-09-22 NOTE — Telephone Encounter (Signed)
-----   Message from Gerrie Nordmann, NP sent at 09/22/2022  7:35 AM EDT ----- Please schedule patient for a CTA of the chest to r/o PE for elevated d-dimer and have her increase her lasix to 40 mg daily her BNP is elevated so she is retaining extra fluid. Thank you.

## 2022-09-22 NOTE — Progress Notes (Signed)
No evidence of PE. Continue with increased lasix dose and keep follow up appointment.

## 2022-09-22 NOTE — Addendum Note (Signed)
Addended by: Kavin Leech on: 09/22/2022 02:39 PM   Modules accepted: Orders

## 2022-09-23 ENCOUNTER — Ambulatory Visit (INDEPENDENT_AMBULATORY_CARE_PROVIDER_SITE_OTHER): Payer: PPO | Admitting: Clinical

## 2022-09-23 ENCOUNTER — Ambulatory Visit (INDEPENDENT_AMBULATORY_CARE_PROVIDER_SITE_OTHER): Payer: PPO

## 2022-09-23 DIAGNOSIS — Z7901 Long term (current) use of anticoagulants: Secondary | ICD-10-CM | POA: Diagnosis not present

## 2022-09-23 DIAGNOSIS — F4321 Adjustment disorder with depressed mood: Secondary | ICD-10-CM | POA: Diagnosis not present

## 2022-09-23 LAB — POCT INR: INR: 2.5 (ref 2.0–3.0)

## 2022-09-23 NOTE — Progress Notes (Signed)
[facility] Counselor/Therapist Progress Note  Patient ID: Julie Phillips, MRN: [id number]    Date: 09/23/22  Time Spent: 1:36  pm - 2:28 pm : 52 Minutes  Treatment Type: Individual Therapy.  Reported Symptoms: patient reported sadness, fatigue, and difficulty staying asleep  Mental Status Exam: Appearance:  Neat     Behavior: Appropriate  Motor: Normal  Speech/Language:  Clear and Coherent  Affect: Tearful  Mood: sad  Thought process: normal  Thought content:   WNL  Sensory/Perceptual disturbances:   WNL  Orientation: oriented to person, place, and time/date  Attention: Good  Concentration: Good  Memory: WNL  Fund of knowledge:  Good  Insight:   Good  Judgment:  Good  Impulse Control: Good   Risk Assessment: Danger to Self:  No Patient denied current suicidal ideation Self-injurious Behavior: No Danger to Others: No Patient denied current homicidal ideation Duty to Warn:no Physical Aggression / Violence:No  Access to Firearms a concern: No  Gang Involvement:No   Subjective:  Patient stated, "today was bad". Patient reported when scheduling a follow up appointment today she was offer an appointment on her daughter's birthday. Patient reported feeling sad in response. Patient reported she is working on her Best boy. Patient reported feeling upset that daughter was diagnosed with stage 4 cancer in such a short amount of time. Patient stated, "I mean it doesn't make any sense, people do not develop stage 4 overnight". Patient stated, "its just annoying me really bad". Patient reported writing helps her work through situations. Patient reported feeling she can't talk to others about her daughter's death. Patient stated, "it's affecting my sleep". Patient reported waking up due to dreams and shortness of breath. Patient reported she has been using breathing exercises in response to shortness of breath.  Patient reported she would like to think about the  potential goals and will let clinician know her thoughts next session.   Interventions: Cognitive Behavioral Therapy and Motivational Interviewing. Discussed patient's statement, "today was bad" and identified triggers for change in mood. Normalized patient's feelings of sadness and thoughts in response to grief/loss of her daughter. Processed patient's feelings related to her daughter's diagnosis. Recommended patient schedule a follow up appointment with her primary care provider. Clinician utilized motivational interviewing to explore potential goals for therapy.   Diagnosis:  Adjustment disorder with depressed mood   Plan: Patient reported she would like to think about the following potential goals and will let clinician know her thoughts next session.   Potential long term goal: Reduce overall level, frequency, and intensity of depressive symptoms as evidenced by decreased crying, changes in energy, changes in appetite, fatigue, and difficulty staying asleep from 5 to 7 days per week to 0 to 1 days per week per patient's report for at least 3 consecutive months.  Potential short term goals: Begin a healthy grieving process surrounding the loss of patient's daughter Verbalize feelings associated with loss of patient's daughter Increase understanding of the steps in the grief process                      Katherina Right, LCSW

## 2022-09-23 NOTE — Patient Instructions (Addendum)
Continue 1 tablet daily.  Recheck on 10/28/22 at 10:30.

## 2022-09-23 NOTE — Progress Notes (Signed)
Continue 1 tablet daily.  Recheck in 5 weeks per pt request.

## 2022-09-24 ENCOUNTER — Telehealth: Payer: Self-pay | Admitting: *Deleted

## 2022-09-24 NOTE — Telephone Encounter (Signed)
Left voicemail message to call back for review of results.  

## 2022-09-24 NOTE — Telephone Encounter (Signed)
Patient returned RN's call regarding results. 

## 2022-09-24 NOTE — Telephone Encounter (Signed)
-----   Message from Gerrie Nordmann, NP sent at 09/22/2022  7:36 AM EDT ----- Increasing her lasix should help with some of the breathing issues. She appears to be retaining extra fluid.

## 2022-09-24 NOTE — Telephone Encounter (Signed)
-----   Message from Gerrie Nordmann, NP sent at 09/22/2022  7:35 AM EDT ----- Please schedule patient for a CTA of the chest to r/o PE for elevated d-dimer and have her increase her lasix to 40 mg daily her BNP is elevated so she is retaining extra fluid. Thank you.

## 2022-09-24 NOTE — Telephone Encounter (Signed)
Pt is returning call.  

## 2022-09-24 NOTE — Telephone Encounter (Signed)
Reviewed results and recommendations with patient and she verbalized understanding with no further questions at this time.  

## 2022-09-24 NOTE — Telephone Encounter (Signed)
-----   Message from Gerrie Nordmann, NP sent at 09/22/2022  4:47 PM EDT ----- No evidence of PE. Continue with increased lasix dose and keep follow up appointment.

## 2022-09-27 NOTE — Addendum Note (Signed)
Addended by: James Ivanoff D on: 09/27/2022 11:08 AM   Modules accepted: Orders

## 2022-09-29 DIAGNOSIS — H18421 Band keratopathy, right eye: Secondary | ICD-10-CM | POA: Diagnosis not present

## 2022-09-29 DIAGNOSIS — H18891 Other specified disorders of cornea, right eye: Secondary | ICD-10-CM | POA: Diagnosis not present

## 2022-09-29 NOTE — Progress Notes (Unsigned)
Cardiology Clinic Note   Patient Name: Julie Phillips Date of Encounter: 09/30/2022  Primary Care Provider:  Tonia Ghent, MD Primary Cardiologist:  Ida Rogue, MD  Patient Profile    88 year old female with a history of persistent atrial fibrillation, hypertension, hyperlipidemia, hypothyroidism, frequent falls, and obesity, who presents today for HFpEF and atrial fibrillation.  Past Medical History    Past Medical History:  Diagnosis Date   Arrhythmia    atrial fibrillation   CHF (congestive heart failure) (HCC)    Chronic heart failure with preserved ejection fraction (HFpEF) ([place])    a. 01/2022 Echo: EF 65-70%, no rwma, mild LVH, GrII DD, nl RV fxn, mildly dil LA, mild MR, ao scelrosis.   Fall    GERD (gastroesophageal reflux disease)    History of chicken pox    Hx of colonic polyps    Hyperlipidemia    Hypertension    Hypothyroidism    Keratopathy 2018   R eye   LVH (left ventricular hypertrophy)    Persistent atrial fibrillation ([place])    a. Initially dx in 2012; b. 01/2022 recurrent AF rvr noted-->warfarin added (CHA2DS2VASc = 5); c. 03/2022 Amio added-->DCCV x 1 (150J).   Past Surgical History:  Procedure Laterality Date   CARDIOVASCULAR STRESS TEST  2011   normal    CARDIOVERSION N/A 04/02/2022   Procedure: CARDIOVERSION;  Surgeon: Minna Merritts, MD;  Location: [facility] ORS;  Service: Cardiovascular;  Laterality: N/A;   CORONARY STENT INTERVENTION N/A 05/06/2022   Procedure: CORONARY STENT INTERVENTION;  Surgeon: Wellington Hampshire, MD;  Location: [facility];  Service: Cardiovascular;  Laterality: N/A;   keratopathy     LEFT HEART CATH AND CORONARY ANGIOGRAPHY N/A 05/06/2022   Procedure: LEFT HEART CATH AND CORONARY ANGIOGRAPHY;  Surgeon: Wellington Hampshire, MD;  Location: [facility];  Service: Cardiovascular;  Laterality: N/A;    Allergies  Allergies  Allergen Reactions   Statins     Myalgias   Wheat     Throat swelling    Ezetimibe Other (See Comments)    Extreme fatigue   Jardiance [Empagliflozin] Other (See Comments)    Dry mouth, "gasping for breath"   Amlodipine     hematuria   Eucalyptus Oil    Lisinopril     Unrecalled BP med- possibly lisinopril- intolerant, had cough on the medicine- changed to cozaar   Rice Nausea And Vomiting    History of Present Illness    Julie Phillips is an 88 year old female with a past medical history including persistent atrial fibrillation on chronic anticoagulation with Coumadin, hypertension, hyperlipidemia, hypothyroidism, frequent falls, and obesity.  She was initially diagnosed with atrial fibrillation in the setting of hypokalemia in 2007.  Echocardiogram at that time showed preserved EF with mild mitral and tricuspid regurgitation.  Stress testing revealed to be nonischemic.  She was initially placed on warfarin but this was discontinued due to frequent falls.  In February 2023 she was admitted to [facility] with chest pressure and mildly elevated high-sensitivity troponin in the setting of atrial fibrillation with RVR.  She reported at that time tachypalpitations dating back to around Christmas of 2022.  Echocardiogram during admission revealed EF of 65 to 70% without regional wall motion abnormalities, mild LVH, G2 2 DD, mildly dilated left atrium and mild MR and aortic valve sclerosis.  She reported improved balance at home.  In March for follow-up she was maintaining sinus rhythm on diltiazem and metoprolol.  She was scheduled for coronary CT angiography however upon presentation was noted to be in atrial fibrillation and the testing was canceled.  Diltiazem was titrated up to 180 mg daily however she did not tolerate the increased dose to continue to take the 120 mg daily.  Unfortunately April 21 she presented to my primary care with complaints of dyspnea and was found to be in A-fib with RVR in the 140s.  She was subsequently transferred to the [facility] emergency department  where BNP was found to be elevated at 696 and she was treated with intravenous Lasix.  She was placed on IV amiodarone and subsequently underwent successful cardioversion on April 21.  Amiodarone was transitioned to oral dosing and she was subsequently discharged home.  04/22/2022 she underwent coronary CT which revealed coronary calcium score of 902 which is a 49 percentile for age and sex matched control.  Normal coronary origin with right dominance, calcified plaque causing severe proximal to mid LAD stenosis (greater than 70%), calcified plaque in the moderate proximal left circumflex stenosis (50%), CAD-RADS 4 severe stenosis (70-99% or greater than 50% left main) cardiac catheterization was recommended.  She presented for an elective left heart catheterization on 05/06/2022 where she had second marginal lesion and 90% stenosed, mid LAD lesion is 90% stenosed, mid LAD to distal LAD stenosis of 20%.  She underwent successful DES placement to the mid LAD.  Cath revealed to severe vessel coronary artery disease 90% stenosis in the mid LAD and subtotal occlusion of the small OM 2, codominant coronary arteries, coronary arteries were moderately calcified overall, mildly elevated LVEDP, left ventricular angiography was not performed EF was normal by echocardiogram.  She was evaluated in clinic on 06/18/2022 by Dr. Rockey Situ with complaints of shortness of breath and exertional and occasional stabbing chest pains in the chest.  No changes were made to her medication at that time.  She was last seen in clinic on 09/21/2022 is working with complaints of shortness of breath.  She stated is gradually getting worse and when she was asleep at night she wakes up gasping for air.  She was sent for blood work which revealed an elevated D-dimer she was sent for a CT of the chest which revealed no evidence of acute pulmonary embolism or other acute vascular findings in the chest.  Also blood work that revealed an elevated BNP  of 398.8, hemoglobin 11.2 hematocrit of 34.6, and platelets of 310, serum creatinine 0.93, BUN of 18, GFR 59.  Her Lasix been increased from 20 mg daily to 40 mg a daily during her visit.  She returns clinic today with continued complaints of exertional dyspnea and occasional sharp stabbing chest pain.  Her stabbing chest pains have not changed in frequency or intensity.  She states that her peripheral edema has improved since increasing her Lasix at her last visit.  Continues to have altered sleep at night with the inability to sleep through the night.  When questioned about sleep apnea she stated it was brought on during her last hospitalization but nothing since that time.  She also denies any recent hospitalizations or visits to the emergency department.  Home Medications    Current Outpatient Medications  Medication Sig Dispense Refill   LOSARTAN POTASSIUM PO Apply 0.8 mg to eye in the morning, at noon, in the evening, and at bedtime.     prednisoLONE acetate (PRED FORTE) 1 % ophthalmic suspension Apply to eye.     amiodarone (PACERONE) 200 MG tablet Take  1 tablet (200 mg total) by mouth daily. 90 tablet 3   clopidogrel (PLAVIX) 75 MG tablet Take 1 tablet (75 mg total) by mouth daily with breakfast. 30 tablet 6   diltiazem (CARDIZEM CD) 120 MG 24 hr capsule Take 1 capsule (120 mg total) by mouth daily. 90 capsule 3   furosemide (LASIX) 20 MG tablet Take 1 tablet (20 mg total) by mouth 2 (two) times daily. 180 tablet 0   gabapentin (NEURONTIN) 100 MG capsule TAKE 1 CAPSULE BY MOUTH 3 TIMES DAILY ASNEEDED 270 capsule 3   levothyroxine (SYNTHROID) 100 MCG tablet TAKE 1 TABLET BY MOUTH ONCE A DAY BEFOREBREAKFAST. 90 tablet 3   losartan (COZAAR) 100 MG tablet Take 1 tablet (100 mg total) by mouth daily. 90 tablet 3   metoprolol succinate (TOPROL-XL) 50 MG 24 hr tablet TAKE 1 TABLET BY MOUTH TWICE A DAY. TAKEWITH FOOD 180 tablet 0   nitroGLYCERIN (NITROSTAT) 0.4 MG SL tablet Place 1 tablet (0.4 mg  total) under the tongue every 5 (five) minutes as needed for chest pain. 25 tablet 3   pantoprazole (PROTONIX) 20 MG tablet Take 1 tablet (20 mg total) by mouth daily. 90 tablet 3   Polyethyl Glycol-Propyl Glycol (SYSTANE) 0.4-0.3 % GEL ophthalmic gel Place 1 application into both eyes.     potassium chloride (KLOR-CON) 10 MEQ tablet TAKE 1 TABLET BY MOUTH ONCE A DAY 90 tablet 1   warfarin (COUMADIN) 5 MG tablet TAKE 1 TABLET (5 MG) BY MOUTH DAILY EXCEPT TAKE 1/2 TABLET (2.5 MG) ON MONDAYS AND THURSDAYS OR AS DIRECTED BY ANTICOAGULATION CLINIC 110 tablet 1   No current facility-administered medications for this visit.     Family History    Family History  Problem Relation Age of Onset   Heart disease Mother    Heart disease Father    Breast cancer Sister    Breast cancer Daughter    Colon cancer Neg Hx    She indicated that her mother is deceased. She indicated that her father is deceased. She indicated that her sister is alive. She indicated that three of her four brothers are alive. She indicated that her daughter is deceased. She indicated that the status of her neg hx is unknown.  Social History    Social History   Socioeconomic History   Marital status: Widowed    Spouse name: Not on file   Number of children: Not on file   Years of education: Not on file   Highest education level: Not on file  Occupational History   Occupation: Retired Armed forces technical officer: RETIRED  Tobacco Use   Smoking status: Former    Packs/day: 2.00    Years: 20.00    Total pack years: 40.00    Types: Cigarettes    Quit date: 12/14/1971    Years since quitting: 50.8   Smokeless tobacco: Never  Vaping Use   Vaping Use: Never used  Substance and Sexual Activity   Alcohol use: Not Currently   Drug use: No   Sexual activity: Not Currently  Other Topics Concern   Not on file  Social History Narrative   Divorced   Education:  [employer]   8 pregnancies, 6 live births   LMP: 1985    Lives in independent living.     Daughter died 2022-02-25   Social Determinants of Health   Financial Resource Strain: Low Risk  (06/24/2022)   Overall Financial Resource Strain (CARDIA)    Difficulty  of Paying Living Expenses: Not hard at all  Food Insecurity: No Food Insecurity (06/24/2022)   Hunger Vital Sign    Worried About Running Out of Food in the Last Year: Never true    Ran Out of Food in the Last Year: Never true  Transportation Needs: No Transportation Needs (07/09/2019)   PRAPARE - Hydrologist (Medical): No    Lack of Transportation (Non-Medical): No  Physical Activity: Inactive (06/24/2022)   Exercise Vital Sign    Days of Exercise per Week: 0 days    Minutes of Exercise per Session: 0 min  Stress: No Stress Concern Present (06/24/2022)   [place]    Feeling of Stress : Not at all  Social Connections: Not on file  Intimate Partner Violence: Not At Risk (07/09/2019)   Humiliation, Afraid, Rape, and Kick questionnaire    Fear of Current or Ex-Partner: No    Emotionally Abused: No    Physically Abused: No    Sexually Abused: No     Review of Systems    General:  No chills, fever, night sweats or weight changes.  Endorses fatigue Cardiovascular: Endorses chest pain, dyspnea on exertion, edema, but does orthopnea, palpitations, paroxysmal nocturnal dyspnea. Dermatological: No rash, lesions/masses Respiratory: No cough, dorsalis dyspnea Urologic: No hematuria, dysuria Abdominal:   No nausea, vomiting, diarrhea, bright red blood per rectum, melena, or hematemesis Neurologic:  No visual changes, wkns, changes in mental status. All other systems reviewed and are otherwise negative except as noted above.   Physical Exam    VS:  BP (!) 122/52 (BP Location: Left Arm, Patient Position: Sitting, Cuff Size: Large)   Pulse 60   Ht '5\' 1"'$  (1.549 m)   Wt 229 lb (103.9 kg)   SpO2 94%    BMI 43.27 kg/m  , BMI Body mass index is 43.27 kg/m. STOP-Bang Score:  6      GEN: Well nourished, well developed, in no acute distress. HEENT: normal. Neck: Supple, no JVD, carotid bruits, or masses. Cardiac: RRR, I/VI  murmur, without rubs, or gallops. No clubbing, cyanosis, trace pretibial edema.  Radials/DP/PT 2+ and equal bilaterally.  Respiratory:  Respirations regular and unlabored, clear to auscultation bilaterally. GI: Soft, nontender, nondistended, BS + x 4. MS: no deformity or atrophy. Skin: warm and dry, no rash. Neuro:  Strength and sensation are intact. Psych: Normal affect.  Accessory Clinical Findings    ECG personally reviewed by me today-sinus rhythm rate of 60 nonspecific T wave abnormalities and LVH- No acute changes  Lab Results  Component Value Date   WBC 9.7 09/21/2022   HGB 11.2 (L) 09/21/2022   HCT 34.6 (L) 09/21/2022   MCV 88.3 09/21/2022   PLT 310 09/21/2022   Lab Results  Component Value Date   CREATININE 0.93 09/21/2022   BUN 18 09/21/2022   NA 140 09/21/2022   K 3.9 09/21/2022   CL 104 09/21/2022   CO2 25 09/21/2022   Lab Results  Component Value Date   ALT 10 04/01/2022   AST 15 04/01/2022   ALKPHOS 91 04/01/2022   BILITOT 0.8 04/01/2022   Lab Results  Component Value Date   CHOL 238 (H) 08/12/2022   HDL 54.40 08/12/2022   LDLCALC 161 (H) 08/12/2022   LDLDIRECT 152.6 12/31/2013   TRIG 112.0 08/12/2022   CHOLHDL 4 08/12/2022    No results found for: "HGBA1C"  Assessment & Plan   1.  Persistent atrial fibrillation status post cardioversion currently sinus rhythm with a rate of 60 on EKG.  She has been continued on amiodarone 200 mg daily, diltiazem 120 mg daily, Toprol-XL 50 mg twice daily, and warfarin.  CHA2DS2-VASc score of at least 5 (sex, age, hypertension, previous MI).  2.  Continued shortness of breath with occasional PND.  Her Lasix was increased at last visit as her BNP was mildly elevated as well as a D-dimer.  CT of  the chest was negative for PE.  She states that increasing her furosemide has improved the peripheral edema that she has been suffering from.  Should have a follow-up BMP in 2 weeks after having furosemide increased. she has been sent for pulmonary consultation as well as her STOP-BANG score of 6  3.  Sleep disordered breathing patient notes history of awakened suddenly, feeling startled, and then hyperventilating.  She eventually is able to fall back asleep on occasion.  She is states that she is never been tested for sleep apnea and is interested in pulmonary referral for sleep study.  Her STOP-BANG score equals 6.  She has been sent for pulmonary consultation.  4.  Essential hypertension with blood pressure today of 122/52.  Blood pressure remained stable.  She is continued on her current medication regimen without changes at this time.  She does require refill of her furosemide sent to the pharmacy of choice.  5.  Hyperlipidemia with a last LDL of 161 08/02/2022.  She has statin intolerance.  Currently she is not interested in starting any new medications at this time and is trying to determine which medication she can stop.  We will discuss starting  PSK 9 inhibitor therapy on her return.  6.  Coronary artery disease with PCI/DES to the mid LAD in 04/2022.  She continues to have stable angina and shortness of breath which may need to be considered as chest pain equivalent after she has followed up with pulmonary.  She is continued on clopidogrel and warfarin in place of aspirin.  She has been resistant to taking any new medications at this time.  She has a known history of statin intolerance and was unable to tolerate recent trial of Zetia due to intolerance and short extreme fatigue.  7.  Disposition patient return to clinic to see MD/APP in 3 months or sooner if needed   Harmoney Sienkiewicz, NP 09/30/2022, 3:29 PM

## 2022-09-30 ENCOUNTER — Ambulatory Visit: Payer: PPO | Attending: Cardiology | Admitting: Cardiology

## 2022-09-30 ENCOUNTER — Encounter: Payer: Self-pay | Admitting: Cardiology

## 2022-09-30 VITALS — BP 122/52 | HR 60 | Ht 61.0 in | Wt 229.0 lb

## 2022-09-30 DIAGNOSIS — I5032 Chronic diastolic (congestive) heart failure: Secondary | ICD-10-CM | POA: Diagnosis not present

## 2022-09-30 DIAGNOSIS — I25118 Atherosclerotic heart disease of native coronary artery with other forms of angina pectoris: Secondary | ICD-10-CM

## 2022-09-30 DIAGNOSIS — R0602 Shortness of breath: Secondary | ICD-10-CM | POA: Diagnosis not present

## 2022-09-30 DIAGNOSIS — G4730 Sleep apnea, unspecified: Secondary | ICD-10-CM | POA: Diagnosis not present

## 2022-09-30 DIAGNOSIS — I10 Essential (primary) hypertension: Secondary | ICD-10-CM | POA: Diagnosis not present

## 2022-09-30 DIAGNOSIS — I480 Paroxysmal atrial fibrillation: Secondary | ICD-10-CM | POA: Diagnosis not present

## 2022-09-30 DIAGNOSIS — E782 Mixed hyperlipidemia: Secondary | ICD-10-CM | POA: Diagnosis not present

## 2022-09-30 MED ORDER — FUROSEMIDE 20 MG PO TABS: 20.0000 mg | ORAL_TABLET | Freq: Two times a day (BID) | ORAL | 0 refills | Status: DC

## 2022-09-30 NOTE — Patient Instructions (Addendum)
Medication Instructions:   Your physician recommends that you continue on your current medications as directed. Please refer to the Current Medication list given to you today.  *If you need a refill on your cardiac medications before your next appointment, please call your pharmacy*   Lab Work:  Your physician recommends that you return for lab work in: 2 weeks please come to [place] to have your labs drawn.  -BNP  If you have labs (blood work) drawn today and your tests are completely normal, you will receive your results only by: MyChart Message (if you have MyChart) OR A paper copy in the mail If you have any lab test that is abnormal or we need to change your treatment, we will call you to review the results.   Testing/Procedures:  None   Follow-Up: At [facility], you and your health needs are our priority.  As part of our continuing mission to provide you with exceptional heart care, we have created designated Provider Care Teams.  These Care Teams include your primary Cardiologist (physician) and Advanced Practice Providers (APPs -  Physician Assistants and Nurse Practitioners) who all work together to provide you with the care you need, when you need it.  We recommend signing up for the patient portal called "MyChart".  Sign up information is provided on this After Visit Summary.  MyChart is used to connect with patients for Virtual Visits (Telemedicine).  Patients are able to view lab/test results, encounter notes, upcoming appointments, etc.  Non-urgent messages can be sent to your provider as well.   To learn more about what you can do with MyChart, go to [email or web address].    Your next appointment:   3 month(s)  The format for your next appointment:   In Person  Provider:   You may see Ida Rogue, MD or one of the following Advanced Practice Providers on your designated Care Team:   Murray Hodgkins, NP Christell Faith, PA-C Cadence Kathlen Mody,  PA-C Gerrie Nordmann, NP  Important Information About Sugar

## 2022-10-01 ENCOUNTER — Ambulatory Visit (INDEPENDENT_AMBULATORY_CARE_PROVIDER_SITE_OTHER): Payer: PPO | Admitting: Family Medicine

## 2022-10-01 ENCOUNTER — Encounter: Payer: Self-pay | Admitting: Family Medicine

## 2022-10-01 VITALS — BP 102/60 | HR 68 | Temp 97.5°F | Ht 61.0 in | Wt 226.0 lb

## 2022-10-01 DIAGNOSIS — F4321 Adjustment disorder with depressed mood: Secondary | ICD-10-CM

## 2022-10-01 DIAGNOSIS — Z23 Encounter for immunization: Secondary | ICD-10-CM | POA: Diagnosis not present

## 2022-10-01 MED ORDER — TRAZODONE HCL 50 MG PO TABS: 25.0000 mg | ORAL_TABLET | Freq: Every evening | ORAL | Status: DC | PRN

## 2022-10-01 MED ORDER — TRAZODONE HCL 50 MG PO TABS: 25.0000 mg | ORAL_TABLET | Freq: Every evening | ORAL | 1 refills | Status: DC | PRN

## 2022-10-01 NOTE — Progress Notes (Unsigned)
Her daughter died, condolences offered. She had liver cancer.  She is at home.  Nights are tough for patient.  Trouble staying asleep.  Waking at night.  Her days are tough because of fatigue from sleep disruption.  She is in counseling.  No SI/HI.  Meds, vitals, and allergies reviewed.   ROS: Per HPI unless specifically indicated in ROS section   GEN: nad, alert and oriented,  Tearful, regains composure.  HEENT: ncat NECK: supple w/o LA CV: rrr. PULM: ctab, no inc wob ABD: soft, +bs SKIN: well perfused.

## 2022-10-01 NOTE — Patient Instructions (Signed)
Use trazodone if needed.  Update me as needed.  Take care.  Glad to see you.

## 2022-10-03 DIAGNOSIS — F4321 Adjustment disorder with depressed mood: Secondary | ICD-10-CM | POA: Insufficient documentation

## 2022-10-03 NOTE — Assessment & Plan Note (Signed)
Condolences offered.  Continue with counseling.  Okay for outpatient follow-up. Use trazodone if needed.  Rationale for use and routine cautions discussed with patient. Update me as needed.  She agrees with plan.

## 2022-10-06 ENCOUNTER — Encounter: Payer: Self-pay | Admitting: Primary Care

## 2022-10-06 ENCOUNTER — Ambulatory Visit (INDEPENDENT_AMBULATORY_CARE_PROVIDER_SITE_OTHER): Payer: PPO | Admitting: Primary Care

## 2022-10-06 VITALS — BP 120/60 | HR 60 | Temp 98.0°F | Ht 60.0 in | Wt 230.0 lb

## 2022-10-06 DIAGNOSIS — R0683 Snoring: Secondary | ICD-10-CM

## 2022-10-06 DIAGNOSIS — R062 Wheezing: Secondary | ICD-10-CM | POA: Diagnosis not present

## 2022-10-06 DIAGNOSIS — I4811 Longstanding persistent atrial fibrillation: Secondary | ICD-10-CM | POA: Diagnosis not present

## 2022-10-06 DIAGNOSIS — R29818 Other symptoms and signs involving the nervous system: Secondary | ICD-10-CM | POA: Diagnosis not present

## 2022-10-06 MED ORDER — FAMOTIDINE 20 MG PO TABS: 20.0000 mg | ORAL_TABLET | Freq: Every day | ORAL | 0 refills | Status: DC

## 2022-10-06 NOTE — Progress Notes (Signed)
Reviewed and agree with assessment/plan.   Chesley Mires, MD [facility] Pulmonary/Critical Care 10/06/2022, 2:50 PM Pager:  [phone]

## 2022-10-06 NOTE — Assessment & Plan Note (Signed)
-   Regular rate and rhythm - Continue Diltiazem, Toprol xl and coumadin

## 2022-10-06 NOTE — Patient Instructions (Addendum)
Recommendations: Follow GERD diet Elevate head of bed at night while sleeping Continue pantoprazole '20mg'$  in the morning  Start famotidine '20mg'$  at bedtime   Orders: Home sleep study re: snoring   Follow-up: 6-8 weeks in [place] to review sleep study results/treatment options    Food Choices for Gastroesophageal Reflux Disease, Adult When you have gastroesophageal reflux disease (GERD), the foods you eat and your eating habits are very important. Choosing the right foods can help ease your discomfort. Think about working with a food expert (dietitian) to help you make good choices. What are tips for following this plan? Reading food labels Look for foods that are low in saturated fat. Foods that may help with your symptoms include: Foods that have less than 5% of daily value (DV) of fat. Foods that have 0 grams of trans fat. Cooking Do not fry your food. Cook your food by baking, steaming, grilling, or broiling. These are all methods that do not need a lot of fat for cooking. To add flavor, try to use herbs that are low in spice and acidity. Meal planning  Choose healthy foods that are low in fat, such as: Fruits and vegetables. Whole grains. Low-fat dairy products. Lean meats, fish, and poultry. Eat small meals often instead of eating 3 large meals each day. Eat your meals slowly in a place where you are relaxed. Avoid bending over or lying down until 2-3 hours after eating. Limit high-fat foods such as fatty meats or fried foods. Limit your intake of fatty foods, such as oils, butter, and shortening. Avoid the following as told by your doctor: Foods that cause symptoms. These may be different for different people. Keep a food diary to keep track of foods that cause symptoms. Alcohol. Drinking a lot of liquid with meals. Eating meals during the 2-3 hours before bed. Lifestyle Stay at a healthy weight. Ask your doctor what weight is healthy for you. If you need to lose  weight, work with your doctor to do so safely. Exercise for at least 30 minutes on 5 or more days each week, or as told by your doctor. Wear loose-fitting clothes. Do not smoke or use any products that contain nicotine or tobacco. If you need help quitting, ask your doctor. Sleep with the head of your bed higher than your feet. Use a wedge under the mattress or blocks under the bed frame to raise the head of the bed. Chew sugar-free gum after meals. What foods should eat?  Eat a healthy, well-balanced diet of fruits, vegetables, whole grains, low-fat dairy products, lean meats, fish, and poultry. Each person is different. Foods that may cause symptoms in one person may not cause any symptoms in another person. Work with your doctor to find foods that are safe for you. The items listed above may not be a complete list of what you can eat and drink. Contact a food expert for more options. What foods should I avoid? Limiting some of these foods may help in managing the symptoms of GERD. Everyone is different. Talk with a food expert or your doctor to help you find the exact foods to avoid, if any. Fruits Any fruits prepared with added fat. Any fruits that cause symptoms. For some people, this may include citrus fruits, such as oranges, grapefruit, pineapple, and lemons. Vegetables Deep-fried vegetables. [place] fries. Any vegetables prepared with added fat. Any vegetables that cause symptoms. For some people, this may include tomatoes and tomato products, chili peppers, onions and garlic, and  horseradish. Grains Pastries or quick breads with added fat. Meats and other proteins High-fat meats, such as fatty beef or pork, hot dogs, ribs, ham, sausage, salami, and bacon. Fried meat or protein, including fried fish and fried chicken. Nuts and nut butters, in large amounts. Dairy Whole milk and chocolate milk. Sour cream. Cream. Ice cream. Cream cheese. Milkshakes. Fats and oils Butter. Margarine.  Shortening. Ghee. Beverages Coffee and tea, with or without caffeine. Carbonated beverages. Sodas. Energy drinks. Fruit juice made with acidic fruits, such as orange or grapefruit. Tomato juice. Alcoholic drinks. Sweets and desserts Chocolate and cocoa. Donuts. Seasonings and condiments Pepper. Peppermint and spearmint. Added salt. Any condiments, herbs, or seasonings that cause symptoms. For some people, this may include curry, hot sauce, or vinegar-based salad dressings. The items listed above may not be a complete list of what you should not eat and drink. Contact a food expert for more options. Questions to ask your doctor Diet and lifestyle changes are often the first steps that are taken to manage symptoms of GERD. If diet and lifestyle changes do not help, talk with your doctor about taking medicines. Where to find more information International Foundation for Gastrointestinal Disorders: aboutgerd.org Summary When you have GERD, food and lifestyle choices are very important in easing your symptoms. Eat small meals often instead of 3 large meals a day. Eat your meals slowly and in a place where you are relaxed. Avoid bending over or lying down until 2-3 hours after eating. Limit high-fat foods such as fatty meats or fried foods. This information is not intended to replace advice given to you by your health care provider. Make sure you discuss any questions you have with your health care provider. Document Revised: 06/09/2020 Document Reviewed: 06/09/2020 Elsevier Patient Education  [employer].

## 2022-10-06 NOTE — Progress Notes (Signed)
$'@Patient'O$  ID: Julie Phillips, female    DOB: 22-Apr-1932, 88 y.o.   MRN: [id number]  Chief Complaint  Patient presents with   sleep consult    No prior sleep study. C/o daytime sleepiness, restless sleep and gasping for air during sleep.    Referring provider: Gerrie Nordmann, NP  HPI: 88 year old female, former smoker. PMH significant for GERD, Afib, CAD, HTN, LVH, osteopenia, vitamin D deficiency.  10/06/2022 Patient presents today for sleep consult. Referred by cardiology d/t afib. She has symptoms of waking up suddenly at night gasping for air and hyperventilating. She is unsure if she snores. Her sleep is very restless. She goes to bed between 11pm-1am. She has no difficulty falling asleep but has trouble staying asleep. She wakes up 2-4 times a night. She starts her day between 6:30-7:30am. No previous sleep studies. Denies symptoms of narcolepsy, cataplexy or sleep walking.   She has no trouble breathing during the day, shortness of breath and wheezing symptoms only happens at night. CTA was negative for PE. Furosemide was recently increased by cardiology. Lower extremity swelling has improved.   Sleep questionnaire Symptoms-  Struggling to breath at night, waking up gasping for air, restless sleep, daytime sleepiness  Prior sleep study- None  Bedtime-11pm-1am Time to fall asleep- 15 mins Nocturnal awakenings- 2-4 times  Out of bed/start of day-  6:30am  Weight changes- down 10 lbs Do you operate heavy machinery- no Do you currently wear CPAP- no Do you current wear oxygen- no Epworth- 9   Allergies  Allergen Reactions   Statins     Myalgias   Wheat     Throat swelling   Ezetimibe Other (See Comments)    Extreme fatigue   Jardiance [Empagliflozin] Other (See Comments)    Dry mouth, "gasping for breath"   Amlodipine     hematuria   Eucalyptus Oil    Lisinopril     Unrecalled BP med- possibly lisinopril- intolerant, had cough on the medicine- changed to cozaar    Rice Nausea And Vomiting    Immunization History  Administered Date(s) Administered   Fluad Quad(high Dose 65+) 09/11/2019, 11/09/2019, 10/01/2022   Influenza Split 10/04/2012   Influenza, High Dose Seasonal PF 10/19/2021   Influenza,inj,Quad PF,6+ Mos 10/08/2013, 09/25/2015, 09/14/2016, 10/06/2017   Influenza-Unspecified 09/13/2011, 08/13/2014, 10/13/2018   PFIZER(Purple Top)SARS-COV-2 Vaccination 01/03/2020, 01/21/2020, 09/11/2020, 04/18/2021   Pneumococcal Conjugate-13 08/13/2014   Pneumococcal Polysaccharide-23 12/13/2010   Td 12/13/2010    Past Medical History:  Diagnosis Date   Arrhythmia    atrial fibrillation   CHF (congestive heart failure) (HCC)    Chronic heart failure with preserved ejection fraction (HFpEF) ([place])    a. 01/2022 Echo: EF 65-70%, no rwma, mild LVH, GrII DD, nl RV fxn, mildly dil LA, mild MR, ao scelrosis.   Fall    GERD (gastroesophageal reflux disease)    History of chicken pox    Hx of colonic polyps    Hyperlipidemia    Hypertension    Hypothyroidism    Keratopathy 2018   R eye   LVH (left ventricular hypertrophy)    Persistent atrial fibrillation ([place])    a. Initially dx in 2012; b. 01/2022 recurrent AF rvr noted-->warfarin added (CHA2DS2VASc = 5); c. 03/2022 Amio added-->DCCV x 1 (150J).    Tobacco History: Social History   Tobacco Use  Smoking Status Former   Packs/day: 2.00   Years: 20.00   Total pack years: 40.00   Types: Cigarettes   Quit  date: 12/14/1971   Years since quitting: 50.8  Smokeless Tobacco Never   Counseling given: Not Answered   Outpatient Medications Prior to Visit  Medication Sig Dispense Refill   amiodarone (PACERONE) 200 MG tablet Take 1 tablet (200 mg total) by mouth daily. 90 tablet 3   clopidogrel (PLAVIX) 75 MG tablet Take 1 tablet (75 mg total) by mouth daily with breakfast. 30 tablet 6   diltiazem (CARDIZEM CD) 120 MG 24 hr capsule Take 1 capsule (120 mg total) by mouth daily. 90 capsule 3   furosemide  (LASIX) 20 MG tablet Take 1 tablet (20 mg total) by mouth 2 (two) times daily. 180 tablet 0   gabapentin (NEURONTIN) 100 MG capsule TAKE 1 CAPSULE BY MOUTH 3 TIMES DAILY ASNEEDED 270 capsule 3   levothyroxine (SYNTHROID) 100 MCG tablet TAKE 1 TABLET BY MOUTH ONCE A DAY BEFOREBREAKFAST. 90 tablet 3   losartan (COZAAR) 100 MG tablet Take 1 tablet (100 mg total) by mouth daily. 90 tablet 3   LOSARTAN POTASSIUM PO Apply 0.8 mg to eye in the morning, at noon, in the evening, and at bedtime.     metoprolol succinate (TOPROL-XL) 50 MG 24 hr tablet TAKE 1 TABLET BY MOUTH TWICE A DAY. TAKEWITH FOOD 180 tablet 0   nitroGLYCERIN (NITROSTAT) 0.4 MG SL tablet Place 1 tablet (0.4 mg total) under the tongue every 5 (five) minutes as needed for chest pain. 25 tablet 3   pantoprazole (PROTONIX) 20 MG tablet Take 1 tablet (20 mg total) by mouth daily. 90 tablet 3   Polyethyl Glycol-Propyl Glycol (SYSTANE) 0.4-0.3 % GEL ophthalmic gel Place 1 application into both eyes.     potassium chloride (KLOR-CON) 10 MEQ tablet TAKE 1 TABLET BY MOUTH ONCE A DAY 90 tablet 1   prednisoLONE acetate (PRED FORTE) 1 % ophthalmic suspension Apply to eye.     traZODone (DESYREL) 50 MG tablet Take 0.5-1 tablets (25-50 mg total) by mouth at bedtime as needed for sleep. Hold for patient request. 30 tablet 1   warfarin (COUMADIN) 5 MG tablet TAKE 1 TABLET (5 MG) BY MOUTH DAILY EXCEPT TAKE 1/2 TABLET (2.5 MG) ON MONDAYS AND THURSDAYS OR AS DIRECTED BY ANTICOAGULATION CLINIC 110 tablet 1   No facility-administered medications prior to visit.   Review of Systems  Review of Systems  Constitutional:  Positive for fatigue.  HENT: Negative.    Respiratory:  Negative for cough and shortness of breath.        Nocturnal wheezing  Psychiatric/Behavioral:  Positive for sleep disturbance.     Physical Exam  BP 120/60 (BP Location: Left Wrist, Cuff Size: Normal)   Pulse 60   Temp 98 F (36.7 C) (Temporal)   Ht 5' (1.524 m)   Wt 230 lb  (104.3 kg)   SpO2 98%   BMI 44.92 kg/m  Physical Exam Constitutional:      Appearance: Normal appearance.  HENT:     Head: Normocephalic and atraumatic.  Cardiovascular:     Rate and Rhythm: Normal rate and regular rhythm.     Comments: RRR; +1 BLE edema, non-pitting Pulmonary:     Effort: Pulmonary effort is normal.     Breath sounds: No wheezing or rhonchi.     Comments: CTA Musculoskeletal:        General: Normal range of motion.  Skin:    General: Skin is warm and dry.  Neurological:     General: No focal deficit present.     Mental Status: She  is alert and oriented to person, place, and time. Mental status is at baseline.  Psychiatric:        Mood and Affect: Mood normal.        Behavior: Behavior normal.        Thought Content: Thought content normal.        Judgment: Judgment normal.      Lab Results:  CBC    Component Value Date/Time   WBC 9.7 09/21/2022 1631   RBC 3.92 09/21/2022 1631   HGB 11.2 (L) 09/21/2022 1631   HGB 12.5 03/25/2020 1637   HCT 34.6 (L) 09/21/2022 1631   HCT 37.4 03/25/2020 1637   PLT 310 09/21/2022 1631   PLT 312 03/25/2020 1637   MCV 88.3 09/21/2022 1631   MCV 87 03/25/2020 1637   MCH 28.6 09/21/2022 1631   MCHC 32.4 09/21/2022 1631   RDW 15.8 (H) 09/21/2022 1631   RDW 13.4 03/25/2020 1637   LYMPHSABS 1.3 08/12/2022 1109   MONOABS 0.6 08/12/2022 1109   EOSABS 0.3 08/12/2022 1109   BASOSABS 0.1 08/12/2022 1109    BMET    Component Value Date/Time   NA 140 09/21/2022 1631   NA 140 03/25/2020 1637   K 3.9 09/21/2022 1631   CL 104 09/21/2022 1631   CO2 25 09/21/2022 1631   GLUCOSE 118 (H) 09/21/2022 1631   BUN 18 09/21/2022 1631   BUN 10 03/25/2020 1637   CREATININE 0.93 09/21/2022 1631   CREATININE 0.73 07/10/2021 1508   CALCIUM 9.0 09/21/2022 1631   GFRNONAA 59 (L) 09/21/2022 1631   GFRAA 90 03/25/2020 1637    BNP    Component Value Date/Time   BNP 398.8 (H) 09/21/2022 1631    ProBNP No results found for:  "PROBNP"  Imaging: CT ANGIO CHEST AORTA W/CM & OR WO/CM  Result Date: 09/22/2022 CLINICAL DATA:  Pulmonary embolism suspected. Elevated D-dimer levels. EXAM: CT ANGIOGRAPHY CHEST WITH CONTRAST TECHNIQUE: Multidetector CT imaging of the chest was performed using the standard protocol during bolus administration of intravenous contrast. Multiplanar CT image reconstructions and MIPs were obtained to evaluate the vascular anatomy. RADIATION DOSE REDUCTION: This exam was performed according to the departmental dose-optimization program which includes automated exposure control, adjustment of the mA and/or kV according to patient size and/or use of iterative reconstruction technique. CONTRAST:  66m OMNIPAQUE IOHEXOL 350 MG/ML SOLN COMPARISON:  Cardiac CT 04/22/2022. FINDINGS: Cardiovascular: The pulmonary arteries are well opacified with contrast to the level of the subsegmental branches. There is no evidence of acute pulmonary embolism. Limited systemic arterial opacification. Diffuse atherosclerosis of the aorta, great vessels and coronary arteries. There is a stent in the left anterior descending coronary artery. No acute systemic arterial abnormalities are identified. Mitral annular calcifications are noted. The heart size is normal. There is no pericardial effusion. Mediastinum/Nodes: There are no enlarged mediastinal, hilar or axillary lymph nodes.Small hiatal hernia. The thyroid gland and trachea appear unremarkable. Lungs/Pleura: No pleural effusion or pneumothorax. Mild centrilobular emphysema and biapical scarring. No suspicious pulmonary nodules. Upper abdomen: No significant findings are seen within the visualized upper abdomen. Musculoskeletal/Chest wall: There is no chest wall mass or suspicious osseous finding. Multilevel spondylosis. Review of the MIP images confirms the above findings. IMPRESSION: 1. No evidence of acute pulmonary embolism or other acute vascular findings in the chest. 2. Small  hiatal hernia. 3. Aortic Atherosclerosis (ICD10-I70.0) and Emphysema (ICD10-J43.9). Electronically Signed   By: WRichardean SaleM.D.   On: 09/22/2022 15:37   DG Chest  2 View  Result Date: 09/21/2022 CLINICAL DATA:  Provided history: Shortness of breath/PND. EXAM: CHEST - 2 VIEW COMPARISON:  Prior chest radiographs 04/01/2022 and earlier. FINDINGS: Heart size within normal limits. Aortic atherosclerosis. Mitral annular calcifications. Central pulmonary vascular congestion without overt pulmonary edema. Small focus of linear atelectasis or scarring within the lateral left lung base. No appreciable airspace consolidation. No evidence of pleural effusion or pneumothorax. No acute bony abnormality identified. IMPRESSION: Central pulmonary vascular congestion without overt pulmonary edema. Small focus of linear atelectasis or scarring within the lateral left lung base. Mitral annular calcifications. Aortic Atherosclerosis (ICD10-I70.0). Electronically Signed   By: Kellie Simmering D.O.   On: 09/21/2022 16:31     Assessment & Plan:   Suspected sleep apnea - Patient has symptoms of disrupted/restless sleep and reports waking up gasping for air at night. Unsure if she snores. He has a history of afib. Epworth score 9. BMI 44. I have a moderate-high suspicion that patient has underlying sleep apnea. Needs home sleep study to evaluate. Reviewed risks of untreated sleep apnea including cardiac arrhythmias, pulm HTN, stroke and diabetes. We discussed treatment options including weight loss, oral appliance, CPAP or referral to ENT for possible surgical options. She is open to CPAP treatment if needed. Encourage side sleeping position or elevate head of bed at night. FU 1-2 weeks after sleep study to review results and treatment options if needed.   Wheezing - Patient has symptoms of nocturnal wheeze. She has no daytime shortness of breath or respiratory symptoms. Wheezing likely related to underlying reflux. Advised she  take pantoprazole '20mg'$  daily in the morning and start famotidine '20mg'$  at bedtime. Encourage she follow-up GERD diet and sleep with head elevate.   Atrial fibrillation (HCC) - Regular rate and rhythm - Continue Diltiazem, Toprol xl and coumadin     Martyn Ehrich, NP 10/06/2022

## 2022-10-06 NOTE — Assessment & Plan Note (Signed)
-   Patient has symptoms of disrupted/restless sleep and reports waking up gasping for air at night. Unsure if she snores. He has a history of afib. Epworth score 9. BMI 44. I have a moderate-high suspicion that patient has underlying sleep apnea. Needs home sleep study to evaluate. Reviewed risks of untreated sleep apnea including cardiac arrhythmias, pulm HTN, stroke and diabetes. We discussed treatment options including weight loss, oral appliance, CPAP or referral to ENT for possible surgical options. She is open to CPAP treatment if needed. Encourage side sleeping position or elevate head of bed at night. FU 1-2 weeks after sleep study to review results and treatment options if needed.

## 2022-10-06 NOTE — Assessment & Plan Note (Signed)
-   Patient has symptoms of nocturnal wheeze. She has no daytime shortness of breath or respiratory symptoms. Wheezing likely related to underlying reflux. Advised she take pantoprazole '20mg'$  daily in the morning and start famotidine '20mg'$  at bedtime. Encourage she follow-up GERD diet and sleep with head elevate.

## 2022-10-09 ENCOUNTER — Other Ambulatory Visit: Payer: Self-pay | Admitting: Cardiovascular Disease

## 2022-10-14 ENCOUNTER — Ambulatory Visit: Payer: PPO | Admitting: Clinical

## 2022-10-15 ENCOUNTER — Ambulatory Visit: Payer: PPO | Admitting: Medical

## 2022-10-18 ENCOUNTER — Other Ambulatory Visit: Payer: Self-pay | Admitting: Cardiology

## 2022-10-18 ENCOUNTER — Telehealth: Payer: Self-pay | Admitting: Cardiovascular Disease

## 2022-10-18 NOTE — Telephone Encounter (Signed)
Pt c/o medication issue:  1. Name of Medication: metoprolol succinate (TOPROL-XL) 50 MG 24 hr tablet   2. How are you currently taking this medication (dosage and times per day)?   3. Are you having a reaction (difficulty breathing--STAT)?   4. What is your medication issue? Pharmacy would like a call back to get clarification on this medication and if it is suppose to be taken 2x per day.

## 2022-10-18 NOTE — Telephone Encounter (Signed)
Called pharmacy and verified pt should be taking Metoprolol Succinate 50 mg BID and losartan 100 mg daily. Pharmacist stated pt reported BP has been running low, however was 159/89 when pharmacist checked. She stated pt reported BP will tend to run higher after walking long distances.  Nurse contacted pt. She report BP was 85/40 2 weeks ago but has since been running 110-120/70 in the morning. Pt reported she's been extremely tired but unable to sleep at night  Will forward to NP for review.

## 2022-10-19 NOTE — Telephone Encounter (Signed)
Gerrie Nordmann, NP  Sent: Tue October 19, 2022  7:53 AM  To: Meryl Crutch, RN         Message  There were some issues with her medications previously. Can we schedule her to come back in earlier? Like the next few weeks? Ask her to bring in her home blood pressure readings and her current medication list. She should also be following up with pulmonary for sleep apnea.

## 2022-10-19 NOTE — Telephone Encounter (Signed)
The information on blood pressures was different encounter.   Called and spoke with patient to schedule her appointment. We were able to schedule for this Friday and she verbalized understanding to bring in BP readings and she reports that she has a service that brings her daily medications and they match what is in the system. Encouraged her to try and bring one if possible. She verbalized understanding with no further questions at this time.

## 2022-10-21 ENCOUNTER — Ambulatory Visit: Payer: PPO

## 2022-10-22 ENCOUNTER — Encounter: Payer: Self-pay | Admitting: Cardiology

## 2022-10-22 ENCOUNTER — Ambulatory Visit: Payer: PPO | Attending: Cardiology | Admitting: Cardiology

## 2022-10-22 VITALS — BP 122/44 | HR 56 | Ht 60.0 in | Wt 229.4 lb

## 2022-10-22 DIAGNOSIS — I25118 Atherosclerotic heart disease of native coronary artery with other forms of angina pectoris: Secondary | ICD-10-CM | POA: Diagnosis not present

## 2022-10-22 DIAGNOSIS — E782 Mixed hyperlipidemia: Secondary | ICD-10-CM | POA: Diagnosis not present

## 2022-10-22 DIAGNOSIS — I480 Paroxysmal atrial fibrillation: Secondary | ICD-10-CM | POA: Diagnosis not present

## 2022-10-22 DIAGNOSIS — I10 Essential (primary) hypertension: Secondary | ICD-10-CM

## 2022-10-22 DIAGNOSIS — G4730 Sleep apnea, unspecified: Secondary | ICD-10-CM

## 2022-10-22 DIAGNOSIS — R0602 Shortness of breath: Secondary | ICD-10-CM | POA: Diagnosis not present

## 2022-10-22 NOTE — Progress Notes (Signed)
Cardiology Clinic Note   Patient Name: Julie Phillips Date of Encounter: 10/22/2022  Primary Care Provider:  Tonia Ghent, MD Primary Cardiologist:  Ida Rogue, MD  Patient Profile    88 year old female with a history of persistent atrial fibrillation, HFpEF, hypertension, hyperlipidemia, hypothyroidism, frequent falls, and obesity presents today for follow-up of HFpEF and persistent atrial fibrillation.  Past Medical History    Past Medical History:  Diagnosis Date   Arrhythmia    atrial fibrillation   CHF (congestive heart failure) (HCC)    Chronic heart failure with preserved ejection fraction (HFpEF) ([place])    a. 01/2022 Echo: EF 65-70%, no rwma, mild LVH, GrII DD, nl RV fxn, mildly dil LA, mild MR, ao scelrosis.   Fall    GERD (gastroesophageal reflux disease)    History of chicken pox    Hx of colonic polyps    Hyperlipidemia    Hypertension    Hypothyroidism    Keratopathy 2018   R eye   LVH (left ventricular hypertrophy)    Persistent atrial fibrillation ([place])    a. Initially dx in 2012; b. 01/2022 recurrent AF rvr noted-->warfarin added (CHA2DS2VASc = 5); c. 03/2022 Amio added-->DCCV x 1 (150J).   Past Surgical History:  Procedure Laterality Date   CARDIOVASCULAR STRESS TEST  2011   normal    CARDIOVERSION N/A 04/02/2022   Procedure: CARDIOVERSION;  Surgeon: Minna Merritts, MD;  Location: [facility];  Service: Cardiovascular;  Laterality: N/A;   CORONARY STENT INTERVENTION N/A 05/06/2022   Procedure: CORONARY STENT INTERVENTION;  Surgeon: Wellington Hampshire, MD;  Location: [facility];  Service: Cardiovascular;  Laterality: N/A;   keratopathy     LEFT HEART CATH AND CORONARY ANGIOGRAPHY N/A 05/06/2022   Procedure: LEFT HEART CATH AND CORONARY ANGIOGRAPHY;  Surgeon: Wellington Hampshire, MD;  Location: [facility];  Service: Cardiovascular;  Laterality: N/A;    Allergies  Allergies  Allergen Reactions   Statins     Myalgias   Wheat      Throat swelling   Ezetimibe Other (See Comments)    Extreme fatigue   Jardiance [Empagliflozin] Other (See Comments)    Dry mouth, "gasping for breath"   Amlodipine     hematuria   Eucalyptus Oil    Lisinopril     Unrecalled BP med- possibly lisinopril- intolerant, had cough on the medicine- changed to cozaar   Rice Nausea And Vomiting    History of Present Illness    Julie Phillips is an 88 year old female with past medical history including persistent atrial fibrillation on chronic anticoagulation with Coumadin, HFpEF, hypertension, hyperlipidemia, hypothyroidism, frequent falls, and obesity.  She had a coronary CTA which revealed coronary calcium score 1 out of 2 which is 87th percentile for age and sex matched control.  Normal coronary origin with right dominance, calcified plaque causing severe proximal mid LAD stenosis (greater than 70%), calcified plaque in the moderate proximal left circumflex stenosis (50%), CAD-RADS 4 severe stenosis (70-99% or >50% left main), with cardiac catheterization recommended.  Left heart catheterization completed on 05/06/2022 showed a second marginal lesion with 90% stenosed, mid LAD lesion with 90% stenosis, mid LAD to distal LAD stenosis of 20%, she underwent successful DES placement to the mid LAD.  Cath revealed severe vessel coronary disease with 90% stenosis in the mid LAD and subtotal occlusion of the small OM 2, codominant coronary arteries, coronary arteries were moderately calcified overall, mildly elevated LVEDP, left  ventricular angiogram was not performed EF was normal by echocardiogram.  She was last seen in clinic 09/30/2022 with complaints of exertional dyspnea occasional sharp stabbing chest pain.  Her peripheral edema had improved since increasing her Lasix at her previous visit.  She was sent for a pulmonary consultation for STOP-BANG score of 6.  She returns clinic today with complaints of varying blood pressures.  She has been  keeping a log at home as she does have a manual blood pressure cuff at home.  She is concerned that the fluctuations in her blood pressures once been causing her extreme fatigue and weakness.  She denies any chest pain worsening shortness of breath still has occasional peripheral edema.  She is also awaiting a call about her sleep study from pulmonary.  She denies any hospitalizations or visits to the emergency department.  We did receive phone calls previously for the pharmacy use trying to help straighten her medications out for her as the patient was stating that she had low blood pressures but when the pharmacist had checked her pressure at that time she was running about 159.  Her medications have been changed around varying throughout the day to try to regulate her blood pressure more effectively.  Home Medications    Current Outpatient Medications  Medication Sig Dispense Refill   amiodarone (PACERONE) 200 MG tablet Take 1 tablet (200 mg total) by mouth daily. 90 tablet 3   clopidogrel (PLAVIX) 75 MG tablet Take 1 tablet (75 mg total) by mouth daily with breakfast. 30 tablet 6   diltiazem (CARDIZEM CD) 120 MG 24 hr capsule Take 1 capsule (120 mg total) by mouth daily. 90 capsule 3   furosemide (LASIX) 20 MG tablet TAKE 1 TABLET BY MOUTH 2 TIMES DAILY 180 tablet 0   levothyroxine (SYNTHROID) 100 MCG tablet TAKE 1 TABLET BY MOUTH ONCE A DAY BEFOREBREAKFAST. 90 tablet 3   losartan (COZAAR) 100 MG tablet Take 1 tablet (100 mg total) by mouth daily. 90 tablet 3   LOSARTAN POTASSIUM PO Apply 0.8 mg to eye in the morning, at noon, in the evening, and at bedtime.     nitroGLYCERIN (NITROSTAT) 0.4 MG SL tablet Place 1 tablet (0.4 mg total) under the tongue every 5 (five) minutes as needed for chest pain. 25 tablet 3   pantoprazole (PROTONIX) 20 MG tablet Take 1 tablet (20 mg total) by mouth daily. (Patient taking differently: Take 20 mg by mouth as needed.) 90 tablet 3   Polyethyl Glycol-Propyl Glycol  (SYSTANE) 0.4-0.3 % GEL ophthalmic gel Place 1 application into both eyes.     potassium chloride (KLOR-CON) 10 MEQ tablet TAKE 1 TABLET BY MOUTH ONCE A DAY 90 tablet 1   prednisoLONE acetate (PRED FORTE) 1 % ophthalmic suspension Apply to eye.     traZODone (DESYREL) 50 MG tablet Take 0.5-1 tablets (25-50 mg total) by mouth at bedtime as needed for sleep. Hold for patient request. 30 tablet 1   warfarin (COUMADIN) 5 MG tablet TAKE 1 TABLET (5 MG) BY MOUTH DAILY EXCEPT TAKE 1/2 TABLET (2.5 MG) ON MONDAYS AND THURSDAYS OR AS DIRECTED BY ANTICOAGULATION CLINIC 110 tablet 1   famotidine (PEPCID) 20 MG tablet Take 1 tablet (20 mg total) by mouth at bedtime. (Patient not taking: Reported on 10/22/2022) 30 tablet 0   gabapentin (NEURONTIN) 100 MG capsule TAKE 1 CAPSULE BY MOUTH 3 TIMES DAILY ASNEEDED (Patient not taking: Reported on 10/22/2022) 270 capsule 3   metoprolol succinate (TOPROL-XL) 50 MG 24  hr tablet Take 1 tablet (50 mg) by mouth once daily. Take with or immediately following a meal. 180 tablet 0   No current facility-administered medications for this visit.     Family History    Family History  Problem Relation Age of Onset   Heart disease Mother    Heart disease Father    Breast cancer Sister    Breast cancer Daughter    Colon cancer Neg Hx    She indicated that her mother is deceased. She indicated that her father is deceased. She indicated that her sister is alive. She indicated that three of her four brothers are alive. She indicated that her daughter is deceased. She indicated that the status of her neg hx is unknown.  Social History    Social History   Socioeconomic History   Marital status: Widowed    Spouse name: Not on file   Number of children: Not on file   Years of education: Not on file   Highest education level: Not on file  Occupational History   Occupation: Retired Armed forces technical officer: RETIRED  Tobacco Use   Smoking status: Former    Packs/day:  2.00    Years: 20.00    Total pack years: 40.00    Types: Cigarettes    Quit date: 12/14/1971    Years since quitting: 50.8   Smokeless tobacco: Never  Vaping Use   Vaping Use: Never used  Substance and Sexual Activity   Alcohol use: Not Currently   Drug use: No   Sexual activity: Not Currently  Other Topics Concern   Not on file  Social History Narrative   Divorced   Education:  [employer]   8 pregnancies, 6 live births   LMP: 1985   Lives in independent living.     Daughter died February 25, 2022   Social Determinants of Health   Financial Resource Strain: Low Risk  (06/24/2022)   Overall Financial Resource Strain (CARDIA)    Difficulty of Paying Living Expenses: Not hard at all  Food Insecurity: No Food Insecurity (06/24/2022)   Hunger Vital Sign    Worried About Running Out of Food in the Last Year: Never true    Ran Out of Food in the Last Year: Never true  Transportation Needs: No Transportation Needs (07/09/2019)   PRAPARE - Hydrologist (Medical): No    Lack of Transportation (Non-Medical): No  Physical Activity: Inactive (06/24/2022)   Exercise Vital Sign    Days of Exercise per Week: 0 days    Minutes of Exercise per Session: 0 min  Stress: No Stress Concern Present (06/24/2022)   [place]    Feeling of Stress : Not at all  Social Connections: Not on file  Intimate Partner Violence: Not At Risk (07/09/2019)   Humiliation, Afraid, Rape, and Kick questionnaire    Fear of Current or Ex-Partner: No    Emotionally Abused: No    Physically Abused: No    Sexually Abused: No     Review of Systems    General:  No chills, fever, night sweats or weight changes.  Endorses weakness and fatigue Cardiovascular:  No chest pain, endorses chronic dyspnea on exertion, edema, but denies orthopnea, palpitations, paroxysmal nocturnal dyspnea. Dermatological: No rash,  lesions/masses Respiratory: No cough, endorses chronic dyspnea Urologic: No hematuria, dysuria Abdominal:   No nausea, vomiting, diarrhea, bright red blood per rectum, melena, or hematemesis  Neurologic:  No visual changes, versus wkns, changes in mental status.All other systems reviewed and are otherwise negative except as noted above.   Physical Exam    VS:  BP (!) 122/44 (BP Location: Right Arm, Patient Position: Sitting, Cuff Size: Large)   Pulse (!) 56   Ht 5' (1.524 m)   Wt 229 lb 6.4 oz (104.1 kg)   SpO2 95%   BMI 44.80 kg/m  , BMI Body mass index is 44.8 kg/m. STOP-Bang Score:  6      GEN: Well nourished, well developed, in no acute distress. HEENT: normal. Neck: Supple, no JVD, carotid bruits, or masses. Cardiac: RRR, I/VI murmur, without rubs, or gallops. No clubbing, cyanosis, trace pretibial edema.  Radials/DP/PT 2+ and equal bilaterally.  Respiratory:  Respirations regular and unlabored, clear to auscultation bilaterally. GI: Soft, nontender, nondistended, BS + x 4. MS: no deformity or atrophy. Skin: warm and dry, no rash. Neuro:  Strength and sensation are intact. Psych: Normal affect.  Accessory Clinical Findings    ECG personally reviewed by me today-sinus bradycardia with a rate of 56- No acute changes  Lab Results  Component Value Date   WBC 9.7 09/21/2022   HGB 11.2 (L) 09/21/2022   HCT 34.6 (L) 09/21/2022   MCV 88.3 09/21/2022   PLT 310 09/21/2022   Lab Results  Component Value Date   CREATININE 0.93 09/21/2022   BUN 18 09/21/2022   NA 140 09/21/2022   K 3.9 09/21/2022   CL 104 09/21/2022   CO2 25 09/21/2022   Lab Results  Component Value Date   ALT 10 04/01/2022   AST 15 04/01/2022   ALKPHOS 91 04/01/2022   BILITOT 0.8 04/01/2022   Lab Results  Component Value Date   CHOL 238 (H) 08/12/2022   HDL 54.40 08/12/2022   LDLCALC 161 (H) 08/12/2022   LDLDIRECT 152.6 12/31/2013   TRIG 112.0 08/12/2022   CHOLHDL 4 08/12/2022    No results  found for: "HGBA1C"  Assessment & Plan   1.  Generalized weakness and worsening fatigue.  With fluctuating blood pressures with systolic blood pressures running in the 80s in the morning in the 120s by the afternoon.  She also has fluctuating heart rates of a.m. pulses in the 50s and then afternoon running into the 60s.  With the issues with low heart rate and low blood pressure in the morning causing generalized weakness and fatigue her p.m. dose of metoprolol succinate has been placed on hold.  2.  Persistent atrial fibrillation status post cardioversion currently maintaining sinus bradycardia with a rate of 56 on EKG today.  She is continued on amiodarone 200 mg daily, diltiazem 150 mg daily, metoprolol 50 mg daily as well as warfarin with a CHA2DS2-VASc score of at least 5.  3.  Essential hypertension with blood pressure today of 122/44.  Blood pressures remained stable in the afternoon but she brought in a blood pressure cuff from home and it correlates to what we are getting in the office today.  Unfortunately blood pressures in the mornings have been in the 70W systolic.  At this time with low blood pressure and low heart rate her p.m. dose of metoprolol succinate has been placed on hold.  She has been encouraged to continue to monitor blood pressures and keep a log at home as she has done up until today.  4.  Coronary artery disease with PCI/DES to the LAD in 04/2022.  She continues have stable angina shortness of breath  which may need further ischemic work-up after she has followed up by pulmonary.  She is continued on clopidogrel and warfarin in lieu of aspirin.  She is resistant to taking any new medications that she has a known history of statin intolerance and was unable to tolerate a recent style of Zetia due to extreme fatigue as well.  We will try and revisit newer injectable therapies return visit.  5.  Hyperlipidemia with last LDL of 161 08/02/2022.  She has a history of statin  intolerance.  Will discuss PCSK9 inhibitor therapy on her return.  6.  Chronic shortness of breath with PND that has improved.  She has been continued furosemide 20 mg twice daily.  She is also recently followed up with pulmonary for STOP-BANG score of 6.  She is awaiting to hear when she will have her sleep study completed.  7.  Disposition patient return to clinic in 2 to 3 weeks to see MD/APP to follow-up on blood pressure and heart rate after p.m. dose of metoprolol succinate has been placed on hold.  Marialuiza Car, NP 10/22/2022, 3:53 PM

## 2022-10-22 NOTE — Patient Instructions (Signed)
Medication Instructions:  - Your physician has recommended you make the following change in your medication:   1) STOP your night time dose of Metoprolol Succinate  *If you need a refill on your cardiac medications before your next appointment, please call your pharmacy*   Lab Work: - none ordered  If you have labs (blood work) drawn today and your tests are completely normal, you will receive your results only by: [place] (if you have MyChart) OR A paper copy in the mail If you have any lab test that is abnormal or we need to change your treatment, we will call you to review the results.   Testing/Procedures: - none ordered   Follow-Up: At [facility], you and your health needs are our priority.  As part of our continuing mission to provide you with exceptional heart care, we have created designated Provider Care Teams.  These Care Teams include your primary Cardiologist (physician) and Advanced Practice Providers (APPs -  Physician Assistants and Nurse Practitioners) who all work together to provide you with the care you need, when you need it.  We recommend signing up for the patient portal called "MyChart".  Sign up information is provided on this After Visit Summary.  MyChart is used to connect with patients for Virtual Visits (Telemedicine).  Patients are able to view lab/test results, encounter notes, upcoming appointments, etc.  Non-urgent messages can be sent to your provider as well.   To learn more about what you can do with MyChart, go to [email or web address].    Your next appointment:   2-3 week(s)  The format for your next appointment:   In Person  Provider:   You may see Ida Rogue, MD or one of the following Advanced Practice Providers on your designated Care Team:    Gerrie Nordmann, NP    Other Instructions N/a  Important Information About Sugar

## 2022-10-28 ENCOUNTER — Ambulatory Visit (INDEPENDENT_AMBULATORY_CARE_PROVIDER_SITE_OTHER): Payer: PPO

## 2022-10-28 DIAGNOSIS — Z7901 Long term (current) use of anticoagulants: Secondary | ICD-10-CM

## 2022-10-28 LAB — POCT INR: INR: 3.1 — AB (ref 2.0–3.0)

## 2022-10-28 NOTE — Patient Instructions (Addendum)
Eat an extra serving of greens within the next 48 hours.  Continue 1 tablet daily.  Recheck on 12/14 at 10:30.

## 2022-10-28 NOTE — Progress Notes (Signed)
Instructed pt to eat an extra serving of greens within the next 48 hours, states she will go home and have some broccoli with lunch.  Continue 1 tablet daily.  Recheck in 4 weeks.eks.

## 2022-11-12 ENCOUNTER — Emergency Department: Payer: PPO

## 2022-11-12 ENCOUNTER — Encounter: Payer: Self-pay | Admitting: Cardiology

## 2022-11-12 ENCOUNTER — Ambulatory Visit: Payer: PPO | Attending: Cardiology | Admitting: Cardiology

## 2022-11-12 ENCOUNTER — Other Ambulatory Visit: Payer: Self-pay

## 2022-11-12 ENCOUNTER — Inpatient Hospital Stay
Admission: EM | Admit: 2022-11-12 | Discharge: 2022-11-16 | DRG: 378 | Disposition: A | Discharge status: Home Health | Payer: PPO | Source: Ambulatory Visit | Attending: Internal Medicine | Admitting: Internal Medicine

## 2022-11-12 VITALS — BP 118/52 | HR 67 | Ht 60.0 in | Wt 223.0 lb

## 2022-11-12 DIAGNOSIS — E039 Hypothyroidism, unspecified: Secondary | ICD-10-CM | POA: Diagnosis present

## 2022-11-12 DIAGNOSIS — I10 Essential (primary) hypertension: Secondary | ICD-10-CM | POA: Diagnosis present

## 2022-11-12 DIAGNOSIS — Z8719 Personal history of other diseases of the digestive system: Secondary | ICD-10-CM | POA: Diagnosis not present

## 2022-11-12 DIAGNOSIS — Z8249 Family history of ischemic heart disease and other diseases of the circulatory system: Secondary | ICD-10-CM

## 2022-11-12 DIAGNOSIS — Z7902 Long term (current) use of antithrombotics/antiplatelets: Secondary | ICD-10-CM

## 2022-11-12 DIAGNOSIS — D631 Anemia in chronic kidney disease: Secondary | ICD-10-CM | POA: Diagnosis present

## 2022-11-12 DIAGNOSIS — D649 Anemia, unspecified: Principal | ICD-10-CM

## 2022-11-12 DIAGNOSIS — K640 First degree hemorrhoids: Secondary | ICD-10-CM | POA: Diagnosis not present

## 2022-11-12 DIAGNOSIS — D6832 Hemorrhagic disorder due to extrinsic circulating anticoagulants: Secondary | ICD-10-CM | POA: Diagnosis present

## 2022-11-12 DIAGNOSIS — K222 Esophageal obstruction: Secondary | ICD-10-CM | POA: Diagnosis present

## 2022-11-12 DIAGNOSIS — I110 Hypertensive heart disease with heart failure: Secondary | ICD-10-CM | POA: Diagnosis present

## 2022-11-12 DIAGNOSIS — Z803 Family history of malignant neoplasm of breast: Secondary | ICD-10-CM | POA: Diagnosis not present

## 2022-11-12 DIAGNOSIS — I5032 Chronic diastolic (congestive) heart failure: Secondary | ICD-10-CM | POA: Diagnosis not present

## 2022-11-12 DIAGNOSIS — Z6841 Body Mass Index (BMI) 40.0 and over, adult: Secondary | ICD-10-CM

## 2022-11-12 DIAGNOSIS — K921 Melena: Secondary | ICD-10-CM | POA: Diagnosis not present

## 2022-11-12 DIAGNOSIS — I4891 Unspecified atrial fibrillation: Secondary | ICD-10-CM | POA: Diagnosis present

## 2022-11-12 DIAGNOSIS — Z7901 Long term (current) use of anticoagulants: Secondary | ICD-10-CM | POA: Diagnosis not present

## 2022-11-12 DIAGNOSIS — E782 Mixed hyperlipidemia: Secondary | ICD-10-CM | POA: Diagnosis present

## 2022-11-12 DIAGNOSIS — I2510 Atherosclerotic heart disease of native coronary artery without angina pectoris: Secondary | ICD-10-CM | POA: Diagnosis present

## 2022-11-12 DIAGNOSIS — K922 Gastrointestinal hemorrhage, unspecified: Secondary | ICD-10-CM | POA: Diagnosis not present

## 2022-11-12 DIAGNOSIS — I25118 Atherosclerotic heart disease of native coronary artery with other forms of angina pectoris: Secondary | ICD-10-CM

## 2022-11-12 DIAGNOSIS — K449 Diaphragmatic hernia without obstruction or gangrene: Secondary | ICD-10-CM | POA: Diagnosis present

## 2022-11-12 DIAGNOSIS — R0602 Shortness of breath: Secondary | ICD-10-CM

## 2022-11-12 DIAGNOSIS — K219 Gastro-esophageal reflux disease without esophagitis: Secondary | ICD-10-CM | POA: Diagnosis not present

## 2022-11-12 DIAGNOSIS — K649 Unspecified hemorrhoids: Secondary | ICD-10-CM | POA: Diagnosis not present

## 2022-11-12 DIAGNOSIS — E669 Obesity, unspecified: Secondary | ICD-10-CM | POA: Diagnosis not present

## 2022-11-12 DIAGNOSIS — I4819 Other persistent atrial fibrillation: Secondary | ICD-10-CM | POA: Diagnosis present

## 2022-11-12 DIAGNOSIS — I25119 Atherosclerotic heart disease of native coronary artery with unspecified angina pectoris: Secondary | ICD-10-CM | POA: Diagnosis not present

## 2022-11-12 DIAGNOSIS — I4811 Longstanding persistent atrial fibrillation: Secondary | ICD-10-CM | POA: Diagnosis not present

## 2022-11-12 DIAGNOSIS — K5790 Diverticulosis of intestine, part unspecified, without perforation or abscess without bleeding: Secondary | ICD-10-CM | POA: Diagnosis not present

## 2022-11-12 DIAGNOSIS — I509 Heart failure, unspecified: Secondary | ICD-10-CM | POA: Diagnosis not present

## 2022-11-12 DIAGNOSIS — Z66 Do not resuscitate: Secondary | ICD-10-CM | POA: Diagnosis not present

## 2022-11-12 DIAGNOSIS — K5730 Diverticulosis of large intestine without perforation or abscess without bleeding: Secondary | ICD-10-CM | POA: Diagnosis not present

## 2022-11-12 DIAGNOSIS — Z888 Allergy status to other drugs, medicaments and biological substances status: Secondary | ICD-10-CM

## 2022-11-12 DIAGNOSIS — T45515A Adverse effect of anticoagulants, initial encounter: Secondary | ICD-10-CM | POA: Diagnosis not present

## 2022-11-12 DIAGNOSIS — Z955 Presence of coronary angioplasty implant and graft: Secondary | ICD-10-CM

## 2022-11-12 DIAGNOSIS — Z79899 Other long term (current) drug therapy: Secondary | ICD-10-CM

## 2022-11-12 DIAGNOSIS — D500 Iron deficiency anemia secondary to blood loss (chronic): Secondary | ICD-10-CM | POA: Diagnosis not present

## 2022-11-12 DIAGNOSIS — Z87891 Personal history of nicotine dependence: Secondary | ICD-10-CM | POA: Diagnosis not present

## 2022-11-12 LAB — BASIC METABOLIC PANEL
Anion gap: 6 (ref 5–15)
BUN: 31 mg/dL — ABNORMAL HIGH (ref 8–23)
CO2: 24 mmol/L (ref 22–32)
Calcium: 9.1 mg/dL (ref 8.9–10.3)
Chloride: 108 mmol/L (ref 98–111)
Creatinine, Ser: 1.22 mg/dL — ABNORMAL HIGH (ref 0.44–1.00)
GFR, Estimated: 43 mL/min — ABNORMAL LOW (ref >60)
Glucose, Bld: 92 mg/dL (ref 70–99)
Potassium: 4.3 mmol/L (ref 3.5–5.1)
Sodium: 138 mmol/L (ref 135–145)

## 2022-11-12 LAB — CBC
HCT: 25.2 % — ABNORMAL LOW (ref 36.0–46.0)
Hemoglobin: 7.6 g/dL — ABNORMAL LOW (ref 12.0–15.0)
MCH: 27.2 pg (ref 26.0–34.0)
MCHC: 30.2 g/dL (ref 30.0–36.0)
MCV: 90.3 fL (ref 80.0–100.0)
Platelets: 311 10*3/uL (ref 150–400)
RBC: 2.79 MIL/uL — ABNORMAL LOW (ref 3.87–5.11)
RDW: 15.9 % — ABNORMAL HIGH (ref 11.5–15.5)
WBC: 10.5 10*3/uL (ref 4.0–10.5)
nRBC: 0 % (ref 0.0–0.2)

## 2022-11-12 LAB — TROPONIN I (HIGH SENSITIVITY)
Troponin I (High Sensitivity): 4 ng/L (ref <18)
Troponin I (High Sensitivity): 4 ng/L (ref <18)

## 2022-11-12 LAB — BRAIN NATRIURETIC PEPTIDE: B Natriuretic Peptide: 180.1 pg/mL — ABNORMAL HIGH (ref 0.0–100.0)

## 2022-11-12 MED ORDER — FUROSEMIDE 10 MG/ML IJ SOLN
20.0000 mg | Freq: Once | INTRAMUSCULAR | Status: AC
  Administered 2022-11-13: 20 mg via INTRAVENOUS
  Filled 2022-11-12: qty 4

## 2022-11-12 MED ORDER — PANTOPRAZOLE SODIUM 40 MG IV SOLR: 40.0000 mg | Freq: Two times a day (BID) | INTRAVENOUS | Status: DC

## 2022-11-12 MED ORDER — TRAZODONE HCL 50 MG PO TABS: 25.0000 mg | ORAL_TABLET | Freq: Every evening | ORAL | Status: DC | PRN

## 2022-11-12 MED ORDER — PANTOPRAZOLE INFUSION (NEW) - SIMPLE MED
8.0000 mg/h | INTRAVENOUS | Status: DC
  Administered 2022-11-12: 8 mg/h via INTRAVENOUS
  Filled 2022-11-12: qty 100

## 2022-11-12 MED ORDER — SODIUM CHLORIDE 0.9% IV SOLUTION
Freq: Once | INTRAVENOUS | Status: AC
  Administered 2022-11-14: 10 mL/h via INTRAVENOUS
  Filled 2022-11-12: qty 250

## 2022-11-12 MED ORDER — PANTOPRAZOLE 80MG IVPB - SIMPLE MED
80.0000 mg | Freq: Once | INTRAVENOUS | Status: AC
  Administered 2022-11-12: 80 mg via INTRAVENOUS
  Filled 2022-11-12: qty 100

## 2022-11-12 NOTE — ED Provider Triage Note (Signed)
Emergency Medicine Provider Triage Evaluation Note  SHAENA PARKERSON, a 88 y.o. female with a history of HTN, HLD, LVH, CHF, was evaluated in triage.  Pt complains of remittent shortness of breath.  Patient presents to the ED from her PCPs office for evaluation.  Patient denies any frank chest pain.  Review of Systems  Positive: SOB Negative: CP  Physical Exam  BP (!) 120/40   Pulse 63   Temp 98 F (36.7 C)   Resp 17   Ht 5' (1.524 m)   Wt 101.2 kg   SpO2 95%   BMI 43.55 kg/m  Gen:   Awake, no distress  NAD Resp:  Normal effort CTA MSK:   Moves extremities without difficulty  Other:    Medical Decision Making  Medically screening exam initiated at 6:39 PM.  Appropriate orders placed.  KOOPER CHRISWELL was informed that the remainder of the evaluation will be completed by another provider, this initial triage assessment does not replace that evaluation, and the importance of remaining in the ED until their evaluation is complete.  Geriatric patient to the ED for evaluation of intermittent shortness of breath.  She presents to the ED at the advice of her primary provider.   Melvenia Needles, PA-C 11/12/22 1843

## 2022-11-12 NOTE — Patient Instructions (Signed)
Patient sent to the Emergency room

## 2022-11-12 NOTE — ED Provider Notes (Signed)
[facility] Provider Note    Event Date/Time   First MD Initiated Contact with Patient 11/12/22 2011     (approximate)   History   Shortness of Breath   HPI  Julie Phillips is a 88 y.o. female  who presents to the emergency department from her doctors office because of concern for episodes of shortness of breath. The patient says that she has had sporadic episodes over the past year where she will become acutely short of breath and feel like she is having a hard time breathing.  However has noticed over the past week that the episodes have become more frequent. She had a couple of these episodes at her doctors office today. In addition the patient states that she has felt increased fatigue over the past week and has noticed black stools for roughly the same amount of time. No abdominal pain.      Physical Exam   Triage Vital Signs: ED Triage Vitals  Enc Vitals Group     BP 11/12/22 1545 102/87     Pulse Rate 11/12/22 1545 61     Resp 11/12/22 1545 16     Temp 11/12/22 1545 98.1 F (36.7 C)     Temp Source 11/12/22 1545 Oral     SpO2 11/12/22 1545 99 %     Weight 11/12/22 1544 223 lb (101.2 kg)     Height 11/12/22 1544 5' (1.524 m)     Head Circumference --      Peak Flow --      Pain Score 11/12/22 1544 0     Pain Loc --      Pain Edu? --      Excl. in [place]? --     Most recent vital signs: Vitals:   11/12/22 1545 11/12/22 1834  BP: 102/87 (!) 120/40  Pulse: 61 63  Resp: 16 17  Temp: 98.1 F (36.7 C) 98 F (36.7 C)  SpO2: 99% 95%   General: Awake, alert, oriented. CV:  Good peripheral perfusion. Regular rate and rhythm. Resp:  Normal effort. Lungs clear. Abd:  No distention.     ED Results / Procedures / Treatments   Labs (all labs ordered are listed, but only abnormal results are displayed) Labs Reviewed  BASIC METABOLIC PANEL - Abnormal; Notable for the following components:      Result Value   BUN 31 (*)    Creatinine, Ser  1.22 (*)    GFR, Estimated 43 (*)    All other components within normal limits  CBC - Abnormal; Notable for the following components:   RBC 2.79 (*)    Hemoglobin 7.6 (*)    HCT 25.2 (*)    RDW 15.9 (*)    All other components within normal limits  BRAIN NATRIURETIC PEPTIDE - Abnormal; Notable for the following components:   B Natriuretic Peptide 180.1 (*)    All other components within normal limits  TYPE AND SCREEN  TROPONIN I (HIGH SENSITIVITY)  TROPONIN I (HIGH SENSITIVITY)     EKG  I, Nance Pear, attending physician, personally viewed and interpreted this EKG  EKG Time: 1546 Rate: 61 Rhythm: normal sinus rhythm Axis: normal Intervals: qtc 467 QRS: narrow ST changes: no st elevation Impression: normal ekg   RADIOLOGY I independently interpreted and visualized the CXR. My interpretation: No pneumonia Radiology interpretation:  IMPRESSION:  No active cardiopulmonary disease.      PROCEDURES:  Critical Care performed: No  Procedures  MEDICATIONS ORDERED IN ED: Medications - No data to display   IMPRESSION / MDM / [place] / ED COURSE  I reviewed the triage vital signs and the nursing notes.                              Differential diagnosis includes, but is not limited to, pneumonia, anemia, acs.  Patient's presentation is most consistent with acute presentation with potential threat to life or bodily function.  Patient presented to the emerged to department from [place] office today because of couple episodes of shortness of breath.  Patient additionally states she has been fatigued over the past week and has noticed black stools.  Blood work shows hemoglobin of 7.6.  Blood work sent roughly a month and a half ago shows hemoglobin of 11.2.  This time I do have concerns for GI bleed causing anemia.  Patient is neither hypotensive nor tachycardic here.  Will start IV Protonix.  Discussed with Dr. Clearence Ped with the hospitalist service  who will plan on admission.     FINAL CLINICAL IMPRESSION(S) / ED DIAGNOSES   Final diagnoses:  Anemia, unspecified type  Shortness of breath  Gastrointestinal hemorrhage, unspecified gastrointestinal hemorrhage type       Note:  This document was prepared using Dragon voice recognition software and may include unintentional dictation errors.    Nance Pear, MD 11/12/22 2250

## 2022-11-12 NOTE — ED Triage Notes (Signed)
Reports was at MD office and having episodes of sob.  Reports it comes and goes.  Denies CP.  Reports it just suddenly comes on when she is sitting.  MD said she could hear mild wheezing.  Patient appears in no respiratory distress at this time.  Resting comfortably in wheelchair drinking water.

## 2022-11-12 NOTE — Progress Notes (Signed)
Cardiology Clinic Note   Patient Name: Julie Phillips Date of Encounter: 11/12/2022  Primary Care Provider:  Tonia Ghent, MD Primary Cardiologist:  Ida Rogue, MD  Patient Profile    88 year old female with history of HFpEF, persistent atrial fibrillation, hypertension, hyperlipidemia, hypothyroidism, hearing loss, obesity who presents today for follow-up.  Past Medical History    Past Medical History:  Diagnosis Date   Arrhythmia    atrial fibrillation   CHF (congestive heart failure) (HCC)    Chronic heart failure with preserved ejection fraction (HFpEF) ([place])    a. 01/2022 Echo: EF 65-70%, no rwma, mild LVH, GrII DD, nl RV fxn, mildly dil LA, mild MR, ao scelrosis.   Fall    GERD (gastroesophageal reflux disease)    History of chicken pox    Hx of colonic polyps    Hyperlipidemia    Hypertension    Hypothyroidism    Keratopathy 2018   R eye   LVH (left ventricular hypertrophy)    Persistent atrial fibrillation ([place])    a. Initially dx in 2012; b. 01/2022 recurrent AF rvr noted-->warfarin added (CHA2DS2VASc = 5); c. 03/2022 Amio added-->DCCV x 1 (150J).   Past Surgical History:  Procedure Laterality Date   CARDIOVASCULAR STRESS TEST  2011   normal    CARDIOVERSION N/A 04/02/2022   Procedure: CARDIOVERSION;  Surgeon: Minna Merritts, MD;  Location: [facility] ORS;  Service: Cardiovascular;  Laterality: N/A;   CORONARY STENT INTERVENTION N/A 05/06/2022   Procedure: CORONARY STENT INTERVENTION;  Surgeon: Wellington Hampshire, MD;  Location: [facility];  Service: Cardiovascular;  Laterality: N/A;   keratopathy     LEFT HEART CATH AND CORONARY ANGIOGRAPHY N/A 05/06/2022   Procedure: LEFT HEART CATH AND CORONARY ANGIOGRAPHY;  Surgeon: Wellington Hampshire, MD;  Location: [facility];  Service: Cardiovascular;  Laterality: N/A;    Allergies  Allergies  Allergen Reactions   Statins     Myalgias   Wheat     Throat swelling   Ezetimibe Other (See  Comments)    Extreme fatigue   Jardiance [Empagliflozin] Other (See Comments)    Dry mouth, "gasping for breath"   Amlodipine     hematuria   Eucalyptus Oil    Lisinopril     Unrecalled BP med- possibly lisinopril- intolerant, had cough on the medicine- changed to cozaar   Rice Nausea And Vomiting    History of Present Illness    Julie Phillips is an 88 year old female with past medical history including HFpEF, persistent atrial fibrillation on chronic anticoagulation with Coumadin, hypertension, hyperlipidemia, hypothyroidism, frequent falls, and obesity.  She was initially diagnosed with atrial fibrillation send hypokalemia due to cirrhosis.  Echocardiogram at that time showed preserved EF with mild mitral and tricuspid regurgitation.  Stress test revealed to be nonischemic.  She was initially placed on warfarin but this was discontinued due to frequent falls.  In February 2023 she was admitted to [facility] for chest pressure and mildly elevated high-sensitivity troponin in the setting of atrial fibrillation with RVR.  She reported that at times tachypalpitations dating back to around Christmas of 2022.  Echocardiogram to admission revealed EF of 65-70%, no regional wall motion abnormalities, mild LVH, G2 DD, mildly dilated left atrium, mild MR and aortic valve sclerosis without stenosis.  In March she was maintaining sinus rhythm on diltiazem and metoprolol.  She was scheduled for coronary CT angiogram however upon presentation was noted to be in atrial  fibrillation and the testing was canceled.  Diltiazem was titrated up to 90 mg daily which did not tolerate the increased dose to continue 2020 mg.  Unfortunately April 20 12/2021 she presented to primary care with complaints of dyspnea and was found to be in atrial fibrillation RVR with rates in the 140s.  She was subsequently transferred to [facility] emergency department her BNP was elevated at 696.  She was treated with intravenous Lasix.  She was placed on  IV amiodarone and subsequently underwent successful cardioversion on April 20 12/2021.  Amiodarone was transitioned to oral dosing and she was subsequently discharged home.  04/22/2022 she underwent coronary CT which revealed coronary calcium score of 2 which was 24 percentile for age and sex matched control.  Normal coronary origin with right dominance.  Calcified plaque causing severe proximal mid LAD stenosis (greater than 70%), calcified plaque with moderate proximal left circumflex stenosis (50%).  CAD-RADS 4 severe/(70-99% or greater than 50% left main) cardiac catheterization was recommended.  She then presented for an elective left heart catheterization on 05/06/2022 where she had second marginal lesion and 90% stenosed mid LAD lesion 90% stenosis, mid LAD to distal LAD stenosis of 20%.  She underwent successful DES placement to the mid LAD.  Cath revealed severe vessel coronary artery disease 90% stenosis in the mid LAD and subtotal occlusion of the small OM 2, codominant coronary arteries, coronary arteries were moderately calcified overall, mildly elevated LVEDP, left ventricular angiography was not performed EF was normal by echocardiogram.  She was last seen in clinic on 09/30/2022 with complaints of exertional dyspnea and occasional*chest pain.  Her stabbing chest pain has not changed in frequency or intensity.  She was referred to pulmonary for STOP-BANG score of 6.  She had noted lower blood pressures in the evening and after visit and her evening dose of metoprolol was discontinued and she was encouraged to continue monitoring blood pressure at home.  With the shortness of breath and PND she was also sent for a D-dimer which was mildly elevated and then with a CT of her chest that was completed that showed she was negative for PE.  Her furosemide had also been increased to help with the peripheral edema that she had been suffering from and that it also improved.  She returns to clinic with varying  symptoms.  She states that she is having shortness of breath that comes on all of a sudden to where she is unable to get air in or swallow and feels there is something stuck in her throat.  She states that she is no longer suffering from PND or woke up gasping in the evening since she was started on a sleeping pill but she has noticed that it started happening during the day. She denies chest pain and her peripheral edema has improved. She endorses fever and chills. Her symptoms have been on and off for the last week. She stopped taking her amiodarone and is requesting a reduced dose since looking up her medication on the Internet.  She also had an intolerance to Zetia in the past and Zetia has been added back to her medication packets which will need to be removed.  She is also noted.  Bruising to her bilateral arms and 1 area on the left arm that she stating was bleeding.  She also endorses that her blood pressure has improved.  She is accompanied today by family member.   Home Medications    Current Outpatient Medications  Medication Sig Dispense Refill   amiodarone (PACERONE) 200 MG tablet Take 1 tablet (200 mg total) by mouth daily. 90 tablet 3   clopidogrel (PLAVIX) 75 MG tablet Take 1 tablet (75 mg total) by mouth daily with breakfast. 30 tablet 6   diltiazem (CARDIZEM CD) 120 MG 24 hr capsule Take 1 capsule (120 mg total) by mouth daily. 90 capsule 3   ezetimibe (ZETIA) 10 MG tablet Take 10 mg by mouth daily.     furosemide (LASIX) 20 MG tablet TAKE 1 TABLET BY MOUTH 2 TIMES DAILY 180 tablet 0   gabapentin (NEURONTIN) 100 MG capsule TAKE 1 CAPSULE BY MOUTH 3 TIMES DAILY ASNEEDED 270 capsule 3   levothyroxine (SYNTHROID) 100 MCG tablet TAKE 1 TABLET BY MOUTH ONCE A DAY BEFOREBREAKFAST. 90 tablet 3   losartan (COZAAR) 100 MG tablet Take 1 tablet (100 mg total) by mouth daily. 90 tablet 3   LOSARTAN POTASSIUM PO Apply 0.8 mg to eye in the morning, at noon, in the evening, and at bedtime.      metoprolol succinate (TOPROL-XL) 50 MG 24 hr tablet Take 1 tablet (50 mg) by mouth once daily. Take with or immediately following a meal. 180 tablet 0   nitroGLYCERIN (NITROSTAT) 0.4 MG SL tablet Place 1 tablet (0.4 mg total) under the tongue every 5 (five) minutes as needed for chest pain. 25 tablet 3   omeprazole (PRILOSEC) 20 MG capsule Take 20 mg by mouth daily. TAKES PRN     pantoprazole (PROTONIX) 20 MG tablet Take 1 tablet (20 mg total) by mouth daily. 90 tablet 3   Polyethyl Glycol-Propyl Glycol (SYSTANE) 0.4-0.3 % GEL ophthalmic gel Place 1 application into both eyes.     potassium chloride (KLOR-CON) 10 MEQ tablet TAKE 1 TABLET BY MOUTH ONCE A DAY 90 tablet 1   prednisoLONE acetate (PRED FORTE) 1 % ophthalmic suspension Apply to eye.     traZODone (DESYREL) 50 MG tablet Take 0.5-1 tablets (25-50 mg total) by mouth at bedtime as needed for sleep. Hold for patient request. 30 tablet 1   warfarin (COUMADIN) 5 MG tablet TAKE 1 TABLET (5 MG) BY MOUTH DAILY EXCEPT TAKE 1/2 TABLET (2.5 MG) ON MONDAYS AND THURSDAYS OR AS DIRECTED BY ANTICOAGULATION CLINIC 110 tablet 1   No current facility-administered medications for this visit.     Family History    Family History  Problem Relation Age of Onset   Heart disease Mother    Heart disease Father    Breast cancer Sister    Breast cancer Daughter    Colon cancer Neg Hx    She indicated that her mother is deceased. She indicated that her father is deceased. She indicated that her sister is alive. She indicated that three of her four brothers are alive. She indicated that her daughter is deceased. She indicated that the status of her neg hx is unknown.  Social History    Social History   Socioeconomic History   Marital status: Widowed    Spouse name: Not on file   Number of children: Not on file   Years of education: Not on file   Highest education level: Not on file  Occupational History   Occupation: Retired Management consultant: RETIRED  Tobacco Use   Smoking status: Former    Packs/day: 2.00    Years: 20.00    Total pack years: 40.00    Types: Cigarettes    Quit date: 12/14/1971  Years since quitting: 50.9   Smokeless tobacco: Never  Vaping Use   Vaping Use: Never used  Substance and Sexual Activity   Alcohol use: Not Currently   Drug use: No   Sexual activity: Not Currently  Other Topics Concern   Not on file  Social History Narrative   Divorced   Education:  [employer]   8 pregnancies, 6 live births   LMP: 1985   Lives in independent living.     Daughter died 02-25-2022   Social Determinants of Health   Financial Resource Strain: Low Risk  (06/24/2022)   Overall Financial Resource Strain (CARDIA)    Difficulty of Paying Living Expenses: Not hard at all  Food Insecurity: No Food Insecurity (06/24/2022)   Hunger Vital Sign    Worried About Running Out of Food in the Last Year: Never true    Ran Out of Food in the Last Year: Never true  Transportation Needs: No Transportation Needs (07/09/2019)   PRAPARE - Hydrologist (Medical): No    Lack of Transportation (Non-Medical): No  Physical Activity: Inactive (06/24/2022)   Exercise Vital Sign    Days of Exercise per Week: 0 days    Minutes of Exercise per Session: 0 min  Stress: No Stress Concern Present (06/24/2022)   [place]    Feeling of Stress : Not at all  Social Connections: Not on file  Intimate Partner Violence: Not At Risk (07/09/2019)   Humiliation, Afraid, Rape, and Kick questionnaire    Fear of Current or Ex-Partner: No    Emotionally Abused: No    Physically Abused: No    Sexually Abused: No     Review of Systems    General: Endorses chills, fever, but denies night sweats or weight changes.  Endorses fatigue Cardiovascular:  No chest pain, endorses dyspnea on exertion, endorses occasional peripheral edema, orthopnea,  palpitations, paroxysmal nocturnal dyspnea. HEENT: Endorses difficulty swallowing like something stuck in her throat Dermatological: No rash, lesions/masses Respiratory: No cough, dyspnea Urologic: No hematuria, dysuria Abdominal:   No nausea, vomiting, diarrhea, bright red blood per rectum, melena, or hematemesis Neurologic:  No visual changes, wkns, changes in mental status. All other systems reviewed and are otherwise negative except as noted above.   Physical Exam    VS:  BP (!) 118/52   Pulse 67   Ht 5' (1.524 m)   Wt 223 lb (101.2 kg)   SpO2 96%   BMI 43.55 kg/m  , BMI Body mass index is 43.55 kg/m. STOP-Bang Score:  6      GEN: Well nourished, well developed, in no acute distress. HEENT: She does have wheezing noted in her upper airway on occasion while trying to talk in the exam room, no masses noted, having difficulty swallowing Neck: Supple, no JVD, carotid bruits, or masses. Cardiac: RRR, I/VI murmur, without rubs, or gallops. No clubbing, cyanosis, trace edema.  Radials 2+/PT 2+ and equal bilaterally.  Respiratory:  Respirations regular and unlabored, clear to auscultation bilaterally.  GI: Soft, nontender, nondistended, BS + x 4. MS: no deformity or atrophy. Skin: warm and dry, no rash. Neuro:  Strength and sensation are intact. Psych: Normal affect.  Accessory Clinical Findings    ECG personally reviewed by me today-sinus rhythm with a rate of 62 ST and T wave changes noted inferior and lateral leads- No acute changes  Lab Results  Component Value Date   WBC 9.7  09/21/2022   HGB 11.2 (L) 09/21/2022   HCT 34.6 (L) 09/21/2022   MCV 88.3 09/21/2022   PLT 310 09/21/2022   Lab Results  Component Value Date   CREATININE 0.93 09/21/2022   BUN 18 09/21/2022   NA 140 09/21/2022   K 3.9 09/21/2022   CL 104 09/21/2022   CO2 25 09/21/2022   Lab Results  Component Value Date   ALT 10 04/01/2022   AST 15 04/01/2022   ALKPHOS 91 04/01/2022   BILITOT 0.8  04/01/2022   Lab Results  Component Value Date   CHOL 238 (H) 08/12/2022   HDL 54.40 08/12/2022   LDLCALC 161 (H) 08/12/2022   LDLDIRECT 152.6 12/31/2013   TRIG 112.0 08/12/2022   CHOLHDL 4 08/12/2022    No results found for: "HGBA1C"  Assessment & Plan   1.  Shortness of breath with difficulty swallowing.  Patient sitting in exam room talking and had sudden onset of inability to take a breath and or swallow feeling as if she had something blocking her airway.  She was unable to cough and started to feel lightheaded like she might pass out.  Oxygen saturations were 96% on room air.  Once moving her head up and down she was able to breathe and swallow.  She was offered water in the office and was able to drink that after the episode.  With a second episode of her doing this in the clinic today she was sent to the emergency department for further evaluation to ensure that she had no blockages in her throat preventing her from being able to swallow and breathe it was explained to Mrs. Gatchell and her family that it was not safe to send her home alone with inability to breathe and swallow from time to time without proper evaluation.   2.  Persistent atrial fibrillation status post cardioversion currently in sinus rhythm with rate of 60 on EKG.  After much discussion.  Troponin 100 mg daily.  She has been continued on diltiazem 120 mg daily and Toprol-XL 50 mg once daily and warfarin for CHA2DS2-VASc score of at least 5  3.  Essential hypertension with blood pressure today 118/52.  Blood pressure remained stable.  She is continued on current medication regimen without changes to her blood pressure medications at this time.  She has also been advised to continue to monitor her pressure at home.  4.  Hyperlipidemia with LDL of 161 on 08/02/2022.  She has been statin intolerant.  We will she failed a retrial on Zetia.  On return we will need to start a PCSK9 inhibitor.  5.  Coronary artery disease with  PCI/DES to the mid LAD in 04/2022.  She continues have stable angina shortness of breath which may be considered as chest pain equivalent.  She is continued on clopidogrel and warfarin in place of aspirin.  She has been resistant to taking any new medications.  She is statin intolerant and has had failure on Zetia.  We will continue to discuss injectables after return.  6.  Disposition patient return to clinic to see MD/APP after evaluation has been completed in the emergency department.  Arles Rumbold, NP 11/12/2022, 2:50 PM

## 2022-11-13 DIAGNOSIS — D631 Anemia in chronic kidney disease: Secondary | ICD-10-CM | POA: Diagnosis present

## 2022-11-13 DIAGNOSIS — E039 Hypothyroidism, unspecified: Secondary | ICD-10-CM | POA: Diagnosis not present

## 2022-11-13 DIAGNOSIS — D649 Anemia, unspecified: Secondary | ICD-10-CM

## 2022-11-13 DIAGNOSIS — I10 Essential (primary) hypertension: Secondary | ICD-10-CM

## 2022-11-13 DIAGNOSIS — I4811 Longstanding persistent atrial fibrillation: Secondary | ICD-10-CM

## 2022-11-13 DIAGNOSIS — I25119 Atherosclerotic heart disease of native coronary artery with unspecified angina pectoris: Secondary | ICD-10-CM

## 2022-11-13 DIAGNOSIS — E782 Mixed hyperlipidemia: Secondary | ICD-10-CM

## 2022-11-13 DIAGNOSIS — K922 Gastrointestinal hemorrhage, unspecified: Secondary | ICD-10-CM

## 2022-11-13 LAB — IRON AND TIBC
Iron: 42 ug/dL (ref 28–170)
Saturation Ratios: 11 % (ref 10.4–31.8)
TIBC: 371 ug/dL (ref 250–450)
UIBC: 329 ug/dL

## 2022-11-13 LAB — COMPREHENSIVE METABOLIC PANEL
ALT: 13 U/L (ref 0–44)
AST: 18 U/L (ref 15–41)
Albumin: 3.2 g/dL — ABNORMAL LOW (ref 3.5–5.0)
Alkaline Phosphatase: 58 U/L (ref 38–126)
Anion gap: 6 (ref 5–15)
BUN: 31 mg/dL — ABNORMAL HIGH (ref 8–23)
CO2: 24 mmol/L (ref 22–32)
Calcium: 8.5 mg/dL — ABNORMAL LOW (ref 8.9–10.3)
Chloride: 109 mmol/L (ref 98–111)
Creatinine, Ser: 1.01 mg/dL — ABNORMAL HIGH (ref 0.44–1.00)
GFR, Estimated: 54 mL/min — ABNORMAL LOW (ref >60)
Glucose, Bld: 83 mg/dL (ref 70–99)
Potassium: 4 mmol/L (ref 3.5–5.1)
Sodium: 139 mmol/L (ref 135–145)
Total Bilirubin: 0.8 mg/dL (ref 0.3–1.2)
Total Protein: 6.4 g/dL — ABNORMAL LOW (ref 6.5–8.1)

## 2022-11-13 LAB — CBC WITH DIFFERENTIAL/PLATELET
Abs Immature Granulocytes: 0.03 10*3/uL (ref 0.00–0.07)
Basophils Absolute: 0.1 10*3/uL (ref 0.0–0.1)
Basophils Relative: 1 %
Eosinophils Absolute: 0.2 10*3/uL (ref 0.0–0.5)
Eosinophils Relative: 3 %
HCT: 25.8 % — ABNORMAL LOW (ref 36.0–46.0)
Hemoglobin: 8.3 g/dL — ABNORMAL LOW (ref 12.0–15.0)
Immature Granulocytes: 0 %
Lymphocytes Relative: 18 %
Lymphs Abs: 1.7 10*3/uL (ref 0.7–4.0)
MCH: 28.3 pg (ref 26.0–34.0)
MCHC: 32.2 g/dL (ref 30.0–36.0)
MCV: 88.1 fL (ref 80.0–100.0)
Monocytes Absolute: 0.8 10*3/uL (ref 0.1–1.0)
Monocytes Relative: 9 %
Neutro Abs: 6.7 10*3/uL (ref 1.7–7.7)
Neutrophils Relative %: 69 %
Platelets: 233 10*3/uL (ref 150–400)
RBC: 2.93 MIL/uL — ABNORMAL LOW (ref 3.87–5.11)
RDW: 15.3 % (ref 11.5–15.5)
WBC: 9.6 10*3/uL (ref 4.0–10.5)
nRBC: 0 % (ref 0.0–0.2)

## 2022-11-13 LAB — PREPARE RBC (CROSSMATCH)

## 2022-11-13 LAB — MAGNESIUM: Magnesium: 2 mg/dL (ref 1.7–2.4)

## 2022-11-13 LAB — PROTIME-INR
INR: 4 — ABNORMAL HIGH (ref 0.8–1.2)
Prothrombin Time: 38.4 seconds — ABNORMAL HIGH (ref 11.4–15.2)

## 2022-11-13 LAB — FOLATE: Folate: >40 ng/mL (ref >5.9)

## 2022-11-13 LAB — ABO/RH: ABO/RH(D): O POS

## 2022-11-13 MED ORDER — EZETIMIBE 10 MG PO TABS: 10.0000 mg | ORAL_TABLET | Freq: Every day | ORAL | Status: DC

## 2022-11-13 MED ORDER — ONDANSETRON HCL 4 MG/2ML IJ SOLN: 4.0000 mg | Freq: Four times a day (QID) | INTRAMUSCULAR | Status: DC | PRN

## 2022-11-13 MED ORDER — LOSARTAN POTASSIUM 50 MG PO TABS
100.0000 mg | ORAL_TABLET | Freq: Every day | ORAL | Status: DC
  Administered 2022-11-13 – 2022-11-15 (×3): 100 mg via ORAL
  Filled 2022-11-13 (×3): qty 2

## 2022-11-13 MED ORDER — PHYTONADIONE 5 MG PO TABS
5.0000 mg | ORAL_TABLET | Freq: Once | ORAL | Status: DC
  Filled 2022-11-13: qty 1

## 2022-11-13 MED ORDER — VITAMIN K1 10 MG/ML IJ SOLN
5.0000 mg | Freq: Once | INTRAVENOUS | Status: AC
  Administered 2022-11-13: 5 mg via INTRAVENOUS
  Filled 2022-11-13: qty 0.5

## 2022-11-13 MED ORDER — AMIODARONE HCL 200 MG PO TABS
200.0000 mg | ORAL_TABLET | Freq: Every day | ORAL | Status: DC
  Administered 2022-11-13 – 2022-11-15 (×3): 200 mg via ORAL
  Filled 2022-11-13 (×3): qty 1

## 2022-11-13 MED ORDER — ONDANSETRON HCL 4 MG PO TABS: 4.0000 mg | ORAL_TABLET | Freq: Four times a day (QID) | ORAL | Status: DC | PRN

## 2022-11-13 MED ORDER — PANTOPRAZOLE INFUSION (NEW) - SIMPLE MED
8.0000 mg/h | INTRAVENOUS | Status: DC
  Administered 2022-11-13 – 2022-11-14 (×2): 8 mg/h via INTRAVENOUS
  Filled 2022-11-13 (×3): qty 100

## 2022-11-13 MED ORDER — OXYCODONE HCL 5 MG PO TABS: 5.0000 mg | ORAL_TABLET | ORAL | Status: DC | PRN

## 2022-11-13 MED ORDER — ACETAMINOPHEN 325 MG PO TABS: 650.0000 mg | ORAL_TABLET | Freq: Four times a day (QID) | ORAL | Status: DC | PRN

## 2022-11-13 MED ORDER — GABAPENTIN 100 MG PO CAPS: 100.0000 mg | ORAL_CAPSULE | Freq: Three times a day (TID) | ORAL | Status: DC | PRN

## 2022-11-13 MED ORDER — ACETAMINOPHEN 650 MG RE SUPP: 650.0000 mg | Freq: Four times a day (QID) | RECTAL | Status: DC | PRN

## 2022-11-13 MED ORDER — LEVOTHYROXINE SODIUM 50 MCG PO TABS
100.0000 ug | ORAL_TABLET | Freq: Every day | ORAL | Status: DC
  Administered 2022-11-13 – 2022-11-16 (×4): 100 ug via ORAL
  Filled 2022-11-13 (×4): qty 2

## 2022-11-13 MED ORDER — METOPROLOL SUCCINATE ER 50 MG PO TB24
50.0000 mg | ORAL_TABLET | Freq: Every day | ORAL | Status: DC
  Administered 2022-11-13 – 2022-11-15 (×3): 50 mg via ORAL
  Filled 2022-11-13 (×3): qty 1

## 2022-11-13 NOTE — Assessment & Plan Note (Addendum)
-   Patient has dyspnea even just from talking - Hemoglobin acutely dropped from 11.2>> 7.6.  Given report of melena, concern for upper GI bleed. - 1 unit packed red blood cells ordered at admission Plan: --hold Plavix, warfarin --monitor Hgb and transfuse to keep Hgb >8

## 2022-11-13 NOTE — Assessment & Plan Note (Signed)
Continue Zetia. °

## 2022-11-13 NOTE — Assessment & Plan Note (Addendum)
-   Melena for 1 week.  May have upper GI bleed which is exacerbated by supratherapeutic INR (4.0) and plavix - Hemoglobin dropped from 11.2>> 7.6 - Switch Protonix drip to oral Protonix twice a day -INR 2.5 today --hold home warfarin and plavix till colonoscopy on Tuesday -Stable H&H for now

## 2022-11-13 NOTE — Assessment & Plan Note (Addendum)
-   Hold Plavix in the setting of GI bleed.

## 2022-11-13 NOTE — H&P (Signed)
History and Physical    Patient: Julie Phillips JJK:[id number] DOB: Oct 01, 1932 DOA: 11/12/2022 DOS: the patient was seen and examined on 11/13/2022 PCP: Tonia Ghent, MD  Patient coming from: Home  Chief Complaint:  Chief Complaint  Patient presents with   Shortness of Breath   HPI: Julie Phillips is a 88 y.o. female with medical history significant of CHF, GERD, hyperlipidemia, hypertension, hypothyroidism, atrial fibrillation, and more presents the ER with a chief complaint of dyspnea, fatigue, and feeling faint.  Patient reports it all started 5 days ago.  The symptoms are worse with exertion.  She does sometimes feel them at rest, but not as badly.  Today when she was at the cardiology office she was resting when the symptoms came on.  She reports she felt flushed in her face, and then down her body.  She denies chest pain, palpitations.  She did become short of breath.  Patient reports that she is in no pain.  Talking is enough exertion to make the shortness of breath worse.  Patient denies nausea and vomiting.  She does report that she has had a reduced appetite for at least 2 weeks.  She has been having to force herself to eat.  She is really discouraged about this hospitalization as she was feeling really good 8 days ago, which she has energy and was productive, and this hit immediately after.  Patient has no other complaints at this time.  Patient does not smoke, does not drink, does not use illicit drugs.  She is vaccinated for COVID.  Patient is DNR. Review of Systems: As mentioned in the history of present illness. All other systems reviewed and are negative. Past Medical History:  Diagnosis Date   Arrhythmia    atrial fibrillation   CHF (congestive heart failure) (HCC)    Chronic heart failure with preserved ejection fraction (HFpEF) ([place])    a. 01/2022 Echo: EF 65-70%, no rwma, mild LVH, GrII DD, nl RV fxn, mildly dil LA, mild MR, ao scelrosis.   Fall    GERD  (gastroesophageal reflux disease)    History of chicken pox    Hx of colonic polyps    Hyperlipidemia    Hypertension    Hypothyroidism    Keratopathy 2018   R eye   LVH (left ventricular hypertrophy)    Persistent atrial fibrillation ([place])    a. Initially dx in 2012; b. 01/2022 recurrent AF rvr noted-->warfarin added (CHA2DS2VASc = 5); c. 03/2022 Amio added-->DCCV x 1 (150J).   Past Surgical History:  Procedure Laterality Date   CARDIOVASCULAR STRESS TEST  2011   normal    CARDIOVERSION N/A 04/02/2022   Procedure: CARDIOVERSION;  Surgeon: Minna Merritts, MD;  Location: [facility] ORS;  Service: Cardiovascular;  Laterality: N/A;   CORONARY STENT INTERVENTION N/A 05/06/2022   Procedure: CORONARY STENT INTERVENTION;  Surgeon: Wellington Hampshire, MD;  Location: [place] [facility];  Service: Cardiovascular;  Laterality: N/A;   keratopathy     LEFT HEART CATH AND CORONARY ANGIOGRAPHY N/A 05/06/2022   Procedure: LEFT HEART CATH AND CORONARY ANGIOGRAPHY;  Surgeon: Wellington Hampshire, MD;  Location: [place] [facility];  Service: Cardiovascular;  Laterality: N/A;   Social History:  reports that she quit smoking about 50 years ago. Her smoking use included cigarettes. She has a 40.00 pack-year smoking history. She has never used smokeless tobacco. She reports that she does not currently use alcohol. She reports that she does not use drugs.  Allergies  Allergen Reactions   Statins     Myalgias   Wheat     Throat swelling   Ezetimibe Other (See Comments)    Extreme fatigue   Jardiance [Empagliflozin] Other (See Comments)    Dry mouth, "gasping for breath"   Amlodipine     hematuria   Eucalyptus Oil    Lisinopril     Unrecalled BP med- possibly lisinopril- intolerant, had cough on the medicine- changed to cozaar   Rice Nausea And Vomiting    Family History  Problem Relation Age of Onset   Heart disease Mother    Heart disease Father    Breast cancer Sister    Breast cancer Daughter     Colon cancer Neg Hx     Prior to Admission medications   Medication Sig Start Date End Date Taking? Authorizing Provider  clopidogrel (PLAVIX) 75 MG tablet Take 1 tablet (75 mg total) by mouth daily with breakfast. 05/07/22  Yes Hammock, Sheri, NP  diltiazem (CARDIZEM CD) 120 MG 24 hr capsule Take 1 capsule (120 mg total) by mouth daily. 03/31/22  Yes Gollan, Kathlene November, MD  furosemide (LASIX) 20 MG tablet TAKE 1 TABLET BY MOUTH 2 TIMES DAILY 10/18/22  Yes Hammock, Sheri, NP  levothyroxine (SYNTHROID) 100 MCG tablet TAKE 1 TABLET BY MOUTH ONCE A DAY BEFOREBREAKFAST. 07/01/22  Yes Tonia Ghent, MD  losartan (COZAAR) 100 MG tablet Take 1 tablet (100 mg total) by mouth daily. 03/01/22  Yes Minna Merritts, MD  LOSARTAN POTASSIUM PO Apply 0.8 mg to eye in the morning, at noon, in the evening, and at bedtime. 09/29/22  Yes [provider]  metoprolol succinate (TOPROL-XL) 50 MG 24 hr tablet Take 1 tablet (50 mg) by mouth once daily. Take with or immediately following a meal. 10/22/22  Yes Hammock, Sheri, NP  pantoprazole (PROTONIX) 20 MG tablet Take 1 tablet (20 mg total) by mouth daily. 07/01/22  Yes Tonia Ghent, MD  potassium chloride (KLOR-CON) 10 MEQ tablet TAKE 1 TABLET BY MOUTH ONCE A DAY 08/31/22  Yes Tonia Ghent, MD  prednisoLONE acetate (PRED FORTE) 1 % ophthalmic suspension Place 1 drop into the right eye 2 (two) times daily. 09/29/22  Yes [provider]  warfarin (COUMADIN) 5 MG tablet TAKE 1 TABLET (5 MG) BY MOUTH DAILY EXCEPT TAKE 1/2 TABLET (2.5 MG) ON MONDAYS AND THURSDAYS OR AS DIRECTED BY ANTICOAGULATION CLINIC 05/21/22  Yes Tonia Ghent, MD  amiodarone (PACERONE) 200 MG tablet Take 1 tablet (200 mg total) by mouth daily. Patient not taking: Reported on 11/12/2022 04/09/22   Theora Gianotti, NP  ezetimibe (ZETIA) 10 MG tablet Take 10 mg by mouth daily. Patient not taking: Reported on 11/12/2022    [provider]  gabapentin (NEURONTIN) 100  MG capsule TAKE 1 CAPSULE BY MOUTH 3 TIMES DAILY ASNEEDED 01/29/21   Tonia Ghent, MD  nitroGLYCERIN (NITROSTAT) 0.4 MG SL tablet Place 1 tablet (0.4 mg total) under the tongue every 5 (five) minutes as needed for chest pain. 01/01/22   Minna Merritts, MD  omeprazole (PRILOSEC) 20 MG capsule Take 20 mg by mouth daily. TAKES PRN    [provider]  Polyethyl Glycol-Propyl Glycol (SYSTANE) 0.4-0.3 % GEL ophthalmic gel Place 1 application into both eyes.    [provider]  traZODone (DESYREL) 50 MG tablet Take 0.5-1 tablets (25-50 mg total) by mouth at bedtime as needed for sleep. Hold for patient request. 10/01/22  Tonia Ghent, MD    Physical Exam: Vitals:   11/12/22 2045 11/12/22 2100 11/12/22 2200 11/12/22 2300  BP:  (!) 130/103 (!) 129/55 (!) 117/35  Pulse:  68 63 64  Resp:  '18 16 14  '$ Temp:      TempSrc:      SpO2: 100% 100% 100% 96%  Weight:      Height:       1.  General: Patient lying supine in bed,  no acute distress   2. Psychiatric: Alert and oriented x 3, mood and behavior normal for situation, pleasant and cooperative with exam   3. Neurologic: Speech and language are normal, face is symmetric, moves all 4 extremities voluntarily, at baseline without acute deficits on limited exam   4. HEENMT:  Head is atraumatic, normocephalic, pupils reactive to light, neck is supple, trachea is midline, mucous membranes are moist   5. Respiratory : Lungs are clear to auscultation bilaterally without wheezing, rhonchi, rales, no cyanosis, no increase in work of breathing or accessory muscle use   6. Cardiovascular : Heart rate normal, rhythm is regular, no murmurs, rubs or gallops, no peripheral edema, peripheral pulses palpated   7. Gastrointestinal:  Abdomen is soft, nondistended, minimally tender to palpation in the lower left and right quadrant bowel sounds active, no masses or organomegaly palpated   8. Skin:  Skin is warm, dry and intact without  rashes, acute lesions, or ulcers on limited exam   9.Musculoskeletal:  No acute deformities or trauma, no asymmetry in tone, no peripheral edema, peripheral pulses palpated, no tenderness to palpation in the extremities  Data Reviewed: The ED Temp 98, heart rate 61-68, respiratory rate 15-18, blood pressure 102/40-133/103, satting at 100% No leukocytosis with white blood cell count of 10.5, hemoglobin 7.6 Chemistry is unremarkable BNP 188.1 Trope 4, 4, Patient was typed and screened Chest x-ray shows no active disease EKG shows a heart rate of 61, normal sinus rhythm, QTc 467 Admission requested for symptomatic anemia secondary to GI bleed  Assessment and Plan: * GI bleed - Melena for 1 week - Hemoglobin dropped from 11.2>> 7.6 - Started on Protonix in the ER - Type and screen - 1 unit ordered, recheck CBC at 5 AM - GI consulted but may need to be called - Continue monitor  Essential hypertension - Continue losartan and metoprolol  Hypothyroid - Continue Synthroid  Symptomatic anemia - Patient has dyspnea even just from talking - Hemoglobin acutely dropped from 11.2>> 7.6 - 1 unit packed red blood cells ordered at admission - Patient has been on Plavix, warfarin; hold both of these - She denies NSAIDs - Continue Protonix - Continue monitor  Coronary artery disease involving native coronary artery of native heart with angina pectoris (HCC) - Continue ARB, beta-blocker - Hold Plavix in the setting of GI bleed - Continue to monitor  Atrial fibrillation (HCC) - Continue amiodarone - Continue metoprolol - Hold warfarin in the setting of GI bleed - Hold diltiazem in the setting of soft pressures down to 102/40 - Continue to monitor  Mixed hyperlipidemia - Continue Zetia      Advance Care Planning:   Code Status: DNR   Consults: GI consult ordered but may need to be called  Family Communication: No family at bedside  Severity of Illness: The appropriate  patient status for this patient is INPATIENT. Inpatient status is judged to be reasonable and necessary in order to provide the required intensity of service to ensure the patient's  safety. The patient's presenting symptoms, physical exam findings, and initial radiographic and laboratory data in the context of their chronic comorbidities is felt to place them at high risk for further clinical deterioration. Furthermore, it is not anticipated that the patient will be medically stable for discharge from the hospital within 2 midnights of admission.   * I certify that at the point of admission it is my clinical judgment that the patient will require inpatient hospital care spanning beyond 2 midnights from the point of admission due to high intensity of service, high risk for further deterioration and high frequency of surveillance required.*  Author: Rolla Plate, DO 11/13/2022 12:26 AM  For on call review [email or web address].

## 2022-11-13 NOTE — Assessment & Plan Note (Signed)
Continue Synthroid °

## 2022-11-13 NOTE — Consult Note (Signed)
Consultation  Referring Provider:     Hospitalist Admit date: 11/12/2022 Consult date: 11/13/2022         Reason for Consultation:    Melena          HPI:   Julie Phillips is a 88 y.o. lady with history of HFpEF, a.fib on coumadin, CAD with stent about 7 months ago for which she takes plavix, and obesity here with melena and anemia. Patient states she has been having melena for a few days. Her last dose of plavix was Thursday and last dose of coumadin was Thursday. INR today is 4. Was given one unit of pRBC. Last colonoscopy in 2014 with some polyps. No family history of GI malignancies. No significant abdominal surgeries. She has a history of severe GERD for which she takes a PPI. No NSAIDS.  Past Medical History:  Diagnosis Date   Arrhythmia    atrial fibrillation   CHF (congestive heart failure) (HCC)    Chronic heart failure with preserved ejection fraction (HFpEF) ([place])    a. 01/2022 Echo: EF 65-70%, no rwma, mild LVH, GrII DD, nl RV fxn, mildly dil LA, mild MR, ao scelrosis.   Fall    GERD (gastroesophageal reflux disease)    History of chicken pox    Hx of colonic polyps    Hyperlipidemia    Hypertension    Hypothyroidism    Keratopathy 2018   R eye   LVH (left ventricular hypertrophy)    Persistent atrial fibrillation ([place])    a. Initially dx in 2012; b. 01/2022 recurrent AF rvr noted-->warfarin added (CHA2DS2VASc = 5); c. 03/2022 Amio added-->DCCV x 1 (150J).    Past Surgical History:  Procedure Laterality Date   CARDIOVASCULAR STRESS TEST  2011   normal    CARDIOVERSION N/A 04/02/2022   Procedure: CARDIOVERSION;  Surgeon: Minna Merritts, MD;  Location: [facility];  Service: Cardiovascular;  Laterality: N/A;   CORONARY STENT INTERVENTION N/A 05/06/2022   Procedure: CORONARY STENT INTERVENTION;  Surgeon: Wellington Hampshire, MD;  Location: [place] [facility];  Service: Cardiovascular;  Laterality: N/A;   keratopathy     LEFT HEART CATH AND CORONARY ANGIOGRAPHY N/A 05/06/2022    Procedure: LEFT HEART CATH AND CORONARY ANGIOGRAPHY;  Surgeon: Wellington Hampshire, MD;  Location: [place] [facility];  Service: Cardiovascular;  Laterality: N/A;    Family History  Problem Relation Age of Onset   Heart disease Mother    Heart disease Father    Breast cancer Sister    Breast cancer Daughter    Colon cancer Neg Hx     Social History   Tobacco Use   Smoking status: Former    Packs/day: 2.00    Years: 20.00    Total pack years: 40.00    Types: Cigarettes    Quit date: 12/14/1971    Years since quitting: 50.9   Smokeless tobacco: Never  Vaping Use   Vaping Use: Never used  Substance Use Topics   Alcohol use: Not Currently   Drug use: No    Prior to Admission medications   Medication Sig Start Date End Date Taking? Authorizing Provider  clopidogrel (PLAVIX) 75 MG tablet Take 1 tablet (75 mg total) by mouth daily with breakfast. 05/07/22  Yes Hammock, Sheri, NP  diltiazem (CARDIZEM CD) 120 MG 24 hr capsule Take 1 capsule (120 mg total) by mouth daily. 03/31/22  Yes Gollan, Kathlene November, MD  furosemide (LASIX) 20 MG tablet TAKE 1 TABLET BY  MOUTH 2 TIMES DAILY 10/18/22  Yes Hammock, Sheri, NP  levothyroxine (SYNTHROID) 100 MCG tablet TAKE 1 TABLET BY MOUTH ONCE A DAY BEFOREBREAKFAST. 07/01/22  Yes Tonia Ghent, MD  losartan (COZAAR) 100 MG tablet Take 1 tablet (100 mg total) by mouth daily. 03/01/22  Yes Minna Merritts, MD  LOSARTAN POTASSIUM PO Apply 0.8 mg to eye in the morning, at noon, in the evening, and at bedtime. 09/29/22  Yes [provider]  metoprolol succinate (TOPROL-XL) 50 MG 24 hr tablet Take 1 tablet (50 mg) by mouth once daily. Take with or immediately following a meal. 10/22/22  Yes Hammock, Sheri, NP  pantoprazole (PROTONIX) 20 MG tablet Take 1 tablet (20 mg total) by mouth daily. 07/01/22  Yes Tonia Ghent, MD  potassium chloride (KLOR-CON) 10 MEQ tablet TAKE 1 TABLET BY MOUTH ONCE A DAY 08/31/22  Yes Tonia Ghent, MD  prednisoLONE  acetate (PRED FORTE) 1 % ophthalmic suspension Place 1 drop into the right eye 2 (two) times daily. 09/29/22  Yes [provider]  warfarin (COUMADIN) 5 MG tablet TAKE 1 TABLET (5 MG) BY MOUTH DAILY EXCEPT TAKE 1/2 TABLET (2.5 MG) ON MONDAYS AND THURSDAYS OR AS DIRECTED BY ANTICOAGULATION CLINIC 05/21/22  Yes Tonia Ghent, MD  amiodarone (PACERONE) 200 MG tablet Take 1 tablet (200 mg total) by mouth daily. Patient not taking: Reported on 11/12/2022 04/09/22   Theora Gianotti, NP  ezetimibe (ZETIA) 10 MG tablet Take 10 mg by mouth daily. Patient not taking: Reported on 11/12/2022    [provider]  gabapentin (NEURONTIN) 100 MG capsule TAKE 1 CAPSULE BY MOUTH 3 TIMES DAILY ASNEEDED 01/29/21   Tonia Ghent, MD  nitroGLYCERIN (NITROSTAT) 0.4 MG SL tablet Place 1 tablet (0.4 mg total) under the tongue every 5 (five) minutes as needed for chest pain. 01/01/22   Minna Merritts, MD  omeprazole (PRILOSEC) 20 MG capsule Take 20 mg by mouth daily. TAKES PRN    [provider]  Polyethyl Glycol-Propyl Glycol (SYSTANE) 0.4-0.3 % GEL ophthalmic gel Place 1 application into both eyes.    [provider]  traZODone (DESYREL) 50 MG tablet Take 0.5-1 tablets (25-50 mg total) by mouth at bedtime as needed for sleep. Hold for patient request. 10/01/22   Tonia Ghent, MD    Current Facility-Administered Medications  Medication Dose Route Frequency Provider Last Rate Last Admin   0.9 %  sodium chloride infusion (Manually program via Guardrails IV Fluids)   Intravenous Once Zierle-Ghosh, Asia B, DO   Held at 11/12/22 2257   acetaminophen (TYLENOL) tablet 650 mg  650 mg Oral Q6H PRN Zierle-Ghosh, Asia B, DO       Or   acetaminophen (TYLENOL) suppository 650 mg  650 mg Rectal Q6H PRN Zierle-Ghosh, Asia B, DO       amiodarone (PACERONE) tablet 200 mg  200 mg Oral Daily Zierle-Ghosh, Asia B, DO   200 mg at 11/13/22 1040   gabapentin (NEURONTIN) capsule 100 mg  100 mg  Oral TID PRN Zierle-Ghosh, Asia B, DO       levothyroxine (SYNTHROID) tablet 100 mcg  100 mcg Oral Q0600 Zierle-Ghosh, Asia B, DO   100 mcg at 11/13/22 0559   losartan (COZAAR) tablet 100 mg  100 mg Oral Daily Zierle-Ghosh, Asia B, DO   100 mg at 11/13/22 1040   metoprolol succinate (TOPROL-XL) 24 hr tablet 50 mg  50 mg Oral Daily Zierle-Ghosh, Asia B, DO   50  mg at 11/13/22 1040   ondansetron (ZOFRAN) tablet 4 mg  4 mg Oral Q6H PRN Zierle-Ghosh, Asia B, DO       Or   ondansetron (ZOFRAN) injection 4 mg  4 mg Intravenous Q6H PRN Zierle-Ghosh, Asia B, DO       oxyCODONE (Oxy IR/ROXICODONE) immediate release tablet 5 mg  5 mg Oral Q4H PRN Zierle-Ghosh, Asia B, DO       [START ON 11/16/2022] pantoprazole (PROTONIX) injection 40 mg  40 mg Intravenous Q12H Zierle-Ghosh, Asia B, DO       phytonadione (VITAMIN K) 5 mg in dextrose 5 % 50 mL IVPB  5 mg Intravenous Once Lesly Rubenstein, MD       traZODone (DESYREL) tablet 25-50 mg  25-50 mg Oral QHS PRN Zierle-Ghosh, Asia B, DO       Current Outpatient Medications  Medication Sig Dispense Refill   clopidogrel (PLAVIX) 75 MG tablet Take 1 tablet (75 mg total) by mouth daily with breakfast. 30 tablet 6   diltiazem (CARDIZEM CD) 120 MG 24 hr capsule Take 1 capsule (120 mg total) by mouth daily. 90 capsule 3   furosemide (LASIX) 20 MG tablet TAKE 1 TABLET BY MOUTH 2 TIMES DAILY 180 tablet 0   levothyroxine (SYNTHROID) 100 MCG tablet TAKE 1 TABLET BY MOUTH ONCE A DAY BEFOREBREAKFAST. 90 tablet 3   losartan (COZAAR) 100 MG tablet Take 1 tablet (100 mg total) by mouth daily. 90 tablet 3   LOSARTAN POTASSIUM PO Apply 0.8 mg to eye in the morning, at noon, in the evening, and at bedtime.     metoprolol succinate (TOPROL-XL) 50 MG 24 hr tablet Take 1 tablet (50 mg) by mouth once daily. Take with or immediately following a meal. 180 tablet 0   pantoprazole (PROTONIX) 20 MG tablet Take 1 tablet (20 mg total) by mouth daily. 90 tablet 3   potassium chloride  (KLOR-CON) 10 MEQ tablet TAKE 1 TABLET BY MOUTH ONCE A DAY 90 tablet 1   prednisoLONE acetate (PRED FORTE) 1 % ophthalmic suspension Place 1 drop into the right eye 2 (two) times daily.     warfarin (COUMADIN) 5 MG tablet TAKE 1 TABLET (5 MG) BY MOUTH DAILY EXCEPT TAKE 1/2 TABLET (2.5 MG) ON MONDAYS AND THURSDAYS OR AS DIRECTED BY ANTICOAGULATION CLINIC 110 tablet 1   amiodarone (PACERONE) 200 MG tablet Take 1 tablet (200 mg total) by mouth daily. (Patient not taking: Reported on 11/12/2022) 90 tablet 3   ezetimibe (ZETIA) 10 MG tablet Take 10 mg by mouth daily. (Patient not taking: Reported on 11/12/2022)     gabapentin (NEURONTIN) 100 MG capsule TAKE 1 CAPSULE BY MOUTH 3 TIMES DAILY ASNEEDED 270 capsule 3   nitroGLYCERIN (NITROSTAT) 0.4 MG SL tablet Place 1 tablet (0.4 mg total) under the tongue every 5 (five) minutes as needed for chest pain. 25 tablet 3   omeprazole (PRILOSEC) 20 MG capsule Take 20 mg by mouth daily. TAKES PRN     Polyethyl Glycol-Propyl Glycol (SYSTANE) 0.4-0.3 % GEL ophthalmic gel Place 1 application into both eyes.     traZODone (DESYREL) 50 MG tablet Take 0.5-1 tablets (25-50 mg total) by mouth at bedtime as needed for sleep. Hold for patient request. 30 tablet 1    Allergies as of 11/12/2022 - Review Complete 11/12/2022  Allergen Reaction Noted   Statins  01/06/2012   Wheat  01/06/2012   Ezetimibe Other (See Comments) 07/14/2022   Jardiance [empagliflozin] Other (See Comments) 07/14/2022  Amlodipine  01/06/2012   Eucalyptus oil  11/11/2017   Lisinopril  12/26/2012   Rice Nausea And Vomiting 09/25/2020     Review of Systems:    All systems reviewed and negative except where noted in HPI.  Review of Systems  Constitutional:  Positive for malaise/fatigue. Negative for chills and fever.  Respiratory:  Negative for shortness of breath.   Cardiovascular:  Negative for chest pain.  Gastrointestinal:  Positive for melena. Negative for abdominal pain, nausea and  vomiting.  Musculoskeletal:  Negative for joint pain.  Skin:  Negative for rash.  Neurological:  Negative for weakness.  Psychiatric/Behavioral:  Negative for substance abuse.       Physical Exam:  Vital signs in last 24 hours: Temp:  [97.7 F (36.5 C)-98.1 F (36.7 C)] 97.7 F (36.5 C) (12/02 1043) Pulse Rate:  [56-71] 71 (12/02 1040) Resp:  [11-18] 15 (12/02 1000) BP: (102-144)/(35-103) 130/64 (12/02 1040) SpO2:  [95 %-100 %] 98 % (12/02 1000) Weight:  [101.2 kg] 101.2 kg (12/01 1544)   General:   Pleasant in NAD Head:  Normocephalic and atraumatic. Eyes:   No icterus.   Conjunctiva pink. Mouth: Mucosa pink moist, no lesions. Neck:  Supple; no masses felt Lungs:  No respiratory distress Abdomen:   Flat, soft, nondistended, nontender. Msk:  MAEW x4, No clubbing or cyanosis. Strength 5/5. Symmetrical without gross deformities. Neurologic:  Alert and  oriented x4;  Cranial nerves II-XII intact.  Skin:  Warm, dry, pink without significant lesions or rashes. Psych:  Alert and cooperative. Normal affect.  LAB RESULTS: Recent Labs    11/12/22 1548 11/13/22 0556  WBC 10.5 9.6  HGB 7.6* 8.3*  HCT 25.2* 25.8*  PLT 311 233   BMET Recent Labs    11/12/22 1548 11/13/22 0556  NA 138 139  K 4.3 4.0  CL 108 109  CO2 24 24  GLUCOSE 92 83  BUN 31* 31*  CREATININE 1.22* 1.01*  CALCIUM 9.1 8.5*   LFT Recent Labs    11/13/22 0556  PROT 6.4*  ALBUMIN 3.2*  AST 18  ALT 13  ALKPHOS 58  BILITOT 0.8   PT/INR Recent Labs    11/13/22 1045  LABPROT 38.4*  INR 4.0*    STUDIES: DG Chest 2 View  Result Date: 11/12/2022 CLINICAL DATA:  Shortness of breath EXAM: CHEST - 2 VIEW COMPARISON:  09/21/2022 FINDINGS: The heart size and mediastinal contours are within normal limits. Aortic atherosclerosis. No focal airspace consolidation, pleural effusion, or pneumothorax. The visualized skeletal structures are unremarkable. IMPRESSION: No active cardiopulmonary disease.  Electronically Signed   By: Davina Poke D.O.   On: 11/12/2022 16:05       Impression / Plan:   88 y/o lady with history of HFpEF, a.fib on coumadin, CAD with stent about 7 months ago for which she takes plavix, and obesity here with melena and anemia  - given elevated INR, concern for GI bleeding but hemodynamically stable, will order IV vitamin K - two large bore IV's - transfuse to keep hemoglobin> 8 - PPI IV BID - clear liquids - NPO at midnight - will tentatively plan for EGD tomorrow if INR is < 2.5  Raylene Miyamoto MD, MPH [place]

## 2022-11-13 NOTE — Assessment & Plan Note (Signed)
-  Continue losartan and metoprolol 

## 2022-11-13 NOTE — Progress Notes (Signed)
  PROGRESS NOTE    Julie Phillips  VQM:[id number] DOB: Aug 28, 1932 DOA: 11/12/2022 PCP: Tonia Ghent, MD  160A/160A-AA  LOS: 1 day   Brief hospital course:   Assessment & Plan: Julie Phillips is a 88 y.o. female with medical history significant of CHF, GERD, hypertension, hypothyroidism, atrial fibrillation on warfarin, who presented with a chief complaint of dyspnea, fatigue, and feeling faint, found to have symptomatic anemia.   * Symptomatic anemia - Patient has dyspnea even just from talking - Hemoglobin acutely dropped from 11.2>> 7.6.  Given report of melena, concern for upper GI bleed. - 1 unit packed red blood cells ordered at admission Plan: --hold Plavix, warfarin --monitor Hgb and transfuse to keep Hgb >8  Essential hypertension - Continue losartan and metoprolol  Hypothyroid - Continue Synthroid  GI bleed - Melena for 1 week.  May have upper GI bleed which is exacerbated by supratherapeutic INR (4.0) and plavix - Hemoglobin dropped from 11.2>> 7.6 - Started on Protonix gtt in the ER Plan: --consult GI today --Pt refused vit K, so will need to wait till INR normalizes before GI procedure --cont protonix gtt --hold home warfarin and plavix  Coronary artery disease involving native coronary artery of native heart with angina pectoris (HCC) - Hold Plavix in the setting of GI bleed  Atrial fibrillation (HCC) - Continue amiodarone - Continue metoprolol - Hold warfarin in the setting of GI bleed - Hold diltiazem in the setting of soft pressures down to 102/40   DVT prophylaxis: SCD/Compression stockings Code Status: DNR  Family Communication: daughter updated at bedside today Level of care: Telemetry Medical Dispo:   The patient is from: home Anticipated d/c is to: home Anticipated d/c date is: 2-3 days Patient currently is not medically ready to d/c due to: pending GI procedure    Subjective and Interval History:  Per RN, pt had chest and abdominal  spasm after starting IV vit K infusion.  Vit K changed to oral form, which pt refused as well.   Objective: Vitals:   11/13/22 1300 11/13/22 1400 11/13/22 1500 11/13/22 1610  BP: (!) 134/46 (!) 143/35 131/87 (!) 126/38  Pulse: 64 72 65 67  Resp: '17 16 13 19  '$ Temp:    98 F (36.7 C)  TempSrc:      SpO2: 98% 100% 98% 98%  Weight:      Height:        Intake/Output Summary (Last 24 hours) at 11/13/2022 1655 Last data filed at 11/13/2022 0525 Gross per 24 hour  Intake 497.41 ml  Output --  Net 497.41 ml   Filed Weights   11/12/22 1544  Weight: 101.2 kg    Examination:   Constitutional: NAD, AAOx3 HEENT: conjunctivae and lids normal, EOMI CV: No cyanosis.   RESP: normal respiratory effort, on RA Neuro: II - XII grossly intact.   Psych: Normal mood and affect.  Appropriate judgement and reason   Data Reviewed: I have personally reviewed labs and imaging studies  Time spent: 50 minutes  Julie Bi, MD [facility] If 7PM-7AM, please contact night-coverage 11/13/2022, 4:55 PM

## 2022-11-13 NOTE — ED Notes (Addendum)
Pt states that she has pain in her  chest and abdomen, describes it as a spasm.  Vit K is disconnected and excess medication removed from IV extension.  MD made aware.

## 2022-11-13 NOTE — Assessment & Plan Note (Addendum)
-   Continue amiodarone - Continue metoprolol - Hold warfarin in the setting of GI bleed - Hold diltiazem in the setting of soft pressures down to 102/40

## 2022-11-13 NOTE — ED Notes (Signed)
Pt was calling on call bell and demanding that IV be removed and vitamin K stopped. Stating that "ever since the IV was put in, everything hurts and I can't breathe!". Informed pt that her respirations appear unlabored and SPO2 is 99% on room air. Pt responds "I don't care, I just want it out!". Paused infusion and called primary RN to inform. IV site does not appear infiltrated.

## 2022-11-14 ENCOUNTER — Inpatient Hospital Stay: Payer: PPO | Admitting: Anesthesiology

## 2022-11-14 ENCOUNTER — Encounter
Admission: EM | Disposition: A | Discharge status: Home Health | Payer: Self-pay | Source: Ambulatory Visit | Attending: Internal Medicine

## 2022-11-14 ENCOUNTER — Encounter: Payer: Self-pay | Admitting: Family Medicine

## 2022-11-14 DIAGNOSIS — I10 Essential (primary) hypertension: Secondary | ICD-10-CM | POA: Diagnosis not present

## 2022-11-14 DIAGNOSIS — E039 Hypothyroidism, unspecified: Secondary | ICD-10-CM | POA: Diagnosis not present

## 2022-11-14 DIAGNOSIS — K922 Gastrointestinal hemorrhage, unspecified: Secondary | ICD-10-CM | POA: Diagnosis not present

## 2022-11-14 DIAGNOSIS — D649 Anemia, unspecified: Secondary | ICD-10-CM | POA: Diagnosis not present

## 2022-11-14 HISTORY — PX: ESOPHAGOGASTRODUODENOSCOPY (EGD) WITH PROPOFOL: SHX5813

## 2022-11-14 LAB — BASIC METABOLIC PANEL
Anion gap: 3 — ABNORMAL LOW (ref 5–15)
BUN: 22 mg/dL (ref 8–23)
CO2: 25 mmol/L (ref 22–32)
Calcium: 9 mg/dL (ref 8.9–10.3)
Chloride: 111 mmol/L (ref 98–111)
Creatinine, Ser: 0.95 mg/dL (ref 0.44–1.00)
GFR, Estimated: 58 mL/min — ABNORMAL LOW (ref >60)
Glucose, Bld: 86 mg/dL (ref 70–99)
Potassium: 3.9 mmol/L (ref 3.5–5.1)
Sodium: 139 mmol/L (ref 135–145)

## 2022-11-14 LAB — PROTIME-INR
INR: 2.5 — ABNORMAL HIGH (ref 0.8–1.2)
Prothrombin Time: 26.4 seconds — ABNORMAL HIGH (ref 11.4–15.2)

## 2022-11-14 LAB — CBC
HCT: 25.8 % — ABNORMAL LOW (ref 36.0–46.0)
Hemoglobin: 8.4 g/dL — ABNORMAL LOW (ref 12.0–15.0)
MCH: 28.4 pg (ref 26.0–34.0)
MCHC: 32.6 g/dL (ref 30.0–36.0)
MCV: 87.2 fL (ref 80.0–100.0)
Platelets: 257 10*3/uL (ref 150–400)
RBC: 2.96 MIL/uL — ABNORMAL LOW (ref 3.87–5.11)
RDW: 15.6 % — ABNORMAL HIGH (ref 11.5–15.5)
WBC: 8.5 10*3/uL (ref 4.0–10.5)
nRBC: 0 % (ref 0.0–0.2)

## 2022-11-14 LAB — MAGNESIUM: Magnesium: 2.3 mg/dL (ref 1.7–2.4)

## 2022-11-14 LAB — VITAMIN B12: Vitamin B-12: 2032 pg/mL — ABNORMAL HIGH (ref 180–914)

## 2022-11-14 SURGERY — ESOPHAGOGASTRODUODENOSCOPY (EGD) WITH PROPOFOL
Anesthesia: General

## 2022-11-14 MED ORDER — PROPOFOL 10 MG/ML IV BOLUS
INTRAVENOUS | Status: DC | PRN
  Administered 2022-11-14: 30 mg via INTRAVENOUS
  Administered 2022-11-14 (×2): 20 mg via INTRAVENOUS

## 2022-11-14 MED ORDER — LIDOCAINE HCL (CARDIAC) PF 100 MG/5ML IV SOSY
PREFILLED_SYRINGE | INTRAVENOUS | Status: DC | PRN
  Administered 2022-11-14: 40 mg via INTRAVENOUS

## 2022-11-14 MED ORDER — PANTOPRAZOLE SODIUM 40 MG PO TBEC
40.0000 mg | DELAYED_RELEASE_TABLET | Freq: Two times a day (BID) | ORAL | Status: DC
  Administered 2022-11-14 – 2022-11-16 (×4): 40 mg via ORAL
  Filled 2022-11-14 (×4): qty 1

## 2022-11-14 NOTE — Progress Notes (Signed)
  Progress Note   Patient: Julie Phillips PVV:[id number] DOB: 12/28/1931 DOA: 11/12/2022     2 DOS: the patient was seen and examined on 11/14/2022   Brief hospital course: 88 y.o. female with medical history significant of CHF, GERD, hypertension, hypothyroidism, atrial fibrillation on warfarin, who presented with a chief complaint of dyspnea, fatigue, and feeling faint, found to have symptomatic anemia.  12/3: EGD unremarkable.  Colonoscopy planned for Tuesday to let Plavix and Coumadin washout  Assessment and Plan: * Symptomatic anemia - Patient has dyspnea even just from talking - Hemoglobin acutely dropped from 11.2>> 7.6.  Given report of melena, concern for upper GI bleed. - 1 unit packed red blood cells ordered at admission -Status post EGD on 12/3 -unremarkable, mild Schatzki's ring Plan: -- Continue to hold Plavix, warfarin.  Colonoscopy planned for Tuesday let Plavix and warfarin washout --monitor Hgb and transfuse to keep Hgb >8  Essential hypertension - Continue losartan and metoprolol.  Hypothyroid - Continue Synthroid.  GI bleed - Melena for 1 week.  May have upper GI bleed which is exacerbated by supratherapeutic INR (4.0) and plavix - Hemoglobin dropped from 11.2>> 7.6 - Switch Protonix drip to oral Protonix twice a day -INR 2.5 today --hold home warfarin and plavix till colonoscopy on Tuesday -Stable H&H for now  Coronary artery disease involving native coronary artery of native heart with angina pectoris (HCC) - Hold Plavix in the setting of GI bleed.  Atrial fibrillation (HCC) - Continue amiodarone - Continue metoprolol - Hold warfarin in the setting of GI bleed - Hold diltiazem in the setting of soft pressures         Subjective: No new complaints.  Would like to eat.  Returned from EGD earlier today  Physical Exam: Vitals:   11/14/22 0830 11/14/22 0845 11/14/22 0852 11/14/22 0959  BP: (!) 104/37 (!) 108/41 (!) 125/47 (!) 119/34  Pulse: 62 65  65 61  Resp: '15 17 16 18  '$ Temp: (!) 97.2 F (36.2 C)   98 F (36.7 C)  TempSrc:      SpO2: 95% 97% 97% 94%  Weight:      Height:       88 year old female lying in the bed comfortably without any acute distress Lungs clear to auscultation bilaterally Cardiovascular irregularly irregular heart rate and rhythm Abdomen soft, benign Neuro alert and awake, nonfocal Skin no rash or lesion Data Reviewed:  EGD unremarkable  Family Communication: None  Disposition: Status is: Inpatient Remains inpatient appropriate because: Colonoscopy planned for Tuesday 12/5 by GI  Planned Discharge Destination: Home   DVT prophylaxis-SCDs Time spent: 35 minutes  Author: Max Sane, MD 11/14/2022 2:18 PM  For on call review [email or web address].

## 2022-11-14 NOTE — Evaluation (Signed)
Physical Therapy Evaluation Patient Details Name: Julie Phillips MRN: [id number] DOB: 04/21/1932 Today's Date: 11/14/2022  History of Present Illness  Pt is an 88 y.o. female presenting to hospital 11/12/22 with c/o episodes of SOB (came from MD office); increased fatigue past week and noticed black stools.  Pt admitted with GI bleed and symptomatic anemia.  S/p upper GI endoscopy 12/3 (unremarkable).  PMH includes CHF, persistent a-fib, htn, HLD, hypothyroidism, hearing loss, obesity.  Clinical Impression  Prior to hospital admission, pt was modified independent with functional mobility (pt has a 3ww, two rollators, 2 canes, and 1 walking stick placed around home so she always has something around to use); lives alone in 1 level home with level entry.  No c/o pain during session.  Currently pt is modified independent semi-supine to sitting edge of bed; SBA with transfers using RW; and CGA to ambulate 40 feet with RW use.  Limited distance ambulating d/t SOB (O2 sats 95% or greater on room air during sessions activities).  Pt would benefit from skilled PT to address noted impairments and functional limitations (see below for any additional details).  Upon hospital discharge, pt would benefit from [place].    Recommendations for follow up therapy are one component of a multi-disciplinary discharge planning process, led by the attending physician.  Recommendations may be updated based on patient status, additional functional criteria and insurance authorization.  Follow Up Recommendations Home health PT      Assistance Recommended at Discharge PRN  Patient can return home with the following  A little help with walking and/or transfers;A little help with bathing/dressing/bathroom;Assistance with cooking/housework;Assist for transportation;Help with stairs or ramp for entrance    Equipment Recommendations  (pt prefers rollator use and does not want to use RW at home)  Recommendations for Other  Services       Functional Status Assessment Patient has had a recent decline in their functional status and demonstrates the ability to make significant improvements in function in a reasonable and predictable amount of time.     Precautions / Restrictions Precautions Precautions: Fall Restrictions Weight Bearing Restrictions: No      Mobility  Bed Mobility Overal bed mobility: Modified Independent             General bed mobility comments: Semi-supine to sitting without any noted difficulties    Transfers Overall transfer level: Needs assistance Equipment used: Rolling walker (2 wheels) Transfers: Sit to/from Stand Sit to Stand: Supervision           General transfer comment: mild increased effort to stand but steady using RW    Ambulation/Gait Ambulation/Gait assistance: Min guard Gait Distance (Feet): 40 Feet Assistive device: Rolling walker (2 wheels) Gait Pattern/deviations: Step-through pattern Gait velocity: decreased     General Gait Details: steady but slower pace ambulation with RW use  Stairs            Wheelchair Mobility    Modified Rankin (Stroke Patients Only)       Balance Overall balance assessment: Needs assistance Sitting-balance support: No upper extremity supported, Feet supported Sitting balance-Leahy Scale: Normal Sitting balance - Comments: steady sitting reaching within BOS   Standing balance support: Bilateral upper extremity supported, During functional activity Standing balance-Leahy Scale: Good Standing balance comment: no loss of balance with ambulation using RW                             Pertinent Vitals/Pain Pain  Assessment Pain Assessment: No/denies pain HR WFL during sessions activities.    Home Living Family/patient expects to be discharged to:: Private residence Living Arrangements: Alone Available Help at Discharge: Family (pt's daughter able to assist at night (after work)) Type of  Home: House (1 bedroom bungalow) Home Access: Level entry       [place]: One [place]: [place] (4 wheels);[place] - single point (2 rollators; 2 canes; 3ww; walking stick)      Prior Function Prior Level of Function : Independent/Modified Independent             Mobility Comments: (+) driving; no recent falls       Hand Dominance        Extremity/Trunk Assessment   Upper Extremity Assessment Upper Extremity Assessment: Generalized weakness    Lower Extremity Assessment Lower Extremity Assessment: Generalized weakness    Cervical / Trunk Assessment Cervical / Trunk Assessment: Other exceptions Cervical / Trunk Exceptions: forward head/shoulders  Communication   Communication: No difficulties  Cognition Arousal/Alertness: Awake/alert Behavior During Therapy: WFL for tasks assessed/performed Overall Cognitive Status: Within Functional Limits for tasks assessed                                          General Comments  Nursing cleared pt for participation in physical therapy.  Pt agreeable to PT session.    Exercises     Assessment/Plan    PT Assessment Patient needs continued PT services  PT Problem List Decreased strength;Decreased activity tolerance;Decreased balance;Decreased mobility;Cardiopulmonary status limiting activity       PT Treatment Interventions DME instruction;Gait training;Functional mobility training;Therapeutic activities;Therapeutic exercise;Balance training;Patient/family education    PT Goals (Current goals can be found in the Care Plan section)  Acute Rehab PT Goals Patient Stated Goal: to improve strength and mobility PT Goal Formulation: With patient Time For Goal Achievement: 11/28/22 Potential to Achieve Goals: Good    Frequency Min 2X/week     Co-evaluation               AM-PAC PT "6 Clicks" Mobility  Outcome Measure Help needed turning from your back to your side while in a flat  bed without using bedrails?: None Help needed moving from lying on your back to sitting on the side of a flat bed without using bedrails?: None Help needed moving to and from a bed to a chair (including a wheelchair)?: A Little Help needed standing up from a chair using your arms (e.g., wheelchair or bedside chair)?: A Little Help needed to walk in hospital room?: A Little Help needed climbing 3-5 steps with a railing? : A Little 6 Click Score: 20    End of Session Equipment Utilized During Treatment: Gait belt Activity Tolerance: Other (comment) (limited by SOB) Patient left: in chair;with call bell/phone within reach;with chair alarm set;with SCD's reapplied Nurse Communication: Mobility status;Precautions;Other (comment) (NT notified purewick not in place (pt did not feel she needed purewick in chair)) PT Visit Diagnosis: Other abnormalities of gait and mobility (R26.89);Muscle weakness (generalized) (M62.81)    Time: 5400-8676 PT Time Calculation (min) (ACUTE ONLY): 25 min   Charges:   PT Evaluation $PT Eval Low Complexity: 1 Low PT Treatments $Therapeutic Activity: 8-22 mins       Leitha Bleak, PT 11/14/22, 4:14 PM

## 2022-11-14 NOTE — Anesthesia Preprocedure Evaluation (Signed)
Anesthesia Evaluation  Patient identified by MRN, date of birth, ID band Patient awake    Reviewed: Allergy & Precautions, NPO status , Patient's Chart, lab work & pertinent test results  History of Anesthesia Complications Negative for: history of anesthetic complications  Airway Mallampati: III  TM Distance: <3 FB Neck ROM: full    Dental  (+) Chipped, Poor Dentition, Missing, Upper Dentures   Pulmonary shortness of breath and with exertion, former smoker   Pulmonary exam normal        Cardiovascular hypertension, (-) angina + CAD, + Cardiac Stents and +CHF  + dysrhythmias Atrial Fibrillation      Neuro/Psych  Neuromuscular disease  negative psych ROS   GI/Hepatic Neg liver ROS,GERD  Controlled,,  Endo/Other  Hypothyroidism    Renal/GU negative Renal ROS  negative genitourinary   Musculoskeletal   Abdominal   Peds  Hematology negative hematology ROS (+)   Anesthesia Other Findings Past Medical History: No date: Arrhythmia     Comment:  atrial fibrillation No date: CHF (congestive heart failure) (HCC) No date: Chronic heart failure with preserved ejection fraction  (HFpEF) ([place])     Comment:  a. 01/2022 Echo: EF 65-70%, no rwma, mild LVH, GrII DD,               nl RV fxn, mildly dil LA, mild MR, ao scelrosis. No date: Fall No date: GERD (gastroesophageal reflux disease) No date: History of chicken pox No date: Hx of colonic polyps No date: Hyperlipidemia No date: Hypertension No date: Hypothyroidism 2018: Keratopathy     Comment:  R eye No date: LVH (left ventricular hypertrophy) No date: Persistent atrial fibrillation (HCC)     Comment:  a. Initially dx in 2012; b. 01/2022 recurrent AF rvr               noted-->warfarin added (CHA2DS2VASc = 5); c. 03/2022 Am[place]               added-->DCCV x 1 (150J).  Past Surgical History: 2011: CARDIOVASCULAR STRESS TEST     Comment:  normal  04/02/2022: CARDIOVERSION;  N/A     Comment:  Procedure: CARDIOVERSION;  Surgeon: Minna Merritts,               MD;  Location: [facility] ORS;  Service: Cardiovascular;                Laterality: N/A; 05/06/2022: CORONARY STENT INTERVENTION; N/A     Comment:  Procedure: CORONARY STENT INTERVENTION;  Surgeon: Wellington Hampshire, MD;  Location: [facility];                Service: Cardiovascular;  Laterality: N/A; No date: keratopathy 05/06/2022: LEFT HEART CATH AND CORONARY ANGIOGRAPHY; N/A     Comment:  Procedure: LEFT HEART CATH AND CORONARY ANGIOGRAPHY;                Surgeon: Wellington Hampshire, MD;  Location: [facility]               CV LAB;  Service: Cardiovascular;  Laterality: N/A;  BMI    Body Mass Index: 43.55 kg/m      Reproductive/Obstetrics negative OB ROS                             Anesthesia Physical Anesthesia Plan  ASA: 3  Anesthesia Plan: General   Post-op Pain Management:    Induction: Intravenous  PONV Risk Score and Plan: Propofol infusion and TIVA  Airway Management Planned: Natural Airway and Nasal Cannula  Additional Equipment:   Intra-op Plan:   Post-operative Plan:   Informed Consent: I have reviewed the patients History and Physical, chart, labs and discussed the procedure including the risks, benefits and alternatives for the proposed anesthesia with the patient or authorized representative who has indicated his/her understanding and acceptance.   Patient has DNR.  Discussed DNR with patient and Suspend DNR.   Dental Advisory Given  Plan Discussed with: Anesthesiologist, CRNA and Surgeon  Anesthesia Plan Comments: (Patient consented for risks of anesthesia including but not limited to:  - adverse reactions to medications - risk of airway placement if required - damage to eyes, teeth, lips or other oral mucosa - nerve damage due to positioning  - sore throat or hoarseness - Damage to heart, brain, nerves, lungs, other  parts of body or loss of life  Patient voiced understanding.)       Anesthesia Quick Evaluation

## 2022-11-14 NOTE — Transfer of Care (Signed)
Immediate Anesthesia Transfer of Care Note  Patient: Julie Phillips  Procedure(s) Performed: ESOPHAGOGASTRODUODENOSCOPY (EGD) WITH PROPOFOL  Patient Location: PACU  Anesthesia Type:General  Level of Consciousness: awake  Airway & Oxygen Therapy: Patient Spontanous Breathing  Post-op Assessment: Report given to RN  Post vital signs: stable  Last Vitals:  Vitals Value Taken Time  BP 104/57   Temp 97.19f  Pulse 62 11/14/22 0831  Resp 15 11/14/22 0831  SpO2 95 % 11/14/22 0831  Vitals shown include unvalidated device data.  Last Pain:  Vitals:   11/14/22 0805  TempSrc:   PainSc: 0-No pain         Complications: No notable events documented.

## 2022-11-14 NOTE — Care Plan (Signed)
EGD unremarkable. Planning for colonoscopy Tuesday to let her plavix and coumadin wash out in case any polyps encountered.  Raylene Miyamoto MD, MPH [place]

## 2022-11-14 NOTE — Op Note (Signed)
[facility] Patient Name: Julie Phillips Procedure Date: 11/14/2022 7:44 AM MRN: [id number] Account #: [id number] Date of Birth: November 21, 1932 Admit Type: Inpatient Age: 88 Room: [facility] ENDO ROOM 4 Gender: Female Note Status: Finalized Instrument Name: Upper Endoscope [id number] Procedure:             Upper GI endoscopy Indications:           Melena Providers:             Andrey Farmer MD, MD Referring MD:          Elveria Rising. Damita Dunnings, MD (Referring MD) Medicines:             Monitored Anesthesia Care Complications:         No immediate complications. Procedure:             Pre-Anesthesia Assessment:                        - Prior to the procedure, a History and Physical was                         performed, and patient medications and allergies were                         reviewed. The patient is competent. The risks and                         benefits of the procedure and the sedation options and                         risks were discussed with the patient. All questions                         were answered and informed consent was obtained.                         Patient identification and proposed procedure were                         verified by the physician, the nurse, the                         anesthesiologist, the anesthetist and the technician                         in the endoscopy suite. Mental Status Examination:                         alert and oriented. Airway Examination: normal                         oropharyngeal airway and neck mobility. Respiratory                         Examination: clear to auscultation. CV Examination:                         normal. Prophylactic Antibiotics: The patient does not  require prophylactic antibiotics. Prior                         Anticoagulants: The patient has taken Coumadin                         (warfarin), last dose was 2 days prior to procedure.                          ASA Grade Assessment: III - A patient with severe                         systemic disease. After reviewing the risks and                         benefits, the patient was deemed in satisfactory                         condition to undergo the procedure. The anesthesia                         plan was to use monitored anesthesia care (MAC).                         Immediately prior to administration of medications,                         the patient was re-assessed for adequacy to receive                         sedatives. The heart rate, respiratory rate, oxygen                         saturations, blood pressure, adequacy of pulmonary                         ventilation, and response to care were monitored                         throughout the procedure. The physical status of the                         patient was re-assessed after the procedure.                        After obtaining informed consent, the endoscope was                         passed under direct vision. Throughout the procedure,                         the patient's blood pressure, pulse, and oxygen                         saturations were monitored continuously. The Endoscope                         was introduced through the mouth, and advanced to the  second part of duodenum. The upper GI endoscopy was                         accomplished without difficulty. The patient tolerated                         the procedure well. Findings:      A mild Schatzki ring was found in the lower third of the esophagus.      A small hiatal hernia was present.      The entire examined stomach was normal.      The examined duodenum was normal. Impression:            - Mild Schatzki ring.                        - Small hiatal hernia.                        - Normal stomach.                        - Normal examined duodenum.                        - No specimens collected. Recommendation:        -  Return patient to hospital ward for ongoing care.                        - Perform a colonoscopy in 2 days.                        - Resume regular diet.                        - Continue present medications. Procedure Code(s):     --- Professional ---                        [phone], Esophagogastroduodenoscopy, flexible,                         transoral; diagnostic, including collection of                         specimen(s) by brushing or washing, when performed                         (separate procedure) Diagnosis Code(s):     --- Professional ---                        K22.2, Esophageal obstruction                        K44.9, Diaphragmatic hernia without obstruction or                         gangrene                        K92.1, Melena (includes Hematochezia) CPT copyright 2022 American Medical Association. All rights reserved. The codes documented in this report are preliminary and upon coder review  may  be revised to meet current compliance requirements. Andrey Farmer MD, MD 11/14/2022 8:29:47 AM Number of Addenda: 0 Note Initiated On: 11/14/2022 7:44 AM Estimated Blood Loss:  Estimated blood loss: none.      [facility]

## 2022-11-14 NOTE — Hospital Course (Addendum)
88 y.o. female with medical history significant of CHF, GERD, hypertension, hypothyroidism, atrial fibrillation on warfarin, who presented with a chief complaint of dyspnea, fatigue, and feeling faint, found to have symptomatic anemia.  12/3: EGD unremarkable.  12/4: colonoscopy planned for tomorrow to let Plavix and Coumadin washout

## 2022-11-14 NOTE — Anesthesia Postprocedure Evaluation (Signed)
Anesthesia Post Note  Patient: Julie Phillips  Procedure(s) Performed: ESOPHAGOGASTRODUODENOSCOPY (EGD) WITH PROPOFOL  Patient location during evaluation: Endoscopy Anesthesia Type: General Level of consciousness: awake and alert Pain management: pain level controlled Vital Signs Assessment: post-procedure vital signs reviewed and stable Respiratory status: spontaneous breathing, nonlabored ventilation, respiratory function stable and patient connected to nasal cannula oxygen Cardiovascular status: blood pressure returned to baseline and stable Postop Assessment: no apparent nausea or vomiting Anesthetic complications: no   No notable events documented.   Last Vitals:  Vitals:   11/14/22 0845 11/14/22 0852  BP: (!) 108/41 (!) 125/47  Pulse: 65 65  Resp: 17 16  Temp:    SpO2: 97% 97%    Last Pain:  Vitals:   11/14/22 0852  TempSrc:   PainSc: 0-No pain                 Precious Haws Niki Payment

## 2022-11-15 DIAGNOSIS — I4811 Longstanding persistent atrial fibrillation: Secondary | ICD-10-CM | POA: Diagnosis not present

## 2022-11-15 DIAGNOSIS — D649 Anemia, unspecified: Secondary | ICD-10-CM | POA: Diagnosis not present

## 2022-11-15 DIAGNOSIS — E039 Hypothyroidism, unspecified: Secondary | ICD-10-CM | POA: Diagnosis not present

## 2022-11-15 DIAGNOSIS — I10 Essential (primary) hypertension: Secondary | ICD-10-CM | POA: Diagnosis not present

## 2022-11-15 LAB — BASIC METABOLIC PANEL
Anion gap: 4 — ABNORMAL LOW (ref 5–15)
BUN: 24 mg/dL — ABNORMAL HIGH (ref 8–23)
CO2: 25 mmol/L (ref 22–32)
Calcium: 8.9 mg/dL (ref 8.9–10.3)
Chloride: 109 mmol/L (ref 98–111)
Creatinine, Ser: 1.05 mg/dL — ABNORMAL HIGH (ref 0.44–1.00)
GFR, Estimated: 51 mL/min — ABNORMAL LOW (ref >60)
Glucose, Bld: 97 mg/dL (ref 70–99)
Potassium: 4.3 mmol/L (ref 3.5–5.1)
Sodium: 138 mmol/L (ref 135–145)

## 2022-11-15 LAB — CBC
HCT: 27.7 % — ABNORMAL LOW (ref 36.0–46.0)
Hemoglobin: 9 g/dL — ABNORMAL LOW (ref 12.0–15.0)
MCH: 28.8 pg (ref 26.0–34.0)
MCHC: 32.5 g/dL (ref 30.0–36.0)
MCV: 88.8 fL (ref 80.0–100.0)
Platelets: 265 10*3/uL (ref 150–400)
RBC: 3.12 MIL/uL — ABNORMAL LOW (ref 3.87–5.11)
RDW: 15.9 % — ABNORMAL HIGH (ref 11.5–15.5)
WBC: 9.7 10*3/uL (ref 4.0–10.5)
nRBC: 0 % (ref 0.0–0.2)

## 2022-11-15 LAB — MAGNESIUM: Magnesium: 2.3 mg/dL (ref 1.7–2.4)

## 2022-11-15 LAB — PROTIME-INR
INR: 1.9 — ABNORMAL HIGH (ref 0.8–1.2)
Prothrombin Time: 21.6 seconds — ABNORMAL HIGH (ref 11.4–15.2)

## 2022-11-15 MED ORDER — PEG 3350-KCL-NA BICARB-NACL 420 G PO SOLR
4000.0000 mL | Freq: Once | ORAL | Status: AC
  Administered 2022-11-15: 4000 mL via ORAL
  Filled 2022-11-15: qty 4000

## 2022-11-15 NOTE — Care Plan (Signed)
Plan for colonoscopy tomorrow. Prep orders entered. 1/2 tonight, 1/2 tomorrow with goal of finishing by 8 am.  Raylene Miyamoto MD, MPH [place]

## 2022-11-15 NOTE — Assessment & Plan Note (Signed)
-   Continue amiodarone - Continue metoprolol - Hold warfarin in the setting of GI bleed - Hold diltiazem in the setting of soft pressures

## 2022-11-15 NOTE — Assessment & Plan Note (Signed)
-  Continue losartan and metoprolol 

## 2022-11-15 NOTE — Progress Notes (Signed)
  Progress Note   Patient: Julie Phillips EHM:[id number] DOB: Aug 28, 1932 DOA: 11/12/2022     3 DOS: the patient was seen and examined on 11/15/2022   Brief hospital course: 88 y.o. female with medical history significant of CHF, GERD, hypertension, hypothyroidism, atrial fibrillation on warfarin, who presented with a chief complaint of dyspnea, fatigue, and feeling faint, found to have symptomatic anemia.  12/3: EGD unremarkable.  12/4: colonoscopy planned for tomorrow to let Plavix and Coumadin washout  Assessment and Plan: * Symptomatic anemia - Patient has dyspnea even just from talking - Hemoglobin 11.2>> 7.6>9  Given report of melena, concern for upper GI bleed. - 1 unit packed red blood cells ordered at admission -Status post EGD on 12/3 -unremarkable, mild Schatzki's ring -- Continue to hold Plavix, warfarin.  Colonoscopy planned for tomorrow let Plavix and warfarin washout --monitor Hgb and transfuse to keep Hgb >8 -Continue Protonix  Essential hypertension - Continue losartan and metoprolol.  Hypothyroid - Continue Synthroid.  GI bleed - Melena for 1 week.  May have upper GI bleed which is exacerbated by supratherapeutic INR (4.0) and plavix - Hemoglobin 11.2>> 7.6> 9 - Switch Protonix drip to oral Protonix twice a day -INR 1.9 today --hold home warfarin and plavix till colonoscopy tomorrow -Stable H&H for now  Coronary artery disease involving native coronary artery of native heart with angina pectoris (HCC) - Hold Plavix in the setting of GI bleed.  Atrial fibrillation (HCC) - Continue amiodarone - Continue metoprolol - Hold warfarin in the setting of GI bleed - Hold diltiazem in the setting of soft pressures         Subjective: Sitting in the chair.  No new issues.  Hemoglobin stable.  No further bleed/bowel movement  Physical Exam: Vitals:   11/14/22 0852 11/14/22 0959 11/14/22 1656 11/15/22 0158  BP: (!) 125/47 (!) 119/34 105/61 (!) 101/54  Pulse: 65  61 (!) 59 60  Resp: '16 18 17 16  '$ Temp:  98 F (36.7 C) 98.1 F (36.7 C) 98.4 F (36.9 C)  TempSrc:    Oral  SpO2: 97% 94% 96% 96%  Weight:      Height:       88 year old female lying in the bed comfortably without any acute distress Lungs clear to auscultation bilaterally Cardiovascular irregularly irregular heart rate and rhythm Abdomen soft, benign Neuro alert and awake, nonfocal Skin no rash or lesion Data Reviewed:  Hemoglobin 9.0, INR 1.9  Family Communication: None  Disposition: Status is: Inpatient Remains inpatient appropriate because: Colonoscopy tomorrow  Planned Discharge Destination: Home   DVT prophylaxis-SCDs Time spent: 35 minutes  Author: Max Sane, MD 11/15/2022 3:30 PM  For on call review [email or web address].

## 2022-11-15 NOTE — Progress Notes (Signed)
Spoke with the patient,  pt has a 3ww, two rollators, 2 canes, and 1 walking stick placed around home so she always has something around to use); lives alone in 1 level home with level entry.     Her daughter will be able to assist in the evening and night after work

## 2022-11-15 NOTE — Assessment & Plan Note (Signed)
-   Hold Plavix in the setting of GI bleed.

## 2022-11-15 NOTE — Progress Notes (Signed)
Mobility Specialist - Progress Note    11/15/22 1151  Mobility  Activity Ambulated with assistance in hallway;Stood at bedside  Level of Assistance Modified independent, requires aide device or extra time  Assistive Device Front wheel walker  Distance Ambulated (ft) 160 ft  Activity Response Tolerated well  Mobility Referral Yes  $Mobility charge 1 Mobility   Pt resting in recliner on RA upon entry. PT STS and ambulates ModI around NS in hallway. Pt expressed having SOB but very motivated to continue mobility. Pt took x4 standing rest breaks and declined to sit each time author offered. Pt returned to recliner and said she felt a lot better after walking. Pt left with needs in reach and chair alarm activated. RN Notified.   Loma Sender Mobility Specialist 11/15/22, 11:55 AM

## 2022-11-15 NOTE — Assessment & Plan Note (Signed)
-   Patient has dyspnea even just from talking - Hemoglobin 11.2>> 7.6>9  Given report of melena, concern for upper GI bleed. - 1 unit packed red blood cells ordered at admission -Status post EGD on 12/3 -unremarkable, mild Schatzki's ring -- Continue to hold Plavix, warfarin.  Colonoscopy planned for tomorrow let Plavix and warfarin washout --monitor Hgb and transfuse to keep Hgb >8 -Continue Protonix

## 2022-11-15 NOTE — Progress Notes (Signed)
Met with the patient and discussed DC plan and needs She lives at home alone, she stated that she has a lot of help at home Her neighbor will provide transportation home, she still drives She has DME at home and does not want additional She would like to have [facility] PT I reached out to lisa at [facility] and provided the referral

## 2022-11-15 NOTE — Evaluation (Signed)
Occupational Therapy Evaluation Patient Details Name: Julie Phillips MRN: [id number] DOB: 1932/08/16 Today's Date: 11/15/2022   History of Present Illness Pt is an 88 y.o. female presenting to hospital 11/12/22 with c/o episodes of SOB (came from MD office); increased fatigue past week and noticed black stools.  Pt admitted with GI bleed and symptomatic anemia.  S/p upper GI endoscopy 12/3 (unremarkable).  PMH includes CHF, persistent a-fib, htn, HLD, hypothyroidism, hearing loss, obesity.   Clinical Impression   Patient presenting with decreased independence in self care, balance, functional mobility/transfers, and endurance. Prior to admission, pt was independent with ADLs/IADLs and Mod I for functional mobility using an AD. Pt currently functioning at Mod I for bed mobility, Min guard for functional mobility to the bathroom using RW, supervision for toilet transfer, and set up-supervision for sinkside grooming tasks. Pt will benefit from acute OT to increase overall independence in the areas of ADLs and functional mobility in order to safely discharge home. Pt could benefit from [facility] following D/C to decrease falls risk, improve balance, and maximize independence in self-care within own home environment.     Recommendations for follow up therapy are one component of a multi-disciplinary discharge planning process, led by the attending physician.  Recommendations may be updated based on patient status, additional functional criteria and insurance authorization.   Follow Up Recommendations  Home health OT     Assistance Recommended at Discharge PRN  Patient can return home with the following A little help with walking and/or transfers;A little help with bathing/dressing/bathroom;Assistance with cooking/housework;Assist for transportation;Help with stairs or ramp for entrance    Functional Status Assessment  Patient has had a recent decline in their functional status and demonstrates the  ability to make significant improvements in function in a reasonable and predictable amount of time.  Equipment Recommendations  None recommended by OT    Recommendations for Other Services       Precautions / Restrictions Precautions Precautions: Fall Restrictions Weight Bearing Restrictions: No      Mobility Bed Mobility Overal bed mobility: Modified Independent                  Transfers Overall transfer level: Needs assistance Equipment used: Rolling walker (2 wheels) Transfers: Sit to/from Stand Sit to Stand: Supervision           General transfer comment: STS from EOB and regular height toilet      Balance Overall balance assessment: Needs assistance Sitting-balance support: No upper extremity supported, Feet supported Sitting balance-Leahy Scale: Normal     Standing balance support: Bilateral upper extremity supported, During functional activity, Single extremity supported Standing balance-Leahy Scale: Good Standing balance comment: use of RW for mobility, use of sink for UE support during grooming tasks                           ADL either performed or assessed with clinical judgement   ADL Overall ADL's : Needs assistance/impaired     Grooming: Standing;Set up;Wash/dry face;Wash/dry Teacher, music: Ambulation;Regular Toilet;Grab bars;Rolling walker (2 wheels);Supervision/safety   Toileting- Water quality scientist and Hygiene: Supervision/safety;Sit to/from stand Toileting - Clothing Manipulation Details (indicate cue type and reason): for peri care     Functional mobility during ADLs: Min guard;Rolling walker (2 wheels) (to the bathroom)       Vision Baseline Vision/History: 1 Wears  glasses (for driving) Patient Visual Report: No change from baseline       Perception     Praxis      Pertinent Vitals/Pain Pain Assessment Pain Assessment: No/denies pain     Hand Dominance      Extremity/Trunk Assessment Upper Extremity Assessment Upper Extremity Assessment: Generalized weakness   Lower Extremity Assessment Lower Extremity Assessment: Generalized weakness   Cervical / Trunk Assessment Cervical / Trunk Assessment: Other exceptions Cervical / Trunk Exceptions: forward head/shoulders   Communication Communication Communication: No difficulties   Cognition Arousal/Alertness: Awake/alert Behavior During Therapy: WFL for tasks assessed/performed Overall Cognitive Status: Within Functional Limits for tasks assessed                                       General Comments       Exercises Other Exercises Other Exercises: OT provided education re: role of OT, OT POC, post acute recs, sitting up for all meals, EOB/OOB mobility with assistance, home/fall safety.     Shoulder Instructions      Home Living Family/patient expects to be discharged to:: Private residence Living Arrangements: Alone Available Help at Discharge: Family (pt's daughter able to assist at night (after work)) Type of Home: House (1 bedroom bungalow) Home Access: Level entry     [place]: One level     Bathroom Shower/Tub: Teacher, early years/pre: Handicapped height     Home Equipment: [place] (4 wheels);Cane - single point;Grab bars - tub/shower (2 rollators; 2 canes; 3ww; walking stick)          Prior Functioning/Environment Prior Level of Function : Independent/Modified Independent;Driving             Mobility Comments: denies history of falls, still driving ADLs Comments: Independent        OT Problem List: Decreased strength;Decreased activity tolerance;Impaired balance (sitting and/or standing)      OT Treatment/Interventions: Self-care/ADL training;Therapeutic exercise;Therapeutic activities;Energy conservation;DME and/or AE instruction;Patient/family education;Balance training    OT Goals(Current goals can be found in the care  plan section) Acute Rehab OT Goals Patient Stated Goal: to go home OT Goal Formulation: With patient Time For Goal Achievement: 11/29/22 Potential to Achieve Goals: Good   OT Frequency: Min 2X/week    Co-evaluation              AM-PAC OT "6 Clicks" Daily Activity     Outcome Measure Help from another person eating meals?: None Help from another person taking care of personal grooming?: A Little Help from another person toileting, which includes using toliet, bedpan, or urinal?: A Little Help from another person bathing (including washing, rinsing, drying)?: A Little Help from another person to put on and taking off regular upper body clothing?: None Help from another person to put on and taking off regular lower body clothing?: A Little 6 Click Score: 20   End of Session Equipment Utilized During Treatment: Rolling walker (2 wheels);Gait belt Nurse Communication: Mobility status  Activity Tolerance: Patient tolerated treatment well Patient left: in chair;with call bell/phone within reach;with chair alarm set  OT Visit Diagnosis: Muscle weakness (generalized) (M62.81);Other abnormalities of gait and mobility (R26.89)                Time: 5465-0354 OT Time Calculation (min): 20 min Charges:  OT General Charges $OT Visit: 1 Visit OT Evaluation $OT Eval Low Complexity: 1 Low  Lanelle Bal  [place] MS, OTR/L ascom [phone]  11/15/22, 1:39 PM

## 2022-11-15 NOTE — Assessment & Plan Note (Signed)
Continue Synthroid °

## 2022-11-15 NOTE — Assessment & Plan Note (Signed)
-   Melena for 1 week.  May have upper GI bleed which is exacerbated by supratherapeutic INR (4.0) and plavix - Hemoglobin 11.2>> 7.6> 9 - Switch Protonix drip to oral Protonix twice a day -INR 1.9 today --hold home warfarin and plavix till colonoscopy tomorrow -Stable H&H for now

## 2022-11-16 ENCOUNTER — Inpatient Hospital Stay: Payer: PPO | Admitting: Anesthesiology

## 2022-11-16 ENCOUNTER — Encounter
Admission: EM | Disposition: A | Discharge status: Home Health | Payer: Self-pay | Source: Ambulatory Visit | Attending: Internal Medicine

## 2022-11-16 ENCOUNTER — Ambulatory Visit: Payer: PPO | Admitting: Clinical

## 2022-11-16 ENCOUNTER — Encounter: Payer: Self-pay | Admitting: Family Medicine

## 2022-11-16 DIAGNOSIS — D649 Anemia, unspecified: Secondary | ICD-10-CM | POA: Diagnosis not present

## 2022-11-16 DIAGNOSIS — I25119 Atherosclerotic heart disease of native coronary artery with unspecified angina pectoris: Secondary | ICD-10-CM | POA: Diagnosis not present

## 2022-11-16 HISTORY — PX: COLONOSCOPY WITH PROPOFOL: SHX5780

## 2022-11-16 LAB — TYPE AND SCREEN
ABO/RH(D): O POS
Antibody Screen: NEGATIVE
Unit division: 0
Unit division: 0
Unit division: 0

## 2022-11-16 LAB — BASIC METABOLIC PANEL
Anion gap: 7 (ref 5–15)
BUN: 16 mg/dL (ref 8–23)
CO2: 23 mmol/L (ref 22–32)
Calcium: 8.6 mg/dL — ABNORMAL LOW (ref 8.9–10.3)
Chloride: 109 mmol/L (ref 98–111)
Creatinine, Ser: 0.92 mg/dL (ref 0.44–1.00)
GFR, Estimated: 60 mL/min — ABNORMAL LOW (ref >60)
Glucose, Bld: 81 mg/dL (ref 70–99)
Potassium: 4.2 mmol/L (ref 3.5–5.1)
Sodium: 139 mmol/L (ref 135–145)

## 2022-11-16 LAB — BPAM RBC
Blood Product Expiration Date: 202312282359
Blood Product Expiration Date: 202401012359
Blood Product Expiration Date: 202401012359
ISSUE DATE / TIME: 202312020146
Unit Type and Rh: 5100
Unit Type and Rh: 5100
Unit Type and Rh: 5100

## 2022-11-16 LAB — CBC
HCT: 26.5 % — ABNORMAL LOW (ref 36.0–46.0)
Hemoglobin: 8.2 g/dL — ABNORMAL LOW (ref 12.0–15.0)
MCH: 27.8 pg (ref 26.0–34.0)
MCHC: 30.9 g/dL (ref 30.0–36.0)
MCV: 89.8 fL (ref 80.0–100.0)
Platelets: 276 10*3/uL (ref 150–400)
RBC: 2.95 MIL/uL — ABNORMAL LOW (ref 3.87–5.11)
RDW: 15.8 % — ABNORMAL HIGH (ref 11.5–15.5)
WBC: 9.1 10*3/uL (ref 4.0–10.5)
nRBC: 0 % (ref 0.0–0.2)

## 2022-11-16 LAB — MAGNESIUM: Magnesium: 2.1 mg/dL (ref 1.7–2.4)

## 2022-11-16 LAB — PROTIME-INR
INR: 1.6 — ABNORMAL HIGH (ref 0.8–1.2)
Prothrombin Time: 19 seconds — ABNORMAL HIGH (ref 11.4–15.2)

## 2022-11-16 SURGERY — COLONOSCOPY WITH PROPOFOL
Anesthesia: General

## 2022-11-16 MED ORDER — PROPOFOL 500 MG/50ML IV EMUL
INTRAVENOUS | Status: DC | PRN
  Administered 2022-11-16: 150 ug/kg/min via INTRAVENOUS

## 2022-11-16 MED ORDER — PANTOPRAZOLE SODIUM 40 MG PO TBEC: 40.0000 mg | DELAYED_RELEASE_TABLET | Freq: Two times a day (BID) | ORAL | 0 refills | Status: DC

## 2022-11-16 MED ORDER — EPHEDRINE 5 MG/ML INJ
INTRAVENOUS | Status: AC
  Filled 2022-11-16: qty 5

## 2022-11-16 MED ORDER — LIDOCAINE HCL (CARDIAC) PF 100 MG/5ML IV SOSY
PREFILLED_SYRINGE | INTRAVENOUS | Status: DC | PRN
  Administered 2022-11-16: 50 mg via INTRAVENOUS

## 2022-11-16 MED ORDER — PROPOFOL 10 MG/ML IV BOLUS
INTRAVENOUS | Status: DC | PRN
  Administered 2022-11-16: 50 mg via INTRAVENOUS
  Administered 2022-11-16: 20 mg via INTRAVENOUS

## 2022-11-16 MED ORDER — SODIUM CHLORIDE 0.9 % IV SOLN: INTRAVENOUS | Status: DC

## 2022-11-16 NOTE — Op Note (Signed)
[facility] Gastroenterology Patient Name: Julie Phillips Procedure Date: 11/16/2022 2:43 PM MRN: [id number] Account #: [id number] Date of Birth: 1932/06/19 Admit Type: Inpatient Age: 88 Room: [facility] ENDO ROOM 1 Gender: Female Note Status: Finalized Instrument Name: Jasper Riling [id number] Procedure:             Colonoscopy Indications:           Melena Providers:             Andrey Farmer MD, MD Referring MD:          Elveria Rising. Damita Dunnings, MD (Referring MD) Medicines:             Monitored Anesthesia Care Complications:         No immediate complications. Procedure:             Pre-Anesthesia Assessment:                        - Prior to the procedure, a History and Physical was                         performed, and patient medications and allergies were                         reviewed. The patient is competent. The risks and                         benefits of the procedure and the sedation options and                         risks were discussed with the patient. All questions                         were answered and informed consent was obtained.                         Patient identification and proposed procedure were                         verified by the physician, the nurse, the                         anesthesiologist, the anesthetist and the technician                         in the endoscopy suite. Mental Status Examination:                         alert and oriented. Airway Examination: normal                         oropharyngeal airway and neck mobility. Respiratory                         Examination: clear to auscultation. CV Examination:                         normal. Prophylactic Antibiotics: The patient does not  require prophylactic antibiotics. Prior                         Anticoagulants: The patient has taken Plavix                         (clopidogrel), last dose was 5 days prior to                         procedure.  ASA Grade Assessment: III - A patient with                         severe systemic disease. After reviewing the risks and                         benefits, the patient was deemed in satisfactory                         condition to undergo the procedure. The anesthesia                         plan was to use monitored anesthesia care (MAC).                         Immediately prior to administration of medications,                         the patient was re-assessed for adequacy to receive                         sedatives. The heart rate, respiratory rate, oxygen                         saturations, blood pressure, adequacy of pulmonary                         ventilation, and response to care were monitored                         throughout the procedure. The physical status of the                         patient was re-assessed after the procedure.                        After obtaining informed consent, the colonoscope was                         passed under direct vision. Throughout the procedure,                         the patient's blood pressure, pulse, and oxygen                         saturations were monitored continuously. The                         Colonoscope was introduced through the anus and  advanced to the the terminal ileum. The colonoscopy                         was somewhat difficult due to significant looping.                         Successful completion of the procedure was aided by                         applying abdominal pressure. The patient tolerated the                         procedure well. The quality of the bowel preparation                         was fair. The terminal ileum, ileocecal valve,                         appendiceal orifice, and rectum were photographed. Findings:      The perianal and digital rectal examinations were normal.      The terminal ileum appeared normal.      Multiple small-mouthed diverticula were  found in the sigmoid colon,       descending colon and ascending colon.      Internal hemorrhoids were found during retroflexion. The hemorrhoids       were Grade I (internal hemorrhoids that do not prolapse).      The exam was otherwise without abnormality on direct and retroflexion       views. Impression:            - Preparation of the colon was fair.                        - The examined portion of the ileum was normal.                        - Diverticulosis in the sigmoid colon, in the                         descending colon and in the ascending colon.                        - Internal hemorrhoids.                        - The examination was otherwise normal on direct and                         retroflexion views.                        - No specimens collected. Recommendation:        - Return patient to hospital ward for ongoing care.                        - Advance diet as tolerated.                        - Continue present medications.                        -  Given negative EGD/Colonoscopy, likely small bowel                         avm's. Will need to discuss risks/benefits of                         restarting anticoagulation. Could touch base with                         cardiology to discuss potentially discontinuing plavix                         and continueing coumadin. Would need outpatient f/u to                         discuss video capsule study. Procedure Code(s):     --- Professional ---                        [id number], Colonoscopy, flexible; diagnostic, including                         collection of specimen(s) by brushing or washing, when                         performed (separate procedure) Diagnosis Code(s):     --- Professional ---                        K64.0, First degree hemorrhoids                        K92.1, Melena (includes Hematochezia)                        K57.30, Diverticulosis of large intestine without                         perforation  or abscess without bleeding CPT copyright 2022 American Medical Association. All rights reserved. The codes documented in this report are preliminary and upon coder review may  be revised to meet current compliance requirements. Andrey Farmer MD, MD 11/16/2022 3:21:27 PM Number of Addenda: 0 Note Initiated On: 11/16/2022 2:43 PM Scope Withdrawal Time: 0 hours 12 minutes 51 seconds  Total Procedure Duration: 0 hours 20 minutes 4 seconds  Estimated Blood Loss:  Estimated blood loss: none.      [facility]

## 2022-11-16 NOTE — Plan of Care (Signed)
Patient refusing bed alarm. Patient educated, grip socks on, call bell within reach.   Problem: Education: Goal: Knowledge of General Education information will improve Description: Including pain rating scale, medication(s)/side effects and non-pharmacologic comfort measures Outcome: Progressing   Problem: Health Behavior/Discharge Planning: Goal: Ability to manage health-related needs will improve Outcome: Progressing   Problem: Clinical Measurements: Goal: Ability to maintain clinical measurements within normal limits will improve Outcome: Progressing Goal: Will remain free from infection Outcome: Progressing Goal: Diagnostic test results will improve Outcome: Progressing Goal: Respiratory complications will improve Outcome: Progressing Goal: Cardiovascular complication will be avoided Outcome: Progressing   Problem: Activity: Goal: Risk for activity intolerance will decrease Outcome: Progressing   Problem: Nutrition: Goal: Adequate nutrition will be maintained Outcome: Progressing   Problem: Coping: Goal: Level of anxiety will decrease Outcome: Progressing   Problem: Elimination: Goal: Will not experience complications related to bowel motility Outcome: Progressing Goal: Will not experience complications related to urinary retention Outcome: Progressing   Problem: Pain Managment: Goal: General experience of comfort will improve Outcome: Progressing   Problem: Safety: Goal: Ability to remain free from injury will improve Outcome: Progressing   Problem: Skin Integrity: Goal: Risk for impaired skin integrity will decrease Outcome: Progressing

## 2022-11-16 NOTE — Progress Notes (Signed)
PT Cancellation Note  Patient Details Name: Julie Phillips MRN: [id number] DOB: [birth year]   Cancelled Treatment:    Reason Eval/Treat Not Completed: Patient at procedure or test/unavailable (Pt OTF for procedure. Will resume treatment at later date/time.)  2:29 PM, 11/16/22 Etta Grandchild, PT, DPT Physical Therapist - [facility]  [phone] ([place])    [place] C 11/16/2022, 2:29 PM

## 2022-11-16 NOTE — Care Management Important Message (Signed)
Important Message  Patient Details  Name: Julie Phillips MRN: [id number] Date of Birth: 06-Mar-1932   Medicare Important Message Given:  Other (see comment)  Patient out of the room for a procedure.   [name] [name] 11/16/2022, 2:54 PM

## 2022-11-16 NOTE — Anesthesia Procedure Notes (Signed)
Date/Time: 11/16/2022 2:50 PM  Performed by: Johnna Acosta, CRNAPre-anesthesia Checklist: Patient identified, Emergency Drugs available, Suction available, Patient being monitored and Timeout performed Patient Re-evaluated:Patient Re-evaluated prior to induction Oxygen Delivery Method: Nasal cannula Preoxygenation: Pre-oxygenation with 100% oxygen Induction Type: IV induction

## 2022-11-16 NOTE — Transfer of Care (Signed)
Immediate Anesthesia Transfer of Care Note  Patient: Julie Phillips  Procedure(s) Performed: COLONOSCOPY WITH PROPOFOL  Patient Location: PACU  Anesthesia Type:General  Level of Consciousness: drowsy  Airway & Oxygen Therapy: Patient Spontanous Breathing  Post-op Assessment: Report given to RN and Post -op Vital signs reviewed and stable  Post vital signs: Reviewed and stable  Last Vitals:  Vitals Value Taken Time  BP 132/56 11/16/22 1524  Temp 35.8 C 11/16/22 1520  Pulse 78 11/16/22 1525  Resp 14 11/16/22 1525  SpO2 96 % 11/16/22 1525  Vitals shown include unvalidated device data.  Last Pain:  Vitals:   11/16/22 1520  TempSrc: Temporal  PainSc: Asleep         Complications: No notable events documented.

## 2022-11-16 NOTE — Anesthesia Preprocedure Evaluation (Signed)
Anesthesia Evaluation  Patient identified by MRN, date of birth, ID band Patient awake    Reviewed: Allergy & Precautions, NPO status , Patient's Chart, lab work & pertinent test results  History of Anesthesia Complications Negative for: history of anesthetic complications  Airway Mallampati: III  TM Distance: <3 FB Neck ROM: full    Dental  (+) Chipped, Poor Dentition, Missing   Pulmonary shortness of breath, former smoker   Pulmonary exam normal        Cardiovascular Exercise Tolerance: Good hypertension, (-) angina + CAD and +CHF  Normal cardiovascular exam     Neuro/Psych  Neuromuscular disease  negative psych ROS   GI/Hepatic Neg liver ROS,GERD  Controlled,,  Endo/Other  Hypothyroidism    Renal/GU negative Renal ROS  negative genitourinary   Musculoskeletal   Abdominal   Peds  Hematology  (+) Blood dyscrasia, anemia   Anesthesia Other Findings Past Medical History: No date: Arrhythmia     Comment:  atrial fibrillation No date: CHF (congestive heart failure) (HCC) No date: Chronic heart failure with preserved ejection fraction  (HFpEF) ([place])     Comment:  a. 01/2022 Echo: EF 65-70%, no rwma, mild LVH, GrII DD,               nl RV fxn, mildly dil LA, mild MR, ao scelrosis. No date: Fall No date: GERD (gastroesophageal reflux disease) No date: History of chicken pox No date: Hx of colonic polyps No date: Hyperlipidemia No date: Hypertension No date: Hypothyroidism 2018: Keratopathy     Comment:  R eye No date: LVH (left ventricular hypertrophy) No date: Persistent atrial fibrillation (HCC)     Comment:  a. Initially dx in 2012; b. 01/2022 recurrent AF rvr               noted-->warfarin added (CHA2DS2VASc = 5); c. 03/2022 Amio               added-->DCCV x 1 (150J).  Past Surgical History: 2011: CARDIOVASCULAR STRESS TEST     Comment:  normal  04/02/2022: CARDIOVERSION; N/A     Comment:  Procedure:  CARDIOVERSION;  Surgeon: Minna Merritts,               MD;  Location: [facility] ORS;  Service: Cardiovascular;                Laterality: N/A; 05/06/2022: CORONARY STENT INTERVENTION; N/A     Comment:  Procedure: CORONARY STENT INTERVENTION;  Surgeon: Wellington Hampshire, MD;  Location: [place] CV [facility];                Service: Cardiovascular;  Laterality: N/A; 11/14/2022: ESOPHAGOGASTRODUODENOSCOPY (EGD) WITH PROPOFOL; N/A     Comment:  Procedure: ESOPHAGOGASTRODUODENOSCOPY (EGD) WITH               PROPOFOL;  Surgeon: Lesly Rubenstein, MD;  Location:               [facility] ENDOSCOPY;  Service: Endoscopy;  Laterality: N/A; No date: keratopathy 05/06/2022: LEFT HEART CATH AND CORONARY ANGIOGRAPHY; N/A     Comment:  Procedure: LEFT HEART CATH AND CORONARY ANGIOGRAPHY;                Surgeon: Wellington Hampshire, MD;  Location: [place]               CV LAB;  Service: Cardiovascular;  Laterality: N/A;  BMI    Body Mass Index: 43.55 kg/m      Reproductive/Obstetrics negative OB ROS                             Anesthesia Physical Anesthesia Plan  ASA: 3  Anesthesia Plan: General   Post-op Pain Management:    Induction: Intravenous  PONV Risk Score and Plan: Propofol infusion and TIVA  Airway Management Planned: Natural Airway and Nasal Cannula  Additional Equipment:   Intra-op Plan:   Post-operative Plan:   Informed Consent: I have reviewed the patients History and Physical, chart, labs and discussed the procedure including the risks, benefits and alternatives for the proposed anesthesia with the patient or authorized representative who has indicated his/her understanding and acceptance.   Patient has DNR.  Discussed DNR with patient and Suspend DNR.   Dental Advisory Given  Plan Discussed with: Anesthesiologist, CRNA and Surgeon  Anesthesia Plan Comments: (Patient consented for risks of anesthesia including but not limited to:  -  adverse reactions to medications - risk of airway placement if required - damage to eyes, teeth, lips or other oral mucosa - nerve damage due to positioning  - sore throat or hoarseness - Damage to heart, brain, nerves, lungs, other parts of body or loss of life  Patient voiced understanding.)       Anesthesia Quick Evaluation

## 2022-11-16 NOTE — Progress Notes (Addendum)
95 ENDO nurse stated that it was ok for pt to try to finish as much of Bowel prep before 11am, then strict NPO. Nurse informed pt but pt states "I drank most of it and Im just not going to do that because I do not want to aspirate"   1314 Report given to Endoscopy nurse. All questions answered pt remained NPO.  1906 D/C AVS completed and reviewed with pt. All opportunities for questions answered and clarified. IV removed. Pt will be wheeled down to car at medical mall entrance via wheelchair.

## 2022-11-16 NOTE — Plan of Care (Signed)

## 2022-11-16 NOTE — Anesthesia Postprocedure Evaluation (Signed)
Anesthesia Post Note  Patient: Julie Phillips  Procedure(s) Performed: COLONOSCOPY WITH PROPOFOL  Patient location during evaluation: Endoscopy Anesthesia Type: General Level of consciousness: awake and alert Pain management: pain level controlled Vital Signs Assessment: post-procedure vital signs reviewed and stable Respiratory status: spontaneous breathing, nonlabored ventilation, respiratory function stable and patient connected to nasal cannula oxygen Cardiovascular status: blood pressure returned to baseline and stable Postop Assessment: no apparent nausea or vomiting Anesthetic complications: no   No notable events documented.   Last Vitals:  Vitals:   11/16/22 1520 11/16/22 1530  BP: (!) 90/40 (!) 122/49  Pulse: 64 72  Resp: 11 18  Temp: (!) 35.8 C   SpO2: 95% 96%    Last Pain:  Vitals:   11/16/22 1530  TempSrc:   PainSc: Asleep                 Precious Haws Eiley Mcginnity

## 2022-11-16 NOTE — Progress Notes (Signed)
OT Cancellation Note  Patient Details Name: ALLENA PIETILA MRN: [id number] DOB: [birth year]   Cancelled Treatment:    Reason Eval/Treat Not Completed: Patient at procedure or test/ unavailable. Attempting to see pt for tx session. Spoke with RN and pt is currently OTF for procedure at endoscopy. Will re-attempt as able.   Doneta Public 11/16/2022, 4:13 PM

## 2022-11-17 ENCOUNTER — Telehealth: Payer: Self-pay | Admitting: Cardiovascular Disease

## 2022-11-17 ENCOUNTER — Encounter: Payer: Self-pay | Admitting: Gastroenterology

## 2022-11-17 MED ORDER — AMIODARONE HCL 100 MG PO TABS: 100.0000 mg | ORAL_TABLET | Freq: Every day | ORAL | 3 refills | Status: DC

## 2022-11-17 NOTE — Telephone Encounter (Signed)
Per discharge summary  STOP Amiodarone Plavix Zetia Omeprazole   Change  Protonix to 40 mg twice a day  Pt calling to make sure medication changes are appropriate. However, I don't see why amiodarone and zetia was stopped.  Will forward to NP

## 2022-11-17 NOTE — Telephone Encounter (Signed)
Per Barbera Setters, stop zetia due to intolerance and decrease amiodarone to 100 mg daily. Pt made aware and verbalized understanding.

## 2022-11-17 NOTE — Telephone Encounter (Signed)
Received clarification from Dr. Manuella Ghazi. Per MD, pt discharge instructions are as followed.  Change: omeprazole to protonix 40 mg twice a day  Stop: Plavix  Continue: All home medication including: Amiodarone 200 mg daily Zetia 10 mg daily Coumadin  Follow up with Dr. Rockey Situ in 2 weeks and need INR check 2-3 days   Pt made aware and stated she was under the impression from last office visit on 12/1 that amiodarone was decrease to 100 mg daily. Nurse informed pt of NP instructions to continue 200 mg. However, pt would like to seek clarification as she stated she read amiodarone dose should not be greater than 100 mg for pts older than 75.  Will forward to NP for recommendations   Appointment scheduled for 11/25/22 with [facility] for  hospital follow up.  Message sent to coumadin department to schedule INR check

## 2022-11-17 NOTE — Discharge Summary (Signed)
Physician Discharge Summary   Patient: Julie Phillips MRN: [id number] DOB: July 13, 1932  Admit date:     11/12/2022  Discharge date: 11/16/2022  Discharge Physician: Max Sane   PCP: Tonia Ghent, MD   Recommendations at discharge:   Follow-up with outpatient providers as requested  Discharge Diagnoses: Principal Problem:   Symptomatic anemia Active Problems:   Essential hypertension   Hypothyroid   Atrial fibrillation (HCC)   Coronary artery disease involving native coronary artery of native heart with angina pectoris ([place])   GI bleed  Hospital Course: 88 y.o. female with medical history significant of CHF, GERD, hypertension, hypothyroidism, atrial fibrillation on warfarin, who presented with a chief complaint of dyspnea, fatigue, and feeling faint, found to have symptomatic anemia.  12/3: EGD unremarkable.  12/4: colonoscopy planned for tomorrow to let Plavix and Coumadin washout  Assessment and Plan: * Symptomatic anemia - Patient has dyspnea even just from talking - Hemoglobin 11.2>> 7.6>9  Given report of melena, concern for upper GI bleed. - 1 unit packed red blood cells ordered at admission -Status post EGD on 12/3 -unremarkable, mild Schatzki's ring -- Colonoscopy also did not show any signs of bleeding stigmata -Continue Protonix -Discussion with GI and cardiology decision was made to hold Plavix and continue Coumadin at discharge.  She will need INR check in 2 to 3 days postdischarge to adjust Coumadin dosage  Essential hypertension - Continue losartan and metoprolol.  Hypothyroid - Continue Synthroid.  GI bleed - Melena for 1 week.  May have upper GI bleed which is exacerbated by supratherapeutic INR (4.0) and plavix - Hemoglobin 11.2>> 7.6> 9 - No further GI bleed while in the hospital.  H&H has remained stable.  EGD and colonoscopy did not show any obvious signs of bleeding stigmata.  Outpatient GI follow-up for video capsule endoscopy  Coronary  artery disease involving native coronary artery of native heart with angina pectoris (HCC) - Hold Plavix in the setting of GI bleed.  Continue Coumadin  Atrial fibrillation (HCC) - Continue amiodarone - Continue metoprolol - Resume Coumadin, holding Plavix at discharge  - Patient needs to continue all her home medicines (including amiodarone and zetia) as is (if in case, there was any med rec issues at discharge) except Plavix. I've switched her Omeprazole to Protonix 40 bid. I've conveyed this to Isac Caddy (PCP office) so they're aware.       Consultants: GI Procedures performed: EGD and colonoscopy Disposition: Home health Diet recommendation:  Discharge Diet Orders (From admission, onward)     Start     Ordered   11/16/22 0000  Diet - low sodium heart healthy        11/16/22 1644           Carb modified diet DISCHARGE MEDICATION: Allergies as of 11/16/2022       Reactions   Statins    Myalgias   Vitamin K And Related Shortness Of Breath   Wheat    Throat swelling   Ezetimibe Other (See Comments)   Extreme fatigue   Jardiance [empagliflozin] Other (See Comments)   Dry mouth, "gasping for breath"   Amlodipine    hematuria   Eucalyptus Oil    Lisinopril    Unrecalled BP med- possibly lisinopril- intolerant, had cough on the medicine- changed to cozaar   Rice Nausea And Vomiting        Medication List     STOP taking these medications    amiodarone 200 MG tablet Commonly known  as: PACERONE   clopidogrel 75 MG tablet Commonly known as: PLAVIX   ezetimibe 10 MG tablet Commonly known as: ZETIA   omeprazole 20 MG capsule Commonly known as: PRILOSEC       TAKE these medications    diltiazem 120 MG 24 hr capsule Commonly known as: CARDIZEM CD Take 1 capsule (120 mg total) by mouth daily.   furosemide 20 MG tablet Commonly known as: LASIX TAKE 1 TABLET BY MOUTH 2 TIMES DAILY   gabapentin 100 MG capsule Commonly known as: NEURONTIN TAKE 1  CAPSULE BY MOUTH 3 TIMES DAILY ASNEEDED   levothyroxine 100 MCG tablet Commonly known as: SYNTHROID TAKE 1 TABLET BY MOUTH ONCE A DAY BEFOREBREAKFAST.   losartan 100 MG tablet Commonly known as: COZAAR Take 1 tablet (100 mg total) by mouth daily.   LOSARTAN POTASSIUM PO Apply 0.8 mg to eye in the morning, at noon, in the evening, and at bedtime.   metoprolol succinate 50 MG 24 hr tablet Commonly known as: TOPROL-XL Take 1 tablet (50 mg) by mouth once daily. Take with or immediately following a meal.   nitroGLYCERIN 0.4 MG SL tablet Commonly known as: NITROSTAT Place 1 tablet (0.4 mg total) under the tongue every 5 (five) minutes as needed for chest pain.   pantoprazole 40 MG tablet Commonly known as: Protonix Take 1 tablet (40 mg total) by mouth 2 (two) times daily before a meal. What changed:  medication strength how much to take when to take this   Polyethyl Glycol-Propyl Glycol 0.4-0.3 % Gel ophthalmic gel Commonly known as: SYSTANE Place 1 application into both eyes.   potassium chloride 10 MEQ tablet Commonly known as: KLOR-CON TAKE 1 TABLET BY MOUTH ONCE A DAY   prednisoLONE acetate 1 % ophthalmic suspension Commonly known as: PRED FORTE Place 1 drop into the right eye 2 (two) times daily.   traZODone 50 MG tablet Commonly known as: DESYREL Take 0.5-1 tablets (25-50 mg total) by mouth at bedtime as needed for sleep. Hold for patient request.   warfarin 5 MG tablet Commonly known as: COUMADIN Take as directed. If you are unsure how to take this medication, talk to your nurse or doctor. Original instructions: TAKE 1 TABLET (5 MG) BY MOUTH DAILY EXCEPT TAKE 1/2 TABLET (2.5 MG) ON MONDAYS AND THURSDAYS OR AS DIRECTED BY ANTICOAGULATION CLINIC        Follow-up Information     Tonia Ghent, MD. Schedule an appointment as soon as possible for a visit in 2 day(s).   Specialty: Family Medicine Contact information: [place]  [address] [phone]         Minna Merritts, MD. Schedule an appointment as soon as possible for a visit in 2 week(s).   Specialty: Cardiology Why: [facility] Discharge F/UP Contact information: [address] [place] [address] [phone]         Lesly Rubenstein, MD. Schedule an appointment as soon as possible for a visit in 3 week(s).   Specialty: Gastroenterology Why: [facility] Discharge F/UP Contact information: [place] [address] [phone]                Discharge Exam: Danley Danker Weights   11/12/22 1544  Weight: 64.15 kg   88 year old female lying in the bed comfortably without any acute distress Lungs clear to auscultation bilaterally Cardiovascular irregularly irregular heart rate and rhythm Abdomen soft, benign Neuro alert and awake, nonfocal Skin  no rash or lesion  Condition at discharge: good  The results of significant diagnostics from this hospitalization (including imaging, microbiology, ancillary and laboratory) are listed below for reference.   Imaging Studies: DG Chest 2 View  Result Date: 11/12/2022 CLINICAL DATA:  Shortness of breath EXAM: CHEST - 2 VIEW COMPARISON:  09/21/2022 FINDINGS: The heart size and mediastinal contours are within normal limits. Aortic atherosclerosis. No focal airspace consolidation, pleural effusion, or pneumothorax. The visualized skeletal structures are unremarkable. IMPRESSION: No active cardiopulmonary disease. Electronically Signed   By: Davina Poke D.O.   On: 11/12/2022 16:05    Microbiology: Results for orders placed or performed during the hospital encounter of 04/01/22  Resp Panel by RT-PCR (Flu A&B, Covid)     Status: None   Collection Time: 04/01/22 12:32 PM   Specimen: Nasopharyngeal(NP) swabs in vial transport medium  Result Value Ref Range Status   SARS Coronavirus 2 by RT PCR NEGATIVE NEGATIVE Final    Comment: (NOTE) SARS-CoV-2 target  nucleic acids are NOT DETECTED.  The SARS-CoV-2 RNA is generally detectable in upper respiratory specimens during the acute phase of infection. The lowest concentration of SARS-CoV-2 viral copies this assay can detect is 138 copies/mL. A negative result does not preclude SARS-Cov-2 infection and should not be used as the sole basis for treatment or other patient management decisions. A negative result may occur with  improper specimen collection/handling, submission of specimen other than nasopharyngeal swab, presence of viral mutation(s) within the areas targeted by this assay, and inadequate number of viral copies(<138 copies/mL). A negative result must be combined with clinical observations, patient history, and epidemiological information. The expected result is Negative.  Fact Sheet for Patients:  EntrepreneurPulse.com.au  Fact Sheet for Healthcare Providers:  IncredibleEmployment.be  This test is no t yet approved or cleared by the [place] FDA and  has been authorized for detection and/or diagnosis of SARS-CoV-2 by FDA under an Emergency Use Authorization (EUA). This EUA will remain  in effect (meaning this test can be used) for the duration of the COVID-19 declaration under Section 564(b)(1) of the Act, 21 U.S.C.section 360bbb-3(b)(1), unless the authorization is terminated  or revoked sooner.       Influenza A by PCR NEGATIVE NEGATIVE Final   Influenza B by PCR NEGATIVE NEGATIVE Final    Comment: (NOTE) The Xpert Xpress SARS-CoV-2/FLU/RSV plus assay is intended as an aid in the diagnosis of influenza from Nasopharyngeal swab specimens and should not be used as a sole basis for treatment. Nasal washings and aspirates are unacceptable for Xpert Xpress SARS-CoV-2/FLU/RSV testing.  Fact Sheet for Patients: EntrepreneurPulse.com.au  Fact Sheet for Healthcare  Providers: IncredibleEmployment.be  This test is not yet approved or cleared by the [place] FDA and has been authorized for detection and/or diagnosis of SARS-CoV-2 by FDA under an Emergency Use Authorization (EUA). This EUA will remain in effect (meaning this test can be used) for the duration of the COVID-19 declaration under Section 564(b)(1) of the Act, 21 U.S.C. section 360bbb-3(b)(1), unless the authorization is terminated or revoked.  Performed at [facility], [address] [place], [place] [address]     Labs: CBC: Recent Labs  Lab 11/12/22 1548 11/13/22 0556 11/14/22 0523 11/15/22 0432 11/16/22 0326  WBC 10.5 9.6 8.5 9.7 9.1  NEUTROABS  --  6.7  --   --   --   HGB 7.6* 8.3* 8.4* 9.0* 8.2*  HCT 25.2* 25.8* 25.8* 27.7* 26.5*  MCV 90.3 88.1 87.2 88.8 89.8  PLT 311 233 257 265 098   Basic Metabolic Panel: Recent Labs  Lab 11/12/22 1548 11/13/22 0556 11/14/22 0523 11/15/22 0432 11/16/22 0326  NA 138 139 139 138 139  K 4.3 4.0 3.9 4.3 4.2  CL 108 109 111 109 109  CO2 '24 24 25 25 23  '$ GLUCOSE 92 83 86 97 81  BUN 31* 31* 22 24* 16  CREATININE 1.22* 1.01* 0.95 1.05* 0.92  CALCIUM 9.1 8.5* 9.0 8.9 8.6*  MG  --  2.0 2.3 2.3 2.1   Liver Function Tests: Recent Labs  Lab 11/13/22 0556  AST 18  ALT 13  ALKPHOS 58  BILITOT 0.8  PROT 6.4*  ALBUMIN 3.2*   CBG: No results for input(s): "GLUCAP" in the last 168 hours.  Discharge time spent: greater than 30 minutes.  Signed: Max Sane, MD [facility] 11/17/2022

## 2022-11-17 NOTE — Telephone Encounter (Signed)
Patient called stating she was recently in the hospital and they discontinued 4 of her medication. She saw Gerrie Nordmann while she was in the hospital. She wants to know what is going on, she wants to make sure everyone being discontinued from these medications was the right thing.

## 2022-11-18 ENCOUNTER — Telehealth: Payer: Self-pay | Admitting: Family Medicine

## 2022-11-18 ENCOUNTER — Telehealth: Payer: Self-pay | Admitting: *Deleted

## 2022-11-18 ENCOUNTER — Encounter: Payer: Self-pay | Admitting: Primary Care

## 2022-11-18 ENCOUNTER — Encounter: Payer: Self-pay | Admitting: *Deleted

## 2022-11-18 NOTE — Patient Outreach (Signed)
  Care Coordination [facility] Note Transition Care Management Unsuccessful Follow-up Telephone Call  Date of discharge and from where:  [facility] [phone]  Attempts:  1st Attempt  Reason for unsuccessful TCM follow-up call:  Left voice message [place] [facility] Management [phone]

## 2022-11-18 NOTE — Telephone Encounter (Signed)
Coumadin managed by [facility] Nurse contacted Dr. Damita Dunnings office to make aware discharge instruction recommended INR check  2 to 3 days postdischarge.

## 2022-11-18 NOTE — Telephone Encounter (Signed)
We can do that at the [place].  Thanks.

## 2022-11-18 NOTE — Telephone Encounter (Signed)
INR will be checked tomorrow. Discussed with PCP.

## 2022-11-18 NOTE — Telephone Encounter (Signed)
Julie Phillips called from [facility] and stated The [facility] appointment that patient has on on 11/25/2022 at 18:36 is a conflict with another appointment so patient needs to reschedule. Call back number is [phone].

## 2022-11-18 NOTE — Telephone Encounter (Signed)
Julie Phillips from [facility] called to request that the pt's INR & coumadin be checked within her [facility] fu tomorrow, 11/19/22 with Damita Dunnings? Elmo Putt stated it the pt was discharged Sunday, 11/14/22 & it was ordered for her INR & coumadin be checked after hosp discharge. Call back # [phone]

## 2022-11-18 NOTE — Telephone Encounter (Signed)
Tried to call Elmo Putt but no number was left for her.

## 2022-11-18 NOTE — Telephone Encounter (Signed)
Contacted Julie Phillips at [facility] and advised pt will have INR drawn tomorrow at hospital f/u apt with PCP. Advised to let pt know the coumadin clinic nurse will f/u with her tomorrow afternoon. Advised coumadin clinic apt will be cancelled for 12/14. [name] will let pt know and verbalized understanding.  Cancelled apt for 12/14 for coumadin clinic. Will assess INR results tomorrow for appropriate return coumadin clinic apt.

## 2022-11-18 NOTE — Patient Outreach (Signed)
  Care Coordination [facility] Note Transition Care Management Follow-up Telephone Call Date of discharge and from where: [facility] [phone] How have you been since you were released from the hospital? Tired and a little aching Any questions or concerns? Yes  Items Reviewed: Did the pt receive and understand the discharge instructions provided? Yes  Medications obtained and verified? Yes  Other? Yes  Patient discharge order said stop amiodarone and per patient she had talked with Dr Candis Musa office on Friday and was told to reduce it to 100 mg. Per patient she is going to keep taking the 100 mg Any new allergies since your discharge? No  Per patient the hospital she was allergic to Vit K caused difficulty breathing Dietary orders reviewed? No Do you have support at home? Yes   Home Care and Equipment/Supplies: Were home health services ordered? not applicable If so, what is the name of the agency? N   Has the agency set up a time to come to the patient's home? no Were any new equipment or medical supplies ordered?  No What is the name of the medical supply agency? N/a Were you able to get the supplies/equipment? no Do you have any questions related to the use of the equipment or supplies? No  Functional Questionnaire: (I = Independent and D = Dependent) ADLs: I  Bathing/Dressing- I  Meal Prep- I  Eating- I  Maintaining continence- I  Transferring/Ambulation- I  Managing Meds- I  Follow up appointments reviewed:  PCP Hospital f/u appt confirmed? Yes  Dr Damita Dunnings [phone] 10:00 [facility] f/u appt confirmed? Yes  Gerrie Nordmann NP 11/25/2022 10:55. Are transportation arrangements needed? No  If their condition worsens, is the pt aware to call PCP or go to the Emergency Dept.? Yes Was the patient provided with contact information for the PCP's office or ED? Yes Was to pt encouraged to call back with questions or concerns? Yes  SDOH assessments and interventions completed:    Yes SDOH Interventions Today    Flowsheet Row Most Recent Value  SDOH Interventions   Food Insecurity Interventions Intervention Not Indicated  Housing Interventions Intervention Not Indicated  Transportation Interventions Intervention Not Indicated  Utilities Interventions Intervention Not Indicated       Care Coordination Interventions:  RN contacted the coumadin clinic and spoke with Larene Beach due to conflict and coumadin times with Dr appt. Times changed.    Encounter Outcome:  Pt. Visit Completed    [facility] [phone]

## 2022-11-19 ENCOUNTER — Ambulatory Visit (INDEPENDENT_AMBULATORY_CARE_PROVIDER_SITE_OTHER): Payer: PPO | Admitting: Family Medicine

## 2022-11-19 ENCOUNTER — Encounter: Payer: Self-pay | Admitting: Family Medicine

## 2022-11-19 ENCOUNTER — Telehealth: Payer: Self-pay | Admitting: Family Medicine

## 2022-11-19 VITALS — BP 124/48 | HR 71 | Temp 98.2°F | Ht 60.0 in | Wt 226.0 lb

## 2022-11-19 DIAGNOSIS — Z7901 Long term (current) use of anticoagulants: Secondary | ICD-10-CM

## 2022-11-19 DIAGNOSIS — K922 Gastrointestinal hemorrhage, unspecified: Secondary | ICD-10-CM

## 2022-11-19 LAB — POCT INR: INR: 1.7 — AB (ref 2.0–3.0)

## 2022-11-19 MED ORDER — LOSARTAN POTASSIUM 100 MG PO TABS: 50.0000 mg | ORAL_TABLET | Freq: Every day | ORAL | Status: DC

## 2022-11-19 MED ORDER — WARFARIN SODIUM 5 MG PO TABS: ORAL_TABLET | ORAL | 1 refills | Status: DC

## 2022-11-19 NOTE — Progress Notes (Signed)
Follow-up with outpatient providers as requested   Discharge Diagnoses: Principal Problem:   Symptomatic anemia Active Problems:   Essential hypertension   Hypothyroid   Atrial fibrillation (HCC)   Coronary artery disease involving native coronary artery of native heart with angina pectoris ([place])   GI bleed   Hospital Course: 88 y.o. female with medical history significant of CHF, GERD, hypertension, hypothyroidism, atrial fibrillation on warfarin, who presented with a chief complaint of dyspnea, fatigue, and feeling faint, found to have symptomatic anemia.   12/3: EGD unremarkable.  12/4: colonoscopy planned for tomorrow to let Plavix and Coumadin washout   Assessment and Plan: * Symptomatic anemia - Patient has dyspnea even just from talking - Hemoglobin 11.2>> 7.6>9  Given report of melena, concern for upper GI bleed. - 1 unit packed red blood cells ordered at admission -Status post EGD on 12/3 -unremarkable, mild Schatzki's ring -- Colonoscopy also did not show any signs of bleeding stigmata -Continue Protonix -Discussion with GI and cardiology decision was made to hold Plavix and continue Coumadin at discharge.  She will need INR check in 2 to 3 days postdischarge to adjust Coumadin dosage   Essential hypertension - Continue losartan and metoprolol.   Hypothyroid - Continue Synthroid.   GI bleed - Melena for 1 week.  May have upper GI bleed which is exacerbated by supratherapeutic INR (4.0) and plavix - Hemoglobin 11.2>> 7.6> 9 - No further GI bleed while in the hospital.  H&H has remained stable.  EGD and colonoscopy did not show any obvious signs of bleeding stigmata.  Outpatient GI follow-up for video capsule endoscopy   Coronary artery disease involving native coronary artery of native heart with angina pectoris (HCC) - Hold Plavix in the setting of GI bleed.  Continue Coumadin   Atrial fibrillation (HCC) - Continue amiodarone - Continue metoprolol - Resume  Coumadin, holding Plavix at discharge   - Patient needs to continue all her home medicines (including amiodarone and zetia) as is (if in case, there was any med rec issues at discharge) except Plavix. I've switched her Omeprazole to Protonix 40 bid. I've conveyed this to Isac Caddy (PCP office) so they're aware.   ============================ Mildly lightheaded, using walker at baseline.  No black stools now.  She is back on zetia in the meantime.  D/w pt.    Warfarin '5mg'$  daily, 2 doses so far with today's dose pending.

## 2022-11-19 NOTE — Telephone Encounter (Signed)
INR 1.7 and will continue coumadin '5mg'$  a day for now with need for recheck in 11/25/22.

## 2022-11-19 NOTE — Telephone Encounter (Signed)
Pt was contacted. Documented in other telephone encounter for today.

## 2022-11-19 NOTE — Telephone Encounter (Signed)
Pt returned call. Advised to continue 1 tablet daily and made apt for INR check on 12/14 in coumadin clinic. Advised if any changes to contact office. Pt verbalized understanding.

## 2022-11-19 NOTE — Telephone Encounter (Signed)
LVM for pt to return call.  Last dosing for warfarin before hospitalization was 5 mg daily.   Pt does already have one apt with cardiology on 12/14 at 10:55. Could have INR performed at cardiology and call pt with instructions. Will discuss with pt to see how she would like to proceed.

## 2022-11-19 NOTE — Patient Instructions (Addendum)
Plan on follow up INR visit with Larene Beach on 11/25/22.   Keep taking '5mg'$  warfarin for now.   Take care.  Glad to see you. In the meantime, I would cut losartan down to '50mg'$  (1/2 tab).

## 2022-11-19 NOTE — Telephone Encounter (Signed)
PT calls today to follow up on recent diagnosis and to get started on INR monitoring and regulation.  CB: [phone]

## 2022-11-21 NOTE — Assessment & Plan Note (Signed)
Will continue on warfarin 5 mg a day for now.  Off Plavix.  Will follow-up with GI for consideration of capsule endoscopy. Plan on follow up INR visit with Larene Beach on 11/25/22.   In the meantime, I would cut losartan down to '50mg'$  (1/2 tab). Routine cautions given to patient.  At this point still okay for outpatient follow-up.  She agrees to plan.  36 minutes were devoted to patient care in this encounter (this includes time spent reviewing the patient's file/history, interviewing and examining the patient, counseling/reviewing plan with patient).

## 2022-11-22 ENCOUNTER — Telehealth: Payer: Self-pay | Admitting: Family Medicine

## 2022-11-22 DIAGNOSIS — I110 Hypertensive heart disease with heart failure: Secondary | ICD-10-CM | POA: Diagnosis not present

## 2022-11-22 DIAGNOSIS — I5030 Unspecified diastolic (congestive) heart failure: Secondary | ICD-10-CM | POA: Diagnosis not present

## 2022-11-22 DIAGNOSIS — E669 Obesity, unspecified: Secondary | ICD-10-CM | POA: Diagnosis not present

## 2022-11-22 DIAGNOSIS — I25119 Atherosclerotic heart disease of native coronary artery with unspecified angina pectoris: Secondary | ICD-10-CM | POA: Diagnosis not present

## 2022-11-22 DIAGNOSIS — I4819 Other persistent atrial fibrillation: Secondary | ICD-10-CM | POA: Diagnosis not present

## 2022-11-22 DIAGNOSIS — E039 Hypothyroidism, unspecified: Secondary | ICD-10-CM | POA: Diagnosis not present

## 2022-11-22 DIAGNOSIS — D649 Anemia, unspecified: Secondary | ICD-10-CM | POA: Diagnosis not present

## 2022-11-22 DIAGNOSIS — Z7901 Long term (current) use of anticoagulants: Secondary | ICD-10-CM | POA: Diagnosis not present

## 2022-11-22 DIAGNOSIS — K219 Gastro-esophageal reflux disease without esophagitis: Secondary | ICD-10-CM | POA: Diagnosis not present

## 2022-11-22 DIAGNOSIS — E782 Mixed hyperlipidemia: Secondary | ICD-10-CM | POA: Diagnosis not present

## 2022-11-22 NOTE — Telephone Encounter (Addendum)
Julie Phillips  Phone: [phone] (secure line)  Verbal order requested for PT 2 week two times One week 3 times   Starting this week  Reason: Following recent hospitalization

## 2022-11-22 NOTE — Telephone Encounter (Signed)
Verbal orders given to [facility]

## 2022-11-22 NOTE — Telephone Encounter (Signed)
Please give the order.  Thanks.   

## 2022-11-25 ENCOUNTER — Ambulatory Visit: Payer: PPO | Admitting: Cardiology

## 2022-11-25 ENCOUNTER — Ambulatory Visit: Payer: PPO

## 2022-11-25 ENCOUNTER — Other Ambulatory Visit (INDEPENDENT_AMBULATORY_CARE_PROVIDER_SITE_OTHER): Payer: PPO

## 2022-11-25 ENCOUNTER — Ambulatory Visit (INDEPENDENT_AMBULATORY_CARE_PROVIDER_SITE_OTHER): Payer: PPO

## 2022-11-25 VITALS — BP 106/52 | HR 61

## 2022-11-25 DIAGNOSIS — Z7901 Long term (current) use of anticoagulants: Secondary | ICD-10-CM

## 2022-11-25 DIAGNOSIS — K922 Gastrointestinal hemorrhage, unspecified: Secondary | ICD-10-CM

## 2022-11-25 LAB — CBC WITH DIFFERENTIAL/PLATELET
Basophils Absolute: 0.1 10*3/uL (ref 0.0–0.1)
Basophils Relative: 1.1 % (ref 0.0–3.0)
Eosinophils Absolute: 0.3 10*3/uL (ref 0.0–0.7)
Eosinophils Relative: 4.1 % (ref 0.0–5.0)
HCT: 26.2 % — ABNORMAL LOW (ref 36.0–46.0)
Hemoglobin: 9 g/dL — ABNORMAL LOW (ref 12.0–15.0)
Lymphocytes Relative: 18.2 % (ref 12.0–46.0)
Lymphs Abs: 1.4 10*3/uL (ref 0.7–4.0)
MCHC: 34.2 g/dL (ref 30.0–36.0)
MCV: 84.4 fl (ref 78.0–100.0)
Monocytes Absolute: 0.6 10*3/uL (ref 0.1–1.0)
Monocytes Relative: 7.8 % (ref 3.0–12.0)
Neutro Abs: 5.2 10*3/uL (ref 1.4–7.7)
Neutrophils Relative %: 68.8 % (ref 43.0–77.0)
Platelets: 334 10*3/uL (ref 150.0–400.0)
RBC: 3.11 Mil/uL — ABNORMAL LOW (ref 3.87–5.11)
RDW: 16.4 % — ABNORMAL HIGH (ref 11.5–15.5)
WBC: 7.5 10*3/uL (ref 4.0–10.5)

## 2022-11-25 LAB — BASIC METABOLIC PANEL
BUN: 21 mg/dL (ref 6–23)
CO2: 28 mEq/L (ref 19–32)
Calcium: 9.1 mg/dL (ref 8.4–10.5)
Chloride: 104 mEq/L (ref 96–112)
Creatinine, Ser: 1.03 mg/dL (ref 0.40–1.20)
GFR: 48.68 mL/min — ABNORMAL LOW (ref >60.00)
Glucose, Bld: 80 mg/dL (ref 70–99)
Potassium: 3.7 mEq/L (ref 3.5–5.1)
Sodium: 137 mEq/L (ref 135–145)

## 2022-11-25 LAB — POCT INR: INR: 3.5 — AB (ref 2.0–3.0)

## 2022-11-25 NOTE — Progress Notes (Signed)
Instructed pt to eat an extra serving of greens within the next 48 hours, states she will go home and have some broccoli with lunch.  PCP requested BP check today. BP is 106/52, HR 61, O2sat 96%  Hold dose today and then change weekly dosing to take 1 tablet daily except take 1/2 tablet on Wednesdays. Recheck in 4 weeks per pt request due to pt being out of town. Educated pt on signs and symptoms and bleeding and if she has any of these to contact the office or go to ER.

## 2022-11-25 NOTE — Patient Instructions (Addendum)
Pre visit review using our clinic review tool, if applicable. No additional management support is needed unless otherwise documented below in the visit note.  Hold dose today and then change weekly dosing to take 1 tablet daily except take 1/2 tablet on Wednesdays. Recheck in 3 weeks.

## 2022-11-25 NOTE — Progress Notes (Signed)
Cardiology Clinic Note   Patient Name: Julie Phillips Date of Encounter: 11/26/2022  Primary Care Provider:  Tonia Ghent, MD Primary Cardiologist:  Ida Rogue, MD  Patient Profile    88 year old female with a history of HFpEF, persistent atrial fibrillation, hypertension, hyperlipidemia, hypothyroidism, hearing loss, obesity, who presents today for hospital follow-up after being seen and evaluated for symptomatic anemia.  Past Medical History    Past Medical History:  Diagnosis Date   Arrhythmia    atrial fibrillation   CHF (congestive heart failure) (HCC)    Chronic heart failure with preserved ejection fraction (HFpEF) ([place])    a. 01/2022 Echo: EF 65-70%, no rwma, mild LVH, GrII DD, nl RV fxn, mildly dil LA, mild MR, ao scelrosis.   Fall    GERD (gastroesophageal reflux disease)    History of chicken pox    Hx of colonic polyps    Hyperlipidemia    Hypertension    Hypothyroidism    Keratopathy 2018   R eye   LVH (left ventricular hypertrophy)    Persistent atrial fibrillation ([place])    a. Initially dx in 2012; b. 01/2022 recurrent AF rvr noted-->warfarin added (CHA2DS2VASc = 5); c. 03/2022 Amio added-->DCCV x 1 (150J).   Past Surgical History:  Procedure Laterality Date   CARDIOVASCULAR STRESS TEST  2011   normal    CARDIOVERSION N/A 04/02/2022   Procedure: CARDIOVERSION;  Surgeon: Minna Merritts, MD;  Location: [facility] ORS;  Service: Cardiovascular;  Laterality: N/A;   COLONOSCOPY WITH PROPOFOL N/A 11/16/2022   Procedure: COLONOSCOPY WITH PROPOFOL;  Surgeon: Lesly Rubenstein, MD;  Location: [facility] ENDOSCOPY;  Service: Endoscopy;  Laterality: N/A;   CORONARY STENT INTERVENTION N/A 05/06/2022   Procedure: CORONARY STENT INTERVENTION;  Surgeon: Wellington Hampshire, MD;  Location: [place] [facility];  Service: Cardiovascular;  Laterality: N/A;   ESOPHAGOGASTRODUODENOSCOPY (EGD) WITH PROPOFOL N/A 11/14/2022   Procedure: ESOPHAGOGASTRODUODENOSCOPY (EGD) WITH  PROPOFOL;  Surgeon: Lesly Rubenstein, MD;  Location: [facility] ENDOSCOPY;  Service: Endoscopy;  Laterality: N/A;   keratopathy     LEFT HEART CATH AND CORONARY ANGIOGRAPHY N/A 05/06/2022   Procedure: LEFT HEART CATH AND CORONARY ANGIOGRAPHY;  Surgeon: Wellington Hampshire, MD;  Location: [place] [facility];  Service: Cardiovascular;  Laterality: N/A;    Allergies  Allergies  Allergen Reactions   Statins     Myalgias   Vitamin K And Related Shortness Of Breath   Wheat     Throat swelling   Jardiance [Empagliflozin] Other (See Comments)    Dry mouth, "gasping for breath"   Amlodipine     hematuria   Eucalyptus Oil    Lisinopril     Unrecalled BP med- possibly lisinopril- intolerant, had cough on the medicine- changed to cozaar   Rice Nausea And Vomiting    History of Present Illness    Julie Phillips is an 88 year old female with previously mentioned past medical history clued HFpEF, persistent atrial fibrillation on chronic anticoagulation with Coumadin, hypertension, hyperlipidemia, hypothyroidism, frequent falls, obesity, and recent hospitalization for symptomatic anemia.  She was initially diagnosed with atrial fibrillation in the setting of hypokalemia due to cirrhosis.  Echo at that time showed prover conserved EF and mild MR and TR.  Stress test was nonischemic.  She was initially placed on warfarin but this had to be discontinued due to frequent falls.  In February 2020 she has been at [facility] for chest pressure mildly elevated high-sensitivity troponin in the setting  of atrial fibrillation with RVR.  Repeat echocardiogram revealed LVEF 65 to 70%, no regional wall motion abnormalities, mild LVH, G2 DD, mildly dilated left atrium, mild MR and aortic valve sclerosis without stenosis.  In April 2023 she presented to primary care with complaints of dizziness found to be in atrial fibrillation RVR with rates of 140s.  She was transferred to [facility] emergency department and her BNP was elevated  at 696.  She was treated with intravenous Lasix.  She was placed on IV amiodarone and subsequently underwent successful cardioversion 04/02/22.  Amiodarone was transitioned to oral dosing and she was discharged 04/22/2022.  She underwent coronary CT which revealed coronary calcium score of 2 which was 41 percentile for age and sex matched control normal coronary origin with right dominance, calcified plaque causing severe proximal mid LAD stenosis calcified plaque with moderate proximal left circumflex stenosis with a CAD-RADS 4.  She presented for elective heart catheterization 05/06/2022 where she had successful PCI/DES to the mid LAD.  She was last seen in clinic 11/12/2022 with varying symptoms.  She was having shortness of breath; all of a sudden and difficulty swallowing.  She also felt as though that she was dizzy and may pass out at times.  She had a family with birth that accompanied her on her visit and that day.  After she had 2 episodes in clinic of her symptoms she was sent to the emergency department for further evaluation.  She was evaluated in the [facility] emergency department 11/12/2022 with shortness of breath.  Patient stated that she was having sporadic episodes over the past year where she become acutely short of breath and feel like she is having a hard time breathing however over the past week the episodes become more frequent chest a couple of episodes in clinic and was advised to report to the emergency department.  Initial vital signs reveal blood pressure 102/87, pulse of 61, respirations of 16, temperature of 98.1.  Per labs revealed a BUN of 31, serum creatinine of 1.22, with a GFR 43, RBCs of 2.79, hemoglobin of 7.6, and a BNP of 180.1.  She had concerns for GI bleeding causing the main anemia due to her hemoglobin roughly a month prior of 11.2 so she was subsequently admitted to the hospital under the hospitalist service.  She was transfused had her warfarin placed on hold and GI  consultation was completed with endoscopic done 11/14/2022 with a EGD that was unremarkable and colonoscopy completed on 11/16/2022 which revealed pretty normal findings of likely patient has AVMs.  She returns to clinic today stating that she feels better and only has been experiencing exertional dyspnea. She denies chest pain, dysphagia, dizziness, lightheadedness, or peripheral edema. She states that since decreasing her amiodarone she feels like she has more energy and was able to go shopping yesterday and was excited. She states that the only new medication that she is taking is protonix and she took 40 mg last evening and had a horrible night of being sick to her stomach and feeling poorly, so she will no longer take 40 mg twice daily. She did have blood work done with a stable Hgb of 9.0 and INR of 3.5.   Home Medications    Current Outpatient Medications  Medication Sig Dispense Refill   amiodarone (PACERONE) 100 MG tablet Take 1 tablet (100 mg total) by mouth daily. 90 tablet 3   diltiazem (CARDIZEM CD) 120 MG 24 hr capsule Take 1 capsule (120 mg total) by  mouth daily. 90 capsule 3   furosemide (LASIX) 20 MG tablet TAKE 1 TABLET BY MOUTH 2 TIMES DAILY 180 tablet 0   gabapentin (NEURONTIN) 100 MG capsule TAKE 1 CAPSULE BY MOUTH 3 TIMES DAILY ASNEEDED 270 capsule 3   levothyroxine (SYNTHROID) 100 MCG tablet TAKE 1 TABLET BY MOUTH ONCE A DAY BEFOREBREAKFAST. 90 tablet 3   losartan (COZAAR) 100 MG tablet Take 0.5 tablets (50 mg total) by mouth daily.     LOSARTAN POTASSIUM PO Apply 0.8 mg to eye in the morning, at noon, in the evening, and at bedtime.     metoprolol succinate (TOPROL-XL) 50 MG 24 hr tablet Take 1 tablet (50 mg) by mouth once daily. Take with or immediately following a meal. 180 tablet 0   nitroGLYCERIN (NITROSTAT) 0.4 MG SL tablet Place 1 tablet (0.4 mg total) under the tongue every 5 (five) minutes as needed for chest pain. 25 tablet 3   Polyethyl Glycol-Propyl Glycol (SYSTANE)  0.4-0.3 % GEL ophthalmic gel Place 1 application into both eyes.     potassium chloride (KLOR-CON) 10 MEQ tablet TAKE 1 TABLET BY MOUTH ONCE A DAY 90 tablet 1   prednisoLONE acetate (PRED FORTE) 1 % ophthalmic suspension Place 1 drop into the right eye 2 (two) times daily.     traZODone (DESYREL) 50 MG tablet Take 0.5-1 tablets (25-50 mg total) by mouth at bedtime as needed for sleep. Hold for patient request. 30 tablet 1   warfarin (COUMADIN) 5 MG tablet TAKE 1 TABLET (5 MG) BY MOUTH DAILY 110 tablet 1   pantoprazole (PROTONIX) 20 MG tablet Take 1 tablet (20 mg total) by mouth 2 (two) times daily before a meal. 180 tablet 3   No current facility-administered medications for this visit.     Family History    Family History  Problem Relation Age of Onset   Heart disease Mother    Heart disease Father    Breast cancer Sister    Breast cancer Daughter    Colon cancer Neg Hx    She indicated that her mother is deceased. She indicated that her father is deceased. She indicated that her sister is alive. She indicated that three of her four brothers are alive. She indicated that her daughter is deceased. She indicated that the status of her neg hx is unknown.  Social History    Social History   Socioeconomic History   Marital status: Widowed    Spouse name: Not on file   Number of children: Not on file   Years of education: Not on file   Highest education level: Not on file  Occupational History   Occupation: Retired Armed forces technical officer: RETIRED  Tobacco Use   Smoking status: Former    Packs/day: 2.00    Years: 20.00    Total pack years: 40.00    Types: Cigarettes    Quit date: 12/14/1971    Years since quitting: 50.9   Smokeless tobacco: Never  Vaping Use   Vaping Use: Never used  Substance and Sexual Activity   Alcohol use: Not Currently   Drug use: No   Sexual activity: Not Currently  Other Topics Concern   Not on file  Social History Narrative   Divorced    Education:  [employer]   8 pregnancies, 6 live births   LMP: 1985   Lives in independent living.     Daughter died 2022/03/03   Social Determinants of Radio broadcast assistant  Strain: Low Risk  (06/24/2022)   Overall Financial Resource Strain (CARDIA)    Difficulty of Paying Living Expenses: Not hard at all  Food Insecurity: No Food Insecurity (11/18/2022)   Hunger Vital Sign    Worried About Running Out of Food in the Last Year: Never true    Ran Out of Food in the Last Year: Never true  Transportation Needs: No Transportation Needs (11/18/2022)   PRAPARE - Hydrologist (Medical): No    Lack of Transportation (Non-Medical): No  Physical Activity: Inactive (06/24/2022)   Exercise Vital Sign    Days of Exercise per Week: 0 days    Minutes of Exercise per Session: 0 min  Stress: No Stress Concern Present (06/24/2022)   [place]    Feeling of Stress : Not at all  Social Connections: Not on file  Intimate Partner Violence: Not At Risk (11/13/2022)   Humiliation, Afraid, Rape, and Kick questionnaire    Fear of Current or Ex-Partner: No    Emotionally Abused: No    Physically Abused: No    Sexually Abused: No     Review of Systems    General:  No chills, fever, night sweats or weight changes. Endorses exertional fatigue Cardiovascular:  No chest pain, endorses exertional dyspnea on exertion, endorses occasional edema, orthopnea, palpitations, paroxysmal nocturnal dyspnea. Dermatological: No rash, lesions/masses Respiratory: No cough, dyspnea Urologic: No hematuria, dysuria Abdominal:   No nausea, vomiting, diarrhea, bright red blood per rectum, melena, or hematemesis Neurologic:  No visual changes, wkns, changes in mental status. All other systems reviewed and are otherwise negative except as noted above.   Physical Exam    VS:  BP 105/66 (BP Location: Left Arm, Patient Position:  Sitting, Cuff Size: Large)   Pulse (!) 59   Ht '5\' 1"'$  (1.549 m)   Wt 226 lb 3.2 oz (102.6 kg)   SpO2 95%   BMI 42.74 kg/m  , BMI Body mass index is 42.74 kg/m. STOP-Bang Score:  6      GEN: Well nourished, well developed, in no acute distress. HEENT: normal. Neck: Supple, no JVD, carotid bruits, or masses. Cardiac: RRR, I/VI murmur, without rubs, or gallops. No clubbing, cyanosis, edema.  Radials 2+/PT 2+ and equal bilaterally.  Respiratory:  Respirations regular and unlabored, clear to auscultation bilaterally. GI: Soft, nontender, nondistended, BS + x 4. MS: no deformity or atrophy. Skin: warm and dry, no rash. Neuro:  Strength and sensation are intact. Psych: Normal affect.  Accessory Clinical Findings    ECG personally reviewed by me today- sinus bradycardia rate of 59 - No acute changes  Lab Results  Component Value Date   WBC 7.5 11/25/2022   HGB 9.0 (L) 11/25/2022   HCT 26.2 Repeated and verified X2. (L) 11/25/2022   MCV 84.4 11/25/2022   PLT 334.0 11/25/2022   Lab Results  Component Value Date   CREATININE 1.03 11/25/2022   BUN 21 11/25/2022   NA 137 11/25/2022   K 3.7 11/25/2022   CL 104 11/25/2022   CO2 28 11/25/2022   Lab Results  Component Value Date   ALT 13 11/13/2022   AST 18 11/13/2022   ALKPHOS 58 11/13/2022   BILITOT 0.8 11/13/2022   Lab Results  Component Value Date   CHOL 238 (H) 08/12/2022   HDL 54.40 08/12/2022   LDLCALC 161 (H) 08/12/2022   LDLDIRECT 152.6 12/31/2013   TRIG 112.0 08/12/2022  CHOLHDL 4 08/12/2022    No results found for: "HGBA1C"  Assessment & Plan   1.  Shortness of breath from anemia that has improved.  Patient was recently admitted for symptomatic anemia and was transfused.  She underwent an endoscopy and colonoscopy without significant findings patient was told she likely has AVMs.  Hemoglobin remained stable at 9.  She has remained off Plavix.   2.  Recent GI bleed where she had melena x 1 week.  Likely  exacerbated by supratherapeutic INR 4.0 and clopidogrel.  The hemoglobin was 11.2 dropped to 7.6 and after transfusions and scope she has remained stable at 9.  EGD and colonoscopy did not show any signs of bleeding.  Outpatient GI follow-up she has been scheduled for a capsule study with GI.  She was recently placed on Protonix 40 mg twice daily but states she does have been adverse side effects advised to go ahead and not stop the Protonix altogether but drop and then 20 mg twice daily and see how she tolerates at that time.  3.  Persistent atrial fibrillation status post cardioversion currently in sinus rhythm with a rate of 59 on EKG.  She has been continued on diltiazem 120 mg daily and Toprol-XL 50 mg daily and warfarin for CHA2DS2-VASc score of at least 5.  Her last INR was 3.5 which she was advised to hold for 1 night.  4.  Essential hypertension with blood pressure 105/66 today.  Blood pressures remained stable.  She is continued on furosemide 20 mg twice daily, losartan 100 mg a half a tablet daily, Toprol-XL 50 mg daily.  She is encouraged to continue to monitor her pressures at home.  5.  Hyperlipidemia with LDL of 161.  She has been intolerant to statins in the past and also failed on Zetia.  Will discuss PCSK9 inhibitor on return.  6.  Coronary artery disease status post PCI/DES to the mid LAD in 04/2022.  Exertional dyspnea but stable angina.  Clopidogrel has been stopped due to acute symptomatic blood loss.  She is statin intolerant and did not tolerate Zetia.  She is apprehensive to starting any medications today we will discuss PCSK9 inhibitors on return.  She is continued on warfarin in lieu of aspirin.  7.  Sleep disordered breathing with a STOP-BANG score of 6.  She has been referred to pulmonary and has a sleep study scheduled.  8.  Disposition patient return to clinic in January to see Dr. Rockey Situ on previous scheduled appointment or sooner if needed.  Timmy Cleverly,  NP 11/26/2022, 1:27 PM

## 2022-11-26 ENCOUNTER — Ambulatory Visit: Payer: PPO | Attending: Cardiology | Admitting: Cardiology

## 2022-11-26 ENCOUNTER — Encounter: Payer: Self-pay | Admitting: Cardiology

## 2022-11-26 VITALS — BP 105/66 | HR 59 | Ht 61.0 in | Wt 226.2 lb

## 2022-11-26 DIAGNOSIS — I10 Essential (primary) hypertension: Secondary | ICD-10-CM | POA: Diagnosis not present

## 2022-11-26 DIAGNOSIS — G4730 Sleep apnea, unspecified: Secondary | ICD-10-CM

## 2022-11-26 DIAGNOSIS — D649 Anemia, unspecified: Secondary | ICD-10-CM | POA: Diagnosis not present

## 2022-11-26 DIAGNOSIS — E782 Mixed hyperlipidemia: Secondary | ICD-10-CM

## 2022-11-26 DIAGNOSIS — K922 Gastrointestinal hemorrhage, unspecified: Secondary | ICD-10-CM

## 2022-11-26 DIAGNOSIS — I25118 Atherosclerotic heart disease of native coronary artery with other forms of angina pectoris: Secondary | ICD-10-CM

## 2022-11-26 DIAGNOSIS — I4819 Other persistent atrial fibrillation: Secondary | ICD-10-CM

## 2022-11-26 DIAGNOSIS — R0602 Shortness of breath: Secondary | ICD-10-CM

## 2022-11-26 MED ORDER — PANTOPRAZOLE SODIUM 20 MG PO TBEC: 20.0000 mg | DELAYED_RELEASE_TABLET | Freq: Two times a day (BID) | ORAL | 3 refills | Status: DC

## 2022-11-26 NOTE — Patient Instructions (Signed)
Medication Instructions:  DECREASE the Pantoprazole (Protonix) to 20 mg twice daily  *If you need a refill on your cardiac medications before your next appointment, please call your pharmacy*   Lab Work: None ordered If you have labs (blood work) drawn today and your tests are completely normal, you will receive your results only by: [place] (if you have MyChart) OR A paper copy in the mail If you have any lab test that is abnormal or we need to change your treatment, we will call you to review the results.   Testing/Procedures: None ordered   Follow-Up: At [facility], you and your health needs are our priority.  As part of our continuing mission to provide you with exceptional heart care, we have created designated Provider Care Teams.  These Care Teams include your primary Cardiologist (physician) and Advanced Practice Providers (APPs -  Physician Assistants and Nurse Practitioners) who all work together to provide you with the care you need, when you need it.  We recommend signing up for the patient portal called "MyChart".  Sign up information is provided on this After Visit Summary.  MyChart is used to connect with patients for Virtual Visits (Telemedicine).  Patients are able to view lab/test results, encounter notes, upcoming appointments, etc.  Non-urgent messages can be sent to your provider as well.   To learn more about what you can do with MyChart, go to [email or web address].    Your next appointment:   Keep follow up as scheduled.   Important Information About Sugar

## 2022-11-28 ENCOUNTER — Other Ambulatory Visit: Payer: Self-pay | Admitting: Family Medicine

## 2022-11-28 DIAGNOSIS — Z7901 Long term (current) use of anticoagulants: Secondary | ICD-10-CM

## 2022-11-29 NOTE — Progress Notes (Signed)
LVM for pt to return call for lab results.

## 2022-12-01 ENCOUNTER — Encounter: Payer: Self-pay | Admitting: Family Medicine

## 2022-12-01 DIAGNOSIS — D649 Anemia, unspecified: Secondary | ICD-10-CM

## 2022-12-01 DIAGNOSIS — I110 Hypertensive heart disease with heart failure: Secondary | ICD-10-CM

## 2022-12-01 DIAGNOSIS — Z6841 Body Mass Index (BMI) 40.0 and over, adult: Secondary | ICD-10-CM

## 2022-12-01 DIAGNOSIS — I4819 Other persistent atrial fibrillation: Secondary | ICD-10-CM

## 2022-12-01 DIAGNOSIS — E039 Hypothyroidism, unspecified: Secondary | ICD-10-CM

## 2022-12-01 DIAGNOSIS — K219 Gastro-esophageal reflux disease without esophagitis: Secondary | ICD-10-CM

## 2022-12-01 DIAGNOSIS — Z7901 Long term (current) use of anticoagulants: Secondary | ICD-10-CM

## 2022-12-01 DIAGNOSIS — I5030 Unspecified diastolic (congestive) heart failure: Secondary | ICD-10-CM

## 2022-12-01 DIAGNOSIS — E782 Mixed hyperlipidemia: Secondary | ICD-10-CM

## 2022-12-01 DIAGNOSIS — I25119 Atherosclerotic heart disease of native coronary artery with unspecified angina pectoris: Secondary | ICD-10-CM

## 2022-12-01 DIAGNOSIS — E669 Obesity, unspecified: Secondary | ICD-10-CM

## 2022-12-02 ENCOUNTER — Ambulatory Visit: Payer: PPO | Admitting: Primary Care

## 2022-12-02 DIAGNOSIS — R0683 Snoring: Secondary | ICD-10-CM

## 2022-12-02 DIAGNOSIS — G4733 Obstructive sleep apnea (adult) (pediatric): Secondary | ICD-10-CM

## 2022-12-02 NOTE — Telephone Encounter (Signed)
Pt contacted coumadin clinic nurse to RS her apt because she will not be going out of town.   Discussed her message and explained results again.  She would like to know if there is any treatment planned for her anemia. Advised pt I would add that to her msg for her PCP. Pt appreciative and verbalized understanding.

## 2022-12-03 ENCOUNTER — Other Ambulatory Visit: Payer: Self-pay

## 2022-12-03 ENCOUNTER — Emergency Department: Payer: PPO

## 2022-12-03 ENCOUNTER — Emergency Department
Admission: EM | Admit: 2022-12-03 | Discharge: 2022-12-03 | Disposition: A | Discharge status: Home / Self Care | Payer: PPO | Attending: Emergency Medicine | Admitting: Emergency Medicine

## 2022-12-03 DIAGNOSIS — M545 Low back pain, unspecified: Secondary | ICD-10-CM | POA: Diagnosis not present

## 2022-12-03 DIAGNOSIS — W19XXXA Unspecified fall, initial encounter: Secondary | ICD-10-CM | POA: Diagnosis not present

## 2022-12-03 DIAGNOSIS — Z043 Encounter for examination and observation following other accident: Secondary | ICD-10-CM | POA: Diagnosis not present

## 2022-12-03 DIAGNOSIS — I509 Heart failure, unspecified: Secondary | ICD-10-CM | POA: Insufficient documentation

## 2022-12-03 DIAGNOSIS — E039 Hypothyroidism, unspecified: Secondary | ICD-10-CM | POA: Diagnosis not present

## 2022-12-03 DIAGNOSIS — R2242 Localized swelling, mass and lump, left lower limb: Secondary | ICD-10-CM | POA: Diagnosis not present

## 2022-12-03 DIAGNOSIS — S0990XA Unspecified injury of head, initial encounter: Secondary | ICD-10-CM | POA: Insufficient documentation

## 2022-12-03 DIAGNOSIS — I110 Hypertensive heart disease with heart failure: Secondary | ICD-10-CM | POA: Diagnosis not present

## 2022-12-03 DIAGNOSIS — I6782 Cerebral ischemia: Secondary | ICD-10-CM | POA: Diagnosis not present

## 2022-12-03 NOTE — ED Notes (Signed)
See triage note  Presents s/p fall  States she slipped getting out of car    States she lost her balance  Fell backwards and hit her head  No LOC

## 2022-12-03 NOTE — ED Triage Notes (Signed)
Reports was trying to get in the car and slipped and fell backwards landing on back and striking head. Patient is on coumadin.  Denies headache.

## 2022-12-03 NOTE — ED Notes (Addendum)
[facility], pt was bringing a cpap machine back to the hospital when she was getting back into her car, lost her balance and fell, pt reports she hit the back of her head and takes coumadin, would like to be evaluated in the ED. Denies LOC, A/O x 4 upon my arrival. Pt transported to ED via wheelchair by myself and Raquel, RN

## 2022-12-03 NOTE — ED Provider Notes (Signed)
[facility] Provider Note    Event Date/Time   First MD Initiated Contact with Patient 12/03/22 1217     (approximate)   History   Chief Complaint Fall   HPI  Julie Phillips is a 88 y.o. female with past medical history of hypertension, CHF, GERD, hypothyroidism, and atrial fibrillation on Coumadin who presents to the ED following fall.  Patient reports that just prior to arrival she was attempting to get into her car when she lost her balance and fell backwards.  She struck the back of her head but did not lose consciousness and denies significant headache.  She also denies any pain in her neck, but does endorse pain in the middle of her lower back.  She has also noticed some pain and swelling over the lateral portion of her left ankle, however she has been able to bear weight on her left foot since the fall.  She denies any pain to her chest, abdomen, or upper extremities.     Physical Exam   Triage Vital Signs: ED Triage Vitals  Enc Vitals Group     BP 12/03/22 1106 127/60     Pulse Rate 12/03/22 1106 61     Resp 12/03/22 1106 18     Temp 12/03/22 1106 97.9 F (36.6 C)     Temp Source 12/03/22 1106 Oral     SpO2 --      Weight 12/03/22 1107 226 lb (102.5 kg)     Height 12/03/22 1107 '5\' 1"'$  (1.549 m)     Head Circumference --      Peak Flow --      Pain Score 12/03/22 1106 4     Pain Loc --      Pain Edu? --      Excl. in [place]? --     Most recent vital signs: Vitals:   12/03/22 1106  BP: 127/60  Pulse: 61  Resp: 18  Temp: 97.9 F (36.6 C)    Constitutional: Alert and oriented. Eyes: Conjunctivae are normal. Head: Atraumatic. Nose: No congestion/rhinnorhea. Mouth/Throat: Mucous membranes are moist.  Neck: No midline cervical spine tenderness to palpation. Cardiovascular: Normal rate, regular rhythm. Grossly normal heart sounds.  2+ radial pulses bilaterally. Respiratory: Normal respiratory effort.  No retractions. Lungs  CTAB. Gastrointestinal: Soft and nontender. No distention. Musculoskeletal: Mild edema over the lateral malleolus of left ankle with associated tenderness, no obvious deformity noted.  No tenderness to palpation noted in left foot.  No right lower extremity bony tenderness to palpation noted and no upper extremity bony tenderness to palpation noted. Neurologic:  Normal speech and language. No gross focal neurologic deficits are appreciated.    ED Results / Procedures / Treatments   Labs (all labs ordered are listed, but only abnormal results are displayed) Labs Reviewed - No data to display  RADIOLOGY CT head reviewed and interpreted by me with no hemorrhage or midline shift.  PROCEDURES:  Critical Care performed: No  Procedures   MEDICATIONS ORDERED IN ED: Medications - No data to display   IMPRESSION / MDM / [place] / ED COURSE  I reviewed the triage vital signs and the nursing notes.                              88 y.o. female with past medical history of hypertension, CHF, GERD, hypothyroidism, and atrial fibrillation on Coumadin who presents to the ED  following a fall where she lost her balance and fell backwards, striking her head but not losing consciousness.  Patient's presentation is most consistent with acute complicated illness / injury requiring diagnostic workup.  Differential diagnosis includes, but is not limited to, intracranial injury, cervical spine injury, lumbar spine injury, ankle fracture, ankle sprain.  Patient well-appearing and in no acute distress, vital signs are unremarkable.  CT head performed from triage is negative for acute process, no findings on exam to suggest cervical spine injury.  She does have tenderness to palpation over her lumbar spine which we will further assess with x-ray.  X-ray imaging of left ankle from triage is also negative for acute process, she does have some edema over the lateral malleolus and suspect sprain.   Patient declines pain medication, plan to reassess following imaging of her lumbar spine.  Lumbar x-ray is negative for acute process, patient is appropriate for discharge home with PCP follow-up.  She was counseled to return to the ED for new or worsening symptoms, patient agrees with plan.      FINAL CLINICAL IMPRESSION(S) / ED DIAGNOSES   Final diagnoses:  Fall, initial encounter  Injury of head, initial encounter     Rx / DC Orders   ED Discharge Orders     None        Note:  This document was prepared using Dragon voice recognition software and may include unintentional dictation errors.   Blake Divine, MD 12/03/22 [phone]

## 2022-12-04 NOTE — Telephone Encounter (Signed)
Please call patient.  See if she has been contacted by GI about follow-up.  See below.          Julie Rubenstein, MD. Schedule an appointment as soon as possible for a visit in 3 week(s).   Specialty: Gastroenterology Why: [facility] Discharge F/UP Contact information: [address] [place] [address] [phone]     If she has not been contacted by GI, let me know so I can put in a referral.  Her hemoglobin had improved on recheck but it was not yet back to normal.  I would like to recheck her hemoglobin at her next INR visit.  My hope is that by being on Coumadin and not on Plavix, her blood counts will continue to improve.  Thanks.

## 2022-12-07 ENCOUNTER — Telehealth: Payer: Self-pay | Admitting: Family Medicine

## 2022-12-07 NOTE — Telephone Encounter (Signed)
[place]  Just FYI:  [place] on the 22nd while returning CPAP to hospital, Sprained left ankle, ankle has gone down patient is fine.  Slid to floor on Saturday 12.23, EMT had to help her up, no injuries sustained

## 2022-12-09 NOTE — Telephone Encounter (Signed)
Please get update on patient.  I am open to any ideas that can help decrease her fall risk.  Thanks.

## 2022-12-10 ENCOUNTER — Other Ambulatory Visit: Payer: Self-pay | Admitting: Family Medicine

## 2022-12-10 DIAGNOSIS — D649 Anemia, unspecified: Secondary | ICD-10-CM

## 2022-12-10 NOTE — Telephone Encounter (Signed)
I put in the referral.  Thanks.  

## 2022-12-10 NOTE — Progress Notes (Signed)
I put in the referral.  Thanks.  

## 2022-12-10 NOTE — Telephone Encounter (Signed)
Noted. Thanks.

## 2022-12-10 NOTE — Telephone Encounter (Signed)
Please call the GI clinic.  Please see about getting her seen in the near future.  Per discharge summary "EGD and colonoscopy did not show any obvious signs of bleeding stigmata. Outpatient GI follow-up for video capsule endoscopy".    Please let me know if she needs a new referral.  Thanks.

## 2022-12-10 NOTE — Telephone Encounter (Signed)
Spoke with patient and she stated she is doing okay. Still has a little swelling in ankle but its getting better.

## 2022-12-14 ENCOUNTER — Encounter: Payer: Self-pay | Admitting: *Deleted

## 2022-12-15 DIAGNOSIS — G4733 Obstructive sleep apnea (adult) (pediatric): Secondary | ICD-10-CM | POA: Diagnosis not present

## 2022-12-15 DIAGNOSIS — H18891 Other specified disorders of cornea, right eye: Secondary | ICD-10-CM | POA: Diagnosis not present

## 2022-12-15 DIAGNOSIS — H18421 Band keratopathy, right eye: Secondary | ICD-10-CM | POA: Diagnosis not present

## 2022-12-16 ENCOUNTER — Other Ambulatory Visit (INDEPENDENT_AMBULATORY_CARE_PROVIDER_SITE_OTHER): Payer: PPO

## 2022-12-16 ENCOUNTER — Ambulatory Visit (INDEPENDENT_AMBULATORY_CARE_PROVIDER_SITE_OTHER): Payer: PPO

## 2022-12-16 VITALS — BP 118/56 | HR 64

## 2022-12-16 DIAGNOSIS — Z7901 Long term (current) use of anticoagulants: Secondary | ICD-10-CM

## 2022-12-16 DIAGNOSIS — D649 Anemia, unspecified: Secondary | ICD-10-CM

## 2022-12-16 LAB — CBC WITH DIFFERENTIAL/PLATELET
Basophils Absolute: 0.1 10*3/uL (ref 0.0–0.1)
Basophils Relative: 1.1 % (ref 0.0–3.0)
Eosinophils Absolute: 0.3 10*3/uL (ref 0.0–0.7)
Eosinophils Relative: 2.7 % (ref 0.0–5.0)
HCT: 27.6 % — ABNORMAL LOW (ref 36.0–46.0)
Hemoglobin: 9 g/dL — ABNORMAL LOW (ref 12.0–15.0)
Lymphocytes Relative: 12.1 % (ref 12.0–46.0)
Lymphs Abs: 1.2 10*3/uL (ref 0.7–4.0)
MCHC: 32.7 g/dL (ref 30.0–36.0)
MCV: 81.4 fl (ref 78.0–100.0)
Monocytes Absolute: 0.6 10*3/uL (ref 0.1–1.0)
Monocytes Relative: 6.5 % (ref 3.0–12.0)
Neutro Abs: 7.4 10*3/uL (ref 1.4–7.7)
Neutrophils Relative %: 77.6 % — ABNORMAL HIGH (ref 43.0–77.0)
Platelets: 398 10*3/uL (ref 150.0–400.0)
RBC: 3.4 Mil/uL — ABNORMAL LOW (ref 3.87–5.11)
RDW: 16.8 % — ABNORMAL HIGH (ref 11.5–15.5)
WBC: 9.6 10*3/uL (ref 4.0–10.5)

## 2022-12-16 LAB — BASIC METABOLIC PANEL
BUN: 25 mg/dL — ABNORMAL HIGH (ref 6–23)
CO2: 28 mEq/L (ref 19–32)
Calcium: 9.4 mg/dL (ref 8.4–10.5)
Chloride: 102 mEq/L (ref 96–112)
Creatinine, Ser: 1.31 mg/dL — ABNORMAL HIGH (ref 0.40–1.20)
GFR: 36.46 mL/min — ABNORMAL LOW (ref >60.00)
Glucose, Bld: 94 mg/dL (ref 70–99)
Potassium: 3.8 mEq/L (ref 3.5–5.1)
Sodium: 137 mEq/L (ref 135–145)

## 2022-12-16 LAB — POCT INR: INR: 5 — AB (ref 2.0–3.0)

## 2022-12-16 LAB — FERRITIN: Ferritin: 28.6 ng/mL (ref 10.0–291.0)

## 2022-12-16 NOTE — Patient Instructions (Addendum)
Pre visit review using our clinic review tool, if applicable. No additional management support is needed unless otherwise documented below in the visit note.  Hold dose today and hold dose to tomorrow and then change weekly dose to take 1 tablet daily except take 1/2 tablet on Mondays, Wednesdays and Fridays.

## 2022-12-16 NOTE — Progress Notes (Addendum)
Pt reported today that she receives her medication in pill packs and has two pills in her evening medications, pantoprazole and warfarin. She said they are very similar in color and shape. She is not sure if she is getting them missed up when her warfarin dosing is changing to 1/2 tablet. She is going to talk to the pharmacy about placing warfarin in a bottle and not in the pill pack or at least advising her on which is which. Advised if changes or need an assistance to contact the coumadin clinic. Pt verbalized understanding.  Hold dose today and hold dose to tomorrow and then change weekly dose to take 1 tablet daily except take 1/2 tablet on Mondays, Wednesdays and Fridays. Educated pt on signs and symptoms and bleeding and if she has any of these to contact the office or go to ER.   Per provider, blood pressure check today with labs. BP 118/56, HR 64, O2sat 96%

## 2022-12-19 ENCOUNTER — Other Ambulatory Visit: Payer: Self-pay | Admitting: Family Medicine

## 2022-12-19 MED ORDER — LOSARTAN POTASSIUM 100 MG PO TABS: ORAL_TABLET | ORAL | Status: DC

## 2022-12-20 ENCOUNTER — Telehealth: Payer: Self-pay | Admitting: Nurse Practitioner

## 2022-12-20 NOTE — Progress Notes (Signed)
Sleep study showed moderate OSA, needs visit to discuss results and right treatment options for her

## 2022-12-20 NOTE — Telephone Encounter (Signed)
PT said we were trying to call. I see no notes, no recalls, etc. She does need her Sleep results. Can you see who may have called her too? Thanks.

## 2022-12-20 NOTE — Telephone Encounter (Signed)
Martyn Ehrich, NP 12/20/2022  9:47 AM EST     Sleep study showed moderate OSA, needs visit to discuss results and right treatment options for her     Attempted to call pt but unable to reach. Left message to return call.

## 2022-12-21 ENCOUNTER — Other Ambulatory Visit: Payer: Self-pay | Admitting: Family Medicine

## 2022-12-21 DIAGNOSIS — K219 Gastro-esophageal reflux disease without esophagitis: Secondary | ICD-10-CM | POA: Diagnosis not present

## 2022-12-21 DIAGNOSIS — I25119 Atherosclerotic heart disease of native coronary artery with unspecified angina pectoris: Secondary | ICD-10-CM | POA: Diagnosis not present

## 2022-12-21 DIAGNOSIS — E782 Mixed hyperlipidemia: Secondary | ICD-10-CM | POA: Diagnosis not present

## 2022-12-21 DIAGNOSIS — D649 Anemia, unspecified: Secondary | ICD-10-CM | POA: Diagnosis not present

## 2022-12-21 DIAGNOSIS — E039 Hypothyroidism, unspecified: Secondary | ICD-10-CM | POA: Diagnosis not present

## 2022-12-21 DIAGNOSIS — Z7901 Long term (current) use of anticoagulants: Secondary | ICD-10-CM

## 2022-12-21 DIAGNOSIS — I5030 Unspecified diastolic (congestive) heart failure: Secondary | ICD-10-CM | POA: Diagnosis not present

## 2022-12-21 DIAGNOSIS — I4819 Other persistent atrial fibrillation: Secondary | ICD-10-CM | POA: Diagnosis not present

## 2022-12-21 DIAGNOSIS — E669 Obesity, unspecified: Secondary | ICD-10-CM | POA: Diagnosis not present

## 2022-12-21 DIAGNOSIS — I110 Hypertensive heart disease with heart failure: Secondary | ICD-10-CM | POA: Diagnosis not present

## 2022-12-21 NOTE — Telephone Encounter (Signed)
Spoke with the pt and notified of response per Katie  Appt scheduled for 01/03/23

## 2022-12-21 NOTE — Telephone Encounter (Signed)
Pt is compliant with warfarin management and PCP apts. ?Sent in refill.  ?

## 2022-12-22 ENCOUNTER — Telehealth: Payer: Self-pay

## 2022-12-22 NOTE — Progress Notes (Signed)
Care Management & Coordination Services Pharmacy Team  Reason for Encounter: General adherence update   Contacted patient on 12/22/2022 for general disease state and medication adherence call.   Recent office visits:  11/19/22-Graham Duncan,MD(PCP)-hospital f/u,Resume Coumadin, holding Plavix at discharge,I've switched her Omeprazole to Protonix 40,decrease losartan to '50mg'$  (1/2 tablet) 10/01/22-Graham Duncan,MD(PCP)-anxiety, use trazodone as needed.  Recent consult visits:  12/15/22-Terry Maudie Mercury MD([facility])-cont topical losartan 0.8 mg/ml qid and PF bid-Status post cataract surgery in both eyes, stable  11/26/22-Sheri Hammock,NP(cardio)-f/u shortness of breath,patient stopped protonix,GI side effects,advised to start back on '20mg'$ .encouraged to take BP home readings.Will discuss PCSK9 inhibitor on return. F/u 1 month  11/12/22-Sheri Hammock,NP(cardio)-SOB-With a second episode of her doing this in the clinic today she was sent to the emergency department for further evaluation to ensure that she had no blockages in her throat preventing her from being able to swallow and breathe,f/u after evaluation at ED. 10/22/22-Sheri Hammock,NP(cardio)-f/u Afib.encouraged to keep BP log.awaiting sleep study, f/u 3 weeks hold off on pm metoprolol dose. 10/06/22-Elizabeth Walsh,NP(pulmo)-sleep consult, Advised she take pantoprazole '20mg'$  daily in the morning and start famotidine '20mg'$  at bedtime. Encourage she follow-up GERD diet and sleep with head elevate.  . Needs home sleep study,referral for ENT. 09/30/22-Sheri Hammock,NP(cardio)- f/u Afib,no medication changes f/u 3 months 09/29/22-Terry Maudie Mercury MD([facility])- recommend trial of topical losartan compound qid. 09/21/22-Sheri Hammock,NP(cardio)-SOB,We will discuss PCSK9 inhibitor therapy on her return. No medication changes f/u 2 weeks  Hospital visits:  Medication Reconciliation was completed by comparing discharge summary, patient's EMR and Pharmacy list, and  upon discussion with patient.  Admitted to the hospital on 11/12/22 due to Symptomatic Anemia . Discharge date was 11/16/22. Discharged from [facility].     Medication Changes at Hospital Discharge: -Changed pantoprazole  Medications Discontinued at Hospital Discharge: -Stopped  amiodarone  Clopidogrel  Ezetimibe  Omeprazole  Medications that remain the same after Hospital Discharge:??  -All other medications will remain the same.    12/03/22-[facility]  ED- fall-Lumbar x-ray is negative for acute process, patient is appropriate for discharge home with PCP follow-up.   Medications: Outpatient Encounter Medications as of 12/22/2022  Medication Sig   amiodarone (PACERONE) 100 MG tablet Take 1 tablet (100 mg total) by mouth daily.   diltiazem (CARDIZEM CD) 120 MG 24 hr capsule Take 1 capsule (120 mg total) by mouth daily.   furosemide (LASIX) 20 MG tablet TAKE 1 TABLET BY MOUTH 2 TIMES DAILY   gabapentin (NEURONTIN) 100 MG capsule TAKE 1 CAPSULE BY MOUTH 3 TIMES DAILY ASNEEDED   levothyroxine (SYNTHROID) 100 MCG tablet TAKE 1 TABLET BY MOUTH ONCE A DAY BEFOREBREAKFAST.   losartan (COZAAR) 100 MG tablet Held as of 12/19/22   LOSARTAN POTASSIUM PO Apply 0.8 mg to eye in the morning, at noon, in the evening, and at bedtime.   metoprolol succinate (TOPROL-XL) 50 MG 24 hr tablet Take 1 tablet (50 mg) by mouth once daily. Take with or immediately following a meal.   nitroGLYCERIN (NITROSTAT) 0.4 MG SL tablet Place 1 tablet (0.4 mg total) under the tongue every 5 (five) minutes as needed for chest pain.   pantoprazole (PROTONIX) 20 MG tablet Take 1 tablet (20 mg total) by mouth 2 (two) times daily before a meal.   Polyethyl Glycol-Propyl Glycol (SYSTANE) 0.4-0.3 % GEL ophthalmic gel Place 1 application into both eyes.   potassium chloride (KLOR-CON) 10 MEQ tablet TAKE 1 TABLET BY MOUTH ONCE A DAY   prednisoLONE acetate (PRED FORTE) 1 % ophthalmic suspension  Place 1 drop into the right eye 2 (two)  times daily.   traZODone (DESYREL) 50 MG tablet Take 0.5-1 tablets (25-50 mg total) by mouth at bedtime as needed for sleep. Hold for patient request.   warfarin (COUMADIN) 5 MG tablet TAKE 1 TABLET BY MOUTH DAILY EXCEPT TAKE 1/2 TABLET ON MONDAYS, WEDNESDAYS AND FRIDAYS OR AS DIRECTED BY ANTICOAGULATION CLINIC   No facility-administered encounter medications on file as of 12/22/2022.    Recent vitals BP Readings from Last 3 Encounters:  12/16/22 (!) 118/56  12/03/22 127/60  11/26/22 105/66   Pulse Readings from Last 3 Encounters:  12/16/22 64  12/03/22 61  11/26/22 (!) 59   Wt Readings from Last 3 Encounters:  12/03/22 226 lb (102.5 kg)  11/26/22 226 lb 3.2 oz (102.6 kg)  11/19/22 226 lb (102.5 kg)   BMI Readings from Last 3 Encounters:  12/03/22 42.70 kg/m  11/26/22 42.74 kg/m  11/19/22 44.14 kg/m    Recent lab results    Component Value Date/Time   NA 137 12/16/2022 1101   NA 140 03/25/2020 1637   K 3.8 12/16/2022 1101   CL 102 12/16/2022 1101   CO2 28 12/16/2022 1101   GLUCOSE 94 12/16/2022 1101   BUN 25 (H) 12/16/2022 1101   BUN 10 03/25/2020 1637   CREATININE 1.31 (H) 12/16/2022 1101   CREATININE 0.73 07/10/2021 1508   CALCIUM 9.4 12/16/2022 1101    Lab Results  Component Value Date   CREATININE 1.31 (H) 12/16/2022   GFR 36.46 (L) 12/16/2022   GFRNONAA 60 (L) 11/16/2022   GFRAA 90 03/25/2020   No results found for: "HGBA1C", "FRUCTOSAMINE", "MICROALBUR"  Lab Results  Component Value Date   CHOL 238 (H) 08/12/2022   HDL 54.40 08/12/2022   LDLCALC 161 (H) 08/12/2022   LDLDIRECT 152.6 12/31/2013   TRIG 112.0 08/12/2022   CHOLHDL 4 08/12/2022     What concerns do you have about your medications?patient reports she is now off losartan.There was a discussion due to also having a compound eye medication with losartan in the makeup.   The patient denies side effects with their medications.   How often do you forget or accidentally miss a dose?  Never  Do you use a pillbox? Yes  Are you having any problems getting your medications from your pharmacy? No  Has the cost of your medications been a concern? No  Since last visit with PharmD, the following interventions have been made. The patient has had improvement with stomach issues and not taking medication.  The patient has had an ED visit since last contact.   The patient denies problems with their health. Patient just saw Hematology per referral from PCP,patient has concerns of her anemia.  Patient denies concerns or questions for Charlene Brooke,  , PharmD at this time.   Counseled patient on: [place] job taking medications, Importance of taking medication daily without missed doses, Benefits of adherence packaging or a pillbox, and Access to carecoordination team for any cost, medication or pharmacy concerns.   Care Gaps: Annual wellness visit in last year? Yes   [name] Rating Drugs:  Medication:  Last Fill: Day Supply  Losartan '100mg'$  10/09/22 90  .Charlene Brooke, pharmD notified  Avel Sensor, [place]  [phone]

## 2022-12-23 ENCOUNTER — Ambulatory Visit: Payer: PPO

## 2022-12-24 ENCOUNTER — Inpatient Hospital Stay: Payer: PPO

## 2022-12-24 ENCOUNTER — Inpatient Hospital Stay: Payer: PPO | Attending: Internal Medicine | Admitting: Internal Medicine

## 2022-12-24 ENCOUNTER — Encounter: Payer: Self-pay | Admitting: Internal Medicine

## 2022-12-24 VITALS — BP 133/52 | HR 63 | Temp 98.6°F | Resp 19 | Wt 225.3 lb

## 2022-12-24 DIAGNOSIS — Z79899 Other long term (current) drug therapy: Secondary | ICD-10-CM | POA: Diagnosis not present

## 2022-12-24 DIAGNOSIS — I130 Hypertensive heart and chronic kidney disease with heart failure and stage 1 through stage 4 chronic kidney disease, or unspecified chronic kidney disease: Secondary | ICD-10-CM | POA: Diagnosis not present

## 2022-12-24 DIAGNOSIS — Z7901 Long term (current) use of anticoagulants: Secondary | ICD-10-CM | POA: Diagnosis not present

## 2022-12-24 DIAGNOSIS — D649 Anemia, unspecified: Secondary | ICD-10-CM | POA: Insufficient documentation

## 2022-12-24 DIAGNOSIS — Z955 Presence of coronary angioplasty implant and graft: Secondary | ICD-10-CM | POA: Diagnosis not present

## 2022-12-24 DIAGNOSIS — I2510 Atherosclerotic heart disease of native coronary artery without angina pectoris: Secondary | ICD-10-CM | POA: Insufficient documentation

## 2022-12-24 DIAGNOSIS — N183 Chronic kidney disease, stage 3 unspecified: Secondary | ICD-10-CM | POA: Diagnosis not present

## 2022-12-24 DIAGNOSIS — N1832 Chronic kidney disease, stage 3b: Secondary | ICD-10-CM

## 2022-12-24 DIAGNOSIS — I4891 Unspecified atrial fibrillation: Secondary | ICD-10-CM | POA: Insufficient documentation

## 2022-12-24 DIAGNOSIS — Z87891 Personal history of nicotine dependence: Secondary | ICD-10-CM | POA: Diagnosis not present

## 2022-12-24 DIAGNOSIS — I509 Heart failure, unspecified: Secondary | ICD-10-CM | POA: Diagnosis not present

## 2022-12-24 LAB — CBC WITH DIFFERENTIAL/PLATELET
Abs Immature Granulocytes: 0.13 10*3/uL — ABNORMAL HIGH (ref 0.00–0.07)
Basophils Absolute: 0.1 10*3/uL (ref 0.0–0.1)
Basophils Relative: 1 %
Eosinophils Absolute: 0.2 10*3/uL (ref 0.0–0.5)
Eosinophils Relative: 2 %
HCT: 28.5 % — ABNORMAL LOW (ref 36.0–46.0)
Hemoglobin: 8.9 g/dL — ABNORMAL LOW (ref 12.0–15.0)
Immature Granulocytes: 1 %
Lymphocytes Relative: 15 %
Lymphs Abs: 1.5 10*3/uL (ref 0.7–4.0)
MCH: 25.6 pg — ABNORMAL LOW (ref 26.0–34.0)
MCHC: 31.2 g/dL (ref 30.0–36.0)
MCV: 82.1 fL (ref 80.0–100.0)
Monocytes Absolute: 0.9 10*3/uL (ref 0.1–1.0)
Monocytes Relative: 8 %
Neutro Abs: 7.6 10*3/uL (ref 1.7–7.7)
Neutrophils Relative %: 73 %
Platelets: 332 10*3/uL (ref 150–400)
RBC: 3.47 MIL/uL — ABNORMAL LOW (ref 3.87–5.11)
RDW: 15.8 % — ABNORMAL HIGH (ref 11.5–15.5)
WBC: 10.4 10*3/uL (ref 4.0–10.5)
nRBC: 0 % (ref 0.0–0.2)

## 2022-12-24 LAB — RETICULOCYTES
Immature Retic Fract: 19.6 % — ABNORMAL HIGH (ref 2.3–15.9)
RBC.: 3.46 MIL/uL — ABNORMAL LOW (ref 3.87–5.11)
Retic Count, Absolute: 51.9 10*3/uL (ref 19.0–186.0)
Retic Ct Pct: 1.5 % (ref 0.4–3.1)

## 2022-12-24 LAB — TSH: TSH: 1.568 u[IU]/mL (ref 0.350–4.500)

## 2022-12-24 NOTE — Progress Notes (Signed)
[place]  [phone] [phone]  ID: MEILI KLECKLEY OB: 01-Jul-1932  MR#: [id number]  IRJ#:[id number]  Patient Care Team: Tonia Ghent, MD as PCP - General (Family Medicine) Rockey Situ Kathlene November, MD as PCP - Cardiology (Cardiology) Minna Merritts, MD as Consulting Physician (Cardiology) Doyce Para, MD as Consulting Physician (Ophthalmology) Estill Cotta, MD as Consulting Physician (Ophthalmology) Charlton Haws, [facility] as Pharmacist (Pharmacist)  REFERRING PROVIDER: Dr. Elsie Stain, MD  REASON FOR REFERRAL: anemia  HPI: Julie Phillips is a 89 y.o. female with past medical CHF, hyperlipidemia, hypertension, hypothyroidism, A-fib on warfarin was referred to hematology for workup of anemia.  Patient reports feeling fatigued for past 3 weeks.  She also experiences shortness of breath at rest or when she is talking too much.  Interestingly, however patient is able to walk around the home without issue.  She also reports dizziness and is concerned that she may pass out anytime.  Patient presented to ED on 12/03/2022 after she had a fall and hit her head.  CT head was unremarkable.  She was admitted beginning of December 2023 for same symptoms of shortness of breath, fatigue, melena and dizziness.  Her hemoglobin was 7.6 and she received 1 unit of PRBC transfusion.  There was also concern for GI bleed.  Had EGD and colonoscopy which did not show any bleeding.  Her Plavix was held.  She continues to be on Coumadin.  Labs were reviewed.  Patient developed mild anemia in April 2023.  Since December 2023, her anemia has been progressively worse ranging between 8-9.  WBC and platelets are normal.  MCV normal.  Iron panel from 30/1/60 was normal.  Folic acid normal.  F09 2000.   REVIEW OF SYSTEMS:   ROS  As per HPI. Otherwise, a complete review of systems is negative.  PAST MEDICAL HISTORY: Past Medical History:  Diagnosis Date    Arrhythmia    atrial fibrillation   CHF (congestive heart failure) (HCC)    Chronic heart failure with preserved ejection fraction (HFpEF) ([place])    a. 01/2022 Echo: EF 65-70%, no rwma, mild LVH, GrII DD, nl RV fxn, mildly dil LA, mild MR, ao scelrosis.   Fall    GERD (gastroesophageal reflux disease)    History of chicken pox    Hx of colonic polyps    Hyperlipidemia    Hypertension    Hypothyroidism    Keratopathy 2018   R eye   LVH (left ventricular hypertrophy)    Persistent atrial fibrillation ([place])    a. Initially dx in 2012; b. 01/2022 recurrent AF rvr noted-->warfarin added (CHA2DS2VASc = 5); c. 03/2022 Amio added-->DCCV x 1 (150J).    PAST SURGICAL HISTORY: Past Surgical History:  Procedure Laterality Date   CARDIOVASCULAR STRESS TEST  2011   normal    CARDIOVERSION N/A 04/02/2022   Procedure: CARDIOVERSION;  Surgeon: Minna Merritts, MD;  Location: [facility] ORS;  Service: Cardiovascular;  Laterality: N/A;   COLONOSCOPY WITH PROPOFOL N/A 11/16/2022   Procedure: COLONOSCOPY WITH PROPOFOL;  Surgeon: Lesly Rubenstein, MD;  Location: [facility] ENDOSCOPY;  Service: Endoscopy;  Laterality: N/A;   CORONARY STENT INTERVENTION N/A 05/06/2022   Procedure: CORONARY STENT INTERVENTION;  Surgeon: Wellington Hampshire, MD;  Location: [place] CV LAB;  Service: Cardiovascular;  Laterality: N/A;   ESOPHAGOGASTRODUODENOSCOPY (EGD) WITH PROPOFOL N/A 11/14/2022   Procedure: ESOPHAGOGASTRODUODENOSCOPY (EGD) WITH PROPOFOL;  Surgeon: Lesly Rubenstein, MD;  Location: [facility] ENDOSCOPY;  Service: Endoscopy;  Laterality: N/A;   keratopathy     LEFT HEART CATH AND CORONARY ANGIOGRAPHY N/A 05/06/2022   Procedure: LEFT HEART CATH AND CORONARY ANGIOGRAPHY;  Surgeon: Wellington Hampshire, MD;  Location: [place] CV LAB;  Service: Cardiovascular;  Laterality: N/A;    FAMILY HISTORY: Family History  Problem Relation Age of Onset   Heart disease Mother    Heart disease Father    Breast cancer Sister     Breast cancer Daughter    Colon cancer Neg Hx     HEALTH MAINTENANCE: Social History   Tobacco Use   Smoking status: Former    Packs/day: 2.00    Years: 20.00    Total pack years: 40.00    Types: Cigarettes    Quit date: 12/14/1971    Years since quitting: 51.0   Smokeless tobacco: Never  Vaping Use   Vaping Use: Never used  Substance Use Topics   Alcohol use: Not Currently   Drug use: No     Allergies  Allergen Reactions   Statins     Myalgias   Vitamin K And Related Shortness Of Breath   Wheat     Throat swelling   Jardiance [Empagliflozin] Other (See Comments)    Dry mouth, "gasping for breath"   Amlodipine     hematuria   Eucalyptus Oil    Lisinopril     Unrecalled BP med- possibly lisinopril- intolerant, had cough on the medicine- changed to cozaar   Rice Nausea And Vomiting    Current Outpatient Medications  Medication Sig Dispense Refill   amiodarone (PACERONE) 100 MG tablet Take 1 tablet (100 mg total) by mouth daily. 90 tablet 3   diltiazem (CARDIZEM CD) 120 MG 24 hr capsule Take 1 capsule (120 mg total) by mouth daily. 90 capsule 3   furosemide (LASIX) 20 MG tablet TAKE 1 TABLET BY MOUTH 2 TIMES DAILY 180 tablet 0   gabapentin (NEURONTIN) 100 MG capsule TAKE 1 CAPSULE BY MOUTH 3 TIMES DAILY ASNEEDED 270 capsule 3   levothyroxine (SYNTHROID) 100 MCG tablet TAKE 1 TABLET BY MOUTH ONCE A DAY BEFOREBREAKFAST. 90 tablet 3   losartan (COZAAR) 100 MG tablet Held as of 12/19/22     LOSARTAN POTASSIUM PO Apply 0.8 mg to eye in the morning, at noon, in the evening, and at bedtime.     metoprolol succinate (TOPROL-XL) 50 MG 24 hr tablet Take 1 tablet (50 mg) by mouth once daily. Take with or immediately following a meal. 180 tablet 0   nitroGLYCERIN (NITROSTAT) 0.4 MG SL tablet Place 1 tablet (0.4 mg total) under the tongue every 5 (five) minutes as needed for chest pain. 25 tablet 3   pantoprazole (PROTONIX) 20 MG tablet Take 1 tablet (20 mg total) by mouth 2 (two)  times daily before a meal. 180 tablet 3   Polyethyl Glycol-Propyl Glycol (SYSTANE) 0.4-0.3 % GEL ophthalmic gel Place 1 application into both eyes.     potassium chloride (KLOR-CON) 10 MEQ tablet TAKE 1 TABLET BY MOUTH ONCE A DAY 90 tablet 1   prednisoLONE acetate (PRED FORTE) 1 % ophthalmic suspension Place 1 drop into the right eye 2 (two) times daily.     traZODone (DESYREL) 50 MG tablet Take 0.5-1 tablets (25-50 mg total) by mouth at bedtime as needed for sleep. Hold for patient request. 30 tablet 1   warfarin (COUMADIN) 5 MG tablet TAKE 1 TABLET BY MOUTH DAILY EXCEPT TAKE 1/2 TABLET ON MONDAYS, WEDNESDAYS AND FRIDAYS OR AS DIRECTED  BY ANTICOAGULATION CLINIC 110 tablet 1   No current facility-administered medications for this visit.    OBJECTIVE: There were no vitals filed for this visit.   There is no height or weight on file to calculate BMI.      General: Well-developed, well-nourished, no acute distress. Eyes: Pink conjunctiva, anicteric sclera. HEENT: Normocephalic, moist mucous membranes, clear oropharnyx. Lungs: Clear to auscultation bilaterally. Heart: Regular rate and rhythm. No rubs, murmurs, or gallops. Abdomen: Soft, nontender, nondistended. No organomegaly noted, normoactive bowel sounds. Musculoskeletal: No edema, cyanosis, or clubbing. Neuro: Alert, answering all questions appropriately. Cranial nerves grossly intact. Skin: No rashes or petechiae noted. Psych: Normal affect. Lymphatics: No cervical, calvicular, axillary or inguinal LAD.   LAB RESULTS:  Lab Results  Component Value Date   NA 137 12/16/2022   K 3.8 12/16/2022   CL 102 12/16/2022   CO2 28 12/16/2022   GLUCOSE 94 12/16/2022   BUN 25 (H) 12/16/2022   CREATININE 1.31 (H) 12/16/2022   CALCIUM 9.4 12/16/2022   PROT 6.4 (L) 11/13/2022   ALBUMIN 3.2 (L) 11/13/2022   AST 18 11/13/2022   ALT 13 11/13/2022   ALKPHOS 58 11/13/2022   BILITOT 0.8 11/13/2022   GFRNONAA 60 (L) 11/16/2022   GFRAA 90  03/25/2020    Lab Results  Component Value Date   WBC 9.6 12/16/2022   NEUTROABS 7.4 12/16/2022   HGB 9.0 (L) 12/16/2022   HCT 27.6 (L) 12/16/2022   MCV 81.4 12/16/2022   PLT 398.0 12/16/2022    Lab Results  Component Value Date   TIBC 371 11/13/2022   FERRITIN 28.6 12/16/2022   IRONPCTSAT 11 11/13/2022     STUDIES: DG Lumbar Spine 2-3 Views  Result Date: 12/03/2022 CLINICAL DATA:  Fall, back pain EXAM: LUMBAR SPINE - 2-3 VIEW COMPARISON:  None Available. FINDINGS: There is no evidence of lumbar spine fracture. There is moderate-severe multilevel degenerative disc disease and lower lumbar predominant facet arthropathy. Grade 1 degenerative anterolisthesis at L4-L5. Vascular calcifications. IMPRESSION: No evidence of lumbar spine fracture. Moderate-severe multilevel degenerative disc disease and lower lumbar predominant facet arthropathy. Grade 1 degenerative anterolisthesis at L4-L5. Electronically Signed   By: Maurine Simmering M.D.   On: 12/03/2022 13:23   DG Ankle Complete Left  Result Date: 12/03/2022 CLINICAL DATA:  Fall EXAM: LEFT ANKLE COMPLETE - 3+ VIEW COMPARISON:  None Available. FINDINGS: There is no evidence of fracture, dislocation, or joint effusion. There is no evidence of arthropathy or other focal bone abnormality. Soft tissues are unremarkable. IMPRESSION: Negative. Electronically Signed   By: Rolm Baptise M.D.   On: 12/03/2022 11:54   CT Head Wo Contrast  Result Date: 12/03/2022 CLINICAL DATA:  Trauma EXAM: CT HEAD WITHOUT CONTRAST TECHNIQUE: Contiguous axial images were obtained from the base of the skull through the vertex without intravenous contrast. RADIATION DOSE REDUCTION: This exam was performed according to the departmental dose-optimization program which includes automated exposure control, adjustment of the mA and/or kV according to patient size and/or use of iterative reconstruction technique. COMPARISON:  06/06/12 CT Brain FINDINGS: Brain: No evidence of  acute infarction, hemorrhage, hydrocephalus, extra-axial collection or mass lesion/mass effect. There is sequela of moderate chronic microvascular ischemic change. Vascular: No hyperdense vessel or unexpected calcification. Skull: Normal. Negative for fracture or focal lesion. Sinuses/Orbits: Bilateral lens replacement. Paranasal sinuses and mastoid air cells are clear. Other: None IMPRESSION: No CT evidence of intracranial injury. Electronically Signed   By: Marin Roberts M.D.   On: 12/03/2022 11:40  ASSESSMENT AND PLAN:   ADELIN VENTRELLA is a 89 y.o. female with pmh of CHF, CAD s/p stent, hyperlipidemia, hypertension, hypothyroidism, A-fib on warfarin was referred to hematology for workup of anemia.  # Normocytic anemia # CKD stage III -Patient developed mild anemia in April 2023.  Since December 2023, anemia has been progressively worsening.  Her hemoglobin is between 8-9.  Iron panel, B12 and folate was normal.  EGD and colonoscopy did not show any bleeding source.  I will check myeloma panel, reticulocyte count and TSH to complete the workup.  Suspicion for hemolysis is low.   -I am suspecting that her anemia is from chronic kidney disease.  Considering, patient is symptomatic such as shortness of breath, dizziness I will consider a trial of Aranesp injection starting at 100 mcg every 3 weeks to see if her symptoms improve with correction of anemia to goal of 10-11.  Will titrate the dose of Aranesp based on the response.  Side effects such as hypertension, increased risk of cardiovascular events or thromboembolic events with hemoglobin more than 11 was discussed.  Patient was also explained that she will not experience immediate improvement in her symptoms because Aranesp needs time to correct the anemia.  -Patient also has multiple cardiac problems such as A-fib, CHF, CAD had stent.  It is also reasonable to assess for any additional cardiac workup that may be needed for her symptoms.  Patient is  planned to see Dr. Rockey Situ on 1/22.    Orders Placed This Encounter  Procedures   CBC with Differential   TSH   Reticulocytes   Multiple Myeloma Panel (SPEP&IFE w/QIG)   Kappa/lambda light chains   CC to Dr. Damita Dunnings and Dr. Rockey Situ.  Scheduled for Aranesp injection every 3 weeks x 2. RTC on 2/29 for MD visit, labs, Aranesp  Patient expressed understanding and was in agreement with this plan. She also understands that She can call clinic at any time with any questions, concerns, or complaints.   I spent a total of 45 minutes reviewing chart data, face-to-face evaluation with the patient, counseling and coordination of care as detailed above.  Jane Canary, MD   12/24/2022 9:51 AM

## 2022-12-24 NOTE — Progress Notes (Signed)
Patient has no concerns today. 

## 2022-12-27 LAB — KAPPA/LAMBDA LIGHT CHAINS
Kappa free light chain: 43.1 mg/L — ABNORMAL HIGH (ref 3.3–19.4)
Kappa, lambda light chain ratio: 1.8 — ABNORMAL HIGH (ref 0.26–1.65)
Lambda free light chains: 24 mg/L (ref 5.7–26.3)

## 2022-12-29 ENCOUNTER — Other Ambulatory Visit: Payer: Self-pay | Admitting: *Deleted

## 2022-12-29 DIAGNOSIS — D649 Anemia, unspecified: Secondary | ICD-10-CM

## 2022-12-29 LAB — MULTIPLE MYELOMA PANEL, SERUM
Albumin SerPl Elph-Mcnc: 3.6 g/dL (ref 2.9–4.4)
Albumin/Glob SerPl: 1.1 (ref 0.7–1.7)
Alpha 1: 0.2 g/dL (ref 0.0–0.4)
Alpha2 Glob SerPl Elph-Mcnc: 0.9 g/dL (ref 0.4–1.0)
B-Globulin SerPl Elph-Mcnc: 1.2 g/dL (ref 0.7–1.3)
Gamma Glob SerPl Elph-Mcnc: 1.1 g/dL (ref 0.4–1.8)
Globulin, Total: 3.4 g/dL (ref 2.2–3.9)
IgA: 346 mg/dL (ref 64–422)
IgG (Immunoglobin G), Serum: 1166 mg/dL (ref 586–1602)
IgM (Immunoglobulin M), Srm: 71 mg/dL (ref 26–217)
Total Protein ELP: 7 g/dL (ref 6.0–8.5)

## 2022-12-30 ENCOUNTER — Inpatient Hospital Stay: Payer: PPO

## 2022-12-30 ENCOUNTER — Ambulatory Visit (INDEPENDENT_AMBULATORY_CARE_PROVIDER_SITE_OTHER): Payer: PPO

## 2022-12-30 VITALS — BP 128/67 | HR 64

## 2022-12-30 DIAGNOSIS — N1832 Chronic kidney disease, stage 3b: Secondary | ICD-10-CM

## 2022-12-30 DIAGNOSIS — Z7901 Long term (current) use of anticoagulants: Secondary | ICD-10-CM

## 2022-12-30 DIAGNOSIS — D649 Anemia, unspecified: Secondary | ICD-10-CM

## 2022-12-30 LAB — HEMOGLOBIN AND HEMATOCRIT, BLOOD
HCT: 28.5 % — ABNORMAL LOW (ref 36.0–46.0)
Hemoglobin: 8.8 g/dL — ABNORMAL LOW (ref 12.0–15.0)

## 2022-12-30 LAB — POCT INR: INR: 1.6 — AB (ref 2.0–3.0)

## 2022-12-30 MED ORDER — DARBEPOETIN ALFA 100 MCG/0.5ML IJ SOSY
100.0000 ug | PREFILLED_SYRINGE | Freq: Once | INTRAMUSCULAR | Status: AC
  Administered 2022-12-30: 100 ug via SUBCUTANEOUS
  Filled 2022-12-30: qty 0.5

## 2022-12-30 NOTE — Progress Notes (Signed)
Pt reports she is not sure if she missed a dose or two. She is also eating more greens. Educated pt on importance of taking daily medication and on acceptable amounts of greens. Pt will also be starting Aranesp injections today; no interaction with warfarin.  Increase dose today to take 1 1/2 tables and increase dose tomorrow to take 1 tablet and then continue 1 tablet daily except take 1/2 tablet on Mondays, Wednesdays and Fridays. Recheck in 2 weeks.

## 2022-12-30 NOTE — Patient Instructions (Addendum)
Pre visit review using our clinic review tool, if applicable. No additional management support is needed unless otherwise documented below in the visit note.  Increase dose today to take 1 1/2 tables and increase dose tomorrow to take 1 tablet and then continue 1 tablet daily except take 1/2 tablet on Mondays, Wednesdays and Fridays. Recheck in 2 weeks.

## 2023-01-02 NOTE — Progress Notes (Signed)
Patient ID: Julie Phillips, female   DOB: November 21, 1932, 89 y.o.   MRN: [id number] Cardiology Office Note  Date:  01/03/2023   ID:  Julie Phillips, Julie Phillips 05-10-1932, MRN [id number]  PCP:  Julie Ghent, MD   Chief Complaint  Patient presents with   3 month follow up     Patient c/o shortness of breath and occasional chest pressure. Medications reviewed by the patient verbally.     HPI:  Julie Phillips  is a pleasant 89 year-old woman with remote history of  CAD, stent placed to mid LAD May 2023 Former smoker atrial fibrillation in 2007, not on anticoagulation (no recurrence in 13 yrs) previously on warfarin, stopped, was falling alot In [place] in setting of hypokalemia (did stress test/pharmacological study) low potassium by her report,  hypertension  Ejection fraction 65 to 70% in 3/23 who presents for routine followup of her atrial fibrillation, CAD  LOV with myself 7/23  Fall dec 12th 2023 , after doctor visit, seen in the ER Imaging studies negative Sprain ankle, has recovered  On warfarin, followed by PMD  INR labile, 5.0 to 1.6, sometimes eating too many salads and broccoli Can't afford NOAC  Lab work reviewed HGB 9.0 Had injection , "epo" Had some ache in chest after shot Some shortness of breath on exertion, while at [employer] had to stop once or twice to recover  Not on losartan pill, as she is on losartan eye drops She is going to discuss this with Dr. Maudie Mercury  Denies tachypalpitations concerning for atrial fibrillation  Not working with ventricular health, preferred in office visits Prefers no medication changes at this time as she feels well Denies significant abdominal swelling, or ankle swelling  EKG personally reviewed by myself on todays visit NSR rate 76 bpm, nonspecific ST ABN  Other past medical history reviewed Side effects on jardiance: SOB, dizziness  Does not want NOAC, prefers to stay on warfarin  hospital April 2023 for fatigue,  shortness of breath, orthopnea, leg swelling Noted to be in atrial fibrillation with RVR with CHF exacerbation Cardioversion April 02, 2022 on amiodarone load at that time  Seen by one of our providers: 04/09/22: Did not feel well at that time Amiodarone down to 200 mg daily Cardiac CTA ordered  for chest pain  Seen by CHF clinic,  Ventricle heath: Side effects on jardiance and spironolactone, side effects of cough, dizziness  Cardiac CTA: 04/22/22 results reviewed with her in detail 1. Left Main:  No significant stenosis. 2. LAD: significant stenosis in the mid to distal LAD.  FFRct 0.70 3. LCX: significant proximal stenosis. FFR ct showed CTO of proximal LCx 4. RCA: No significant stenosis.  FFRct 0.82   1. CT FFR analysis showed significant stenosis in the mid LAD and proximal LCx.  Atrial fibrillation history as detailed below cardioversion April 02, 2022 for atrial fibrillation 2 epsiodes of afib dec 2022 and feb 2023, lasting 24 hours  In the hospital 2/23: afib with RVR Converted to normal sinus rhythm in the hospital on diltiazem infusion  Echocardiogram done in the hospital normal ejection fraction, February 10, 2022 Mildly dilated left atrium mild MR  She has tried statins in the past and had a problem with her "urine". Details are uncertain   PMH:   has a past medical history of Arrhythmia, CHF (congestive heart failure) ([place]), Chronic heart failure with preserved ejection fraction (HFpEF) ([place]), Fall, GERD (gastroesophageal reflux disease), History of chicken pox, colonic polyps, Hyperlipidemia,  Hypertension, Hypothyroidism, Keratopathy (2018), LVH (left ventricular hypertrophy), and Persistent atrial fibrillation ([place]).  PSH:    Past Surgical History:  Procedure Laterality Date   CARDIOVASCULAR STRESS TEST  2011   normal    CARDIOVERSION N/A 04/02/2022   Procedure: CARDIOVERSION;  Surgeon: Minna Merritts, MD;  Location: [facility] ORS;  Service: Cardiovascular;   Laterality: N/A;   COLONOSCOPY WITH PROPOFOL N/A 11/16/2022   Procedure: COLONOSCOPY WITH PROPOFOL;  Surgeon: Lesly Rubenstein, MD;  Location: [facility] ENDOSCOPY;  Service: Endoscopy;  Laterality: N/A;   CORONARY STENT INTERVENTION N/A 05/06/2022   Procedure: CORONARY STENT INTERVENTION;  Surgeon: Wellington Hampshire, MD;  Location: [place] [facility] LAB;  Service: Cardiovascular;  Laterality: N/A;   ESOPHAGOGASTRODUODENOSCOPY (EGD) WITH PROPOFOL N/A 11/14/2022   Procedure: ESOPHAGOGASTRODUODENOSCOPY (EGD) WITH PROPOFOL;  Surgeon: Lesly Rubenstein, MD;  Location: [facility] END[facility];  Service: Endoscopy;  Laterality: N/A;   keratopathy     LEFT HEART CATH AND CORONARY ANGIOGRAPHY N/A 05/06/2022   Procedure: LEFT HEART CATH AND CORONARY ANGIOGRAPHY;  Surgeon: Wellington Hampshire, MD;  Location: [place] [facility] LAB;  Service: Cardiovascular;  Laterality: N/A;    Current Outpatient Medications  Medication Sig Dispense Refill   amiodarone (PACERONE) 100 MG tablet Take 1 tablet (100 mg total) by mouth daily. 90 tablet 3   diltiazem (CARDIZEM CD) 120 MG 24 hr capsule Take 1 capsule (120 mg total) by mouth daily. 90 capsule 3   furosemide (LASIX) 20 MG tablet TAKE 1 TABLET BY MOUTH 2 TIMES DAILY 180 tablet 0   gabapentin (NEURONTIN) 100 MG capsule TAKE 1 CAPSULE BY MOUTH 3 TIMES DAILY ASNEEDED 270 capsule 3   levothyroxine (SYNTHROID) 100 MCG tablet TAKE 1 TABLET BY MOUTH ONCE A DAY BEFOREBREAKFAST. 90 tablet 3   LOSARTAN POTASSIUM PO Apply 0.8 mg to eye in the morning, at noon, in the evening, and at bedtime.     metoprolol succinate (TOPROL-XL) 50 MG 24 hr tablet Take 1 tablet (50 mg) by mouth once daily. Take with or immediately following a meal. 180 tablet 0   nitroGLYCERIN (NITROSTAT) 0.4 MG SL tablet Place 1 tablet (0.4 mg total) under the tongue every 5 (five) minutes as needed for chest pain. 25 tablet 3   pantoprazole (PROTONIX) 20 MG tablet Take 1 tablet (20 mg total) by mouth 2 (two) times daily  before a meal. 180 tablet 3   Polyethyl Glycol-Propyl Glycol (SYSTANE) 0.4-0.3 % GEL ophthalmic gel Place 1 application into both eyes.     potassium chloride (KLOR-CON) 10 MEQ tablet TAKE 1 TABLET BY MOUTH ONCE A DAY 90 tablet 1   traZODone (DESYREL) 50 MG tablet Take 0.5-1 tablets (25-50 mg total) by mouth at bedtime as needed for sleep. Hold for patient request. 30 tablet 1   warfarin (COUMADIN) 5 MG tablet TAKE 1 TABLET BY MOUTH DAILY EXCEPT TAKE 1/2 TABLET ON MONDAYS, WEDNESDAYS AND FRIDAYS OR AS DIRECTED BY ANTICOAGULATION CLINIC 110 tablet 1   losartan (COZAAR) 100 MG tablet Held as of 12/19/22 (Patient not taking: Reported on 12/24/2022)     prednisoLONE acetate (PRED FORTE) 1 % ophthalmic suspension Place 1 drop into the right eye 2 (two) times daily. (Patient not taking: Reported on 01/03/2023)     No current facility-administered medications for this visit.   Allergies:   Statins, Vitamin k and related, Wheat, Jardiance [empagliflozin], Amlodipine, Eucalyptus oil, Lisinopril, and Rice   Social History:  The patient  reports that she quit smoking about 51 years ago.  Her smoking use included cigarettes. She has a 40.00 pack-year smoking history. She has never used smokeless tobacco. She reports that she does not currently use alcohol. She reports that she does not use drugs.   Family History:   family history includes Breast cancer in her daughter and sister; Heart disease in her father and mother.   Review of Systems  Constitutional: Negative.   HENT: Negative.    Respiratory: Negative.    Cardiovascular: Negative.   Gastrointestinal: Negative.   Musculoskeletal: Negative.        Leg weakness, poor balance  Neurological: Negative.   Psychiatric/Behavioral: Negative.    All other systems reviewed and are negative.  PHYSICAL EXAM: VS:  BP 120/70 (BP Location: Left Arm, Patient Position: Sitting, Cuff Size: Large)   Pulse 76   Ht '5\' 1"'$  (1.549 m)   Wt 223 lb 8 oz (101.4 kg)    SpO2 97%   BMI 42.23 kg/m  , BMI Body mass index is 42.23 kg/m. Constitutional:  oriented to person, place, and time. No distress.  In a wheelchair HENT:  Head: Grossly normal Eyes:  no discharge. No scleral icterus.  Neck: No JVD, no carotid bruits  Cardiovascular: Regular rate and rhythm, no murmurs appreciated Pulmonary/Chest: Clear to auscultation bilaterally, no wheezes or rails Abdominal: Soft.  no distension.  no tenderness.  Musculoskeletal: Normal range of motion Neurological:  normal muscle tone. Coordination normal. No atrophy Skin: Skin warm and dry Psychiatric: normal affect, pleasant  Recent Labs: 11/12/2022: B Natriuretic Peptide 180.1 11/13/2022: ALT 13 11/16/2022: Magnesium 2.1 12/16/2022: BUN 25; Creatinine, Ser 1.31; Potassium 3.8; Sodium 137 12/24/2022: Platelets 332; TSH 1.568 12/30/2022: Hemoglobin 8.8    Lipid Panel Lab Results  Component Value Date   CHOL 238 (H) 08/12/2022   HDL 54.40 08/12/2022   LDLCALC 161 (H) 08/12/2022   TRIG 112.0 08/12/2022      Wt Readings from Last 3 Encounters:  01/03/23 223 lb 8 oz (101.4 kg)  12/24/22 225 lb 4.8 oz (102.2 kg)  12/03/22 226 lb (102.5 kg)     ASSESSMENT AND PLAN:  Paroxysmal atrial fibrillation ([place]) -  Prior hospitalization for atrial fibrillation with RVR, underwent cardioversion, Maintaining normal sinus rhythm on amiodarone, diltiazem, metoprolol succinate daily On warfarin, does not want to NOAC  HLD (hyperlipidemia) Does not want a statin,  myalgias among other side effects cholesterol around 200 Not on Zetia, not on PCSK9 inhibitor  Coronary artery disease with unstable angina Multivessel disease noted on cardiac CTA Stent placed to LAD Statin intolerance Reports not taking Zetia, PCSK9 previously discussed with her, she is concerned about cost  Essential hypertension Blood pressure is well controlled on today's visit. No changes made to the medications.  Morbid obesity Low  carbohydrate diet recommended Mobility limited  Leg swelling No significant swelling on today's visit  GERD Better on omeprazole   Total encounter time more than 30 minutes  Greater than 50% was spent in counseling and coordination of care with the patient   No orders of the defined types were placed in this encounter.    Signed, Esmond Plants, M.D., Ph.D. 01/03/2023  [place], [place]

## 2023-01-03 ENCOUNTER — Encounter: Payer: Self-pay | Admitting: Cardiovascular Disease

## 2023-01-03 ENCOUNTER — Ambulatory Visit: Payer: PPO | Attending: Cardiovascular Disease | Admitting: Cardiovascular Disease

## 2023-01-03 ENCOUNTER — Ambulatory Visit: Payer: PPO | Admitting: Primary Care

## 2023-01-03 VITALS — BP 120/70 | HR 76 | Ht 61.0 in | Wt 223.5 lb

## 2023-01-03 DIAGNOSIS — I480 Paroxysmal atrial fibrillation: Secondary | ICD-10-CM

## 2023-01-03 DIAGNOSIS — I25118 Atherosclerotic heart disease of native coronary artery with other forms of angina pectoris: Secondary | ICD-10-CM | POA: Diagnosis not present

## 2023-01-03 DIAGNOSIS — D649 Anemia, unspecified: Secondary | ICD-10-CM | POA: Diagnosis not present

## 2023-01-03 DIAGNOSIS — I4819 Other persistent atrial fibrillation: Secondary | ICD-10-CM

## 2023-01-03 DIAGNOSIS — I5032 Chronic diastolic (congestive) heart failure: Secondary | ICD-10-CM

## 2023-01-03 DIAGNOSIS — E782 Mixed hyperlipidemia: Secondary | ICD-10-CM | POA: Diagnosis not present

## 2023-01-03 DIAGNOSIS — I10 Essential (primary) hypertension: Secondary | ICD-10-CM

## 2023-01-03 MED ORDER — FUROSEMIDE 20 MG PO TABS: 20.0000 mg | ORAL_TABLET | Freq: Two times a day (BID) | ORAL | 3 refills | Status: DC

## 2023-01-03 NOTE — Patient Instructions (Signed)
Medication Instructions:  ?No changes ? ?If you need a refill on your cardiac medications before your next appointment, please call your pharmacy.  ? ?Lab work: ?No new labs needed ? ?Testing/Procedures: ?No new testing needed ? ?Follow-Up: ?At [facility], you and your health needs are our priority.  As part of our continuing mission to provide you with exceptional heart care, we have created designated Provider Care Teams.  These Care Teams include your primary Cardiologist (physician) and Advanced Practice Providers (APPs -  Physician Assistants and Nurse Practitioners) who all work together to provide you with the care you need, when you need it. ? ?You will need a follow up appointment in 6 months ? ?Providers on your designated Care Team:   ?Christopher Berge, NP ?Ryan Dunn, PA-C ?Cadence Furth, PA-C ? ?COVID-19 Vaccine Information can be found at: [email or web address] For questions related to vaccine distribution or appointments, please email [email or web address] or call [phone].  ? ?

## 2023-01-13 ENCOUNTER — Ambulatory Visit (INDEPENDENT_AMBULATORY_CARE_PROVIDER_SITE_OTHER): Payer: PPO

## 2023-01-13 DIAGNOSIS — Z7901 Long term (current) use of anticoagulants: Secondary | ICD-10-CM | POA: Diagnosis not present

## 2023-01-13 LAB — POCT INR: INR: 1.8 — AB (ref 2.0–3.0)

## 2023-01-13 NOTE — Progress Notes (Signed)
Increase dose today to take 1 1/2 tables and then change weekly dose to take 1 tablet daily except take 1/2 tablet on Mondays and Fridays. Recheck in 3 weeks.

## 2023-01-13 NOTE — Patient Instructions (Addendum)
Pre visit review using our clinic review tool, if applicable. No additional management support is needed unless otherwise documented below in the visit note.  Increase dose today to take 1 1/2 tables and then change weekly dose to take 1 tablet daily except take 1/2 tablet on Mondays and Fridays. Recheck in 3 weeks.

## 2023-01-14 ENCOUNTER — Ambulatory Visit: Payer: PPO

## 2023-01-14 ENCOUNTER — Other Ambulatory Visit: Payer: Self-pay | Admitting: Cardiology

## 2023-01-14 ENCOUNTER — Other Ambulatory Visit: Payer: Self-pay | Admitting: Family Medicine

## 2023-01-14 DIAGNOSIS — E782 Mixed hyperlipidemia: Secondary | ICD-10-CM | POA: Diagnosis not present

## 2023-01-14 DIAGNOSIS — I5030 Unspecified diastolic (congestive) heart failure: Secondary | ICD-10-CM | POA: Diagnosis not present

## 2023-01-14 DIAGNOSIS — K219 Gastro-esophageal reflux disease without esophagitis: Secondary | ICD-10-CM | POA: Diagnosis not present

## 2023-01-14 DIAGNOSIS — Z7901 Long term (current) use of anticoagulants: Secondary | ICD-10-CM | POA: Diagnosis not present

## 2023-01-14 DIAGNOSIS — I110 Hypertensive heart disease with heart failure: Secondary | ICD-10-CM | POA: Diagnosis not present

## 2023-01-14 DIAGNOSIS — I25119 Atherosclerotic heart disease of native coronary artery with unspecified angina pectoris: Secondary | ICD-10-CM | POA: Diagnosis not present

## 2023-01-14 DIAGNOSIS — I4819 Other persistent atrial fibrillation: Secondary | ICD-10-CM | POA: Diagnosis not present

## 2023-01-14 DIAGNOSIS — D649 Anemia, unspecified: Secondary | ICD-10-CM | POA: Diagnosis not present

## 2023-01-14 DIAGNOSIS — E669 Obesity, unspecified: Secondary | ICD-10-CM | POA: Diagnosis not present

## 2023-01-14 DIAGNOSIS — E039 Hypothyroidism, unspecified: Secondary | ICD-10-CM | POA: Diagnosis not present

## 2023-01-17 ENCOUNTER — Other Ambulatory Visit: Payer: Self-pay | Admitting: Family Medicine

## 2023-01-20 ENCOUNTER — Inpatient Hospital Stay: Payer: PPO

## 2023-01-20 ENCOUNTER — Inpatient Hospital Stay: Payer: PPO | Attending: Internal Medicine

## 2023-01-20 ENCOUNTER — Ambulatory Visit: Payer: PPO

## 2023-01-20 VITALS — BP 112/48

## 2023-01-20 DIAGNOSIS — N183 Chronic kidney disease, stage 3 unspecified: Secondary | ICD-10-CM | POA: Insufficient documentation

## 2023-01-20 DIAGNOSIS — D649 Anemia, unspecified: Secondary | ICD-10-CM

## 2023-01-20 DIAGNOSIS — N1832 Chronic kidney disease, stage 3b: Secondary | ICD-10-CM

## 2023-01-20 LAB — HEMOGLOBIN AND HEMATOCRIT, BLOOD
HCT: 28.3 % — ABNORMAL LOW (ref 36.0–46.0)
Hemoglobin: 8.5 g/dL — ABNORMAL LOW (ref 12.0–15.0)

## 2023-01-20 MED ORDER — DARBEPOETIN ALFA 100 MCG/0.5ML IJ SOSY
100.0000 ug | PREFILLED_SYRINGE | Freq: Once | INTRAMUSCULAR | Status: AC
  Administered 2023-01-20: 100 ug via SUBCUTANEOUS
  Filled 2023-01-20: qty 0.5

## 2023-01-28 ENCOUNTER — Encounter: Payer: Self-pay | Admitting: Primary Care

## 2023-01-28 ENCOUNTER — Telehealth: Payer: Self-pay | Admitting: Primary Care

## 2023-01-28 ENCOUNTER — Ambulatory Visit (INDEPENDENT_AMBULATORY_CARE_PROVIDER_SITE_OTHER): Payer: PPO | Admitting: Primary Care

## 2023-01-28 VITALS — BP 132/60 | HR 74 | Temp 98.0°F | Ht 61.0 in | Wt 222.6 lb

## 2023-01-28 DIAGNOSIS — G4734 Idiopathic sleep related nonobstructive alveolar hypoventilation: Secondary | ICD-10-CM

## 2023-01-28 DIAGNOSIS — G4730 Sleep apnea, unspecified: Secondary | ICD-10-CM | POA: Diagnosis not present

## 2023-01-28 NOTE — Telephone Encounter (Signed)
Beth you saw Julie Phillips in the office today and order an ONO on [employer].  The patient already has DX of OSA with AHI 25.9 and the DME will not give her 02 because she has OSA

## 2023-01-28 NOTE — Patient Instructions (Addendum)
Sleep study showed moderate sleep apnea, you have on average 26 apneas an hour. The lowest your oxygen went was 65%, average 91%  I understand your hesitation to wear CPAP, I am going to try and qualify you for nocturnal oxygen to use at night  Continue to work on weight loss, goal to lose another 10-15 bs Focus on side sleeping position at all times  Follow-up 6 months with [facility] NP or sooner if needed

## 2023-01-28 NOTE — Progress Notes (Unsigned)
$@Patientg$  ID: Julie Phillips, female    DOB: 03-17-1932, 89 y.o.   MRN: [id number]  Chief Complaint  Patient presents with   Follow-up    Discuss HST-     Referring provider: Tonia Ghent, MD  HPI: 89 year old female, former smoker. PMH significant for GERD, Afib, CAD, HTN, LVH, osteopenia, vitamin D deficiency.  Previous [facility] pulmonary encounter:  10/06/2022 Patient presents today for sleep consult. Referred by cardiology d/t afib. She has symptoms of waking up suddenly at night gasping for air and hyperventilating. She is unsure if she snores. Her sleep is very restless. She goes to bed between 11pm-1am. She has no difficulty falling asleep but has trouble staying asleep. She wakes up 2-4 times a night. She starts her day between 6:30-7:30am. No previous sleep studies. Denies symptoms of narcolepsy, cataplexy or sleep walking.   She has no trouble breathing during the day, shortness of breath and wheezing symptoms only happens at night. CTA was negative for PE. Furosemide was recently increased by cardiology. Lower extremity swelling has improved.   Sleep questionnaire Symptoms-  Struggling to breath at night, waking up gasping for air, restless sleep, daytime sleepiness  Prior sleep study- None  Bedtime-11pm-1am Time to fall asleep- 15 mins Nocturnal awakenings- 2-4 times  Out of bed/start of day-  6:30am  Weight changes- down 10 lbs Do you operate heavy machinery- no Do you currently wear CPAP- no Do you current wear oxygen- no [facility]- 9  01/28/2023- Interim hx  Patient presents today for home sleep study. She has symptoms of HST on 12/02/22 showed evidence of moderate OSA, AHI 25.9/hour. She has lost 10 lbs since her sleep study. She is sleeping a lot better recently. She is no longer waking up gasping for air. She is not as tired during the daytime. She is gets up every 2 hours to use the restroom. She is very claustrophobic. She does not feel she would be able to tolerate  CPAP and is very resistance to this.    Allergies  Allergen Reactions   Statins     Myalgias   Vitamin K And Related Shortness Of Breath   Wheat     Throat swelling   Jardiance [Empagliflozin] Other (See Comments)    Dry mouth, "gasping for breath"   Amlodipine     hematuria   Eucalyptus Oil    Lisinopril     Unrecalled BP med- possibly lisinopril- intolerant, had cough on the medicine- changed to cozaar   Rice Nausea And Vomiting    Immunization History  Administered Date(s) Administered   Fluad Quad(high Dose 65+) 09/11/2019, 11/09/2019, 10/01/2022   Influenza Split 10/04/2012   Influenza, High Dose Seasonal PF 10/19/2021   Influenza,inj,Quad PF,6+ Mos 10/08/2013, 09/25/2015, 09/14/2016, 10/06/2017   Influenza-Unspecified 09/13/2011, 08/13/2014, 10/13/2018   PFIZER(Purple Top)SARS-COV-2 Vaccination 01/03/2020, 01/21/2020, 09/11/2020, 04/18/2021   Pneumococcal Conjugate-13 08/13/2014   Pneumococcal Polysaccharide-23 12/13/2010   Td 12/13/2010    Past Medical History:  Diagnosis Date   Arrhythmia    atrial fibrillation   CHF (congestive heart failure) ([place])    Chronic heart failure with preserved ejection fraction (HFpEF) ([place])    a. 01/2022 Echo: EF 65-70%, no rwma, mild LVH, GrII DD, nl RV fxn, mildly dil LA, mild MR, ao scelrosis.   Fall    GERD (gastroesophageal reflux disease)    History of chicken pox    Hx of colonic polyps    Hyperlipidemia    Hypertension    Hypothyroidism  Keratopathy 2018   R eye   LVH (left ventricular hypertrophy)    Persistent atrial fibrillation (HCC)    a. Initially dx in 2012; b. 01/2022 recurrent AF rvr noted-->warfarin added (CHA2DS2VASc = 5); c. 03/2022 Amio added-->DCCV x 1 (150J).    Tobacco History: Social History   Tobacco Use  Smoking Status Former   Packs/day: 2.00   Years: 20.00   Total pack years: 40.00   Types: Cigarettes   Quit date: 12/14/1971   Years since quitting: 51.1  Smokeless Tobacco Never    Counseling given: Not Answered   Outpatient Medications Prior to Visit  Medication Sig Dispense Refill   amiodarone (PACERONE) 100 MG tablet Take 1 tablet (100 mg total) by mouth daily. 90 tablet 3   diltiazem (CARDIZEM CD) 120 MG 24 hr capsule Take 1 capsule (120 mg total) by mouth daily. 90 capsule 3   furosemide (LASIX) 20 MG tablet Take 1 tablet (20 mg total) by mouth 2 (two) times daily. 180 tablet 3   gabapentin (NEURONTIN) 100 MG capsule TAKE 1 CAPSULE BY MOUTH 3 TIMES DAILY ASNEEDED 270 capsule 3   levothyroxine (SYNTHROID) 100 MCG tablet TAKE 1 TABLET BY MOUTH ONCE A DAY BEFOREBREAKFAST. 90 tablet 3   losartan (COZAAR) 100 MG tablet Held as of 12/19/22     LOSARTAN POTASSIUM PO Apply 0.8 mg to eye in the morning, at noon, in the evening, and at bedtime.     metoprolol succinate (TOPROL-XL) 50 MG 24 hr tablet Take 1 tablet (50 mg) by mouth once daily. Take with or immediately following a meal. 180 tablet 0   nitroGLYCERIN (NITROSTAT) 0.4 MG SL tablet Place 1 tablet (0.4 mg total) under the tongue every 5 (five) minutes as needed for chest pain. 25 tablet 3   pantoprazole (PROTONIX) 20 MG tablet Take 1 tablet (20 mg total) by mouth 2 (two) times daily before a meal. 180 tablet 3   Polyethyl Glycol-Propyl Glycol (SYSTANE) 0.4-0.3 % GEL ophthalmic gel Place 1 application into both eyes.     potassium chloride (KLOR-CON) 10 MEQ tablet TAKE 1 TABLET BY MOUTH ONCE A DAY 90 tablet 1   traZODone (DESYREL) 50 MG tablet TAKE 1/2 TO 1 TABLET BY MOUTH AT BEDTIMEAS NEEDED FOR SLEEP. 30 tablet 1   warfarin (COUMADIN) 5 MG tablet TAKE 1 TABLET BY MOUTH DAILY EXCEPT TAKE 1/2 TABLET ON MONDAYS, WEDNESDAYS AND FRIDAYS OR AS DIRECTED BY ANTICOAGULATION CLINIC 110 tablet 1   prednisoLONE acetate (PRED FORTE) 1 % ophthalmic suspension Place 1 drop into the right eye 2 (two) times daily. (Patient not taking: Reported on 01/03/2023)     No facility-administered medications prior to visit.   Review of  Systems  Review of Systems  Constitutional: Negative.  Negative for fatigue.  HENT: Negative.    Respiratory: Negative.  Negative for apnea.   Psychiatric/Behavioral:  Negative for sleep disturbance.     Physical Exam  BP 132/60 (BP Location: Left Arm, Cuff Size: Large)   Pulse 74   Temp 98 F (36.7 C) (Temporal)   Ht 5' 1"$  (1.549 m)   Wt 222 lb 9.6 oz (101 kg)   SpO2 100%   BMI 42.06 kg/m  Physical Exam Constitutional:      Appearance: Normal appearance.  HENT:     Head: Normocephalic and atraumatic.  Cardiovascular:     Rate and Rhythm: Normal rate and regular rhythm.  Pulmonary:     Effort: Pulmonary effort is normal.  Breath sounds: Normal breath sounds.  Musculoskeletal:        General: Normal range of motion.  Skin:    General: Skin is warm and dry.  Neurological:     General: No focal deficit present.     Mental Status: She is alert and oriented to person, place, and time. Mental status is at baseline.  Psychiatric:        Mood and Affect: Mood normal.        Behavior: Behavior normal.        Thought Content: Thought content normal.        Judgment: Judgment normal.      Lab Results:  CBC    Component Value Date/Time   WBC 10.4 12/24/2022 1149   RBC 3.46 (L) 12/24/2022 1149   RBC 3.47 (L) 12/24/2022 1149   HGB 8.5 (L) 01/20/2023 1240   HGB 12.5 03/25/2020 1637   HCT 28.3 (L) 01/20/2023 1240   HCT 37.4 03/25/2020 1637   PLT 332 12/24/2022 1149   PLT 312 03/25/2020 1637   MCV 82.1 12/24/2022 1149   MCV 87 03/25/2020 1637   MCH 25.6 (L) 12/24/2022 1149   MCHC 31.2 12/24/2022 1149   RDW 15.8 (H) 12/24/2022 1149   RDW 13.4 03/25/2020 1637   LYMPHSABS 1.5 12/24/2022 1149   MONOABS 0.9 12/24/2022 1149   EOSABS 0.2 12/24/2022 1149   BASOSABS 0.1 12/24/2022 1149    BMET    Component Value Date/Time   NA 137 12/16/2022 1101   NA 140 03/25/2020 1637   K 3.8 12/16/2022 1101   CL 102 12/16/2022 1101   CO2 28 12/16/2022 1101   GLUCOSE 94  12/16/2022 1101   BUN 25 (H) 12/16/2022 1101   BUN 10 03/25/2020 1637   CREATININE 1.31 (H) 12/16/2022 1101   CREATININE 0.73 07/10/2021 1508   CALCIUM 9.4 12/16/2022 1101   GFRNONAA 60 (L) 11/16/2022 0326   GFRAA 90 03/25/2020 1637    BNP    Component Value Date/Time   BNP 180.1 (H) 11/12/2022 1548    ProBNP No results found for: "PROBNP"  Imaging: No results found.   Assessment & Plan:   Sleep apnea - Sleep study showed moderate sleep apnea, average 26 apneas an hour with SpO2 Phillips 65%, average 91%. Reviewed risks of untreated sleep apnea and treatment options. She is very hesitant to wear CPAP d/t claustrophobia. Not candidate for oral appliance or surgery. Advised she continue to work on weight loss, goal to lose another 10-15 bs. Focus on side sleeping position at all times. Follow-up 6 months with Beth NP or sooner if needed   Nocturnal hypoxia - Patient has hx afib, CHF and OSA. She is unable to wear CPAP. Ordering overnight oximetry test to qualify patient for nocturnal oxygen    Martyn Ehrich, NP 01/31/2023

## 2023-01-31 DIAGNOSIS — G4730 Sleep apnea, unspecified: Secondary | ICD-10-CM | POA: Insufficient documentation

## 2023-01-31 DIAGNOSIS — G4734 Idiopathic sleep related nonobstructive alveolar hypoventilation: Secondary | ICD-10-CM | POA: Insufficient documentation

## 2023-01-31 NOTE — Telephone Encounter (Signed)
I have sent the ONO order to [employer] we will wait and see what they tell us

## 2023-01-31 NOTE — Assessment & Plan Note (Addendum)
-   HST on 12/02/22 showed evidence of moderate OSA, AHI 25.9/hour with SpO2 low 65% (average 91%). Reviewed risks of untreated sleep apnea and treatment options. She is very hesitant to wear CPAP d/t claustrophobia. Not candidate for oral appliance or surgery. Advised she continue to work on weight loss, goal to lose another 10-15 bs. Focus on side sleeping position at all times. Follow-up 6 months with [facility] NP or sooner if needed

## 2023-01-31 NOTE — Assessment & Plan Note (Addendum)
-   Patient has hx afib, CHF and OSA. She is unable to wear CPAP. Ordering overnight oximetry test to qualify patient for nocturnal oxygen

## 2023-01-31 NOTE — Telephone Encounter (Signed)
She can not use CPAP she is claustrophobia. She has heart failure. Needs nocturnal oxygen.

## 2023-02-03 ENCOUNTER — Ambulatory Visit (INDEPENDENT_AMBULATORY_CARE_PROVIDER_SITE_OTHER): Payer: PPO

## 2023-02-03 VITALS — BP 104/56 | HR 60

## 2023-02-03 DIAGNOSIS — Z7901 Long term (current) use of anticoagulants: Secondary | ICD-10-CM

## 2023-02-03 LAB — POCT INR: INR: 1.9 — AB (ref 2.0–3.0)

## 2023-02-03 NOTE — Progress Notes (Signed)
Increase dose today to take 1 1/2 tables and then change weekly dose to take 1 tablet daily except take 1/2 tablet on Fridays. Recheck in 3 weeks.   Pt requested vitals taken today because her BP cuff at home read 153/63.  Vitals today; BP 104/56, HR 60, O2 sat 97%  Advised pt to change batteries in home cuff and if she has another reading that high to contact the office. Pt denies any CP or any other symptoms. Pt verbalized understanding.

## 2023-02-03 NOTE — Patient Instructions (Addendum)
Pre visit review using our clinic review tool, if applicable. No additional management support is needed unless otherwise documented below in the visit note.  Increase dose today to take 1 1/2 tables and then change weekly dose to take 1 tablet daily except take 1/2 tablet on Fridays. Recheck in 3 weeks.

## 2023-02-08 ENCOUNTER — Other Ambulatory Visit: Payer: Self-pay | Admitting: Family Medicine

## 2023-02-08 ENCOUNTER — Telehealth: Payer: Self-pay | Admitting: Family Medicine

## 2023-02-08 MED ORDER — METOPROLOL SUCCINATE ER 50 MG PO TB24: ORAL_TABLET | ORAL | 1 refills | Status: DC

## 2023-02-08 NOTE — Telephone Encounter (Signed)
Rx has been called in to [employer] [facility].

## 2023-02-08 NOTE — Telephone Encounter (Signed)
Prescription Request  02/08/2023  Is this a "Controlled Substance" medicine? No  LOV: 11/19/2022  What is the name of the medication or equipment?  metoprolol succinate (TOPROL-XL) 50 MG 24 hr tablet     Have you contacted your pharmacy to request a refill? No   Which pharmacy would you like this sent to?  [facility], [address] [address] Phone: [phone] Fax: [phone]    Patient notified that their request is being sent to the clinical staff for review and that they should receive a response within 2 business days.   Please advise at Mobile [phone] (mobile)

## 2023-02-11 ENCOUNTER — Inpatient Hospital Stay: Payer: PPO | Attending: Internal Medicine

## 2023-02-11 ENCOUNTER — Inpatient Hospital Stay (HOSPITAL_BASED_OUTPATIENT_CLINIC_OR_DEPARTMENT_OTHER): Payer: PPO | Admitting: Internal Medicine

## 2023-02-11 ENCOUNTER — Encounter: Payer: Self-pay | Admitting: Internal Medicine

## 2023-02-11 ENCOUNTER — Inpatient Hospital Stay: Payer: PPO

## 2023-02-11 VITALS — BP 134/57 | HR 61 | Temp 98.5°F | Resp 16 | Wt 219.6 lb

## 2023-02-11 DIAGNOSIS — N1832 Chronic kidney disease, stage 3b: Secondary | ICD-10-CM

## 2023-02-11 DIAGNOSIS — N183 Chronic kidney disease, stage 3 unspecified: Secondary | ICD-10-CM | POA: Diagnosis not present

## 2023-02-11 DIAGNOSIS — D509 Iron deficiency anemia, unspecified: Secondary | ICD-10-CM | POA: Insufficient documentation

## 2023-02-11 DIAGNOSIS — D649 Anemia, unspecified: Secondary | ICD-10-CM | POA: Diagnosis not present

## 2023-02-11 DIAGNOSIS — D508 Other iron deficiency anemias: Secondary | ICD-10-CM

## 2023-02-11 DIAGNOSIS — D631 Anemia in chronic kidney disease: Secondary | ICD-10-CM | POA: Diagnosis not present

## 2023-02-11 LAB — FERRITIN: Ferritin: 33 ng/mL (ref 11–307)

## 2023-02-11 LAB — CBC WITH DIFFERENTIAL/PLATELET
Abs Immature Granulocytes: 0.04 10*3/uL (ref 0.00–0.07)
Basophils Absolute: 0.1 10*3/uL (ref 0.0–0.1)
Basophils Relative: 1 %
Eosinophils Absolute: 0.3 10*3/uL (ref 0.0–0.5)
Eosinophils Relative: 3 %
HCT: 30.9 % — ABNORMAL LOW (ref 36.0–46.0)
Hemoglobin: 9 g/dL — ABNORMAL LOW (ref 12.0–15.0)
Immature Granulocytes: 1 %
Lymphocytes Relative: 12 %
Lymphs Abs: 1 10*3/uL (ref 0.7–4.0)
MCH: 23 pg — ABNORMAL LOW (ref 26.0–34.0)
MCHC: 29.1 g/dL — ABNORMAL LOW (ref 30.0–36.0)
MCV: 78.8 fL — ABNORMAL LOW (ref 80.0–100.0)
Monocytes Absolute: 0.8 10*3/uL (ref 0.1–1.0)
Monocytes Relative: 9 %
Neutro Abs: 6.4 10*3/uL (ref 1.7–7.7)
Neutrophils Relative %: 74 %
Platelets: 341 10*3/uL (ref 150–400)
RBC: 3.92 MIL/uL (ref 3.87–5.11)
RDW: 16.8 % — ABNORMAL HIGH (ref 11.5–15.5)
WBC: 8.5 10*3/uL (ref 4.0–10.5)
nRBC: 0 % (ref 0.0–0.2)

## 2023-02-11 LAB — IRON AND TIBC
Iron: 32 ug/dL (ref 28–170)
Saturation Ratios: 7 % — ABNORMAL LOW (ref 10.4–31.8)
TIBC: 442 ug/dL (ref 250–450)
UIBC: 410 ug/dL

## 2023-02-11 MED ORDER — DARBEPOETIN ALFA 100 MCG/0.5ML IJ SOSY
100.0000 ug | PREFILLED_SYRINGE | Freq: Once | INTRAMUSCULAR | Status: AC
  Administered 2023-02-11: 100 ug via SUBCUTANEOUS
  Filled 2023-02-11: qty 0.5

## 2023-02-11 NOTE — Progress Notes (Signed)
Julie Phillips  Telephone:(336) [phone] [phone]) [phone]  ID: Julie Phillips OB: 1932-04-26  MR#: [id number]  [id number]  Patient Care Team: Tonia Ghent, MD as PCP - General (Family Medicine) Rockey Situ Kathlene November, MD as PCP - Cardiology (Cardiology) Minna Merritts, MD as Consulting Physician (Cardiology) Doyce Para, MD as Consulting Physician (Ophthalmology) Estill Cotta, MD as Consulting Physician (Ophthalmology) Charlton Haws, [facility] as Pharmacist (Pharmacist)  REFERRING PROVIDER: Dr. Elsie Stain, MD  REASON FOR REFERRAL: anemia  HPI: Julie Phillips is a 89 y.o. female with past medical CHF, hyperlipidemia, hypertension, hypothyroidism, A-fib on warfarin was referred to hematology for workup of anemia.  Patient reports feeling fatigued for past 3 weeks.  She also experiences shortness of breath at rest or when she is talking too much.  Interestingly, however patient is able to walk around the home without issue.  She also reports dizziness and is concerned that she may pass out anytime.  Patient presented to ED on 12/03/2022 after she had a fall and hit her head.  CT head was unremarkable.  She was admitted beginning of December 2023 for same symptoms of shortness of breath, fatigue, melena and dizziness.  Her hemoglobin was 7.6 and she received 1 unit of PRBC transfusion.  There was also concern for GI bleed.  Had EGD and colonoscopy which did not show any bleeding.  Her Plavix was held.  She continues to be on Coumadin.  Labs were reviewed.  Patient developed mild anemia in April 2023.  Since December 2023, her anemia has been progressively worse ranging between 8-9.  WBC and platelets are normal.  MCV normal.  Iron panel from [id number] was normal.  Folic acid normal.  [id number].  Interval history Patient was seen today as follow-up for labs and Aranesp She received 2 doses of Aranesp injections.  She is tolerating injections well.  Her shortness  of breath has improved.  Does not feel short of breath while sitting and talking which she did previously.  Reported pica to ice.  Fingernails not growing.  REVIEW OF SYSTEMS:   ROS  As per HPI. Otherwise, a complete review of systems is negative.  PAST MEDICAL HISTORY: Past Medical History:  Diagnosis Date   Arrhythmia    atrial fibrillation   CHF (congestive heart failure) ([facility])    Chronic heart failure with preserved ejection fraction (HFpEF) ([place])    a. 01/2022 Echo: EF 65-70%, no rwma, mild LVH, GrII DD, nl RV fxn, mildly dil LA, mild MR, ao scelrosis.   Fall    GERD (gastroesophageal reflux disease)    History of chicken pox    Hx of colonic polyps    Hyperlipidemia    Hypertension    Hypothyroidism    Keratopathy 2018   R eye   LVH (left ventricular hypertrophy)    Persistent atrial fibrillation ([place])    a. Initially dx in 2012; b. 01/2022 recurrent AF rvr noted-->warfarin added (CHA2DS2VASc = 5); c. 03/2022 Amio added-->DCCV x 1 (150J).    PAST SURGICAL HISTORY: Past Surgical History:  Procedure Laterality Date   CARDIOVASCULAR STRESS TEST  2011   normal    CARDIOVERSION N/A 04/02/2022   Procedure: CARDIOVERSION;  Surgeon: Minna Merritts, MD;  Location: [facility] ORS;  Service: Cardiovascular;  Laterality: N/A;   COLONOSCOPY WITH PROPOFOL N/A 11/16/2022   Procedure: COLONOSCOPY WITH PROPOFOL;  Surgeon: Lesly Rubenstein, MD;  Location: [facility] ENDOSCOPY;  Service: Endoscopy;  Laterality: N/A;   CORONARY STENT  INTERVENTION N/A 05/06/2022   Procedure: CORONARY STENT INTERVENTION;  Surgeon: Wellington Hampshire, MD;  Location: [place] [facility];  Service: Cardiovascular;  Laterality: N/A;   ESOPHAGOGASTRODUODENOSCOPY (EGD) WITH PROPOFOL N/A 11/14/2022   Procedure: ESOPHAGOGASTRODUODENOSCOPY (EGD) WITH PROPOFOL;  Surgeon: Lesly Rubenstein, MD;  Location: [facility] ENDOSCOPY;  Service: Endoscopy;  Laterality: N/A;   keratopathy     LEFT HEART CATH AND CORONARY ANGIOGRAPHY N/A  05/06/2022   Procedure: LEFT HEART CATH AND CORONARY ANGIOGRAPHY;  Surgeon: Wellington Hampshire, MD;  Location: [place];  Service: Cardiovascular;  Laterality: N/A;    FAMILY HISTORY: Family History  Problem Relation Age of Onset   Heart disease Mother    Heart disease Father    Breast cancer Sister    Breast cancer Daughter    Colon cancer Neg Hx     HEALTH MAINTENANCE: Social History   Tobacco Use   Smoking status: Former    Packs/day: 2.00    Years: 20.00    Total pack years: 40.00    Types: Cigarettes    Quit date: 12/14/1971    Years since quitting: 51.1   Smokeless tobacco: Never  Vaping Use   Vaping Use: Never used  Substance Use Topics   Alcohol use: Not Currently   Drug use: No     Allergies  Allergen Reactions   Statins     Myalgias   Vitamin K And Related Shortness Of Breath   Wheat     Throat swelling   Jardiance [Empagliflozin] Other (See Comments)    Dry mouth, "gasping for breath"   Amlodipine     hematuria   Eucalyptus Oil    Lisinopril     Unrecalled BP med- possibly lisinopril- intolerant, had cough on the medicine- changed to cozaar   Rice Nausea And Vomiting    Current Outpatient Medications  Medication Sig Dispense Refill   amiodarone (PACERONE) 100 MG tablet Take 1 tablet (100 mg total) by mouth daily. 90 tablet 3   diltiazem (CARDIZEM CD) 120 MG 24 hr capsule Take 1 capsule (120 mg total) by mouth daily. 90 capsule 3   furosemide (LASIX) 20 MG tablet Take 1 tablet (20 mg total) by mouth 2 (two) times daily. 180 tablet 3   gabapentin (NEURONTIN) 100 MG capsule TAKE 1 CAPSULE BY MOUTH 3 TIMES DAILY ASNEEDED 270 capsule 3   levothyroxine (SYNTHROID) 100 MCG tablet TAKE 1 TABLET BY MOUTH ONCE A DAY BEFOREBREAKFAST. 90 tablet 3   losartan (COZAAR) 100 MG tablet Held as of 12/19/22     LOSARTAN POTASSIUM PO Apply 0.8 mg to eye in the morning, at noon, in the evening, and at bedtime.     metoprolol succinate (TOPROL-XL) 50 MG 24 hr  tablet Take 1 tablet (50 mg) by mouth once daily. Take with or immediately following a meal. 90 tablet 1   nitroGLYCERIN (NITROSTAT) 0.4 MG SL tablet Place 1 tablet (0.4 mg total) under the tongue every 5 (five) minutes as needed for chest pain. 25 tablet 3   pantoprazole (PROTONIX) 20 MG tablet Take 1 tablet (20 mg total) by mouth 2 (two) times daily before a meal. 180 tablet 3   Polyethyl Glycol-Propyl Glycol (SYSTANE) 0.4-0.3 % GEL ophthalmic gel Place 1 application into both eyes.     potassium chloride (KLOR-CON) 10 MEQ tablet TAKE 1 TABLET BY MOUTH ONCE A DAY 90 tablet 1   traZODone (DESYREL) 50 MG tablet TAKE 1/2 TO 1 TABLET BY MOUTH AT [facility]  NEEDED FOR SLEEP. 30 tablet 1   warfarin (COUMADIN) 5 MG tablet TAKE 1 TABLET BY MOUTH DAILY EXCEPT TAKE 1/2 TABLET ON MONDAYS, WEDNESDAYS AND FRIDAYS OR AS DIRECTED BY ANTICOAGULATION CLINIC 110 tablet 1   No current facility-administered medications for this visit.    OBJECTIVE: There were no vitals filed for this visit.   There is no height or weight on file to calculate BMI.      General: Well-developed, well-nourished, no acute distress. Eyes: Pink conjunctiva, anicteric sclera. HEENT: Normocephalic, moist mucous membranes, clear oropharnyx. Lungs: Clear to auscultation bilaterally. Heart: Regular rate and rhythm. No rubs, murmurs, or gallops. Abdomen: Soft, nontender, nondistended. No organomegaly noted, normoactive bowel sounds. Musculoskeletal: No edema, cyanosis, or clubbing. Neuro: Alert, answering all questions appropriately. Cranial nerves grossly intact. Skin: No rashes or petechiae noted. Psych: Normal affect. Lymphatics: No cervical, calvicular, axillary or inguinal LAD.   LAB RESULTS:  Lab Results  Component Value Date   NA 137 12/16/2022   K 3.8 12/16/2022   CL 102 12/16/2022   CO2 28 12/16/2022   GLUCOSE 94 12/16/2022   BUN 25 (H) 12/16/2022   CREATININE 1.31 (H) 12/16/2022   CALCIUM 9.4 12/16/2022   PROT 6.4  (L) 11/13/2022   ALBUMIN 3.2 (L) 11/13/2022   AST 18 11/13/2022   ALT 13 11/13/2022   ALKPHOS 58 11/13/2022   BILITOT 0.8 11/13/2022   GFRNONAA 60 (L) 11/16/2022   GFRAA 90 03/25/2020    Lab Results  Component Value Date   WBC 8.5 02/11/2023   NEUTROABS 6.4 02/11/2023   HGB 9.0 (L) 02/11/2023   HCT 30.9 (L) 02/11/2023   MCV 78.8 (L) 02/11/2023   PLT 341 02/11/2023    Lab Results  Component Value Date   TIBC 371 11/13/2022   FERRITIN 28.6 12/16/2022   IRONPCTSAT 11 11/13/2022     STUDIES: No results found.  ASSESSMENT AND PLAN:   Julie Phillips is a 89 y.o. female with pmh of CHF, CAD s/p stent, hyperlipidemia, hypertension, hypothyroidism, A-fib on warfarin was referred to hematology for workup of anemia.  # Anemia # CKD stage III # Microcytosis -Patient developed mild anemia in April 2023.  Since December 2023, anemia has been progressively worsening.  Her hemoglobin is between 8-9.  Iron panel showed normal ferritin, saturation 11%. B12 and folate was normal.  EGD and colonoscopy did not show any bleeding source. No M spike. Kappa lambda ratio slightly elevated likely from CKD. TSH normal.   -I am suspecting that her anemia is from chronic kidney disease.  Started on Aranesp 100 mics on 12/30/2022.  CBC today showed hemoglobin of 9 which is mild improvement.  I will continue with the same dose of Aranesp.  Recheck her iron levels today.    - Iron panel from November 2023 showed saturation of 11%.  She has new microcytosis and pica.  She likely has iron deficiency also.  Discussed with the patient about IV Venofer weekly x 5 doses to optimize iron level.  Occasional side effects such as back pain and nausea was discussed.  Low but potential risk of anaphylactic reaction was also discussed.  # A-fib s/p cardioversion # CHF # CAD status post stent -Follows with Dr. Rockey Situ.  On warfarin.  Could not afford NOACs.  Was recently seen by cardiology and no changes  made.   Orders Placed This Encounter  Procedures   CBC with Differential/Platelet   Iron and TIBC   Ferritin   IV Venofer weekly  x 5 Aranesp and H&H in 3 weeks RTC in 6 weeks for MD visit labs, Aranesp  Patient expressed understanding and was in agreement with this plan. She also understands that She can call clinic at any time with any questions, concerns, or complaints.   I spent a total of 30 minutes reviewing chart data, face-to-face evaluation with the patient, counseling and coordination of care as detailed above.  Jane Canary, MD   02/11/2023 1:04 PM

## 2023-02-17 MED FILL — Iron Sucrose Inj 20 MG/ML (Fe Equiv): INTRAVENOUS | Qty: 10 | Status: AC

## 2023-02-18 ENCOUNTER — Encounter: Payer: Self-pay | Admitting: Internal Medicine

## 2023-02-18 ENCOUNTER — Inpatient Hospital Stay: Payer: PPO

## 2023-02-18 VITALS — BP 146/38 | HR 63 | Resp 16

## 2023-02-18 DIAGNOSIS — N1832 Chronic kidney disease, stage 3b: Secondary | ICD-10-CM

## 2023-02-18 DIAGNOSIS — D631 Anemia in chronic kidney disease: Secondary | ICD-10-CM

## 2023-02-18 DIAGNOSIS — D649 Anemia, unspecified: Secondary | ICD-10-CM | POA: Diagnosis not present

## 2023-02-18 MED ORDER — SODIUM CHLORIDE 0.9 % IV SOLN
200.0000 mg | Freq: Once | INTRAVENOUS | Status: AC
  Administered 2023-02-18: 200 mg via INTRAVENOUS
  Filled 2023-02-18: qty 200

## 2023-02-18 MED ORDER — SODIUM CHLORIDE 0.9 % IV SOLN
Freq: Once | INTRAVENOUS | Status: AC
  Filled 2023-02-18: qty 250

## 2023-02-18 NOTE — Telephone Encounter (Signed)
I spoke with Julie Phillips with [employer] they have the ONO order and have called and left  a message for the patient to schedule delivery

## 2023-02-24 ENCOUNTER — Ambulatory Visit (INDEPENDENT_AMBULATORY_CARE_PROVIDER_SITE_OTHER): Payer: PPO

## 2023-02-24 DIAGNOSIS — Z7901 Long term (current) use of anticoagulants: Secondary | ICD-10-CM | POA: Diagnosis not present

## 2023-02-24 LAB — POCT INR: INR: 4 — AB (ref 2.0–3.0)

## 2023-02-24 NOTE — Progress Notes (Signed)
Pt started Darbepoetin Alfa infusions. Pt had a change in amiodarone dosing about 3 months ago. Pt also reports varying amounts of vitamin K rich foods. She may eat a lot for several days and then not eat any. Educated pt she needs to be consistently with these foods. Pt verbalized understanding. Hold dose today and then change weekly dose to take 1 tablet daily except take 1/2 tablet on Tuesdays and Fridays. Recheck in 2 weeks.

## 2023-02-24 NOTE — Patient Instructions (Addendum)
Pre visit review using our clinic review tool, if applicable. No additional management support is needed unless otherwise documented below in the visit note.  Hold dose today and then change weekly dose to take 1 tablet daily except take 1/2 tablet on Tuesdays and Fridays. Recheck in 2 weeks.

## 2023-02-25 ENCOUNTER — Inpatient Hospital Stay: Payer: PPO

## 2023-02-25 VITALS — BP 121/59 | HR 60 | Temp 98.0°F | Resp 16

## 2023-02-25 DIAGNOSIS — D649 Anemia, unspecified: Secondary | ICD-10-CM | POA: Diagnosis not present

## 2023-02-25 DIAGNOSIS — N1832 Chronic kidney disease, stage 3b: Secondary | ICD-10-CM

## 2023-02-25 DIAGNOSIS — D631 Anemia in chronic kidney disease: Secondary | ICD-10-CM

## 2023-02-25 MED ORDER — SODIUM CHLORIDE 0.9 % IV SOLN
200.0000 mg | Freq: Once | INTRAVENOUS | Status: AC
  Administered 2023-02-25: 200 mg via INTRAVENOUS
  Filled 2023-02-25: qty 200

## 2023-02-25 MED ORDER — SODIUM CHLORIDE 0.9 % IV SOLN
Freq: Once | INTRAVENOUS | Status: AC
  Filled 2023-02-25: qty 250

## 2023-03-02 MED FILL — Iron Sucrose Inj 20 MG/ML (Fe Equiv): INTRAVENOUS | Qty: 10 | Status: AC

## 2023-03-03 ENCOUNTER — Inpatient Hospital Stay (HOSPITAL_BASED_OUTPATIENT_CLINIC_OR_DEPARTMENT_OTHER): Payer: PPO | Admitting: Internal Medicine

## 2023-03-03 ENCOUNTER — Inpatient Hospital Stay: Payer: PPO

## 2023-03-03 VITALS — BP 132/85 | HR 67

## 2023-03-03 DIAGNOSIS — D649 Anemia, unspecified: Secondary | ICD-10-CM | POA: Diagnosis not present

## 2023-03-03 DIAGNOSIS — D508 Other iron deficiency anemias: Secondary | ICD-10-CM

## 2023-03-03 DIAGNOSIS — N183 Chronic kidney disease, stage 3 unspecified: Secondary | ICD-10-CM

## 2023-03-03 DIAGNOSIS — N1832 Chronic kidney disease, stage 3b: Secondary | ICD-10-CM

## 2023-03-03 DIAGNOSIS — D631 Anemia in chronic kidney disease: Secondary | ICD-10-CM

## 2023-03-03 LAB — HEMOGLOBIN AND HEMATOCRIT (CANCER CENTER ONLY)
HCT: 32.4 % — ABNORMAL LOW (ref 36.0–46.0)
Hemoglobin: 9.6 g/dL — ABNORMAL LOW (ref 12.0–15.0)

## 2023-03-03 MED ORDER — DARBEPOETIN ALFA 100 MCG/0.5ML IJ SOSY
100.0000 ug | PREFILLED_SYRINGE | Freq: Once | INTRAMUSCULAR | Status: AC
  Administered 2023-03-03: 100 ug via SUBCUTANEOUS
  Filled 2023-03-03: qty 0.5

## 2023-03-03 MED FILL — Iron Sucrose Inj 20 MG/ML (Fe Equiv): INTRAVENOUS | Qty: 10 | Status: AC

## 2023-03-04 ENCOUNTER — Inpatient Hospital Stay: Payer: PPO

## 2023-03-04 VITALS — BP 114/67 | HR 62 | Temp 97.1°F | Resp 18

## 2023-03-04 DIAGNOSIS — D649 Anemia, unspecified: Secondary | ICD-10-CM | POA: Diagnosis not present

## 2023-03-04 DIAGNOSIS — D631 Anemia in chronic kidney disease: Secondary | ICD-10-CM

## 2023-03-04 DIAGNOSIS — N1832 Chronic kidney disease, stage 3b: Secondary | ICD-10-CM

## 2023-03-04 MED ORDER — SODIUM CHLORIDE 0.9 % IV SOLN
Freq: Once | INTRAVENOUS | Status: AC
  Filled 2023-03-04: qty 250

## 2023-03-04 MED ORDER — SODIUM CHLORIDE 0.9 % IV SOLN
200.0000 mg | Freq: Once | INTRAVENOUS | Status: AC
  Administered 2023-03-04: 200 mg via INTRAVENOUS
  Filled 2023-03-04: qty 200

## 2023-03-04 NOTE — Patient Instructions (Signed)

## 2023-03-07 ENCOUNTER — Other Ambulatory Visit: Payer: Self-pay | Admitting: Family Medicine

## 2023-03-07 ENCOUNTER — Other Ambulatory Visit: Payer: Self-pay | Admitting: Cardiovascular Disease

## 2023-03-09 ENCOUNTER — Telehealth: Payer: Self-pay

## 2023-03-09 NOTE — Progress Notes (Signed)
Care Management & Coordination Services Pharmacy Team  Reason for Encounter: Appointment Reminder  Contacted patient to confirm telephone appointment with Charlene Brooke , PharmD on 03/14/23 at 11:00 Unsuccessful outreach. Left voicemail for patient to return call.   Hospital visits:  None in previous 6 months  Unsuccessful outreach to review star medications with patient  [name] Rating Drugs:  Medication:  Last Fill: Day Supply Losartan 100mg  10/09/22 90   Care Gaps: Annual wellness visit in last year? Yes  Charlene Brooke, PharmD notified  Avel Sensor, [place] Assistant [phone]

## 2023-03-10 ENCOUNTER — Ambulatory Visit (INDEPENDENT_AMBULATORY_CARE_PROVIDER_SITE_OTHER): Payer: PPO

## 2023-03-10 DIAGNOSIS — Z7901 Long term (current) use of anticoagulants: Secondary | ICD-10-CM | POA: Diagnosis not present

## 2023-03-10 LAB — POCT INR: INR: 2.2 (ref 2.0–3.0)

## 2023-03-10 NOTE — Patient Instructions (Addendum)
Pre visit review using our clinic review tool, if applicable. No additional management support is needed unless otherwise documented below in the visit note.  Continue 1 tablet daily except take 1/2 tablet on Tuesdays and Fridays. Recheck in 4 weeks.  

## 2023-03-10 NOTE — Progress Notes (Signed)
Continue 1 tablet daily except take 1/2 tablet on Tuesdays and Fridays. Recheck in 4 weeks.  

## 2023-03-11 ENCOUNTER — Inpatient Hospital Stay: Payer: PPO

## 2023-03-14 ENCOUNTER — Ambulatory Visit: Payer: PPO | Admitting: Pharmacist

## 2023-03-14 ENCOUNTER — Inpatient Hospital Stay: Payer: PPO | Attending: Internal Medicine

## 2023-03-14 VITALS — BP 109/39 | HR 61 | Temp 99.7°F | Resp 18

## 2023-03-14 DIAGNOSIS — D509 Iron deficiency anemia, unspecified: Secondary | ICD-10-CM | POA: Insufficient documentation

## 2023-03-14 DIAGNOSIS — N183 Chronic kidney disease, stage 3 unspecified: Secondary | ICD-10-CM | POA: Insufficient documentation

## 2023-03-14 DIAGNOSIS — N1832 Chronic kidney disease, stage 3b: Secondary | ICD-10-CM

## 2023-03-14 DIAGNOSIS — D631 Anemia in chronic kidney disease: Secondary | ICD-10-CM

## 2023-03-14 MED ORDER — SODIUM CHLORIDE 0.9 % IV SOLN
Freq: Once | INTRAVENOUS | Status: AC
  Filled 2023-03-14: qty 250

## 2023-03-14 MED ORDER — SODIUM CHLORIDE 0.9 % IV SOLN
200.0000 mg | Freq: Once | INTRAVENOUS | Status: AC
  Administered 2023-03-14: 200 mg via INTRAVENOUS
  Filled 2023-03-14: qty 200

## 2023-03-14 NOTE — Progress Notes (Signed)
Care Management & Coordination Services Pharmacy Note  03/14/2023 Name:  Julie Phillips MRN:  [id number] DOB:  07/31/1932  Summary: F/U visit -HTN: pt reports SBP 130-140 generally, she has been off losartan since January -HLD/CAD: LDL 161 (07/2022) is elevated; reviewed new LDL goal < 70 given recent ASCVD (stent to LAD 04/2022). She is intolerant to statins and ezetimibe; discussed PCSK9-inhibitors today and pt is willing to try these but wants to wait until iron infusions/anemia is resolved  Recommendations/Changes made from today's visit: -Removed losartan from med list -Revisit PCSK9-inhibitory therapy next month  Follow up plan: -Pharmacist follow up televisit scheduled for 1 month -Hematology appt 03/25/23    Subjective: Julie Phillips is an 89 y.o. year old female who is a primary patient of Damita Dunnings, Elveria Rising, MD.  The care coordination team was consulted for assistance with disease management and care coordination needs.    Engaged with patient by telephone for follow up visit.  Recent office visits: 11/19/22 Dr Damita Dunnings OV: hospital f/u - symptomatic anemia.   10/01/22 Dr Damita Dunnings OV: trouble sleeping, daughter died recently. Rx trazodone.  Jul 05, 2022 Dr Damita Dunnings OV: annual - switch omeprazole to pantoprazole 20 mg (clopidogrel DDI)  Recent consult visits: 02/11/23 Dr Darrall Dears (Heme/onc): f/u - started aranesp 12/1822. HGB 9 now.   01/28/23 NP Volanda Napoleon (Pulm): HST 11/2022 w/ moderate OSA. Hesitant to wear cpap. Goal weight loss 10-15 lbs.   01/03/23 Dr Rockey Situ (Cardiology): f/u - pt concerned about PCSK9 cost.  12/24/22 Dr Darrall Dears (Heme/onc): anemia - likely of chronic disease. Consider Aranesp.  11/26/22 NP Hammock (Cardiology): f/u - pt reports SE with pantoprazole, advised to reduce to once daily (do not stop).  11/12/22 NP Hammock (Cardiology): SOB w/ difficulty swallowing - sent to ED for eval.  10/22/22 NP Hammock (Cardiology): f/u - low pulse 50s. Hold PM dose of  metoprolol.  10/06/22 NP Volanda Napoleon (Pulmonology): sleep consult - ordered home sleep test. Rx famotidine 20 mg HS for wheezing/underlying reflux.  09/30/22 NP Hammock (Cardiology): SOB - referral to pulmonary for OSA eval. Discuss PCSK9 at f/u.  09/21/22 NP Hammock (Cardiology): new SOB, PND - labs and xray. Discuss PCSK9 next visit. BNP elevated, increase Lasix to 40 mg daily and schedule CT to r/o PE - negative PE.  06/18/22 Dr Rockey Situ (Cardiology): f/u - start ezetimibe 10 mg. D/c aspirin, metolazone.   Hospital visits: 12/03/22 ED visit [facility]): fall -   11/12/22 - 11/16/22 Admission [facility]): symptomatic anemia - given 1 u PRBC. GI workup unremarkable. Hold plavix and continue warfarin.   Objective:  Lab Results  Component Value Date   CREATININE 1.31 (H) 12/16/2022   BUN 25 (H) 12/16/2022   GFR 36.46 (L) 12/16/2022   GFRNONAA 60 (L) 11/16/2022   GFRAA 90 03/25/2020   NA 137 12/16/2022   K 3.8 12/16/2022   CALCIUM 9.4 12/16/2022   CO2 28 12/16/2022   GLUCOSE 94 12/16/2022    Lab Results  Component Value Date/Time   GFR 36.46 (L) 12/16/2022 11:01 AM   GFR 48.68 (L) 11/25/2022 10:26 AM    Last diabetic Eye exam: No results found for: "HMDIABEYEEXA"  Last diabetic Foot exam: No results found for: "HMDIABFOOTEX"   Lab Results  Component Value Date   CHOL 238 (H) 08/12/2022   HDL 54.40 08/12/2022   LDLCALC 161 (H) 08/12/2022   LDLDIRECT 152.6 12/31/2013   TRIG 112.0 08/12/2022   CHOLHDL 4 08/12/2022       Latest Ref Rng & Units 11/13/2022  5:56 AM 04/01/2022   11:10 AM 02/08/2022    5:19 PM  Hepatic Function  Total Protein 6.5 - 8.1 g/dL 6.4  7.4  7.0   Albumin 3.5 - 5.0 g/dL 3.2  3.5  3.4   AST 15 - 41 U/L 18  15  14    ALT 0 - 44 U/L 13  10  8    Alk Phosphatase 38 - 126 U/L 58  91  85   Total Bilirubin 0.3 - 1.2 mg/dL 0.8  0.8  0.7   Bilirubin, Direct 0.0 - 0.2 mg/dL  0.2      Lab Results  Component Value Date/Time   TSH 1.568 12/24/2022 11:49 AM   TSH 2.34  08/12/2022 11:09 AM   TSH 2.542 04/01/2022 11:10 AM   TSH 1.26 09/14/2021 09:55 AM       Latest Ref Rng & Units 03/03/2023   12:59 PM 02/11/2023   12:48 PM 01/20/2023   12:40 PM  CBC  WBC 4.0 - 10.5 K/uL  8.5    Hemoglobin 12.0 - 15.0 g/dL 9.6  9.0  8.5   Hematocrit 36.0 - 46.0 % 32.4  30.9  28.3   Platelets 150 - 400 K/uL  341     Iron/TIBC/Ferritin/ %Sat    Component Value Date/Time   IRON 32 02/11/2023 1248   TIBC 442 02/11/2023 1248   FERRITIN 33 02/11/2023 1248   IRONPCTSAT 7 (L) 02/11/2023 1248    Lab Results  Component Value Date/Time   VD25OH 35 07/10/2021 03:08 PM   VD25OH 31.42 08/04/2020 11:47 AM   VD25OH 34.70 07/09/2019 11:26 AM   VITAMINB12 2,032 (H) 11/13/2022 04:37 PM    Clinical ASCVD: Yes  The ASCVD Risk score (Arnett DK, et al., 2019) failed to calculate for the following reasons:   The 2019 ASCVD risk score is only valid for ages 54 to 85    CHA2DS2/VAS Stroke Risk Points  Current as of yesterday     6 >= 2 Points: High Risk    Points Metrics  1 Has Congestive Heart Failure:  Yes    Current as of yesterday  1 Has Vascular Disease:  Yes    Current as of yesterday  1 Has Hypertension:  Yes    Current as of yesterday  2 Age:  89    Current as of yesterday  0 Has Diabetes:  No    Current as of yesterday  0 Had Stroke:  No  Had TIA:  No  Had Thromboembolism:  No    Current as of yesterday  1 Female:  Yes    Current as of yesterday        06/24/2022   12:06 PM 04/15/2022   10:53 AM 07/09/2019   11:23 AM  Depression screen PHQ 2/9  Decreased Interest 0 0 0  Down, Depressed, Hopeless 0 0 0  PHQ - 2 Score 0 0 0  Altered sleeping   0  Tired, decreased energy   0  Change in appetite   0  Feeling bad or failure about yourself    0  Trouble concentrating   0  Moving slowly or fidgety/restless   0  Suicidal thoughts   0  PHQ-9 Score   0     Social History   Tobacco Use  Smoking Status Former   Packs/day: 2.00   Years: 20.00   Additional  pack years: 0.00   Total pack years: 40.00   Types: Cigarettes   Quit date:  12/14/1971   Years since quitting: 51.2  Smokeless Tobacco Never   BP Readings from Last 3 Encounters:  03/04/23 114/67  03/03/23 132/85  02/25/23 (!) 121/59   Pulse Readings from Last 3 Encounters:  03/04/23 62  03/03/23 67  02/25/23 60   Wt Readings from Last 3 Encounters:  02/11/23 219 lb 9.6 oz (99.6 kg)  01/28/23 222 lb 9.6 oz (101 kg)  01/03/23 223 lb 8 oz (101.4 kg)   BMI Readings from Last 3 Encounters:  02/11/23 41.49 kg/m  01/28/23 42.06 kg/m  01/03/23 42.23 kg/m    Allergies  Allergen Reactions   Statins     Myalgias   Vitamin K And Related Shortness Of Breath   Wheat     Throat swelling   Ezetimibe Other (See Comments)    Extreme fatigue   Jardiance [Empagliflozin] Other (See Comments)    Dry mouth, "gasping for breath"   Amlodipine     hematuria   Eucalyptus Oil    Lisinopril     Unrecalled BP med- possibly lisinopril- intolerant, had cough on the medicine- changed to cozaar   Rice Nausea And Vomiting    Medications Reviewed Today     Reviewed by Charlton Haws, [facility] (Pharmacist) on 03/14/23 at 1144  Med List Status: <None>   Medication Order Taking? Sig Documenting Provider Last Dose Status Informant  amiodarone (PACERONE) 100 MG tablet [id number] Yes Take 1 tablet (100 mg total) by mouth daily. Gerrie Nordmann, NP Taking Active   diltiazem (CARDIZEM CD) 120 MG 24 hr capsule [id number] Yes Take 1 capsule (120 mg total) by mouth daily. Minna Merritts, MD Taking Active Self  furosemide (LASIX) 20 MG tablet [id number] Yes Take 1 tablet (20 mg total) by mouth 2 (two) times daily. Minna Merritts, MD Taking Active   gabapentin (NEURONTIN) 100 MG capsule [id number] Yes TAKE 1 CAPSULE BY MOUTH 3 TIMES DAILY ASNEEDED Tonia Ghent, MD Taking Active Self  levothyroxine (SYNTHROID) 100 MCG tablet [id number] Yes TAKE 1 TABLET BY MOUTH ONCE A DAY BEFOREBREAKFAST. Tonia Ghent, MD Taking Active   Patient not taking:  Discontinued 03/14/23 1144 (Patient Preference)   metoprolol succinate (TOPROL-XL) 50 MG 24 hr tablet [id number] Yes TAKE ONE TABLET BY MOUTH TWICE A DAY. TAKE WITH FOOD Rockey Situ, Kathlene November, MD Taking Active   nitroGLYCERIN (NITROSTAT) 0.4 MG SL tablet [id number] Yes Place 1 tablet (0.4 mg total) under the tongue every 5 (five) minutes as needed for chest pain. Minna Merritts, MD Taking Active Self  pantoprazole (PROTONIX) 20 MG tablet [id number] Yes Take 1 tablet (20 mg total) by mouth 2 (two) times daily before a meal. Hammock, Sheri, NP Taking Active   Polyethyl Glycol-Propyl Glycol (SYSTANE) 0.4-0.3 % GEL ophthalmic gel [id number] Yes Place 1 application into both eyes. [provider] Taking Active Self  potassium chloride (KLOR-CON) 10 MEQ tablet [id number] Yes TAKE ONE TABLET BY MOUTH ONCE A DAY Tonia Ghent, MD Taking Active   traZODone (DESYREL) 50 MG tablet [id number] Yes TAKE 1/2 TO 1 TABLET BY MOUTH AT BEDTIMEAS NEEDED FOR SLEEP. Tonia Ghent, MD Taking Active   warfarin (COUMADIN) 5 MG tablet [id number] Yes TAKE 1 TABLET BY MOUTH DAILY EXCEPT TAKE 1/2 TABLET ON MONDAYS, WEDNESDAYS AND FRIDAYS OR AS DIRECTED BY ANTICOAGULATION CLINIC Tonia Ghent, MD Taking Active             SDOH:  (Social Determinants of Health) assessments and interventions performed: No SDOH Interventions  Flowsheet Row Telephone from 11/18/2022 in [facility] from 01/15/2022 in [place] at [facility] from 07/09/2019 in [place] at [place] Interventions Intervention Not Indicated Intervention Not Indicated --  Housing Interventions Intervention Not Indicated -- --  Transportation Interventions Intervention Not Indicated -- --  Utilities Interventions Intervention Not Indicated  -- --  Depression Interventions/Treatment  -- -- [id number] Score <4 Follow-up Not Indicated  Financial Strain Interventions -- Intervention Not Indicated --       Medication Assistance: None required.  Patient affirms current coverage meets needs.  Medication Access: Within the past 30 days, how often has patient missed a dose of medication? 0 Is a pillbox or other method used to improve adherence? Yes  Factors that may affect medication adherence? no barriers identified Are meds synced by current pharmacy? Yes  Are meds delivered by current pharmacy? Yes  Does patient experience delays in picking up medications due to transportation concerns? No   [facility] Reviewed: Is patient disadvantaged to use [facility]?: Yes  Current Rx insurance plan: HTA Name and location of Current pharmacy:  [place], [address] [place] [place] [address] Phone: [phone] Fax: [phone]  [facility] services reviewed with patient today?: No  Patient requests to transfer care to [facility]?: No  Reason patient declined to change pharmacies: Disadvantaged due to insurance/mail order  Compliance/Adherence/Medication fill history: Care Gaps: None  [name]-Rating Drugs: None   ASSESSMENT / PLAN  Hypertension / Heart Failure (BP goal <140/90) -Controlled - BP at home is at goal, pt reports no change in BP readings since stopping losartan in January -Home BP: 130-140/60s; 134/69 -Last ejection fraction: 65-70% (Date: 02/10/22) -HF type: HFpEF (EF > 50%); NYHA Class: II (slight limitation of activity) -Current treatment: Losartan 100 mg daily PM - not taking. Removed from list. Metoprolol succinate 50 mg BID - Appropriate, Effective, Safe, Accessible Furosemide 20 mg -2 tablets daily - Appropriate, Effective, Safe, Accessible Diltiazem CD 120 mg daily - Appropriate, Effective, Safe, Accessible -Medications previously  tried: amlodipine, lisinopril, Jardiance, spironolactone -Educated on BP goals and benefits of medications for prevention of heart attack, stroke and kidney damage; -Educated on Benefits of medications for managing symptoms and prolonging life Importance of weighing daily;  -Counseled to monitor BP and weight at home daily -Recommended to continue current medication  Atrial Fibrillation (Goal: prevent stroke and major bleeding) -Controlled -  INR 2.2 last week, pt has hx of fluctuating INR < 2 and > 3 for many months -CHADSVASC: 4; Dx 2007, recurrence 01/2022 and started on warfarin at that time -Current treatment: Metoprolol succinate 50 mg daily - Appropriate, Effective, Safe, Accessible Diltiazem CD 120 mg daily -Appropriate, Effective, Safe, Accessible Amiodarone 200 mg daily -Appropriate, Effective, Safe, Accessible Warfarin 5 mg as directed -Appropriate, Query Effective -Medications previously tried: warfarin (stopped due to frequent falls) -Counseled on importance of adherence to anticoagulant exactly as prescribed; bleeding risk associated with warfarin and importance of self-monitoring for signs/symptoms of bleeding; importance of regular laboratory monitoring; -Reviewed anticoagulation options - pt had declined Factor Xa inhibitors due to cost -Recommended to continue current medication  Hyperlipidemia / CAD (LDL goal < 130) -Uncontrolled - LDL 161 (07/2022) above goal; she has refused statins; she stopped taking ezetimibe after a week due to side effects (fatigue, "felt awful") -Coronary CT 04/2022 - Ca  score 902 (87th percentile). Stent to LAD 04/2022. -Has taken NTG once (before cath) -Current treatment: Clopidogrel 75 mg daily - on hold Nitroglycerin 0.4 mg SL prn -Appropriate, Effective, Safe, Accessible -Medications previously tried: ezetimibe, statins, clopidogrel -Educated on Cholesterol goals - LDL goal < 70 now that she has established ASCVD. Discussed difference between  total cholesterol and LDL, importance of achieving LDL goal to prevent ASCVD recurrence; discussed PCSK9-inhibitors, pt is willing to try these but wants to wait until she is done with iron infusions/anemia issues -Recommend to continue current medication; revisit PCSK9 therapy next month  Anemia (Goal: HGB > 11) -Improving - HGB 9.6 (03/03/23) improved from 9.0; TSAT 7 (02/11/23) -pt reports iron infusions have improved energy levels significantly;  -Follows with hematology -Current treatment  Aranesp (12/30/22 - )- Appropriate, Effective, Safe, Accessible Iron infusions (3/8, 3/15, 3/22, 4/1) - Appropriate, Effective, Safe, Accessible -Medications previously tried: n/a  -Recommended to continue current medication  Osteopenia (Goal prevent fractures) -Not ideally controlled -pt is not taking calcium or vitamin D supplements -Last DEXA Scan: 02/2015   T-Score femoral neck: -1.6  T-Score forearm radius: 0.0  10-year probability of major osteoporotic fracture: 12.2%  10-year probability of hip fracture: 2.2% -Patient is not a candidate for pharmacologic treatment -Current treatment  None -Medications previously tried: none  -Recommend weight-bearing and muscle strengthening exercises for building and maintaining bone density.  Health Maintenance -Vaccine gaps: Flu, TDAP, Shingrix -hx GERD/GI bleeding: on pantoprazole 20 mg BID -HypoTH: on levothyroxine 100 mcg  Charlene Brooke, PharmD, [place], CPP Clinical Pharmacist Practitioner [place] at [facility] [phone]

## 2023-03-14 NOTE — Patient Instructions (Signed)
Visit Information  Phone number for Pharmacist: [phone]  Thank you for meeting with me to discuss your medications! Below is a summary of what we talked about during the visit:   Recommendations/Changes made from today's visit: -Removed losartan from med list -Revisit PCSK9-inhibitory therapy next month  Follow up plan: -Pharmacist follow up televisit scheduled for 1 month -Hematology appt 03/25/23   Charlene Brooke, PharmD, BCACP Clinical Pharmacist [facility] [phone]

## 2023-03-14 NOTE — Patient Instructions (Signed)

## 2023-03-18 ENCOUNTER — Inpatient Hospital Stay: Payer: PPO

## 2023-03-18 VITALS — BP 114/58 | HR 57 | Temp 96.9°F | Resp 18

## 2023-03-18 DIAGNOSIS — N1832 Chronic kidney disease, stage 3b: Secondary | ICD-10-CM

## 2023-03-18 DIAGNOSIS — D509 Iron deficiency anemia, unspecified: Secondary | ICD-10-CM | POA: Diagnosis not present

## 2023-03-18 DIAGNOSIS — D631 Anemia in chronic kidney disease: Secondary | ICD-10-CM

## 2023-03-18 MED ORDER — SODIUM CHLORIDE 0.9 % IV SOLN
Freq: Once | INTRAVENOUS | Status: AC
  Filled 2023-03-18: qty 250

## 2023-03-18 MED ORDER — SODIUM CHLORIDE 0.9 % IV SOLN
200.0000 mg | Freq: Once | INTRAVENOUS | Status: AC
  Administered 2023-03-18: 200 mg via INTRAVENOUS
  Filled 2023-03-18: qty 200

## 2023-03-24 ENCOUNTER — Encounter: Payer: Self-pay | Admitting: Internal Medicine

## 2023-03-24 MED FILL — Iron Sucrose Inj 20 MG/ML (Fe Equiv): INTRAVENOUS | Qty: 10 | Status: AC

## 2023-03-24 NOTE — Progress Notes (Signed)
Updated aranesp

## 2023-03-25 ENCOUNTER — Inpatient Hospital Stay (HOSPITAL_BASED_OUTPATIENT_CLINIC_OR_DEPARTMENT_OTHER): Payer: PPO | Admitting: Internal Medicine

## 2023-03-25 ENCOUNTER — Inpatient Hospital Stay: Payer: PPO

## 2023-03-25 VITALS — BP 116/59 | HR 56 | Temp 98.7°F | Resp 17 | Wt 217.9 lb

## 2023-03-25 DIAGNOSIS — D631 Anemia in chronic kidney disease: Secondary | ICD-10-CM

## 2023-03-25 DIAGNOSIS — N183 Chronic kidney disease, stage 3 unspecified: Secondary | ICD-10-CM

## 2023-03-25 DIAGNOSIS — D508 Other iron deficiency anemias: Secondary | ICD-10-CM

## 2023-03-25 DIAGNOSIS — D509 Iron deficiency anemia, unspecified: Secondary | ICD-10-CM | POA: Diagnosis not present

## 2023-03-25 LAB — HEMOGLOBIN AND HEMATOCRIT (CANCER CENTER ONLY)
HCT: 38 % (ref 36.0–46.0)
Hemoglobin: 11.7 g/dL — ABNORMAL LOW (ref 12.0–15.0)

## 2023-03-25 NOTE — Progress Notes (Signed)
[facility]  [phone] [phone]  ID: Julie Phillips OB: [birth year]  MR#: [id number]  LKG#:[id number]  Patient Care Team: Joaquim Nam, MD as PCP - General (Family Medicine) Mariah Milling Tollie Pizza, MD as PCP - Cardiology (Cardiology) Antonieta Iba, MD as Consulting Physician (Cardiology) Aida Puffer, MD as Consulting Physician (Ophthalmology) Sallee Lange, MD as Consulting Physician (Ophthalmology) Kathyrn Sheriff, [facility] as Pharmacist (Pharmacist) Michaelyn Barter, MD as Consulting Physician (Oncology)    HPI: Julie Phillips is a 89 y.o. female with past medical CHF, hyperlipidemia, hypertension, hypothyroidism, A-fib on warfarin was referred to hematology for workup of anemia.  Patient reports feeling fatigued for past 3 weeks.  She also experiences shortness of breath at rest or when she is talking too much.  Interestingly, however patient is able to walk around the home without issue.  She also reports dizziness and is concerned that she may pass out anytime.  Patient presented to ED on 12/03/2022 after she had a fall and hit her head.  CT head was unremarkable.  She was admitted beginning of December 2023 for same symptoms of shortness of breath, fatigue, melena and dizziness.  Her hemoglobin was 7.6 and she received 1 unit of PRBC transfusion.  There was also concern for GI bleed.  Had EGD and colonoscopy which did not show any bleeding.  Her Plavix was held.  She continues to be on Coumadin.   Interval history Patient was seen today as follow-up for labs and Aranesp She completed 5 infusions of Venofer.  She reports significant improvement in her quality of life.  Her energy level is better.  She is able to do more day-to-day activities without getting tired.  She tells me that prior to the iron infusion she would walk from the kitchen to the sitting room and will get out of breath which is not happening anymore.  No more dark  tarry stools.  Having issues with maintaining INR on Coumadin. Her pica for ice has resolved.  REVIEW OF SYSTEMS:   ROS  As per HPI. Otherwise, a complete review of systems is negative.  PAST MEDICAL HISTORY: Past Medical History:  Diagnosis Date   Arrhythmia    atrial fibrillation   CHF (congestive heart failure) (HCC)    Chronic heart failure with preserved ejection fraction (HFpEF) (HCC)    a. 01/2022 Echo: EF 65-70%, no rwma, mild LVH, GrII DD, nl RV fxn, mildly dil LA, mild MR, ao scelrosis.   Fall    GERD (gastroesophageal reflux disease)    History of chicken pox    Hx of colonic polyps    Hyperlipidemia    Hypertension    Hypothyroidism    Keratopathy 2018   R eye   LVH (left ventricular hypertrophy)    Persistent atrial fibrillation (HCC)    a. Initially dx in 2012; b. 01/2022 recurrent AF rvr noted-->warfarin added (CHA2DS2VASc = 5); c. 03/2022 Amio added-->DCCV x 1 (150J).    PAST SURGICAL HISTORY: Past Surgical History:  Procedure Laterality Date   CARDIOVASCULAR STRESS TEST  2011   normal    CARDIOVERSION N/A 04/02/2022   Procedure: CARDIOVERSION;  Surgeon: Antonieta Iba, MD;  Location: [facility] ORS;  Service: Cardiovascular;  Laterality: N/A;   COLONOSCOPY WITH PROPOFOL N/A 11/16/2022   Procedure: COLONOSCOPY WITH PROPOFOL;  Surgeon: Regis Bill, MD;  Location: [facility] ENDOSCOPY;  Service: Endoscopy;  Laterality: N/A;   CORONARY STENT INTERVENTION N/A 05/06/2022   Procedure: CORONARY STENT INTERVENTION;  Surgeon: Iran Ouch, MD;  Location: [facility] INVASIVE CV LAB;  Service: Cardiovascular;  Laterality: N/A;   ESOPHAGOGASTRODUODENOSCOPY (EGD) WITH PROPOFOL N/A 11/14/2022   Procedure: ESOPHAGOGASTRODUODENOSCOPY (EGD) WITH PROPOFOL;  Surgeon: Regis Bill, MD;  Location: [facility] ENDOSCOPY;  Service: Endoscopy;  Laterality: N/A;   keratopathy     LEFT HEART CATH AND CORONARY ANGIOGRAPHY N/A 05/06/2022   Procedure: LEFT HEART CATH AND CORONARY ANGIOGRAPHY;   Surgeon: Iran Ouch, MD;  Location: [facility] INVASIVE CV LAB;  Service: Cardiovascular;  Laterality: N/A;    FAMILY HISTORY: Family History  Problem Relation Age of Onset   Heart disease Mother    Heart disease Father    Breast cancer Sister    Breast cancer Daughter    Colon cancer Neg Hx     HEALTH MAINTENANCE: Social History   Tobacco Use   Smoking status: Former    Packs/day: 2.00    Years: 20.00    Additional pack years: 0.00    Total pack years: 40.00    Types: Cigarettes    Quit date: 12/14/1971    Years since quitting: 51.3   Smokeless tobacco: Never  Vaping Use   Vaping Use: Never used  Substance Use Topics   Alcohol use: Not Currently   Drug use: No     Allergies  Allergen Reactions   Statins     Myalgias   Vitamin K And Related Shortness Of Breath   Wheat     Throat swelling   Ezetimibe Other (See Comments)    Extreme fatigue   Jardiance [Empagliflozin] Other (See Comments)    Dry mouth, "gasping for breath"   Amlodipine     hematuria   Eucalyptus Oil    Lisinopril     Unrecalled BP med- possibly lisinopril- intolerant, had cough on the medicine- changed to cozaar   Rice Nausea And Vomiting    Current Outpatient Medications  Medication Sig Dispense Refill   amiodarone (PACERONE) 100 MG tablet Take 1 tablet (100 mg total) by mouth daily. 90 tablet 3   clopidogrel (PLAVIX) 75 MG tablet Take 75 mg by mouth daily.     diltiazem (CARDIZEM CD) 120 MG 24 hr capsule Take 1 capsule (120 mg total) by mouth daily. 90 capsule 3   furosemide (LASIX) 20 MG tablet Take 1 tablet (20 mg total) by mouth 2 (two) times daily. 180 tablet 3   gabapentin (NEURONTIN) 100 MG capsule TAKE 1 CAPSULE BY MOUTH 3 TIMES DAILY ASNEEDED 270 capsule 3   levothyroxine (SYNTHROID) 100 MCG tablet TAKE 1 TABLET BY MOUTH ONCE A DAY BEFOREBREAKFAST. 90 tablet 3   LOSARTAN POTASSIUM PO Apply 0.8 % to eye daily. Compound solution     metoprolol succinate (TOPROL-XL) 50 MG 24 hr tablet  TAKE ONE TABLET BY MOUTH TWICE A DAY. TAKE WITH FOOD 180 tablet 0   nitroGLYCERIN (NITROSTAT) 0.4 MG SL tablet Place 1 tablet (0.4 mg total) under the tongue every 5 (five) minutes as needed for chest pain. 25 tablet 3   pantoprazole (PROTONIX) 20 MG tablet Take 1 tablet (20 mg total) by mouth 2 (two) times daily before a meal. 180 tablet 3   Polyethyl Glycol-Propyl Glycol (SYSTANE) 0.4-0.3 % GEL ophthalmic gel Place 1 application into both eyes.     potassium chloride (KLOR-CON) 10 MEQ tablet TAKE ONE TABLET BY MOUTH ONCE A DAY 90 tablet 1   spironolactone (ALDACTONE) 25 MG tablet Take 25 mg by mouth daily.     traZODone (DESYREL)  50 MG tablet TAKE 1/2 TO 1 TABLET BY MOUTH AT BEDTIMEAS NEEDED FOR SLEEP. 30 tablet 1   warfarin (COUMADIN) 5 MG tablet TAKE 1 TABLET BY MOUTH DAILY EXCEPT TAKE 1/2 TABLET ON MONDAYS, WEDNESDAYS AND FRIDAYS OR AS DIRECTED BY ANTICOAGULATION CLINIC 110 tablet 1   No current facility-administered medications for this visit.    OBJECTIVE: Vitals:   03/25/23 1302  BP: (!) 116/59  Pulse: (!) 56  Resp: 17  Temp: 98.7 F (37.1 C)  SpO2: 96%     Body mass index is 41.17 kg/m.      General: Well-developed, well-nourished, no acute distress. Eyes: Pink conjunctiva, anicteric sclera. HEENT: Normocephalic, moist mucous membranes, clear oropharnyx. Lungs: Clear to auscultation bilaterally. Heart: Regular rate and rhythm. No rubs, murmurs, or gallops. Abdomen: Soft, nontender, nondistended. No organomegaly noted, normoactive bowel sounds. Musculoskeletal: No edema, cyanosis, or clubbing. Neuro: Alert, answering all questions appropriately. Cranial nerves grossly intact. Skin: No rashes or petechiae noted. Psych: Normal affect. Lymphatics: No cervical, calvicular, axillary or inguinal LAD.   LAB RESULTS:  Lab Results  Component Value Date   NA 137 12/16/2022   K 3.8 12/16/2022   CL 102 12/16/2022   CO2 28 12/16/2022   GLUCOSE 94 12/16/2022   BUN 25 (H)  12/16/2022   CREATININE 1.31 (H) 12/16/2022   CALCIUM 9.4 12/16/2022   PROT 6.4 (L) 11/13/2022   ALBUMIN 3.2 (L) 11/13/2022   AST 18 11/13/2022   ALT 13 11/13/2022   ALKPHOS 58 11/13/2022   BILITOT 0.8 11/13/2022   GFRNONAA 60 (L) 11/16/2022   GFRAA 90 03/25/2020    Lab Results  Component Value Date   WBC 8.5 02/11/2023   NEUTROABS 6.4 02/11/2023   HGB 11.7 (L) 03/25/2023   HCT 38.0 03/25/2023   MCV 78.8 (L) 02/11/2023   PLT 341 02/11/2023    Lab Results  Component Value Date   TIBC 442 02/11/2023   TIBC 371 11/13/2022   FERRITIN 33 02/11/2023   FERRITIN 28.6 12/16/2022   IRONPCTSAT 7 (L) 02/11/2023   IRONPCTSAT 11 11/13/2022     STUDIES: No results found.  ASSESSMENT AND PLAN:   Julie Phillips is a 89 y.o. female with pmh of CHF, CAD s/p stent, hyperlipidemia, hypertension, hypothyroidism, A-fib on warfarin was referred to hematology for workup of anemia.  # CKD stage III # Iron deficiency anemia -Anemia likely secondary to iron deficiency and CKD stage III.  Repeat iron panel showed iron deficiency.  Status post IV Venofer completed April 5.  She has noticed significant improvement in her symptoms such as fatigue shortness of breath.  She was also treated with Aranesp injection 100 mcg x 2.  Repeat hemoglobin has improved to 11.7.  I will hold off on further Aranesp injections.  Advised to start oral iron 325 mg 3 times a week.  -She had EGD and colonoscopy in December 2023 which not show any bleeding source.  -Repeat CBC and iron studies in 3 months.  # A-fib s/p cardioversion # CHF # CAD status post stent -Follows with Dr. Mariah Milling.  On warfarin.  Having issues maintaining her INR.  Could not afford NOACs.  Was recently seen by cardiology and no changes made.   Orders Placed This Encounter  Procedures   CBC with Differential/Platelet   Iron and TIBC   Ferritin   No Aranesp today. RTC in 3 months for MD visit, labs, possible Venofer  Patient expressed  understanding and was in agreement with this plan.  She also understands that She can call clinic at any time with any questions, concerns, or complaints.   I spent a total of 30 minutes reviewing chart data, face-to-face evaluation with the patient, counseling and coordination of care as detailed above.  Michaelyn Barter, MD   03/25/2023 3:04 PM

## 2023-03-25 NOTE — Patient Instructions (Signed)
Please start iron pills 325 mg three days a week.

## 2023-04-07 ENCOUNTER — Other Ambulatory Visit: Payer: Self-pay | Admitting: Family Medicine

## 2023-04-07 ENCOUNTER — Ambulatory Visit (INDEPENDENT_AMBULATORY_CARE_PROVIDER_SITE_OTHER): Payer: PPO

## 2023-04-07 DIAGNOSIS — Z7901 Long term (current) use of anticoagulants: Secondary | ICD-10-CM | POA: Diagnosis not present

## 2023-04-07 LAB — POCT INR: INR: 2.5 (ref 2.0–3.0)

## 2023-04-07 MED ORDER — WARFARIN SODIUM 5 MG PO TABS
ORAL_TABLET | ORAL | 1 refills | Status: DC
Start: 2023-04-07 — End: 2023-04-07

## 2023-04-07 MED ORDER — WARFARIN SODIUM 5 MG PO TABS
ORAL_TABLET | ORAL | 1 refills | Status: DC
Start: 2023-04-07 — End: 2023-12-27

## 2023-04-07 NOTE — Progress Notes (Addendum)
Continue 1 tablet daily except take 1/2 tablet on Tuesdays and Fridays. Recheck in 4 weeks.   Pt requested refill of warfarin. First script sent was incorrect sig. Sent in another script with correct sig. Contacted [facility] and spoke with Chastity and advised which script was correct. Chastity readback correct script.

## 2023-04-07 NOTE — Patient Instructions (Addendum)
Pre visit review using our clinic review tool, if applicable. No additional management support is needed unless otherwise documented below in the visit note.  Continue 1 tablet daily except take 1/2 tablet on Tuesdays and Fridays. Recheck in 4 weeks.  

## 2023-04-27 ENCOUNTER — Telehealth: Payer: Self-pay

## 2023-04-27 NOTE — Progress Notes (Signed)
Care Management & Coordination Services Pharmacy Team  Reason for Encounter: Appointment Reminder  Contacted patient to confirm telephone appointment with Al Corpus , PharmD on 05/02/23 at 3:45. Unsuccessful outreach. Left voicemail for patient to return call.  Have you seen any other providers since your last visit with PCP? Yes  Oncology,pulmonology,cardiology,ophthalmology    [name] Rating Drugs:  Medication:  Last Fill: Day Supply  No star medications identified   Care Gaps: Annual wellness visit in last year? Yes  If Diabetic: Last eye exam / retinopathy screening: Last diabetic foot exam:   Al Corpus, PharmD notified  Burt Knack, [facility] Clinical Pharmacy Assistant [phone]

## 2023-04-29 NOTE — Telephone Encounter (Signed)
I sent urgent message to [employer] to see if the ONO has been done yet

## 2023-05-02 ENCOUNTER — Ambulatory Visit: Payer: PPO | Admitting: Pharmacist

## 2023-05-02 ENCOUNTER — Telehealth: Payer: Self-pay

## 2023-05-02 DIAGNOSIS — E785 Hyperlipidemia, unspecified: Secondary | ICD-10-CM

## 2023-05-02 DIAGNOSIS — I25119 Atherosclerotic heart disease of native coronary artery with unspecified angina pectoris: Secondary | ICD-10-CM

## 2023-05-02 MED ORDER — REPATHA SURECLICK 140 MG/ML ~~LOC~~ SOAJ
140.0000 mg | SUBCUTANEOUS | 3 refills | Status: DC
Start: 2023-05-02 — End: 2024-01-12

## 2023-05-02 NOTE — Progress Notes (Signed)
Care Management & Coordination Services Pharmacy Team  Reason for Encounter: Medication Prior Authorization    Spoke with technician with [employer] in pharmacy   Prior Authorization forms to be faxed to [phone] for Repatha for the patient    Al Corpus, PharmD notified  Burt Knack, [facility] Clinical Pharmacy Assistant [phone]

## 2023-05-02 NOTE — Telephone Encounter (Signed)
-----   Message from Kathyrn Sheriff, [facility] sent at 05/02/2023  4:05 PM EDT ----- Regarding: call [employer] Please call [employer] and request prior auth form for Repatha - fax to me: [phone]. The PA forms are not yet available on their website.

## 2023-05-02 NOTE — Progress Notes (Signed)
Care Management & Coordination Services Pharmacy Note  05/02/2023 Name:  Julie Phillips MRN:  [id number] DOB:  [birth year]  Summary: F/U visit -HLD/CAD: LDL 161 (07/2022) is elevated; reviewed new LDL goal < 70 given recent ASCVD (stent to LAD 04/2022). She is intolerant to statins and ezetimibe; she has discussed PCSK9-inhibitors with cardiology previously but did not pursue due to cost concerns; discussed again with pt today and she is willing to try Repatha if cost will not be a concern  Recommendations/Changes made from today's visit: -Ordered Repatha Sureclick 140 mg inj every 14 days -Enrolled in [employer] to cover Repatha copays -Schedule OV for Repatha training when pt returns from vacation (2-3 wks)  Follow up plan: -Health Concierge will call patient 2 weeks to schedule OV with PharmD -PCP 05/03/23; hematology 06/24/23    Subjective: Julie Phillips is an 89 y.o. year old female who is a primary patient of Para March, Dwana Curd, MD.  The care coordination team was consulted for assistance with disease management and care coordination needs.    Engaged with patient by telephone for follow up visit.  Recent office visits: 11/19/22 Dr Para March OV: hospital f/u - symptomatic anemia.   10/01/22 Dr Para March OV: trouble sleeping, daughter died recently. Rx trazodone.  07-31-2022 Dr Para March OV: annual - switch omeprazole to pantoprazole 20 mg (clopidogrel DDI)  Recent consult visits: 03/25/23 Dr Alena Bills (Heme/onc): IDA - HGB 11.7. s/p iron infusions. hold further aranesp. Start oral iron 325 mg TIW.  02/11/23 Dr Alena Bills (Heme/onc): f/u - started aranesp 12/30/22. HGB 9 now.   01/28/23 NP Clent Ridges (Pulm): HST 11/2022 w/ moderate OSA. Hesitant to wear cpap. Goal weight loss 10-15 lbs.   01/03/23 Dr Mariah Milling (Cardiology): f/u - pt concerned about PCSK9 cost. F/U 6 months.  12/24/22 Dr Alena Bills (Heme/onc): anemia - likely of chronic disease. Consider Aranesp.  11/26/22 NP Hammock (Cardiology): f/u -  pt reports SE with pantoprazole, advised to reduce to once daily (do not stop).  11/12/22 NP Hammock (Cardiology): SOB w/ difficulty swallowing - sent to ED for eval.  10/22/22 NP Hammock (Cardiology): f/u - low pulse 50s. Hold PM dose of metoprolol.  10/06/22 NP Clent Ridges (Pulmonology): sleep consult - ordered home sleep test. Rx famotidine 20 mg HS for wheezing/underlying reflux.  09/30/22 NP Hammock (Cardiology): SOB - referral to pulmonary for OSA eval. Discuss PCSK9 at f/u.  09/21/22 NP Hammock (Cardiology): new SOB, PND - labs and xray. Discuss PCSK9 next visit. BNP elevated, increase Lasix to 40 mg daily and schedule CT to r/o PE - negative PE.  06/18/22 Dr Mariah Milling (Cardiology): f/u - start ezetimibe 10 mg. D/c aspirin, metolazone.   Hospital visits: 12/03/22 ED visit [facility] fall -   11/12/22 - 11/16/22 Admission [facility]): symptomatic anemia - given 1 u PRBC. GI workup unremarkable. Hold plavix and continue warfarin.   Objective:  Lab Results  Component Value Date   CREATININE 1.31 (H) 12/16/2022   BUN 25 (H) 12/16/2022   GFR 36.46 (L) 12/16/2022   GFRNONAA 60 (L) 11/16/2022   GFRAA 90 03/25/2020   NA 137 12/16/2022   K 3.8 12/16/2022   CALCIUM 9.4 12/16/2022   CO2 28 12/16/2022   GLUCOSE 94 12/16/2022    Lab Results  Component Value Date/Time   GFR 36.46 (L) 12/16/2022 11:01 AM   GFR 48.68 (L) 11/25/2022 10:26 AM    Last diabetic Eye exam: No results found for: "HMDIABEYEEXA"  Last diabetic Foot exam: No results found for: "HMDIABFOOTEX"   Lab  Results  Component Value Date   CHOL 238 (H) 08/12/2022   HDL 54.40 08/12/2022   LDLCALC 161 (H) 08/12/2022   LDLDIRECT 152.6 12/31/2013   TRIG 112.0 08/12/2022   CHOLHDL 4 08/12/2022       Latest Ref Rng & Units 11/13/2022    5:56 AM 04/01/2022   11:10 AM 02/08/2022    5:19 PM  Hepatic Function  Total Protein 6.5 - 8.1 g/dL 6.4  7.4  7.0   Albumin 3.5 - 5.0 g/dL 3.2  3.5  3.4   AST 15 - 41 U/L 18  15  14    ALT 0 - 44  U/L 13  10  8    Alk Phosphatase 38 - 126 U/L 58  91  85   Total Bilirubin 0.3 - 1.2 mg/dL 0.8  0.8  0.7   Bilirubin, Direct 0.0 - 0.2 mg/dL  0.2      Lab Results  Component Value Date/Time   TSH 1.568 12/24/2022 11:49 AM   TSH 2.34 08/12/2022 11:09 AM   TSH 2.542 04/01/2022 11:10 AM   TSH 1.26 09/14/2021 09:55 AM       Latest Ref Rng & Units 03/25/2023    1:03 PM 03/03/2023   12:59 PM 02/11/2023   12:48 PM  CBC  WBC 4.0 - 10.5 K/uL   8.5   Hemoglobin 12.0 - 15.0 g/dL 16.1  9.6  9.0   Hematocrit 36.0 - 46.0 % 38.0  32.4  30.9   Platelets 150 - 400 K/uL   341    Iron/TIBC/Ferritin/ %Sat    Component Value Date/Time   IRON 32 02/11/2023 1248   TIBC 442 02/11/2023 1248   FERRITIN 33 02/11/2023 1248   IRONPCTSAT 7 (L) 02/11/2023 1248    Lab Results  Component Value Date/Time   VD25OH 35 07/10/2021 03:08 PM   VD25OH 31.42 08/04/2020 11:47 AM   VD25OH 34.70 07/09/2019 11:26 AM   VITAMINB12 2,032 (H) 11/13/2022 04:37 PM    Clinical ASCVD: Yes  The ASCVD Risk score (Arnett DK, et al., 2019) failed to calculate for the following reasons:   The 2019 ASCVD risk score is only valid for ages 82 to 64    CHA2DS2/VAS Stroke Risk Points  Current as of yesterday     6 >= 2 Points: High Risk    Points Metrics  1 Has Congestive Heart Failure:  Yes    Current as of yesterday  1 Has Vascular Disease:  Yes    Current as of yesterday  1 Has Hypertension:  Yes    Current as of yesterday  2 Age:  12    Current as of yesterday  0 Has Diabetes:  No    Current as of yesterday  0 Had Stroke:  No  Had TIA:  No  Had Thromboembolism:  No    Current as of yesterday  1 Female:  Yes    Current as of yesterday        06/24/2022   12:06 PM 04/15/2022   10:53 AM 07/09/2019   11:23 AM  Depression screen PHQ 2/9  Decreased Interest 0 0 0  Down, Depressed, Hopeless 0 0 0  PHQ - 2 Score 0 0 0  Altered sleeping   0  Tired, decreased energy   0  Change in appetite   0  Feeling bad or failure  about yourself    0  Trouble concentrating   0  Moving slowly or fidgety/restless   0  Suicidal thoughts   0  PHQ-9 Score   0     Social History   Tobacco Use  Smoking Status Former   Packs/day: 2.00   Years: 20.00   Additional pack years: 0.00   Total pack years: 40.00   Types: Cigarettes   Quit date: 12/14/1971   Years since quitting: 51.4  Smokeless Tobacco Never   BP Readings from Last 3 Encounters:  03/25/23 (!) 116/59  03/18/23 (!) 114/58  03/14/23 (!) 109/39   Pulse Readings from Last 3 Encounters:  03/25/23 (!) 56  03/18/23 (!) 57  03/14/23 61   Wt Readings from Last 3 Encounters:  03/25/23 217 lb 14.4 oz (98.8 kg)  02/11/23 219 lb 9.6 oz (99.6 kg)  01/28/23 222 lb 9.6 oz (101 kg)   BMI Readings from Last 3 Encounters:  03/25/23 41.17 kg/m  02/11/23 41.49 kg/m  01/28/23 42.06 kg/m    Allergies  Allergen Reactions   Statins     Myalgias   Vitamin K And Related Shortness Of Breath   Wheat     Throat swelling   Ezetimibe Other (See Comments)    Extreme fatigue   Jardiance [Empagliflozin] Other (See Comments)    Dry mouth, "gasping for breath"   Amlodipine     hematuria   Eucalyptus Oil    Lisinopril     Unrecalled BP med- possibly lisinopril- intolerant, had cough on the medicine- changed to cozaar   Rice Nausea And Vomiting    Medications Reviewed Today     Reviewed by Kathyrn Sheriff, [facility] (Pharmacist) on 05/02/23 at 1601  Med List Status: <None>   Medication Order Taking? Sig Documenting Provider Last Dose Status Informant  amiodarone (PACERONE) 100 MG tablet [id number] Yes Take 1 tablet (100 mg total) by mouth daily. Charlsie Quest, NP Taking Active   clopidogrel (PLAVIX) 75 MG tablet [id number] Yes Take 75 mg by mouth daily. [provider] Taking Active   diltiazem (CARDIZEM CD) 120 MG 24 hr capsule [id number] Yes Take 1 capsule (120 mg total) by mouth daily. Antonieta Iba, MD Taking Active Self  Evolocumab (REPATHA  SURECLICK) 140 MG/ML Ivory Broad [id number]  Inject 140 mg into the skin every 14 (fourteen) days. Joaquim Nam, MD  Active   furosemide (LASIX) 20 MG tablet [id number] Yes Take 1 tablet (20 mg total) by mouth 2 (two) times daily. Antonieta Iba, MD Taking Active   gabapentin (NEURONTIN) 100 MG capsule [id number] Yes TAKE 1 CAPSULE BY MOUTH 3 TIMES DAILY ASNEEDED Joaquim Nam, MD Taking Active Self  levothyroxine (SYNTHROID) 100 MCG tablet [id number] Yes TAKE 1 TABLET BY MOUTH ONCE A DAY BEFOREBREAKFAST. Joaquim Nam, MD Taking Active   LOSARTAN POTASSIUM PO [id number] Yes Apply 0.8 % to eye daily. Compound solution [provider] Taking Active   metoprolol succinate (TOPROL-XL) 50 MG 24 hr tablet [id number] Yes TAKE ONE TABLET BY MOUTH TWICE A DAY. TAKE WITH FOOD Mariah Milling, Tollie Pizza, MD Taking Active   nitroGLYCERIN (NITROSTAT) 0.4 MG SL tablet [id number] Yes Place 1 tablet (0.4 mg total) under the tongue every 5 (five) minutes as needed for chest pain. Antonieta Iba, MD Taking Active Self  pantoprazole (PROTONIX) 20 MG tablet [id number] Yes Take 1 tablet (20 mg total) by mouth 2 (two) times daily before a meal. Hammock, Sheri, NP Taking Active   Polyethyl Glycol-Propyl Glycol (SYSTANE) 0.4-0.3 % GEL ophthalmic gel [id number] Yes Place 1 application into both eyes. [provider] Taking Active Self  potassium chloride (KLOR-CON) 10 MEQ tablet [id number] Yes TAKE ONE TABLET BY MOUTH ONCE A DAY Joaquim Nam, MD Taking Active   spironolactone (ALDACTONE) 25 MG tablet [id number] Yes Take 25 mg by mouth daily. [provider] Taking Active   traZODone (DESYREL) 50 MG tablet [id number] Yes TAKE ONE HALF (1/2) TO ONE TABLET BY MOUTH AT BEDTIME AS NEEDED FOR SLEEP. Joaquim Nam, MD Taking Active   warfarin (COUMADIN) 5 MG tablet [id number] Yes TAKE 1 TABLET BY MOUTH DAILY EXCEPT TAKE 1/2 TABLET ON TUESDAYS AND FRIDAYS OR AS DIRECTED BY ANTICOAGULATION CLINIC Joaquim Nam,  MD Taking Active             SDOH:  (Social Determinants of Health) assessments and interventions performed: No SDOH Interventions    Flowsheet Row Telephone from 11/18/2022 in [facility] Care Coordination Chronic Care Management from 01/15/2022 in [facility] Clinical Support from 07/09/2019 in [facility] [place]  SDOH Interventions     Food Insecurity Interventions Intervention Not Indicated Intervention Not Indicated --  Housing Interventions Intervention Not Indicated -- --  Transportation Interventions Intervention Not Indicated -- --  Utilities Interventions Intervention Not Indicated -- --  Depression Interventions/Treatment  -- -- KGM0-1 Score <4 Follow-up Not Indicated  Financial Strain Interventions -- Intervention Not Indicated --       Medication Assistance: Repatha - [employer] grant  04/02/23 - 03/31/24 CARD NO. [id number]  BIN [id number]   PCN [id number]   PC GROUP [id number]  Medication Access: Within the past 30 days, how often has patient missed a dose of medication? 0 Is a pillbox or other method used to improve adherence? Yes  Factors that may affect medication adherence? no barriers identified Are meds synced by current pharmacy? Yes  Are meds delivered by current pharmacy? Yes  Does patient experience delays in picking up medications due to transportation concerns? No   Upstream Services Reviewed: Is patient disadvantaged to use [facility]?: Yes  Current Rx insurance plan: [employer] Name and location of Current pharmacy:  [facility] - [place], [place] - [address] [place] [address] Phone: [phone] Fax: [phone]  [facility] services reviewed with patient today?: No  Patient requests to transfer care to [facility] Pharmacy?: No  Reason patient declined to change pharmacies: Disadvantaged due to insurance/mail  order  Compliance/Adherence/Medication fill history: Care Gaps: None  [name]-Rating Drugs: None   ASSESSMENT / PLAN  Hypertension / Heart Failure (BP goal <140/90) -Controlled - BP at home is at goal, pt reports no change in BP readings since stopping losartan in January -Home BP: 130-140/60s; 134/69 -Last ejection fraction: 65-70% (Date: 02/10/22) -HF type: HFpEF (EF > 50%); NYHA Class: II (slight limitation of activity) -Current treatment: Losartan 100 mg daily PM - not taking. Removed from list. Metoprolol succinate 50 mg BID - Appropriate, Effective, Safe, Accessible Furosemide 20 mg -2 tablets daily - Appropriate, Effective, Safe, Accessible Diltiazem CD 120 mg daily - Appropriate, Effective, Safe, Accessible -Medications previously tried: amlodipine, lisinopril, Jardiance, spironolactone -Educated on BP goals and benefits of medications for prevention of heart attack, stroke and kidney damage; -Educated on Benefits of medications for managing symptoms and prolonging life Importance of weighing daily;  -Counseled to monitor BP and weight at home daily -Recommended to continue current medication  Atrial Fibrillation (Goal: prevent stroke and major bleeding) -Controlled -  INR 2.2 last week, pt has hx of fluctuating INR < 2  and > 3 for many months -CHADSVASC: 4; Dx 2007, recurrence 01/2022 and started on warfarin at that time -Current treatment: Metoprolol succinate 50 mg daily - Appropriate, Effective, Safe, Accessible Diltiazem CD 120 mg daily -Appropriate, Effective, Safe, Accessible Amiodarone 200 mg daily -Appropriate, Effective, Safe, Accessible Warfarin 5 mg as directed -Appropriate, Query Effective -Medications previously tried: warfarin (stopped due to frequent falls) -Counseled on importance of adherence to anticoagulant exactly as prescribed; bleeding risk associated with warfarin and importance of self-monitoring for signs/symptoms of bleeding; importance of regular  laboratory monitoring; -Reviewed anticoagulation options - pt had declined Factor Xa inhibitors due to cost -Recommended to continue current medication  Hyperlipidemia / CAD (LDL goal < 130) -Uncontrolled - LDL 161 (07/2022) above goal; she has discussed PCSK9-inhibitor with cardiology previously but did not start due to cost concerns; [employer] grant is open and pt is eligible for free copays -Coronary CT 04/2022 - Ca score 902 (87th percentile). Stent to LAD 04/2022. -Has taken NTG once (before cath) -Current treatment: Clopidogrel 75 mg daily - Appropriate, Effective, Safe, Accessible Nitroglycerin 0.4 mg SL prn -Appropriate, Effective, Safe, Accessible -Medications previously tried: ezetimibe, statins, clopidogrel -Educated on Cholesterol goals - LDL goal < 70 now that she has established ASCVD. Discussed difference between total cholesterol and LDL, importance of achieving LDL goal to prevent ASCVD recurrence; discussed PCSK9-inhibitors, pt is willing to try -Start Repatha 140 mg injection q14 days; enrolled in [employer] grant for copay assistance  Anemia (Goal: HGB > 11) -Improving - HGB 9.6 (03/03/23) improved from 9.0; TSAT 7 (02/11/23) -pt reports iron infusions have improved energy levels significantly;  -Follows with hematology -Current treatment  Aranesp (12/30/22 - )- Appropriate, Effective, Safe, Accessible Iron infusions (3/8, 3/15, 3/22, 4/1) - Appropriate, Effective, Safe, Accessible -Medications previously tried: n/a  -Recommended to continue current medication  Osteopenia (Goal prevent fractures) -Not ideally controlled -pt is not taking calcium or vitamin D supplements -Last DEXA Scan: 02/2015   T-Score femoral neck: -1.6  T-Score forearm radius: 0.0  10-year probability of major osteoporotic fracture: 12.2%  10-year probability of hip fracture: 2.2% -Patient is not a candidate for pharmacologic treatment -Current treatment  None -Medications previously tried: none   -Recommend weight-bearing and muscle strengthening exercises for building and maintaining bone density.  Health Maintenance -Vaccine gaps: Flu, TDAP, Shingrix -hx GERD/GI bleeding: on pantoprazole 20 mg BID -HypoTH: on levothyroxine 100 mcg  Al Corpus, PharmD, [place], CPP Clinical Pharmacist Practitioner [facility] at [facility] [phone]

## 2023-05-03 ENCOUNTER — Ambulatory Visit (INDEPENDENT_AMBULATORY_CARE_PROVIDER_SITE_OTHER): Payer: PPO | Admitting: Family Medicine

## 2023-05-03 ENCOUNTER — Telehealth: Payer: Self-pay | Admitting: Family Medicine

## 2023-05-03 ENCOUNTER — Encounter: Payer: Self-pay | Admitting: Family Medicine

## 2023-05-03 VITALS — BP 110/78 | HR 66 | Temp 97.9°F | Ht 61.0 in | Wt 216.0 lb

## 2023-05-03 DIAGNOSIS — J0100 Acute maxillary sinusitis, unspecified: Secondary | ICD-10-CM | POA: Diagnosis not present

## 2023-05-03 MED ORDER — AMOXICILLIN-POT CLAVULANATE 875-125 MG PO TABS: 1.0000 | ORAL_TABLET | Freq: Two times a day (BID) | ORAL | 0 refills | Status: DC

## 2023-05-03 NOTE — Telephone Encounter (Signed)
Received msg from PCP that pt was seen today in office and prescribed Augmentin x 10 days. He advised pt to reduce warfarin dosing to take 1/2 tablet while taking abx. He inquired if pt should keep coumadin clinic apt on 5/23. Advised apt should be kept.   Contacted pt and advised to keep coumadin clinic apt on 5/23. Advised pt since she will have INR checked in 2 days to continue normal dosing of warfarin until recheck of INR in two days. Pt has started the abx already. Advised if any s/s of abnormal bruising or bleeding to call 911 or go to the ER. Pt verbalized understanding.

## 2023-05-03 NOTE — Telephone Encounter (Signed)
Patient requested a call back from [facility]. She said she was returning a call. Best callback is [phone].

## 2023-05-03 NOTE — Progress Notes (Unsigned)
Still on coumadin.  Sx started about 9 days ago.  No fevers.  Runny nose.  Frontal HA.  Some occ ear pain.  No ST now but had ST initially.   Taking OTC cold medicine.  Can still take a deep breath.  Minimal cough.  Voice is altered.    Meds, vitals, and allergies reviewed.   ROS: Per HPI unless specifically indicated in ROS section   GEN: nad, alert and oriented HEENT: mucous membranes moist, tm w/o erythema, nasal exam w/o erythema, clear discharge noted,  OP with cobblestoning, max sinuses ttp B NECK: supple w/o LA CV: rrr.   PULM: ctab, no inc wob EXT: no edema SKIN: well perfused.

## 2023-05-03 NOTE — Telephone Encounter (Signed)
Noted. Thanks.

## 2023-05-03 NOTE — Patient Instructions (Signed)
Start augmentin.  In the meantime, cut coumadin back to 1/2 tab a day while on the antibiotics and I'll update [place].  She may want to change your coumadin clinic appointment.    Take care.  Glad to see you.

## 2023-05-04 DIAGNOSIS — H18891 Other specified disorders of cornea, right eye: Secondary | ICD-10-CM | POA: Diagnosis not present

## 2023-05-04 DIAGNOSIS — J0100 Acute maxillary sinusitis, unspecified: Secondary | ICD-10-CM | POA: Insufficient documentation

## 2023-05-04 DIAGNOSIS — H18421 Band keratopathy, right eye: Secondary | ICD-10-CM | POA: Diagnosis not present

## 2023-05-04 NOTE — Assessment & Plan Note (Signed)
Nontoxic.  Okay for outpatient follow-up.  Supportive care.  Start Augmentin.  See following anticoagulation clinic notes.

## 2023-05-05 ENCOUNTER — Ambulatory Visit (INDEPENDENT_AMBULATORY_CARE_PROVIDER_SITE_OTHER): Payer: PPO

## 2023-05-05 DIAGNOSIS — Z7901 Long term (current) use of anticoagulants: Secondary | ICD-10-CM

## 2023-05-05 LAB — POCT INR: INR: 2.1 (ref 2.0–3.0)

## 2023-05-05 NOTE — Patient Instructions (Addendum)
Pre visit review using our clinic review tool, if applicable. No additional management support is needed unless otherwise documented below in the visit note.  Continue 1 tablet daily except take 1/2 tablet on Tuesdays and Thursdays. Recheck in 5 weeks.   

## 2023-05-05 NOTE — Progress Notes (Signed)
Pt was prescribed Augmentin 2 days ago and will take for another 8 days for URI. Continue 1 tablet daily except take 1/2 tablet on Tuesdays and Thursdays. Recheck in 5 weeks per pt request.

## 2023-05-12 ENCOUNTER — Telehealth: Payer: Self-pay

## 2023-05-12 NOTE — Telephone Encounter (Signed)
PA initiated via Covermymeds; KEY: [id number]. Awaiting determination.

## 2023-05-13 ENCOUNTER — Other Ambulatory Visit: Payer: Self-pay | Admitting: Cardiovascular Disease

## 2023-05-13 NOTE — Telephone Encounter (Signed)
I spoke with pt to verify Spironolactone 25 mg tablet.  Pt currently taking Spironolactone 25 mg tablet qd.  Please advise if ok to refill historical medication.

## 2023-05-18 NOTE — Telephone Encounter (Signed)
Have not received response- I believe [employer] is having problems receiving PAs from [employer].

## 2023-05-23 NOTE — Telephone Encounter (Signed)
We have finally received the ONO results and they have been faxed to the [facility] for Julie Phillips to review

## 2023-05-23 NOTE — Telephone Encounter (Signed)
I called [employer] and verified with Melissa that due to pt having OSA - even if it is suspected OSA - pt has to have a titration study in the sleep lab to have O2 level monitored.  She states does not matter if she is claustrophobic and doesn't want to wear a cpap, she still has to go for the titration study per Medicare guidelines.  This will be the same with any dme company.  Her only alternative would be to pay for O2 out-of-pocket.

## 2023-05-23 NOTE — Telephone Encounter (Signed)
ONO 05/11/23 on room air>> SpO2 low 72% (basal 93%).  Patient spent 18 minutes with O2 less than 88%.  Qualifies for nocturnal oxygen.  She is intolerant to CPAP due to claustrophobia.  She has a history of afib and heart failure.  Needs to start oxygen 2 L at bedtime.   Cordelia Pen do we need a new order, if so please send back to Clyda Greener or Freida Busman to place order

## 2023-05-23 NOTE — Telephone Encounter (Signed)
Amy can you please call patient and let her know overnight oximetry test showed that her oxygen level went into the 70s when she slept. She spent 18 minutes with O2 <88%. Can you ask if she open at all to trying CPAP? If so please order CPAP titration study in lab. If refusing please re-enter nocturnal oxygen 2L order and associated it with chronic respiratory failure. If we still get push back I would either write a letter to her insurance stating that patient has severe claustrophobia unable to tolerate CPAP and needs oxygen due to her underlying cardiac history. Or I need to do a peer-to peer.

## 2023-05-23 NOTE — Telephone Encounter (Signed)
Can you look for these today and leave on my desk

## 2023-05-24 ENCOUNTER — Other Ambulatory Visit: Payer: Self-pay

## 2023-05-24 ENCOUNTER — Other Ambulatory Visit (HOSPITAL_COMMUNITY): Payer: Self-pay

## 2023-05-24 DIAGNOSIS — G4734 Idiopathic sleep related nonobstructive alveolar hypoventilation: Secondary | ICD-10-CM

## 2023-05-24 NOTE — Telephone Encounter (Signed)
I ran a test claim to see if a determination has been made, just not yet communicated. It still shows it needs a PA meaning likely the request never sent to the plan due to a hang up in [facility].   This is a known issue with [employer], that they are aware of, but have yet to find any solution for. I will go ahead and manually fax the request to them now.

## 2023-05-24 NOTE — Telephone Encounter (Signed)
Julie Phillips - Pt does not have to be open to wearing a cpap - she just needs the titration study to show her O2 decreased and does not necessarily have OSA.

## 2023-05-24 NOTE — Telephone Encounter (Signed)
Ok, Amy can you place order for CPAP titration study and write in note to qualify for nocturnal oxygen re: nocturnal hypoxia

## 2023-05-24 NOTE — Telephone Encounter (Signed)
Amy is off this week - will route message to Kendal Hymen since she is working with [employer] today.

## 2023-05-24 NOTE — Telephone Encounter (Signed)
OK - so she doesn't have to wear the cpap, just needs the titration study for insurance purposes.  Basically she just needs to be able to tolerate the titration study that one night and study can be used to qualify her for O2.

## 2023-05-24 NOTE — Telephone Encounter (Signed)
She has OSA but is unwilling to wear CPAP due to severe claustrophobia

## 2023-05-24 NOTE — Telephone Encounter (Signed)
Order has been placed for CPAP titration. 

## 2023-06-01 NOTE — Telephone Encounter (Signed)
Order was placed.  She is a Educational psychologist pt and Synetta Fail faxed the order to the Sleep Lab [place].  They contact the patient's to schedule sleep studies.  I would assume since it was just sent on 6/11 that it hasn't been done yet.

## 2023-06-02 ENCOUNTER — Encounter: Payer: Self-pay | Admitting: Primary Care

## 2023-06-07 ENCOUNTER — Other Ambulatory Visit: Payer: Self-pay | Admitting: Family Medicine

## 2023-06-07 NOTE — Telephone Encounter (Signed)
Patient has not been seen to review thyroid in a while ok to fill?

## 2023-06-08 NOTE — Telephone Encounter (Signed)
TSH wnl this year.  Sent. Thanks.

## 2023-06-09 ENCOUNTER — Ambulatory Visit: Payer: PPO

## 2023-06-10 ENCOUNTER — Telehealth: Payer: Self-pay | Admitting: Primary Care

## 2023-06-10 NOTE — Telephone Encounter (Signed)
I called and spoke with Raynelle Fanning with [facility] she stated the needed the PA for the Cpap Titration Sleep Study  that was sent to [employer].  I explained that I have no idea which location they would have faxed the PA and I would have to call [employer] to get a copy of the PA. Raynelle Fanning stated she would just call [employer] since they were the ones trying to get the approval

## 2023-06-10 NOTE — Telephone Encounter (Signed)
Julie Phillips states needs authorization letter for CPAP machine faxed to them. [employer], Fax number is [phone]. Julie Phillips phone number is [phone].

## 2023-06-15 ENCOUNTER — Ambulatory Visit: Payer: PPO | Attending: Otolaryngology

## 2023-06-15 DIAGNOSIS — G4734 Idiopathic sleep related nonobstructive alveolar hypoventilation: Secondary | ICD-10-CM | POA: Diagnosis not present

## 2023-06-15 DIAGNOSIS — Z6841 Body Mass Index (BMI) 40.0 and over, adult: Secondary | ICD-10-CM | POA: Insufficient documentation

## 2023-06-15 DIAGNOSIS — I10 Essential (primary) hypertension: Secondary | ICD-10-CM | POA: Diagnosis not present

## 2023-06-15 DIAGNOSIS — R0683 Snoring: Secondary | ICD-10-CM | POA: Diagnosis not present

## 2023-06-15 DIAGNOSIS — E669 Obesity, unspecified: Secondary | ICD-10-CM | POA: Diagnosis not present

## 2023-06-15 DIAGNOSIS — G4719 Other hypersomnia: Secondary | ICD-10-CM | POA: Diagnosis not present

## 2023-06-15 DIAGNOSIS — R0989 Other specified symptoms and signs involving the circulatory and respiratory systems: Secondary | ICD-10-CM | POA: Insufficient documentation

## 2023-06-15 DIAGNOSIS — I4891 Unspecified atrial fibrillation: Secondary | ICD-10-CM | POA: Diagnosis not present

## 2023-06-17 ENCOUNTER — Ambulatory Visit
Admission: RE | Admit: 2023-06-17 | Discharge: 2023-06-17 | Disposition: A | Discharge status: Home / Self Care | Payer: PPO | Source: Ambulatory Visit | Attending: Urgent Care | Admitting: Urgent Care

## 2023-06-17 ENCOUNTER — Telehealth: Payer: Self-pay | Admitting: Family Medicine

## 2023-06-17 VITALS — BP 145/68 | HR 64 | Temp 97.8°F | Resp 16

## 2023-06-17 DIAGNOSIS — N3001 Acute cystitis with hematuria: Secondary | ICD-10-CM | POA: Diagnosis not present

## 2023-06-17 DIAGNOSIS — R300 Dysuria: Secondary | ICD-10-CM | POA: Diagnosis not present

## 2023-06-17 LAB — POCT URINALYSIS DIP (MANUAL ENTRY)
Bilirubin, UA: NEGATIVE
Glucose, UA: NEGATIVE mg/dL
Nitrite, UA: NEGATIVE
Spec Grav, UA: 1.02 (ref 1.010–1.025)
Urobilinogen, UA: 1 E.U./dL
pH, UA: 7 (ref 5.0–8.0)

## 2023-06-17 MED ORDER — CEPHALEXIN 500 MG PO CAPS
500.00 mg | ORAL_CAPSULE | Freq: Four times a day (QID) | ORAL | 0 refills | Status: AC
Start: 2023-06-17 — End: 2023-06-22

## 2023-06-17 NOTE — Telephone Encounter (Signed)
Patient contacted the office stating she thinks she may have a UTI. Asked if there was something Dr. Para March could send to help with symptoms. Advised patient I would send in message to ask, but they may suggest she come into the office to be seen before prescribing something. Please advise.

## 2023-06-17 NOTE — Telephone Encounter (Addendum)
Dysuria started about 2 weeks ago while at the beach. "Feels like insides are going to fall out when peeing. Stops soon after". Has been drinking cranberry juice and water. We only have 4 providers today and they are full. I made her an appt at the [facility] on [address] in [place] at 315 today.

## 2023-06-17 NOTE — ED Provider Notes (Addendum)
Renaldo Fiddler    CSN: [id number] Arrival date & time: 06/17/23  1513      History   Chief Complaint Chief Complaint  Patient presents with   Dysuria    HPI Julie Phillips is a 89 y.o. female.    Dysuria   Presents with dysuria, urgency, hesitancy x 2 weeks.  She denies abdominal pain, back pain, nausea/vomiting/diarrhea.  Denies fever.  Past Medical History:  Diagnosis Date   Arrhythmia    atrial fibrillation   CHF (congestive heart failure) (HCC)    Chronic heart failure with preserved ejection fraction (HFpEF) (HCC)    a. 01/2022 Echo: EF 65-70%, no rwma, mild LVH, GrII DD, nl RV fxn, mildly dil LA, mild MR, ao scelrosis.   Fall    GERD (gastroesophageal reflux disease)    History of chicken pox    Hx of colonic polyps    Hyperlipidemia    Hypertension    Hypothyroidism    Keratopathy 2018   R eye   LVH (left ventricular hypertrophy)    Persistent atrial fibrillation (HCC)    a. Initially dx in 2012; b. 01/2022 recurrent AF rvr noted-->warfarin added (CHA2DS2VASc = 5); c. 03/2022 Amio added-->DCCV x 1 (150J).    Patient Active Problem List   Diagnosis Date Noted   Acute non-recurrent maxillary sinusitis 05/04/2023   Iron deficiency anemia 02/11/2023   Sleep apnea 01/31/2023   Nocturnal hypoxia 01/31/2023   CKD (chronic kidney disease) stage 3, GFR 30-59 ml/min (HCC) 12/24/2022   Anemia in chronic kidney disease (CKD) 11/13/2022   GI bleed 11/12/2022   Suspected sleep apnea 10/06/2022   Wheezing 10/06/2022   Grief 10/03/2022   Vision changes 07/04/2022   Coronary artery disease involving native coronary artery of native heart with angina pectoris (HCC) 05/07/2022   Effort angina 05/06/2022   Unstable angina (HCC) 04/26/2022   Atrial fibrillation (HCC) 04/02/2022   Acute on chronic diastolic CHF (congestive heart failure) (HCC) 04/01/2022   Atrial fibrillation with RVR (HCC) 02/08/2022   Tremor of both hands 07/12/2021   GERD (gastroesophageal  reflux disease) 07/18/2019   Hyperglycemia 07/09/2018   Vitamin D deficiency 07/09/2018   Sciatica of left side 11/13/2017   Umbilical abnormality 11/13/2017   Healthcare maintenance 06/28/2017   Toenail avulsion 06/28/2017   DNR (do not resuscitate) 06/27/2017   Band keratopathy 01/16/2017   Risk for falls 06/21/2016   Rotator cuff disorder 12/10/2015   Shortness of breath 12/10/2015   Osteopenia 02/13/2015   Advance care planning 01/15/2015   Food allergy 07/09/2013   Medicare annual wellness visit, subsequent 12/26/2012   Knee pain 07/20/2012   Rash 04/11/2012   Hypothyroid 04/11/2012   LVH (left ventricular hypertrophy) 01/17/2012   Chronic atrial fibrillation with RVR (HCC) 01/06/2012   Obesity 01/06/2012   Essential hypertension 01/06/2012    Past Surgical History:  Procedure Laterality Date   CARDIOVASCULAR STRESS TEST  2011   normal    CARDIOVERSION N/A 04/02/2022   Procedure: CARDIOVERSION;  Surgeon: Antonieta Iba, MD;  Location: [facility];  Service: Cardiovascular;  Laterality: N/A;   COLONOSCOPY WITH PROPOFOL N/A 11/16/2022   Procedure: COLONOSCOPY WITH PROPOFOL;  Surgeon: Regis Bill, MD;  Location: [facility];  Service: Endoscopy;  Laterality: N/A;   CORONARY STENT INTERVENTION N/A 05/06/2022   Procedure: CORONARY STENT INTERVENTION;  Surgeon: Iran Ouch, MD;  Location: [facility];  Service: Cardiovascular;  Laterality: N/A;   ESOPHAGOGASTRODUODENOSCOPY (EGD) WITH PROPOFOL N/A 11/14/2022  Procedure: ESOPHAGOGASTRODUODENOSCOPY (EGD) WITH PROPOFOL;  Surgeon: Regis Bill, MD;  Location: [facility];  Service: Endoscopy;  Laterality: N/A;   keratopathy     LEFT HEART CATH AND CORONARY ANGIOGRAPHY N/A 05/06/2022   Procedure: LEFT HEART CATH AND CORONARY ANGIOGRAPHY;  Surgeon: Iran Ouch, MD;  Location: [facility];  Service: Cardiovascular;  Laterality: N/A;    OB History   No obstetric history on file.       Home Medications    Prior to Admission medications   Medication Sig Start Date End Date Taking? Authorizing Provider  amiodarone (PACERONE) 100 MG tablet Take 1 tablet (100 mg total) by mouth daily. 11/17/22   Charlsie Quest, NP  amoxicillin-clavulanate (AUGMENTIN) 875-125 MG tablet Take 1 tablet by mouth 2 (two) times daily. 05/03/23   Joaquim Nam, MD  clopidogrel (PLAVIX) 75 MG tablet Take 75 mg by mouth daily. 11/20/22   [provider]  diltiazem (CARDIZEM CD) 120 MG 24 hr capsule TAKE ONE CAPSULE BY MOUTH ONCE DAILY 05/13/23   Antonieta Iba, MD  Evolocumab (REPATHA SURECLICK) 140 MG/ML SOAJ Inject 140 mg into the skin every 14 (fourteen) days. 05/02/23   Joaquim Nam, MD  furosemide (LASIX) 20 MG tablet Take 1 tablet (20 mg total) by mouth 2 (two) times daily. 01/03/23   Antonieta Iba, MD  gabapentin (NEURONTIN) 100 MG capsule TAKE 1 CAPSULE BY MOUTH 3 TIMES DAILY ASNEEDED 01/29/21   Joaquim Nam, MD  levothyroxine (SYNTHROID) 100 MCG tablet TAKE ONE TAB BY MOUTH ONCE DAILY. TAKE ON AN EMPTY STOMACH WITH A GLASS OF WATER ATLEAST 30-60 MINUTES BEFORE BREAKFAST 06/08/23   Joaquim Nam, MD  LOSARTAN POTASSIUM PO Apply 0.8 % to eye daily. Compound solution 12/15/22   [provider]  metoprolol succinate (TOPROL-XL) 50 MG 24 hr tablet TAKE ONE TABLET BY MOUTH TWICE A DAY. TAKE WITH FOOD 03/07/23   Antonieta Iba, MD  nitroGLYCERIN (NITROSTAT) 0.4 MG SL tablet Place 1 tablet (0.4 mg total) under the tongue every 5 (five) minutes as needed for chest pain. 01/01/22   Antonieta Iba, MD  pantoprazole (PROTONIX) 20 MG tablet Take 1 tablet (20 mg total) by mouth 2 (two) times daily before a meal. 11/26/22 11/21/23  Hammock, Lavonna Rua, NP  Polyethyl Glycol-Propyl Glycol (SYSTANE) 0.4-0.3 % GEL ophthalmic gel Place 1 application into both eyes.    [provider]  potassium chloride (KLOR-CON) 10 MEQ tablet TAKE ONE TABLET BY MOUTH ONCE A DAY 03/07/23    Joaquim Nam, MD  spironolactone (ALDACTONE) 25 MG tablet TAKE ONE TABLET BY MOUTH DAILY 05/13/23   Antonieta Iba, MD  traZODone (DESYREL) 50 MG tablet TAKE ONE HALF (1/2) TO ONE TABLET BY MOUTH AT BEDTIME AS NEEDED FOR SLEEP. 04/08/23   Joaquim Nam, MD  warfarin (COUMADIN) 5 MG tablet TAKE 1 TABLET BY MOUTH DAILY EXCEPT TAKE 1/2 TABLET ON TUESDAYS AND FRIDAYS OR AS DIRECTED BY ANTICOAGULATION CLINIC 04/07/23   Joaquim Nam, MD    Family History Family History  Problem Relation Age of Onset   Heart disease Mother    Heart disease Father    Breast cancer Sister    Breast cancer Daughter    Colon cancer Neg Hx     Social History Social History   Tobacco Use   Smoking status: Former    Packs/day: 2.00    Years: 20.00    Additional pack years: 0.00  Total pack years: 40.00    Types: Cigarettes    Quit date: 12/14/1971    Years since quitting: 51.5   Smokeless tobacco: Never  Vaping Use   Vaping Use: Never used  Substance Use Topics   Alcohol use: Not Currently   Drug use: No     Allergies   Statins, Vitamin k and related, Wheat, Ezetimibe, Jardiance [empagliflozin], Amlodipine, Eucalyptus oil, Lisinopril, and Rice   Review of Systems Review of Systems  Genitourinary:  Positive for dysuria.     Physical Exam Triage Vital Signs ED Triage Vitals [06/17/23 1526]  Enc Vitals Group     BP (!) 145/68     Pulse Rate 64     Resp 16     Temp 97.8 F (36.6 C)     Temp Source Oral     SpO2 95 %     Weight      Height      Head Circumference      Peak Flow      Pain Score 0     Pain Loc      Pain Edu?      Excl. in GC?    No data found.  Updated Vital Signs BP (!) 145/68 (BP Location: Left Arm)   Pulse 64   Temp 97.8 F (36.6 C) (Oral)   Resp 16   SpO2 95%   Visual Acuity Right Eye Distance:   Left Eye Distance:   Bilateral Distance:    Right Eye Near:   Left Eye Near:    Bilateral Near:     Physical Exam Constitutional:       Appearance: Normal appearance.  Skin:    General: Skin is warm and dry.  Neurological:     General: No focal deficit present.     Mental Status: She is alert and oriented to person, place, and time.  Psychiatric:        Mood and Affect: Mood normal.        Behavior: Behavior normal.      UC Treatments / Results  Labs (all labs ordered are listed, but only abnormal results are displayed) Labs Reviewed  POCT URINALYSIS DIP (MANUAL ENTRY) - Abnormal; Notable for the following components:      Result Value   Clarity, UA cloudy (*)    Ketones, POC UA trace (5) (*)    Blood, UA small (*)    Protein Ur, POC trace (*)    Leukocytes, UA Large (3+) (*)    All other components within normal limits  URINE CULTURE    EKG   Radiology No results found.  Procedures Procedures (including critical care time)  Medications Ordered in UC Medications - No data to display  Initial Impression / Assessment and Plan / UC Course  I have reviewed the triage vital signs and the nursing notes.  Pertinent labs & imaging results that were available during my care of the patient were reviewed by me and considered in my medical decision making (see chart for details).   Julie Phillips is a 89 y.o. female presenting with dysuria. Patient is afebrile without recent antipyretics, satting well on room air. Overall is well appearing though non-toxic, well hydrated, without respiratory distress.  Reviewed relevant chart history.   UA result is strongly suggestive of urinary tract infection.  Large leukocytes are present with small blood.  Treating with antibiotic therapy using cephalexin and sending culture to lab for susceptibility.  Counseled patient on  potential for adverse effects with medications prescribed/recommended today, ER and return-to-clinic precautions discussed, patient verbalized understanding and agreement with care plan.  Final Clinical Impressions(s) / UC Diagnoses   Final  diagnoses:  Dysuria   Discharge Instructions   None    ED Prescriptions   None    PDMP not reviewed this encounter.   Charma Igo, FNP 06/17/23 1534    Charma Igo, FNP 06/17/23 1539

## 2023-06-17 NOTE — Discharge Instructions (Signed)
Follow up here or with your primary care provider if your symptoms are worsening or not improving.     

## 2023-06-17 NOTE — ED Triage Notes (Signed)
Dysuria, urinary hesitency and urgency x 2 weeks. Denies fevers. No OTC med use

## 2023-06-18 LAB — URINE CULTURE: Culture: 80000 — AB

## 2023-06-19 ENCOUNTER — Telehealth (HOSPITAL_BASED_OUTPATIENT_CLINIC_OR_DEPARTMENT_OTHER): Payer: PPO | Admitting: Pulmonary Disease

## 2023-06-19 DIAGNOSIS — G4733 Obstructive sleep apnea (adult) (pediatric): Secondary | ICD-10-CM

## 2023-06-19 LAB — URINE CULTURE

## 2023-06-19 NOTE — Telephone Encounter (Signed)
Trial of CPAP 10 cm with med F30 I mask

## 2023-06-20 NOTE — Telephone Encounter (Signed)
Lm x1 for patient.  

## 2023-06-20 NOTE — Telephone Encounter (Signed)
Will await Beth's response.  

## 2023-06-20 NOTE — Telephone Encounter (Signed)
Patient returned call, provided information per [name].  She stated she wore the CPAP during the sleep study, however she wanted to rip it off and throw it against the wall.  She said she does not want to wear it again.  Advised I would make her aware.  Julie Phillips, Patient did not tolerate CPAP well during sleep study and does not want to moe forward with CPAP.  Please advise if any further action needed.  Thank you.

## 2023-06-20 NOTE — Telephone Encounter (Signed)
If patient is agreeable to starting CPAP please place order. She was hesitant to try but if tolerated ok during sleep study recommend starting. Order- CPAP 10cm with med F30 I mask. Will need compliance follow-up

## 2023-06-20 NOTE — Telephone Encounter (Signed)
Lm for patient.  

## 2023-06-20 NOTE — Telephone Encounter (Signed)
Attempted to return call, call went straight to VM.  Left message that I was calling regarding starting CPAP therapy.  Will await return call.

## 2023-06-20 NOTE — Telephone Encounter (Signed)
Looking through the communication this was to qualify for nocturnal oxygen, her insurance required she have a CPAP titration study despite [place] letting them know she was claustrophobic and unable to tolerate CPAP. Please order oxygen 2L to use a night re: nocturnal hypoxia.

## 2023-06-20 NOTE — Telephone Encounter (Signed)
Pt returned call for HST results

## 2023-06-21 ENCOUNTER — Telehealth: Payer: Self-pay | Admitting: Primary Care

## 2023-06-21 NOTE — Telephone Encounter (Signed)
This could be the front since there is a recall in place

## 2023-06-21 NOTE — Telephone Encounter (Signed)
Lm x2 patient. Letter mailed to address on file.

## 2023-06-21 NOTE — Telephone Encounter (Signed)
PT calling saying she keeps getting calls and she NL wishes to pursue treatment., Please remove from any call lists and let Ms. Clent Ridges know.  TY.

## 2023-06-22 DIAGNOSIS — E261 Secondary hyperaldosteronism: Secondary | ICD-10-CM | POA: Diagnosis not present

## 2023-06-22 DIAGNOSIS — Z6841 Body Mass Index (BMI) 40.0 and over, adult: Secondary | ICD-10-CM | POA: Diagnosis not present

## 2023-06-22 DIAGNOSIS — I110 Hypertensive heart disease with heart failure: Secondary | ICD-10-CM | POA: Diagnosis not present

## 2023-06-22 DIAGNOSIS — G4700 Insomnia, unspecified: Secondary | ICD-10-CM | POA: Diagnosis not present

## 2023-06-22 DIAGNOSIS — D6869 Other thrombophilia: Secondary | ICD-10-CM | POA: Diagnosis not present

## 2023-06-22 DIAGNOSIS — I509 Heart failure, unspecified: Secondary | ICD-10-CM | POA: Diagnosis not present

## 2023-06-22 DIAGNOSIS — I25119 Atherosclerotic heart disease of native coronary artery with unspecified angina pectoris: Secondary | ICD-10-CM | POA: Diagnosis not present

## 2023-06-22 DIAGNOSIS — I4891 Unspecified atrial fibrillation: Secondary | ICD-10-CM | POA: Diagnosis not present

## 2023-06-22 DIAGNOSIS — E039 Hypothyroidism, unspecified: Secondary | ICD-10-CM | POA: Diagnosis not present

## 2023-06-22 DIAGNOSIS — E876 Hypokalemia: Secondary | ICD-10-CM | POA: Diagnosis not present

## 2023-06-22 DIAGNOSIS — D509 Iron deficiency anemia, unspecified: Secondary | ICD-10-CM | POA: Diagnosis not present

## 2023-06-23 ENCOUNTER — Ambulatory Visit (INDEPENDENT_AMBULATORY_CARE_PROVIDER_SITE_OTHER): Payer: PPO

## 2023-06-23 DIAGNOSIS — Z7901 Long term (current) use of anticoagulants: Secondary | ICD-10-CM | POA: Diagnosis not present

## 2023-06-23 LAB — POCT INR: INR: 2.9 (ref 2.0–3.0)

## 2023-06-23 MED FILL — Iron Sucrose Inj 20 MG/ML (Fe Equiv): INTRAVENOUS | Qty: 10 | Status: AC

## 2023-06-23 NOTE — Progress Notes (Signed)
In ER on 7/5 for dysuria, prescribed Keflex. Continue 1 tablet daily except take 1/2 tablet on Tuesdays and Thursdays. Recheck in 4 weeks.

## 2023-06-23 NOTE — Patient Instructions (Addendum)
Pre visit review using our clinic review tool, if applicable. No additional management support is needed unless otherwise documented below in the visit note.  Continue 1 tablet daily except take 1/2 tablet on Tuesdays and Thursdays. Recheck in 4 weeks.

## 2023-06-24 ENCOUNTER — Inpatient Hospital Stay: Payer: PPO | Admitting: Internal Medicine

## 2023-06-24 ENCOUNTER — Inpatient Hospital Stay: Payer: PPO | Attending: Internal Medicine

## 2023-06-24 ENCOUNTER — Inpatient Hospital Stay: Payer: PPO

## 2023-06-24 VITALS — BP 122/49 | HR 57 | Temp 97.6°F | Wt 220.9 lb

## 2023-06-24 DIAGNOSIS — Z803 Family history of malignant neoplasm of breast: Secondary | ICD-10-CM | POA: Diagnosis not present

## 2023-06-24 DIAGNOSIS — D508 Other iron deficiency anemias: Secondary | ICD-10-CM | POA: Diagnosis not present

## 2023-06-24 DIAGNOSIS — I4891 Unspecified atrial fibrillation: Secondary | ICD-10-CM | POA: Diagnosis not present

## 2023-06-24 DIAGNOSIS — Z7901 Long term (current) use of anticoagulants: Secondary | ICD-10-CM | POA: Diagnosis not present

## 2023-06-24 DIAGNOSIS — D509 Iron deficiency anemia, unspecified: Secondary | ICD-10-CM | POA: Insufficient documentation

## 2023-06-24 DIAGNOSIS — I130 Hypertensive heart and chronic kidney disease with heart failure and stage 1 through stage 4 chronic kidney disease, or unspecified chronic kidney disease: Secondary | ICD-10-CM | POA: Diagnosis not present

## 2023-06-24 DIAGNOSIS — N183 Chronic kidney disease, stage 3 unspecified: Secondary | ICD-10-CM | POA: Diagnosis not present

## 2023-06-24 DIAGNOSIS — I2510 Atherosclerotic heart disease of native coronary artery without angina pectoris: Secondary | ICD-10-CM | POA: Diagnosis not present

## 2023-06-24 DIAGNOSIS — Z79899 Other long term (current) drug therapy: Secondary | ICD-10-CM | POA: Insufficient documentation

## 2023-06-24 DIAGNOSIS — Z87891 Personal history of nicotine dependence: Secondary | ICD-10-CM | POA: Diagnosis not present

## 2023-06-24 LAB — CBC WITH DIFFERENTIAL/PLATELET
Abs Immature Granulocytes: 0.04 10*3/uL (ref 0.00–0.07)
Basophils Absolute: 0.1 10*3/uL (ref 0.0–0.1)
Basophils Relative: 1 %
Eosinophils Absolute: 0.3 10*3/uL (ref 0.0–0.5)
Eosinophils Relative: 3 %
HCT: 35.5 % — ABNORMAL LOW (ref 36.0–46.0)
Hemoglobin: 11.7 g/dL — ABNORMAL LOW (ref 12.0–15.0)
Immature Granulocytes: 0 %
Lymphocytes Relative: 16 %
Lymphs Abs: 1.7 10*3/uL (ref 0.7–4.0)
MCH: 29.9 pg (ref 26.0–34.0)
MCHC: 33 g/dL (ref 30.0–36.0)
MCV: 90.8 fL (ref 80.0–100.0)
Monocytes Absolute: 0.8 10*3/uL (ref 0.1–1.0)
Monocytes Relative: 8 %
Neutro Abs: 7.7 10*3/uL (ref 1.7–7.7)
Neutrophils Relative %: 72 %
Platelets: 253 10*3/uL (ref 150–400)
RBC: 3.91 MIL/uL (ref 3.87–5.11)
RDW: 15.7 % — ABNORMAL HIGH (ref 11.5–15.5)
WBC: 10.6 10*3/uL — ABNORMAL HIGH (ref 4.0–10.5)
nRBC: 0 % (ref 0.0–0.2)

## 2023-06-24 LAB — IRON AND TIBC
Iron: 101 ug/dL (ref 28–170)
Saturation Ratios: 31 % (ref 10.4–31.8)
TIBC: 328 ug/dL (ref 250–450)
UIBC: 227 ug/dL

## 2023-06-24 LAB — FERRITIN: Ferritin: 142 ng/mL (ref 11–307)

## 2023-06-24 NOTE — Progress Notes (Signed)
Patient is doing well except she does have times when she stumbles every now and then.

## 2023-06-24 NOTE — Progress Notes (Signed)
[facility]  [phone] [phone]  ID: SHERLEAN SALASAR OB: 12-24-1931  MR#: [id number]  NUU#:[id number]  Patient Care Team: Joaquim Nam, MD as PCP - General (Family Medicine) Mariah Milling Tollie Pizza, MD as PCP - Cardiology (Cardiology) Antonieta Iba, MD as Consulting Physician (Cardiology) Aida Puffer, MD as Consulting Physician (Ophthalmology) Sallee Lange, MD as Consulting Physician (Ophthalmology) Kathyrn Sheriff, [facility] (Inactive) as Pharmacist (Pharmacist) Michaelyn Barter, MD as Consulting Physician (Oncology)    HPI: Julie Phillips is a 89 y.o. female with past medical CHF, hyperlipidemia, hypertension, hypothyroidism, A-fib on warfarin was referred to hematology for workup of anemia.  Patient reports feeling fatigued for past 3 weeks.  She also experiences shortness of breath at rest or when she is talking too much.  Interestingly, however patient is able to walk around the home without issue.  She also reports dizziness and is concerned that she may pass out anytime.  Patient presented to ED on 12/03/2022 after she had a fall and hit her head.  CT head was unremarkable.  She was admitted beginning of December 2023 for same symptoms of shortness of breath, fatigue, melena and dizziness.  Her hemoglobin was 7.6 and she received 1 unit of PRBC transfusion.  There was also concern for GI bleed.  Had EGD and colonoscopy which did not show any bleeding.  Her Plavix was held.  She continues to be on Coumadin.   Interval history Patient was seen today as follow-up for iron deficiency anemia. She continues to feel well.  Energy level has improved since the iron infusion and continues to be good.  Denies any bleeding in urine or stools.  REVIEW OF SYSTEMS:   ROS  As per HPI. Otherwise, a complete review of systems is negative.  PAST MEDICAL HISTORY: Past Medical History:  Diagnosis Date   Arrhythmia    atrial fibrillation   CHF  (congestive heart failure) (HCC)    Chronic heart failure with preserved ejection fraction (HFpEF) (HCC)    a. 01/2022 Echo: EF 65-70%, no rwma, mild LVH, GrII DD, nl RV fxn, mildly dil LA, mild MR, ao scelrosis.   Fall    GERD (gastroesophageal reflux disease)    History of chicken pox    Hx of colonic polyps    Hyperlipidemia    Hypertension    Hypothyroidism    Keratopathy 2018   R eye   LVH (left ventricular hypertrophy)    Persistent atrial fibrillation (HCC)    a. Initially dx in 2012; b. 01/2022 recurrent AF rvr noted-->warfarin added (CHA2DS2VASc = 5); c. 03/2022 Amio added-->DCCV x 1 (150J).    PAST SURGICAL HISTORY: Past Surgical History:  Procedure Laterality Date   CARDIOVASCULAR STRESS TEST  2011   normal    CARDIOVERSION N/A 04/02/2022   Procedure: CARDIOVERSION;  Surgeon: Antonieta Iba, MD;  Location: [facility];  Service: Cardiovascular;  Laterality: N/A;   COLONOSCOPY WITH PROPOFOL N/A 11/16/2022   Procedure: COLONOSCOPY WITH PROPOFOL;  Surgeon: Regis Bill, MD;  Location: [facility];  Service: Endoscopy;  Laterality: N/A;   CORONARY STENT INTERVENTION N/A 05/06/2022   Procedure: CORONARY STENT INTERVENTION;  Surgeon: Iran Ouch, MD;  Location: [facility];  Service: Cardiovascular;  Laterality: N/A;   ESOPHAGOGASTRODUODENOSCOPY (EGD) WITH PROPOFOL N/A 11/14/2022   Procedure: ESOPHAGOGASTRODUODENOSCOPY (EGD) WITH PROPOFOL;  Surgeon: Regis Bill, MD;  Location: [facility];  Service: Endoscopy;  Laterality: N/A;   keratopathy     LEFT  HEART CATH AND CORONARY ANGIOGRAPHY N/A 05/06/2022   Procedure: LEFT HEART CATH AND CORONARY ANGIOGRAPHY;  Surgeon: Iran Ouch, MD;  Location: [facility];  Service: Cardiovascular;  Laterality: N/A;    FAMILY HISTORY: Family History  Problem Relation Age of Onset   Heart disease Mother    Heart disease Father    Breast cancer Sister    Breast cancer Daughter    Colon cancer Neg  Hx     HEALTH MAINTENANCE: Social History   Tobacco Use   Smoking status: Former    Current packs/day: 0.00    Average packs/day: 2.0 packs/day for 20.0 years (40.0 ttl pk-yrs)    Types: Cigarettes    Start date: 12/14/1951    Quit date: 12/14/1971    Years since quitting: 51.5   Smokeless tobacco: Never  Vaping Use   Vaping status: Never Used  Substance Use Topics   Alcohol use: Not Currently   Drug use: No     Allergies  Allergen Reactions   Statins     Myalgias   Vitamin K And Related Shortness Of Breath   Wheat     Throat swelling   Ezetimibe Other (See Comments)    Extreme fatigue   Jardiance [Empagliflozin] Other (See Comments)    Dry mouth, "gasping for breath"   Amlodipine     hematuria   Eucalyptus Oil    Lisinopril     Unrecalled BP med- possibly lisinopril- intolerant, had cough on the medicine- changed to cozaar   Rice Nausea And Vomiting    Current Outpatient Medications  Medication Sig Dispense Refill   amiodarone (PACERONE) 100 MG tablet Take 1 tablet (100 mg total) by mouth daily. 90 tablet 3   clopidogrel (PLAVIX) 75 MG tablet Take 75 mg by mouth daily.     diltiazem (CARDIZEM CD) 120 MG 24 hr capsule TAKE ONE CAPSULE BY MOUTH ONCE DAILY 90 capsule 0   Evolocumab (REPATHA SURECLICK) 140 MG/ML SOAJ Inject 140 mg into the skin every 14 (fourteen) days. 2 mL 3   furosemide (LASIX) 20 MG tablet Take 1 tablet (20 mg total) by mouth 2 (two) times daily. 180 tablet 3   gabapentin (NEURONTIN) 100 MG capsule TAKE 1 CAPSULE BY MOUTH 3 TIMES DAILY ASNEEDED 270 capsule 3   levothyroxine (SYNTHROID) 100 MCG tablet TAKE ONE TAB BY MOUTH ONCE DAILY. TAKE ON AN EMPTY STOMACH WITH A GLASS OF WATER ATLEAST 30-60 MINUTES BEFORE BREAKFAST 90 tablet 1   LOSARTAN POTASSIUM PO Apply 0.8 % to eye daily. Compound solution     metoprolol succinate (TOPROL-XL) 50 MG 24 hr tablet TAKE ONE TABLET BY MOUTH TWICE A DAY. TAKE WITH FOOD 180 tablet 0   nitroGLYCERIN (NITROSTAT) 0.4 MG  SL tablet Place 1 tablet (0.4 mg total) under the tongue every 5 (five) minutes as needed for chest pain. 25 tablet 3   pantoprazole (PROTONIX) 20 MG tablet Take 1 tablet (20 mg total) by mouth 2 (two) times daily before a meal. 180 tablet 3   Polyethyl Glycol-Propyl Glycol (SYSTANE) 0.4-0.3 % GEL ophthalmic gel Place 1 application into both eyes.     potassium chloride (KLOR-CON) 10 MEQ tablet TAKE ONE TABLET BY MOUTH ONCE A DAY 90 tablet 1   spironolactone (ALDACTONE) 25 MG tablet TAKE ONE TABLET BY MOUTH DAILY 30 tablet 5   traZODone (DESYREL) 50 MG tablet TAKE ONE HALF (1/2) TO ONE TABLET BY MOUTH AT BEDTIME AS NEEDED FOR SLEEP. 30 tablet 1  warfarin (COUMADIN) 5 MG tablet TAKE 1 TABLET BY MOUTH DAILY EXCEPT TAKE 1/2 TABLET ON TUESDAYS AND FRIDAYS OR AS DIRECTED BY ANTICOAGULATION CLINIC 110 tablet 1   No current facility-administered medications for this visit.    OBJECTIVE: Vitals:   06/24/23 1348  BP: (!) 122/49  Pulse: (!) 57  Temp: 97.6 F (36.4 C)  SpO2: 99%     Body mass index is 41.74 kg/m.      General: Well-developed, well-nourished, no acute distress. Eyes: Pink conjunctiva, anicteric sclera. HEENT: Normocephalic, moist mucous membranes, clear oropharnyx. Lungs: Clear to auscultation bilaterally. Heart: Regular rate and rhythm. No rubs, murmurs, or gallops. Abdomen: Soft, nontender, nondistended. No organomegaly noted, normoactive bowel sounds. Musculoskeletal: No edema, cyanosis, or clubbing. Neuro: Alert, answering all questions appropriately. Cranial nerves grossly intact. Skin: No rashes or petechiae noted. Psych: Normal affect. Lymphatics: No cervical, calvicular, axillary or inguinal LAD.   LAB RESULTS:  Lab Results  Component Value Date   NA 137 12/16/2022   K 3.8 12/16/2022   CL 102 12/16/2022   CO2 28 12/16/2022   GLUCOSE 94 12/16/2022   BUN 25 (H) 12/16/2022   CREATININE 1.31 (H) 12/16/2022   CALCIUM 9.4 12/16/2022   PROT 6.4 (L) 11/13/2022    ALBUMIN 3.2 (L) 11/13/2022   AST 18 11/13/2022   ALT 13 11/13/2022   ALKPHOS 58 11/13/2022   BILITOT 0.8 11/13/2022   GFRNONAA 60 (L) 11/16/2022   GFRAA 90 03/25/2020    Lab Results  Component Value Date   WBC 10.6 (H) 06/24/2023   NEUTROABS 7.7 06/24/2023   HGB 11.7 (L) 06/24/2023   HCT 35.5 (L) 06/24/2023   MCV 90.8 06/24/2023   PLT 253 06/24/2023    Lab Results  Component Value Date   TIBC 328 06/24/2023   TIBC 442 02/11/2023   TIBC 371 11/13/2022   FERRITIN 142 06/24/2023   FERRITIN 33 02/11/2023   FERRITIN 28.6 12/16/2022   IRONPCTSAT 31 06/24/2023   IRONPCTSAT 7 (L) 02/11/2023   IRONPCTSAT 11 11/13/2022     STUDIES: No results found.  ASSESSMENT AND PLAN:   Julie Phillips is a 89 y.o. female with pmh of CHF, CAD s/p stent, hyperlipidemia, hypertension, hypothyroidism, A-fib on warfarin was referred to hematology for workup of anemia.  # CKD stage III # Iron deficiency anemia -Anemia likely secondary to iron deficiency and CKD stage III.  Repeat iron panel showed iron deficiency.  Status post IV Venofer completed April 5.  She has noticed significant improvement in her symptoms such as fatigue shortness of breath.  She was also treated with Aranesp injection 100 mcg x 2.  Repeat hemoglobin has improved to 11.7.  I will hold off on further Aranesp injections.  Advised to start oral iron 325 mg 3 times a week.  -She had EGD and colonoscopy in December 2023 which not show any bleeding source.  -Hemoglobin continues to be stable at 11.7.  Iron levels are pending.  I will reach out to the patient if iron levels are low and if she needs more iron infusions.  # A-fib s/p cardioversion # CHF # CAD status post stent -Follows with Dr. Mariah Milling.  On warfarin.  Having issues maintaining her INR.  Could not afford NOACs.  Was recently seen by cardiology and no changes made.   Orders Placed This Encounter  Procedures   CBC with Differential/Platelet   Iron and TIBC    Ferritin   RTC in 6 months for MD visit, labs  Patient expressed understanding and was in agreement with this plan. She also understands that She can call clinic at any time with any questions, concerns, or complaints.   I spent a total of 30 minutes reviewing chart data, face-to-face evaluation with the patient, counseling and coordination of care as detailed above.  Michaelyn Barter, MD   06/24/2023 2:31 PM

## 2023-06-28 ENCOUNTER — Ambulatory Visit (INDEPENDENT_AMBULATORY_CARE_PROVIDER_SITE_OTHER): Payer: PPO

## 2023-06-28 VITALS — Ht 61.0 in | Wt 220.0 lb

## 2023-06-28 DIAGNOSIS — Z0000 Encounter for general adult medical examination without abnormal findings: Secondary | ICD-10-CM

## 2023-06-28 NOTE — Patient Instructions (Signed)
[name] , Thank you for taking time to come for your Medicare Wellness Visit. I appreciate your ongoing commitment to your health goals. Please review the following plan we discussed and let me know if I can assist you in the future.   These are the goals we discussed:  Goals       Increase physical activity (pt-stated)      Starting 06/22/2017, I will increase my walking with the goal of being able to walk to the [facility] and back and walk 1 lap around the track instead of driving.      Manage My Medicine      Timeframe:  Long-Range Goal Priority:  Medium Start Date:     01/15/22                        Expected End Date:   01/15/23                    Follow Up Date Aug 2023   - call for medicine refill 2 or 3 days before it runs out - call if I am sick and can't take my medicine - keep a list of all the medicines I take; vitamins and herbals too - use a pillbox to sort medicine    Why is this important?   These steps will help you keep on track with your medicines.   Notes:       Patient Stated      Starting 06/30/2018, I will continue to take medications as prescribed.       Patient Stated      07/09/2019, will take medications as prescribed. Has alarm to let know when time to take.      Patient Stated      06/24/2022, wants to lose weight      Patient Stated (pt-stated)      Patient refused to set a goal.         This is a list of the screening recommended for you and due dates:  Health Maintenance  Topic Date Due   Zoster (Shingles) Vaccine (1 of 2) Never done   DTaP/Tdap/Td vaccine (2 - Tdap) 12/13/2020   COVID-19 Vaccine (5 - 2023-24 season) 08/13/2022   Flu Shot  07/14/2023   Medicare Annual Wellness Visit  06/27/2024   Pneumonia Vaccine  Completed   DEXA scan (bone density measurement)  Completed   HPV Vaccine  Aged Out    Advanced directives: Information on Advanced Care Planning can be found at [facility]  Directives Advance Health Care Directives ([email or web address]   Directives are generally updated every 10 years or sooner if needed  Conditions/risks identified:  Aim for 30 minutes of exercise or brisk walking, 6-8 glasses of water, and 5 servings of fruits and vegetables each day.   Next appointment: VIRTUAL/TELEPHONE APPOINTMENT Follow up in one year for your annual wellness visit  July 02, 2024 at 1pm telephone call   Preventive Care 55 Years and Older, Female Preventive care refers to lifestyle choices and visits with your health care provider that can promote health and wellness. What does preventive care include? A yearly physical exam. This is also called an annual well check. Dental exams once or twice a year. Routine eye exams. Ask your health care provider how often you should have your eyes checked. Personal lifestyle choices, including: Daily care of your teeth and gums. Regular physical  activity. Eating a healthy diet. Avoiding tobacco and drug use. Limiting alcohol use. Practicing safe sex. Taking low-dose aspirin every day. Taking vitamin and mineral supplements as recommended by your health care provider. What happens during an annual well check? The services and screenings done by your health care provider during your annual well check will depend on your age, overall health, lifestyle risk factors, and family history of disease. Counseling  Your health care provider may ask you questions about your: Alcohol use. Tobacco use. Drug use. Emotional well-being. Home and relationship well-being. Sexual activity. Eating habits. History of falls. Memory and ability to understand (cognition). Work and work Astronomer. Reproductive health. Screening  You may have the following tests or measurements: Height, weight, and BMI. Blood pressure. Lipid and cholesterol levels. These may be checked every 5 years, or more frequently if you are over 11 years old. Skin  check. Lung cancer screening. You may have this screening every year starting at age 21 if you have a 30-pack-year history of smoking and currently smoke or have quit within the past 15 years. Fecal occult blood test (FOBT) of the stool. You may have this test every year starting at age 10. Flexible sigmoidoscopy or colonoscopy. You may have a sigmoidoscopy every 5 years or a colonoscopy every 10 years starting at age 73. Hepatitis C blood test. Hepatitis B blood test. Sexually transmitted disease (STD) testing. Diabetes screening. This is done by checking your blood sugar (glucose) after you have not eaten for a while (fasting). You may have this done every 1-3 years. Bone density scan. This is done to screen for osteoporosis. You may have this done starting at age 30. Mammogram. This may be done every 1-2 years. Talk to your health care provider about how often you should have regular mammograms. Talk with your health care provider about your test results, treatment options, and if necessary, the need for more tests. Vaccines  Your health care provider may recommend certain vaccines, such as: Influenza vaccine. This is recommended every year. Tetanus, diphtheria, and acellular pertussis (Tdap, Td) vaccine. You may need a Td booster every 10 years. Zoster vaccine. You may need this after age 54. Pneumococcal 13-valent conjugate (PCV13) vaccine. One dose is recommended after age 38. Pneumococcal polysaccharide (PPSV23) vaccine. One dose is recommended after age 64. Talk to your health care provider about which screenings and vaccines you need and how often you need them. This information is not intended to replace advice given to you by your health care provider. Make sure you discuss any questions you have with your health care provider. Document Released: 12/26/2015 Document Revised: 08/18/2016 Document Reviewed: 09/30/2015 Elsevier Interactive Patient Education  2017 [employer].  Fall  Prevention in the Home Falls can cause injuries. They can happen to people of all ages. There are many things you can do to make your home safe and to help prevent falls. What can I do on the outside of my home? Regularly fix the edges of walkways and driveways and fix any cracks. Remove anything that might make you trip as you walk through a door, such as a raised step or threshold. Trim any bushes or trees on the path to your home. Use bright outdoor lighting. Clear any walking paths of anything that might make someone trip, such as rocks or tools. Regularly check to see if handrails are loose or broken. Make sure that both sides of any steps have handrails. Any raised decks and porches should have guardrails on  the edges. Have any leaves, snow, or ice cleared regularly. Use sand or salt on walking paths during winter. Clean up any spills in your garage right away. This includes oil or grease spills. What can I do in the bathroom? Use night lights. Install grab bars by the toilet and in the tub and shower. Do not use towel bars as grab bars. Use non-skid mats or decals in the tub or shower. If you need to sit down in the shower, use a plastic, non-slip stool. Keep the floor dry. Clean up any water that spills on the floor as soon as it happens. Remove soap buildup in the tub or shower regularly. Attach bath mats securely with double-sided non-slip rug tape. Do not have throw rugs and other things on the floor that can make you trip. What can I do in the bedroom? Use night lights. Make sure that you have a light by your bed that is easy to reach. Do not use any sheets or blankets that are too big for your bed. They should not hang down onto the floor. Have a firm chair that has side arms. You can use this for support while you get dressed. Do not have throw rugs and other things on the floor that can make you trip. What can I do in the kitchen? Clean up any spills right away. Avoid  walking on wet floors. Keep items that you use a lot in easy-to-reach places. If you need to reach something above you, use a strong step stool that has a grab bar. Keep electrical cords out of the way. Do not use floor polish or wax that makes floors slippery. If you must use wax, use non-skid floor wax. Do not have throw rugs and other things on the floor that can make you trip. What can I do with my stairs? Do not leave any items on the stairs. Make sure that there are handrails on both sides of the stairs and use them. Fix handrails that are broken or loose. Make sure that handrails are as long as the stairways. Check any carpeting to make sure that it is firmly attached to the stairs. Fix any carpet that is loose or worn. Avoid having throw rugs at the top or bottom of the stairs. If you do have throw rugs, attach them to the floor with carpet tape. Make sure that you have a light switch at the top of the stairs and the bottom of the stairs. If you do not have them, ask someone to add them for you. What else can I do to help prevent falls? Wear shoes that: Do not have high heels. Have rubber bottoms. Are comfortable and fit you well. Are closed at the toe. Do not wear sandals. If you use a stepladder: Make sure that it is fully opened. Do not climb a closed stepladder. Make sure that both sides of the stepladder are locked into place. Ask someone to hold it for you, if possible. Clearly mark and make sure that you can see: Any grab bars or handrails. First and last steps. Where the edge of each step is. Use tools that help you move around (mobility aids) if they are needed. These include: Canes. Walkers. Scooters. Crutches. Turn on the lights when you go into a dark area. Replace any light bulbs as soon as they burn out. Set up your furniture so you have a clear path. Avoid moving your furniture around. If any of your floors are uneven,  fix them. If there are any pets around  you, be aware of where they are. Review your medicines with your doctor. Some medicines can make you feel dizzy. This can increase your chance of falling. Ask your doctor what other things that you can do to help prevent falls. This information is not intended to replace advice given to you by your health care provider. Make sure you discuss any questions you have with your health care provider. Document Released: 09/25/2009 Document Revised: 05/06/2016 Document Reviewed: 01/03/2015 Elsevier Interactive Patient Education  2017 [employer].

## 2023-06-28 NOTE — Progress Notes (Signed)
Because this visit was a virtual/telehealth visit,  certain criteria was not obtained, such a blood pressure, CBG if patient is a diabetic, and timed up and go. Any medications not marked as "taking" was not mentioned during the medication reconciliation part of the visit. Any vitals not documented were not able to be obtained due to this being a telehealth visit. Vitals documented are verbally provided by the patient.   Subjective:   Julie Phillips is a 89 y.o. female who presents for Medicare Annual (Subsequent) preventive examination.  Visit Complete: Virtual  I connected with  Julie Phillips on 06/28/23 by a audio enabled telemedicine application and verified that I am speaking with the correct person using two identifiers.  Patient Location: Home  Provider Location: Home Office  I discussed the limitations of evaluation and management by telemedicine. The patient expressed understanding and agreed to proceed.  Patient Medicare AWV questionnaire was completed by the patient on n/a; I have confirmed that all information answered by patient is correct and no changes since this date.  Review of Systems     Cardiac Risk Factors include: advanced age (>62men, >9 women);diabetes mellitus;dyslipidemia;hypertension;sedentary lifestyle;obesity (BMI >30kg/m2)     Objective:    Today's Vitals   06/28/23 1335 06/28/23 1345  Weight: 220 lb (99.8 kg)   Height: 5\' 1"  (1.549 m)   PainSc:  0-No pain   Body mass index is 41.57 kg/m.     06/28/2023    1:44 PM 06/28/2023    1:35 PM 06/24/2023    1:09 PM 03/25/2023    1:00 PM 03/14/2023    2:57 PM 03/04/2023    1:00 PM 02/11/2023    1:13 PM  Advanced Directives  Does Patient Have a Medical Advance Directive? Yes Yes Yes Yes Yes Yes Yes  Type of Estate agent of [place];Living will Healthcare Power of [place];Living will  Healthcare Power of [place];Living will Healthcare Power of [place];Living will  Living  will;Healthcare Power of Attorney  Does patient want to make changes to medical advance directive? No - Patient declined No - Patient declined    No - Guardian declined Yes (ED - Information included in AVS)  Copy of Healthcare Power of Attorney in Chart? Yes - validated most recent copy scanned in chart (See row information) Yes - validated most recent copy scanned in chart (See row information) Yes - validated most recent copy scanned in chart (See row information) Yes - validated most recent copy scanned in chart (See row information) Yes - validated most recent copy scanned in chart (See row information)    Would patient like information on creating a medical advance directive?    No - Patient declined No - Patient declined No - Patient declined Yes (ED - Information included in AVS)    Current Medications (verified) Outpatient Encounter Medications as of 06/28/2023  Medication Sig   amiodarone (PACERONE) 100 MG tablet Take 1 tablet (100 mg total) by mouth daily.   clopidogrel (PLAVIX) 75 MG tablet Take 75 mg by mouth daily.   diltiazem (CARDIZEM CD) 120 MG 24 hr capsule TAKE ONE CAPSULE BY MOUTH ONCE DAILY   Evolocumab (REPATHA SURECLICK) 140 MG/ML SOAJ Inject 140 mg into the skin every 14 (fourteen) days.   furosemide (LASIX) 20 MG tablet Take 1 tablet (20 mg total) by mouth 2 (two) times daily.   gabapentin (NEURONTIN) 100 MG capsule TAKE 1 CAPSULE BY MOUTH 3 TIMES DAILY ASNEEDED   levothyroxine (SYNTHROID) 100 MCG  tablet TAKE ONE TAB BY MOUTH ONCE DAILY. TAKE ON AN EMPTY STOMACH WITH A GLASS OF WATER ATLEAST 30-60 MINUTES BEFORE BREAKFAST   LOSARTAN POTASSIUM PO Apply 0.8 % to eye daily. Compound solution   metoprolol succinate (TOPROL-XL) 50 MG 24 hr tablet TAKE ONE TABLET BY MOUTH TWICE A DAY. TAKE WITH FOOD   nitroGLYCERIN (NITROSTAT) 0.4 MG SL tablet Place 1 tablet (0.4 mg total) under the tongue every 5 (five) minutes as needed for chest pain.   pantoprazole (PROTONIX) 20 MG tablet  Take 1 tablet (20 mg total) by mouth 2 (two) times daily before a meal.   Polyethyl Glycol-Propyl Glycol (SYSTANE) 0.4-0.3 % GEL ophthalmic gel Place 1 application into both eyes.   potassium chloride (KLOR-CON) 10 MEQ tablet TAKE ONE TABLET BY MOUTH ONCE A DAY   spironolactone (ALDACTONE) 25 MG tablet TAKE ONE TABLET BY MOUTH DAILY   traZODone (DESYREL) 50 MG tablet TAKE ONE HALF (1/2) TO ONE TABLET BY MOUTH AT BEDTIME AS NEEDED FOR SLEEP.   warfarin (COUMADIN) 5 MG tablet TAKE 1 TABLET BY MOUTH DAILY EXCEPT TAKE 1/2 TABLET ON TUESDAYS AND FRIDAYS OR AS DIRECTED BY ANTICOAGULATION CLINIC   No facility-administered encounter medications on file as of 06/28/2023.    Allergies (verified) Statins, Vitamin k and related, Wheat, Ezetimibe, Jardiance [empagliflozin], Amlodipine, Eucalyptus oil, Lisinopril, and Rice   History: Past Medical History:  Diagnosis Date   Arrhythmia    atrial fibrillation   CHF (congestive heart failure) (HCC)    Chronic heart failure with preserved ejection fraction (HFpEF) (HCC)    a. 01/2022 Echo: EF 65-70%, no rwma, mild LVH, GrII DD, nl RV fxn, mildly dil LA, mild MR, ao scelrosis.   Fall    GERD (gastroesophageal reflux disease)    History of chicken pox    Hx of colonic polyps    Hyperlipidemia    Hypertension    Hypothyroidism    Keratopathy 2018   R eye   LVH (left ventricular hypertrophy)    Persistent atrial fibrillation (HCC)    a. Initially dx in 2012; b. 01/2022 recurrent AF rvr noted-->warfarin added (CHA2DS2VASc = 5); c. 03/2022 Amio added-->DCCV x 1 (150J).   Past Surgical History:  Procedure Laterality Date   CARDIOVASCULAR STRESS TEST  2011   normal    CARDIOVERSION N/A 04/02/2022   Procedure: CARDIOVERSION;  Surgeon: Antonieta Iba, MD;  Location: [facility] ORS;  Service: Cardiovascular;  Laterality: N/A;   COLONOSCOPY WITH PROPOFOL N/A 11/16/2022   Procedure: COLONOSCOPY WITH PROPOFOL;  Surgeon: Regis Bill, MD;  Location: [facility]  ENDOSCOPY;  Service: Endoscopy;  Laterality: N/A;   CORONARY STENT INTERVENTION N/A 05/06/2022   Procedure: CORONARY STENT INTERVENTION;  Surgeon: Iran Ouch, MD;  Location: [facility] INVASIVE CV LAB;  Service: Cardiovascular;  Laterality: N/A;   ESOPHAGOGASTRODUODENOSCOPY (EGD) WITH PROPOFOL N/A 11/14/2022   Procedure: ESOPHAGOGASTRODUODENOSCOPY (EGD) WITH PROPOFOL;  Surgeon: Regis Bill, MD;  Location: [facility] ENDOSCOPY;  Service: Endoscopy;  Laterality: N/A;   keratopathy     LEFT HEART CATH AND CORONARY ANGIOGRAPHY N/A 05/06/2022   Procedure: LEFT HEART CATH AND CORONARY ANGIOGRAPHY;  Surgeon: Iran Ouch, MD;  Location: [facility] INVASIVE CV LAB;  Service: Cardiovascular;  Laterality: N/A;   Family History  Problem Relation Age of Onset   Heart disease Mother    Heart disease Father    Breast cancer Sister    Breast cancer Daughter    Colon cancer Neg Hx    Social History  Socioeconomic History   Marital status: Widowed    Spouse name: Not on file   Number of children: Not on file   Years of education: Not on file   Highest education level: Not on file  Occupational History   Occupation: Retired Conservation officer, historic buildings: RETIRED  Tobacco Use   Smoking status: Former    Current packs/day: 0.00    Average packs/day: 2.0 packs/day for 20.0 years (40.0 ttl pk-yrs)    Types: Cigarettes    Start date: 12/14/1951    Quit date: 12/14/1971    Years since quitting: 51.5   Smokeless tobacco: Never  Vaping Use   Vaping status: Never Used  Substance and Sexual Activity   Alcohol use: Not Currently   Drug use: No   Sexual activity: Not Currently  Other Topics Concern   Not on file  Social History Narrative   Divorced   Education:  [employer]   8 pregnancies, 6 live births   LMP: 1985   Lives in independent living.     Daughter died 07/09/2022   Social Determinants of Health   Financial Resource Strain: Low Risk  (06/28/2023)   Overall Financial Resource Strain  (CARDIA)    Difficulty of Paying Living Expenses: Not hard at all  Food Insecurity: No Food Insecurity (06/28/2023)   Hunger Vital Sign    Worried About Running Out of Food in the Last Year: Never true    Ran Out of Food in the Last Year: Never true  Transportation Needs: No Transportation Needs (06/28/2023)   PRAPARE - Administrator, Civil Service (Medical): No    Lack of Transportation (Non-Medical): No  Physical Activity: Inactive (06/28/2023)   Exercise Vital Sign    Days of Exercise per Week: 0 days    Minutes of Exercise per Session: 0 min  Stress: No Stress Concern Present (06/28/2023)   [employer] of Occupational Health - Occupational Stress Questionnaire    Feeling of Stress : Not at all  Social Connections: Socially Integrated (06/28/2023)   Social Connection and Isolation Panel [NHANES]    Frequency of Communication with Friends and Family: More than three times a week    Frequency of Social Gatherings with Friends and Family: More than three times a week    Attends Religious Services: More than 4 times per year    Active Member of [employer] or Organizations: Yes    Attends Engineer, structural: More than 4 times per year    Marital Status: Married    Tobacco Counseling Counseling given: Yes   Clinical Intake:  Pre-visit preparation completed: Yes  Pain : No/denies pain Pain Score: 0-No pain     BMI - recorded: 41.57 Nutritional Status: BMI > 30  Obese Nutritional Risks: None Diabetes: No  How often do you need to have someone help you when you read instructions, pamphlets, or other written materials from your doctor or pharmacy?: 1 - Never  Interpreter Needed?: No  Information entered by :: Abby Santo Zahradnik,CMA   Activities of Daily Living    06/28/2023    1:42 PM 11/13/2022    4:22 PM  In your present state of health, do you have any difficulty performing the following activities:  Hearing? 0 0  Vision? 0 0  Difficulty  concentrating or making decisions? 0 0  Walking or climbing stairs? 1 0  Comment uses walker   Dressing or bathing? 0 0  Doing errands, shopping? 0 0  Preparing Food and eating ? N   Using the Toilet? N   In the past six months, have you accidently leaked urine? N   Do you have problems with loss of bowel control? N   Managing your Medications? N   Managing your Finances? N   Housekeeping or managing your Housekeeping? N     Patient Care Team: Joaquim Nam, MD as PCP - General (Family Medicine) Mariah Milling Tollie Pizza, MD as PCP - Cardiology (Cardiology) Antonieta Iba, MD as Consulting Physician (Cardiology) Aida Puffer, MD as Consulting Physician (Ophthalmology) Sallee Lange, MD as Consulting Physician (Ophthalmology) Kathyrn Sheriff, [facility] (Inactive) as Pharmacist (Pharmacist) Michaelyn Barter, MD as Consulting Physician (Oncology)  Indicate any recent Medical Services you may have received from other than [facility] providers in the past year (date may be approximate).     Assessment:   This is a routine wellness examination for Julie Phillips.  Hearing/Vision screen Hearing Screening - Comments:: Patient denies any hearing difficulties.    Dietary issues and exercise activities discussed:     Goals Addressed               This Visit's Progress     Patient Stated (pt-stated)        Patient refused to set a goal.        Depression Screen    06/28/2023    1:42 PM 05/03/2023    8:49 AM 06/24/2022   12:06 PM 04/15/2022   10:53 AM 07/09/2019   11:23 AM 06/30/2018   11:23 AM 06/22/2017    9:12 AM  PHQ 2/9 Scores  PHQ - 2 Score 0 0 0 0 0 0 1  PHQ- 9 Score 0 0   0 0     Fall Risk    06/28/2023    1:40 PM 05/03/2023    8:49 AM 06/24/2022   12:06 PM 06/22/2022    6:42 PM 05/17/2022   11:02 AM  Fall Risk   Falls in the past year? 0 1 0 0 0  Number falls in past yr: 0 0 0 0 0  Injury with Fall? 0 0 0 0 0  Risk for fall due to : No Fall Risks Impaired balance/gait  Medication side effect    Follow up Falls prevention discussed Falls evaluation completed Falls evaluation completed;Education provided;Falls prevention discussed  Falls evaluation completed    MEDICARE RISK AT HOME:  Medicare Risk at Home - 06/28/23 1346     Any stairs in or around the home? No    If so, are there any without handrails? No    Home free of loose throw rugs in walkways, pet beds, electrical cords, etc? Yes    Adequate lighting in your home to reduce risk of falls? Yes    Life alert? No    Use of a cane, walker or w/c? Yes    Grab bars in the bathroom? Yes    Shower chair or bench in shower? Yes    Elevated toilet seat or a handicapped toilet? No             TIMED UP AND GO:  Was the test performed?  No    Cognitive Function:    07/09/2019   11:32 AM 06/30/2018   11:23 AM 06/22/2017    9:18 AM 06/17/2016   10:18 AM  MMSE - Mini Mental State Exam  Orientation to time 5 5 5 5   Orientation to Place 5 5 5 5   Registration  3 3 3 3   Attention/ Calculation 5 0 0 0  Recall 3 2 3 3   Recall-comments  unable to recall 1 of 3 words    Language- name 2 objects 0 0 0 0  Language- repeat 1 1 1 1   Language- follow 3 step command 0 3 3 3   Language- read & follow direction 0 0 0 0  Write a sentence 0 0 0 0  Copy design 0 0 0 0  Total score 22 19 20 20         06/28/2023    1:36 PM 06/24/2022   12:08 PM  6CIT Screen  What Year? 0 points 0 points  What month? 0 points 0 points  What time? 0 points 0 points  Count back from 20 0 points 0 points  Months in reverse 0 points 0 points  Repeat phrase 0 points 0 points  Total Score 0 points 0 points    Immunizations Immunization History  Administered Date(s) Administered   Fluad Quad(high Dose 65+) 09/11/2019, 11/09/2019, 10/01/2022   Influenza Split 10/04/2012   Influenza, High Dose Seasonal PF 10/19/2021   Influenza,inj,Quad PF,6+ Mos 10/08/2013, 09/25/2015, 09/14/2016, 10/06/2017   Influenza-Unspecified  09/13/2011, 08/13/2014, 10/13/2018   PFIZER(Purple Top)SARS-COV-2 Vaccination 01/03/2020, 01/21/2020, 09/11/2020, 04/18/2021   Pneumococcal Conjugate-13 08/13/2014   Pneumococcal Polysaccharide-23 12/13/2010   Td 12/13/2010    TDAP status: Due, Education has been provided regarding the importance of this vaccine. Advised may receive this vaccine at local pharmacy or Health Dept. Aware to provide a copy of the vaccination record if obtained from local pharmacy or Health Dept. Verbalized acceptance and understanding.  Flu Vaccine status: Up to date  Pneumococcal vaccine status: Up to date  Covid-19 vaccine status: Information provided on how to obtain vaccines.   Qualifies for Shingles Vaccine? Yes   Zostavax completed No   Shingrix Completed?: No.    Education has been provided regarding the importance of this vaccine. Patient has been advised to call insurance company to determine out of pocket expense if they have not yet received this vaccine. Advised may also receive vaccine at local pharmacy or Health Dept. Verbalized acceptance and understanding.  Screening Tests Health Maintenance  Topic Date Due   Zoster Vaccines- Shingrix (1 of 2) Never done   DTaP/Tdap/Td (2 - Tdap) 12/13/2020   COVID-19 Vaccine (5 - 2023-24 season) 08/13/2022   Medicare Annual Wellness (AWV)  06/25/2023   INFLUENZA VACCINE  07/14/2023   Pneumonia Vaccine 36+ Years old  Completed   DEXA SCAN  Completed   HPV VACCINES  Aged Out    Health Maintenance  Health Maintenance Due  Topic Date Due   Zoster Vaccines- Shingrix (1 of 2) Never done   DTaP/Tdap/Td (2 - Tdap) 12/13/2020   COVID-19 Vaccine (5 - 2023-24 season) 08/13/2022   Medicare Annual Wellness (AWV)  06/25/2023    Colorectal cancer screening: No longer required.   Mammogram status: No longer required due to age.  Bone Density status: Completed 02/12/2015. Results reflect: Bone density results: OSTEOPENIA. Repeat every 5 years.  Lung Cancer  Screening: (Low Dose CT Chest recommended if Age 52-80 years, 20 pack-year currently smoking OR have quit w/in 15years.) does not qualify.   Additional Screening:  Hepatitis C Screening: does not qualify;   Vision Screening: Recommended annual ophthalmology exams for early detection of glaucoma and other disorders of the eye. Is the patient up to date with their annual eye exam?  Yes  Who is the provider or what  is the name of the office in which the patient attends annual eye exams? Aida Puffer w/ [facility] If pt is not established with a provider, would they like to be referred to a provider to establish care? No .   Dental Screening: Recommended annual dental exams for proper oral hygiene  Diabetic Foot Exam: n/a  Community Resource Referral / Chronic Care Management: CRR required this visit?  No   CCM required this visit?  No     Plan:     I have personally reviewed and noted the following in the patient's chart:   Medical and social history Use of alcohol, tobacco or illicit drugs  Current medications and supplements including opioid prescriptions. Patient is not currently taking opioid prescriptions. Functional ability and status Nutritional status Physical activity Advanced directives List of other physicians Hospitalizations, surgeries, and ER visits in previous 12 months Vitals Screenings to include cognitive, depression, and falls Referrals and appointments  In addition, I have reviewed and discussed with patient certain preventive protocols, quality metrics, and best practice recommendations. A written personalized care plan for preventive services as well as general preventive health recommendations were provided to patient.     Jordan Hawks Dorothia Passmore, CMA   06/28/2023   After Visit Summary: (Mail) Due to this being a telephonic visit, the after visit summary with patients personalized plan was offered to patient via mail   Nurse Notes:

## 2023-07-21 ENCOUNTER — Ambulatory Visit: Payer: PPO

## 2023-07-28 ENCOUNTER — Ambulatory Visit (INDEPENDENT_AMBULATORY_CARE_PROVIDER_SITE_OTHER): Payer: PPO

## 2023-07-28 DIAGNOSIS — Z7901 Long term (current) use of anticoagulants: Secondary | ICD-10-CM

## 2023-07-28 LAB — POCT INR: INR: 3.5 — AB (ref 2.0–3.0)

## 2023-07-28 NOTE — Progress Notes (Signed)
Pt reports doubling a couple of doses last week and this week. She was confused she reports and could not remember if she took her dose or not.  Hold dose today and then continue 1 tablet daily except take 1/2 tablet on Tuesdays and Thursdays. Recheck in 2 weeks.

## 2023-07-28 NOTE — Patient Instructions (Addendum)
Pre visit review using our clinic review tool, if applicable. No additional management support is needed unless otherwise documented below in the visit note.  Hold dose today and then continue 1 tablet daily except take 1/2 tablet on Tuesdays and Thursdays. Recheck in 2 weeks.

## 2023-08-01 ENCOUNTER — Other Ambulatory Visit: Payer: Self-pay | Admitting: Family Medicine

## 2023-08-11 ENCOUNTER — Encounter: Payer: Self-pay | Admitting: Family Medicine

## 2023-08-11 ENCOUNTER — Ambulatory Visit (INDEPENDENT_AMBULATORY_CARE_PROVIDER_SITE_OTHER): Payer: PPO | Admitting: Family Medicine

## 2023-08-11 ENCOUNTER — Ambulatory Visit (INDEPENDENT_AMBULATORY_CARE_PROVIDER_SITE_OTHER): Payer: PPO

## 2023-08-11 VITALS — BP 132/80 | HR 58 | Temp 97.6°F | Ht 61.0 in | Wt 224.0 lb

## 2023-08-11 DIAGNOSIS — Z7901 Long term (current) use of anticoagulants: Secondary | ICD-10-CM

## 2023-08-11 DIAGNOSIS — M25519 Pain in unspecified shoulder: Secondary | ICD-10-CM | POA: Diagnosis not present

## 2023-08-11 DIAGNOSIS — R42 Dizziness and giddiness: Secondary | ICD-10-CM

## 2023-08-11 DIAGNOSIS — M67919 Unspecified disorder of synovium and tendon, unspecified shoulder: Secondary | ICD-10-CM

## 2023-08-11 LAB — POCT INR: INR: 2.2 (ref 2.0–3.0)

## 2023-08-11 MED ORDER — IRON (FERROUS SULFATE) 325 (65 FE) MG PO TABS: 325.0000 mg | ORAL_TABLET | ORAL | Status: AC

## 2023-08-11 NOTE — Progress Notes (Addendum)
Pt also has apt with PCP today. Continue 1 tablet daily except take 1/2 tablet on Tuesdays and Thursdays. Recheck in 3 weeks.

## 2023-08-11 NOTE — Assessment & Plan Note (Signed)
Concern for RTC irritation. D/w pt about seeing HHPT, referral placed.

## 2023-08-11 NOTE — Assessment & Plan Note (Signed)
See notes on labs.  Could have been from relative hypotension.  Cardiac monitor on watch didn't detect A fib.  Caution on standing. No change meds yet.    This could be separate from vertigo sx, d/w pt about home bedside exercise for vertigo.  Update me as needed.

## 2023-08-11 NOTE — Patient Instructions (Addendum)
Pre visit review using our clinic review tool, if applicable. No additional management support is needed unless otherwise documented below in the visit note.  Continue 1 tablet daily except take 1/2 tablet on Tuesdays and Thursdays. Recheck in 3 weeks.

## 2023-08-11 NOTE — Patient Instructions (Addendum)
Careful on standing quickly.   Try the bedside exercise for vertigo.  Go to the lab on the way out.   If you have mychart we'll likely use that to update you.    Take care.  Glad to see you. Let me know if you have more symptoms.  You should get a call about PT at home for your shoulder.

## 2023-08-11 NOTE — Progress Notes (Signed)
Presyncopal.  One episode that was significant. The room didn't spin at the time. No heart racing and her watch didn't detect A fib.  SBP up to 160s initially but then normalized on home check.     Some occ episodes with room spinning with sudden position change.  She had distant h/o vertigo.  This is different from the above.    Feels normal now.  Taking ferrous sulfate 325mg  MWF.   She has episodic L shoulder pain.  Pain with int and ext rotation.  No arm drop.  D/w pt about setting up HHPT. She needs rolling walker to get out of home.    Meds, vitals, and allergies reviewed.   ROS: Per HPI unless specifically indicated in ROS section   Nad Ncat R ear wax removed with curette after getting consent.  No complication.  Completely removed.  TM wnl after recheck Neck supple, no LA L shoulder pain with int and ext rotation.  No arm drop.  Pain on the lateral upper humerus.   Rrr Ctab Abd soft, not ttp No BLE edema.   25 minutes were devoted to patient care in this encounter (this includes time spent reviewing the patient's file/history, interviewing and examining the patient, counseling/reviewing plan with patient).

## 2023-08-12 LAB — CBC WITH DIFFERENTIAL/PLATELET
Basophils Absolute: 0.1 10*3/uL (ref 0.0–0.1)
Basophils Relative: 1 % (ref 0.0–3.0)
Eosinophils Absolute: 0.4 10*3/uL (ref 0.0–0.7)
Eosinophils Relative: 3.8 % (ref 0.0–5.0)
HCT: 37.6 % (ref 36.0–46.0)
Hemoglobin: 12.5 g/dL (ref 12.0–15.0)
Lymphocytes Relative: 15.8 % (ref 12.0–46.0)
Lymphs Abs: 1.5 10*3/uL (ref 0.7–4.0)
MCHC: 33.3 g/dL (ref 30.0–36.0)
MCV: 94 fl (ref 78.0–100.0)
Monocytes Absolute: 0.6 10*3/uL (ref 0.1–1.0)
Monocytes Relative: 6.6 % (ref 3.0–12.0)
Neutro Abs: 7.1 10*3/uL (ref 1.4–7.7)
Neutrophils Relative %: 72.8 % (ref 43.0–77.0)
Platelets: 265 10*3/uL (ref 150.0–400.0)
RBC: 4 Mil/uL (ref 3.87–5.11)
RDW: 14.1 % (ref 11.5–15.5)
WBC: 9.8 10*3/uL (ref 4.0–10.5)

## 2023-08-12 LAB — COMPREHENSIVE METABOLIC PANEL
ALT: 8 U/L (ref 0–35)
AST: 14 U/L (ref 0–37)
Albumin: 3.8 g/dL (ref 3.5–5.2)
Alkaline Phosphatase: 88 U/L (ref 39–117)
BUN: 19 mg/dL (ref 6–23)
CO2: 30 meq/L (ref 19–32)
Calcium: 9.1 mg/dL (ref 8.4–10.5)
Chloride: 101 meq/L (ref 96–112)
Creatinine, Ser: 1.23 mg/dL — ABNORMAL HIGH (ref 0.40–1.20)
GFR: 39.14 mL/min — ABNORMAL LOW (ref >60.00)
Glucose, Bld: 94 mg/dL (ref 70–99)
Potassium: 4 meq/L (ref 3.5–5.1)
Sodium: 138 meq/L (ref 135–145)
Total Bilirubin: 0.4 mg/dL (ref 0.2–1.2)
Total Protein: 6.6 g/dL (ref 6.0–8.3)

## 2023-08-12 LAB — TSH: TSH: 1.74 u[IU]/mL (ref 0.35–5.50)

## 2023-08-18 DIAGNOSIS — I5033 Acute on chronic diastolic (congestive) heart failure: Secondary | ICD-10-CM | POA: Diagnosis not present

## 2023-08-18 DIAGNOSIS — E669 Obesity, unspecified: Secondary | ICD-10-CM | POA: Diagnosis not present

## 2023-08-18 DIAGNOSIS — Z9181 History of falling: Secondary | ICD-10-CM | POA: Diagnosis not present

## 2023-08-18 DIAGNOSIS — G4730 Sleep apnea, unspecified: Secondary | ICD-10-CM | POA: Diagnosis not present

## 2023-08-18 DIAGNOSIS — I482 Chronic atrial fibrillation, unspecified: Secondary | ICD-10-CM | POA: Diagnosis not present

## 2023-08-18 DIAGNOSIS — K219 Gastro-esophageal reflux disease without esophagitis: Secondary | ICD-10-CM | POA: Diagnosis not present

## 2023-08-18 DIAGNOSIS — M5432 Sciatica, left side: Secondary | ICD-10-CM | POA: Diagnosis not present

## 2023-08-18 DIAGNOSIS — E785 Hyperlipidemia, unspecified: Secondary | ICD-10-CM | POA: Diagnosis not present

## 2023-08-18 DIAGNOSIS — F4321 Adjustment disorder with depressed mood: Secondary | ICD-10-CM | POA: Diagnosis not present

## 2023-08-18 DIAGNOSIS — I130 Hypertensive heart and chronic kidney disease with heart failure and stage 1 through stage 4 chronic kidney disease, or unspecified chronic kidney disease: Secondary | ICD-10-CM | POA: Diagnosis not present

## 2023-08-18 DIAGNOSIS — N183 Chronic kidney disease, stage 3 unspecified: Secondary | ICD-10-CM | POA: Diagnosis not present

## 2023-08-18 DIAGNOSIS — E559 Vitamin D deficiency, unspecified: Secondary | ICD-10-CM | POA: Diagnosis not present

## 2023-08-18 DIAGNOSIS — E039 Hypothyroidism, unspecified: Secondary | ICD-10-CM | POA: Diagnosis not present

## 2023-08-18 DIAGNOSIS — Z87891 Personal history of nicotine dependence: Secondary | ICD-10-CM | POA: Diagnosis not present

## 2023-08-18 DIAGNOSIS — D509 Iron deficiency anemia, unspecified: Secondary | ICD-10-CM | POA: Diagnosis not present

## 2023-08-18 DIAGNOSIS — M8580 Other specified disorders of bone density and structure, unspecified site: Secondary | ICD-10-CM | POA: Diagnosis not present

## 2023-08-18 DIAGNOSIS — R251 Tremor, unspecified: Secondary | ICD-10-CM | POA: Diagnosis not present

## 2023-08-18 DIAGNOSIS — M75102 Unspecified rotator cuff tear or rupture of left shoulder, not specified as traumatic: Secondary | ICD-10-CM | POA: Diagnosis not present

## 2023-08-18 DIAGNOSIS — Z6841 Body Mass Index (BMI) 40.0 and over, adult: Secondary | ICD-10-CM | POA: Diagnosis not present

## 2023-08-18 DIAGNOSIS — D631 Anemia in chronic kidney disease: Secondary | ICD-10-CM | POA: Diagnosis not present

## 2023-08-26 ENCOUNTER — Other Ambulatory Visit: Payer: Self-pay | Admitting: Cardiovascular Disease

## 2023-08-26 ENCOUNTER — Other Ambulatory Visit: Payer: Self-pay | Admitting: Family Medicine

## 2023-08-26 NOTE — Telephone Encounter (Signed)
Pt scheduled on 9/27

## 2023-08-26 NOTE — Telephone Encounter (Signed)
Please contact pt for future appointment. Pt overdue for 6 month f/u.

## 2023-08-29 DIAGNOSIS — M8580 Other specified disorders of bone density and structure, unspecified site: Secondary | ICD-10-CM | POA: Diagnosis not present

## 2023-08-29 DIAGNOSIS — N183 Chronic kidney disease, stage 3 unspecified: Secondary | ICD-10-CM | POA: Diagnosis not present

## 2023-08-29 DIAGNOSIS — D631 Anemia in chronic kidney disease: Secondary | ICD-10-CM | POA: Diagnosis not present

## 2023-08-29 DIAGNOSIS — I5033 Acute on chronic diastolic (congestive) heart failure: Secondary | ICD-10-CM | POA: Diagnosis not present

## 2023-08-29 DIAGNOSIS — M5432 Sciatica, left side: Secondary | ICD-10-CM | POA: Diagnosis not present

## 2023-08-29 DIAGNOSIS — F4321 Adjustment disorder with depressed mood: Secondary | ICD-10-CM | POA: Diagnosis not present

## 2023-08-29 DIAGNOSIS — I130 Hypertensive heart and chronic kidney disease with heart failure and stage 1 through stage 4 chronic kidney disease, or unspecified chronic kidney disease: Secondary | ICD-10-CM | POA: Diagnosis not present

## 2023-08-29 DIAGNOSIS — G4730 Sleep apnea, unspecified: Secondary | ICD-10-CM | POA: Diagnosis not present

## 2023-08-29 DIAGNOSIS — D509 Iron deficiency anemia, unspecified: Secondary | ICD-10-CM | POA: Diagnosis not present

## 2023-08-29 DIAGNOSIS — K219 Gastro-esophageal reflux disease without esophagitis: Secondary | ICD-10-CM | POA: Diagnosis not present

## 2023-08-29 DIAGNOSIS — I482 Chronic atrial fibrillation, unspecified: Secondary | ICD-10-CM | POA: Diagnosis not present

## 2023-08-29 DIAGNOSIS — M75102 Unspecified rotator cuff tear or rupture of left shoulder, not specified as traumatic: Secondary | ICD-10-CM | POA: Diagnosis not present

## 2023-08-30 ENCOUNTER — Telehealth: Payer: Self-pay | Admitting: Family Medicine

## 2023-08-30 NOTE — Telephone Encounter (Signed)
Please give the order.  Thanks.   

## 2023-08-30 NOTE — Telephone Encounter (Signed)
Verbal orders given to [facility].

## 2023-08-30 NOTE — Telephone Encounter (Signed)
Home Health verbal orders Caller Name: stephanie  Agency Name: [facility]   Callback number: [phone], secured   Requesting OT/PT/Skilled nursing/Social Work/Speech: OT   Reason:  Frequency: one x four, two x one & one x two   Please forward to [facility] pool or providers CMA

## 2023-09-01 ENCOUNTER — Ambulatory Visit (INDEPENDENT_AMBULATORY_CARE_PROVIDER_SITE_OTHER): Payer: PPO

## 2023-09-01 DIAGNOSIS — Z7901 Long term (current) use of anticoagulants: Secondary | ICD-10-CM | POA: Diagnosis not present

## 2023-09-01 LAB — POCT INR: INR: 2.5 (ref 2.0–3.0)

## 2023-09-01 NOTE — Patient Instructions (Addendum)
Pre visit review using our clinic review tool, if applicable. No additional management support is needed unless otherwise documented below in the visit note.  Continue 1 tablet daily except take 1/2 tablet on Tuesdays and Thursdays. Recheck in 4 weeks.

## 2023-09-01 NOTE — Progress Notes (Signed)
Continue 1 tablet daily except take 1/2 tablet on Tuesdays and Thursdays. Recheck in 4 weeks.

## 2023-09-07 NOTE — Progress Notes (Deleted)
 Cardiology Office Note:  .   Date:  09/07/2023  ID:  Julie Phillips, DOB [birth year], MRN [id number] PCP: Donnie Galea, MD  [facility] Providers Cardiologist:  Belva Boyden, MD { Click to update primary MD,subspecialty MD or APP then REFRESH:1}   History of Present Illness: .   Julie Phillips is a 89 y.o. female with past medical history of HFpEF, persistent atrial fibrillation on chronic anticoagulation with warfarin, hypertension, hyperlipidemia, hypothyroidism, hearing loss, obesity, who presents today for follow-up.  She was initially diagnosed with atrial fibrillation Hypokalemia due to cirrhosis.  Echo at that time showed preserved EF with mild MR and TR.  Stress test was nonischemic.  She initially was placed on warfarin The patient is continued to get frequent falls.  In February 2020 she presented to [facility] for chest pressure with mildly elevated high-sensitivity troponin in the setting of atrial fibrillation with RVR.  Repeat echocardiogram revealed an EF of 65 to 70%, RWMA, mild LVH, G2 DD, mild MR, and aortic valve sclerosis without stenosis.  In April 2023 she was at PCP office with complaints of dizziness found to be in atrial fibrillation with RVR with rates of 140s she was subsequently transferred to [facility] emergency department.  She underwent successful cardioversion/21/23 and her amiodarone  was transitioned to oral dosing and she was discharged to the hospital on 04/22/2022.  Coronary CTA revealed a coronary calcium score of 2 which was in the 87 percentile for age and sex matched control, normal coronary origins with right dominance, calcified plaque causing severe proximal mid LAD stenosis, calcified plaque with moderate proximal left circumflex stenosis with CAD-RADS 4.  She presented for elective heart catheterization on 05/06/2022 where she had successful PCI/DES to the mid LAD.  She was evaluated in clinic 11/12/2021 with complaints of shortness of breath and was sent to  the emergency department for further evaluation and found to have a hemoglobin of 7.6.  Hemoglobin probably a month prior was 11.2.  She was transfused and her warfarin was placed on hold for GI consultation and plan.  EGD was unremarkable and colonoscopy completed on 11/16/2022 which revealed findings of likely the patient having AVMs.  She was last seen in clinic 01/03/2023 by Dr Gollan.  At that time she was having some shortness of breath on exertion that improved denying any chest pain, denies any peripheral edema, denies any significant abdominal swelling.  Preferred to stay on warfarin versus NOAC.  There were no medication changes were made and no further testing that was ordered.  She returns to clinic today    ROS: 10 point review of system has been reviewed and considered negative with exception of what is been listed in the HPI  Studies Reviewed: Aaron Aas       LHC 05/06/22   2nd Mrg lesion is 99% stenosed.   Mid LAD lesion is 90% stenosed.   Mid LAD to Dist LAD lesion is 20% stenosed.   A drug-eluting stent was successfully placed using a STENT ONYX FRONTIER 3.0X26.   Post intervention, there is a 0% residual stenosis.   1.  Severe two-vessel coronary artery disease with 90% stenosis in the mid LAD and subtotal occlusion of a small OM 2.  Codominant coronary arteries.  The coronary arteries are moderately calcified overall. 2.  Mildly elevated left ventricular end-diastolic pressure.  Left ventricular angiography was not performed.  EF was normal by echo. 3.  Successful angioplasty and drug-eluting stent placement to the mid LAD.  Recommendations: Warfarin can be resumed tonight. Treat with aspirin  and clopidogrel  for now.  Once INR is therapeutic, aspirin  can be discontinued to minimize the risk of bleeding. The patient is intolerant to statins.  Consider alternative medications. OM 2 is too small to stent.    Coronary CTA 04/22/22 IMPRESSION: 1. Coronary calcium score of 902. This  was 87th percentile for age and sex matched control.   2. Normal coronary origin with right dominance.   3. Calcified plaque causing severe proximal LAD stenosis (>70%).   4. Calcified plaque causing moderate proximal LCx stenosis (50%).   5. CAD-RADS 4 Severe stenosis. (70-99% or > 50% left main). Cardiac catheterization is recommended.   6. Additional analysis with CT FFR will be submitted and reported separately.  TTE 02/10/22 1. Left ventricular ejection fraction, by estimation, is 65 to 70%. The  left ventricle has normal function. The left ventricle has no regional  wall motion abnormalities. There is mild left ventricular hypertrophy.  Left ventricular diastolic parameters  are consistent with Grade II diastolic dysfunction (pseudonormalization).   2. Right ventricular systolic function is normal. The right ventricular  size is normal.   3. Left atrial size was mildly dilated.   4. The mitral valve is degenerative. Mild mitral valve regurgitation.  Moderate mitral annular calcification.   5. The aortic valve was not well visualized. Aortic valve regurgitation  is not visualized. Aortic valve sclerosis/calcification is present,  without any evidence of aortic stenosis.   6. The inferior vena cava is normal in size with greater than 50%  respiratory variability, suggesting right atrial pressure of 3 mmHg.  Risk Assessment/Calculations:    CHA2DS2-VASc Score = 6  {Click here to calculate score.  REFRESH note before signing. :1} This indicates a 9.7% annual risk of stroke. The patient's score is based upon: CHF History: 1 HTN History: 1 Diabetes History: 0 Stroke History: 0 Vascular Disease History: 1 Age Score: 2 Gender Score: 1   {This patient has a significant risk of stroke if diagnosed with atrial fibrillation.  Please consider VKA or DOAC agent for anticoagulation if the bleeding risk is acceptable.   You can also use the SmartPhrase .[email or web address] for  documentation.   :[id number]} No BP recorded.  {Refresh Note OR Click here to enter BP  :1}***   STOP-Bang Score:     { Consider Dx Sleep Disordered Breathing or Sleep Apnea  ICD G47.33          :1}    Physical Exam:   VS:  There were no vitals taken for this visit.   Wt Readings from Last 3 Encounters:  08/11/23 224 lb (101.6 kg)  06/28/23 220 lb (99.8 kg)  06/24/23 220 lb 14.4 oz (100.2 kg)    GEN: Well nourished, well developed in no acute distress NECK: No JVD; No carotid bruits CARDIAC: ***RRR, no murmurs, rubs, gallops RESPIRATORY:  Clear to auscultation without rales, wheezing or rhonchi  ABDOMEN: Soft, non-tender, non-distended EXTREMITIES:  No edema; No deformity   ASSESSMENT AND PLAN: .   ***    {Are you ordering a CV Procedure (e.g. stress test, cath, DCCV, TEE, etc)?   Press F2        :[id number]}  Dispo: ***  Signed, Doristine Shehan, NP  This encounter was created in error - please disregard.

## 2023-09-09 ENCOUNTER — Ambulatory Visit: Payer: PPO | Admitting: Cardiology

## 2023-09-28 ENCOUNTER — Other Ambulatory Visit: Payer: Self-pay | Admitting: Family Medicine

## 2023-09-29 ENCOUNTER — Ambulatory Visit: Payer: PPO

## 2023-09-29 DIAGNOSIS — Z7901 Long term (current) use of anticoagulants: Secondary | ICD-10-CM

## 2023-09-29 LAB — POCT INR: INR: 2.4 (ref 2.0–3.0)

## 2023-09-29 NOTE — Telephone Encounter (Signed)
Sent. Thanks.   

## 2023-09-29 NOTE — Patient Instructions (Addendum)
Pre visit review using our clinic review tool, if applicable. No additional management support is needed unless otherwise documented below in the visit note.  Continue 1 tablet daily except take 1/2 tablet on Tuesdays and Thursdays. Recheck in 4 weeks.

## 2023-09-29 NOTE — Telephone Encounter (Signed)
LAST APPOINTMENT DATE: 08/11/23  NEXT APPOINTMENT DATE: none     LAST REFILL: 04/08/23  QTY: #30 1 rf

## 2023-09-29 NOTE — Progress Notes (Signed)
Continue 1 tablet daily except take 1/2 tablet on Tuesdays and Thursdays. Recheck in 4 weeks.

## 2023-10-05 DIAGNOSIS — H18891 Other specified disorders of cornea, right eye: Secondary | ICD-10-CM | POA: Diagnosis not present

## 2023-10-05 DIAGNOSIS — H18421 Band keratopathy, right eye: Secondary | ICD-10-CM | POA: Diagnosis not present

## 2023-10-10 ENCOUNTER — Telehealth: Payer: Self-pay | Admitting: Family Medicine

## 2023-10-10 NOTE — Telephone Encounter (Signed)
Noted. Thanks. Please make sure she is still going through with PT to limit fall risk.

## 2023-10-10 NOTE — Telephone Encounter (Signed)
Julie Phillips from [facility] called to report that patient had a fall on  Saturday October 26,2024.Patient Has bruising on right  upper arm and under  right eye.Patient said that she is fine.

## 2023-10-11 NOTE — Telephone Encounter (Signed)
Spoke with pt asking about PT. States last day of PT was 10/25, then she fell on 10/26. Pt sill insists she is doing fine. Fyi to Dr Para March.

## 2023-10-11 NOTE — Telephone Encounter (Signed)
Noted. Thanks.

## 2023-10-26 ENCOUNTER — Ambulatory Visit: Payer: PPO | Attending: Cardiology | Admitting: Cardiology

## 2023-10-26 ENCOUNTER — Encounter: Payer: Self-pay | Admitting: Cardiology

## 2023-10-26 VITALS — BP 117/72 | HR 53 | Ht 60.0 in | Wt 225.4 lb

## 2023-10-26 DIAGNOSIS — Z8719 Personal history of other diseases of the digestive system: Secondary | ICD-10-CM | POA: Diagnosis not present

## 2023-10-26 DIAGNOSIS — I25118 Atherosclerotic heart disease of native coronary artery with other forms of angina pectoris: Secondary | ICD-10-CM

## 2023-10-26 DIAGNOSIS — E782 Mixed hyperlipidemia: Secondary | ICD-10-CM

## 2023-10-26 DIAGNOSIS — I4819 Other persistent atrial fibrillation: Secondary | ICD-10-CM | POA: Diagnosis not present

## 2023-10-26 DIAGNOSIS — I10 Essential (primary) hypertension: Secondary | ICD-10-CM

## 2023-10-26 DIAGNOSIS — G4730 Sleep apnea, unspecified: Secondary | ICD-10-CM | POA: Diagnosis not present
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
## 2023-10-26 NOTE — Patient Instructions (Signed)
Medication Instructions:  Your physician recommends that you continue on your current medications as directed. Please refer to the Current Medication list given to you today.  *If you need a refill on your cardiac medications before your next appointment, please call your pharmacy*  Lab Work: - None ordered  Testing/Procedures: - None ordered  Follow-Up: At [facility], you and your health needs are our priority.  As part of our continuing mission to provide you with exceptional heart care, we have created designated Provider Care Teams.  These Care Teams include your primary Cardiologist (physician) and Advanced Practice Providers (APPs -  Physician Assistants and Nurse Practitioners) who all work together to provide you with the care you need, when you need it.  Your next appointment:   6 month(s)  Provider:   Julien Nordmann, MD    Other Instructions - None

## 2023-10-27 ENCOUNTER — Ambulatory Visit: Payer: PPO

## 2023-10-27 ENCOUNTER — Telehealth: Payer: Self-pay

## 2023-10-27 NOTE — Telephone Encounter (Signed)
Pt c/o diarrhea since last night and would like to RS coumadin clinic apt today. RS apt for next week. Advised to stay well hydrated and if any changes or worsening contact the office for an apt. Pt verbalized understanding.

## 2023-10-27 NOTE — Telephone Encounter (Signed)
Noted. Thanks.

## 2023-11-03 ENCOUNTER — Ambulatory Visit: Payer: PPO

## 2023-11-03 DIAGNOSIS — Z7901 Long term (current) use of anticoagulants: Secondary | ICD-10-CM

## 2023-11-03 LAB — POCT INR: INR: 2.5 (ref 2.0–3.0)

## 2023-11-03 NOTE — Progress Notes (Signed)
Pt reports intermittent diarrhea for 1 week.  Continue 1 tablet daily except take 1/2 tablet on Tuesdays and Thursdays. Recheck in 4 weeks.

## 2023-11-03 NOTE — Patient Instructions (Addendum)
Pre visit review using our clinic review tool, if applicable. No additional management support is needed unless otherwise documented below in the visit note.  Continue 1 tablet daily except take 1/2 tablet on Tuesdays and Thursdays. Recheck in 4 weeks.

## 2023-11-06 IMAGING — DX DG CHEST 1V PORT
1 series · 1 of 1 positions shown · non-contrast
Comparison: February 08, 2022

CLINICAL DATA: Shortness of breath x3 days, history of AFib and
CHF.

EXAM:
PORTABLE CHEST 1 VIEW

[chest ap]
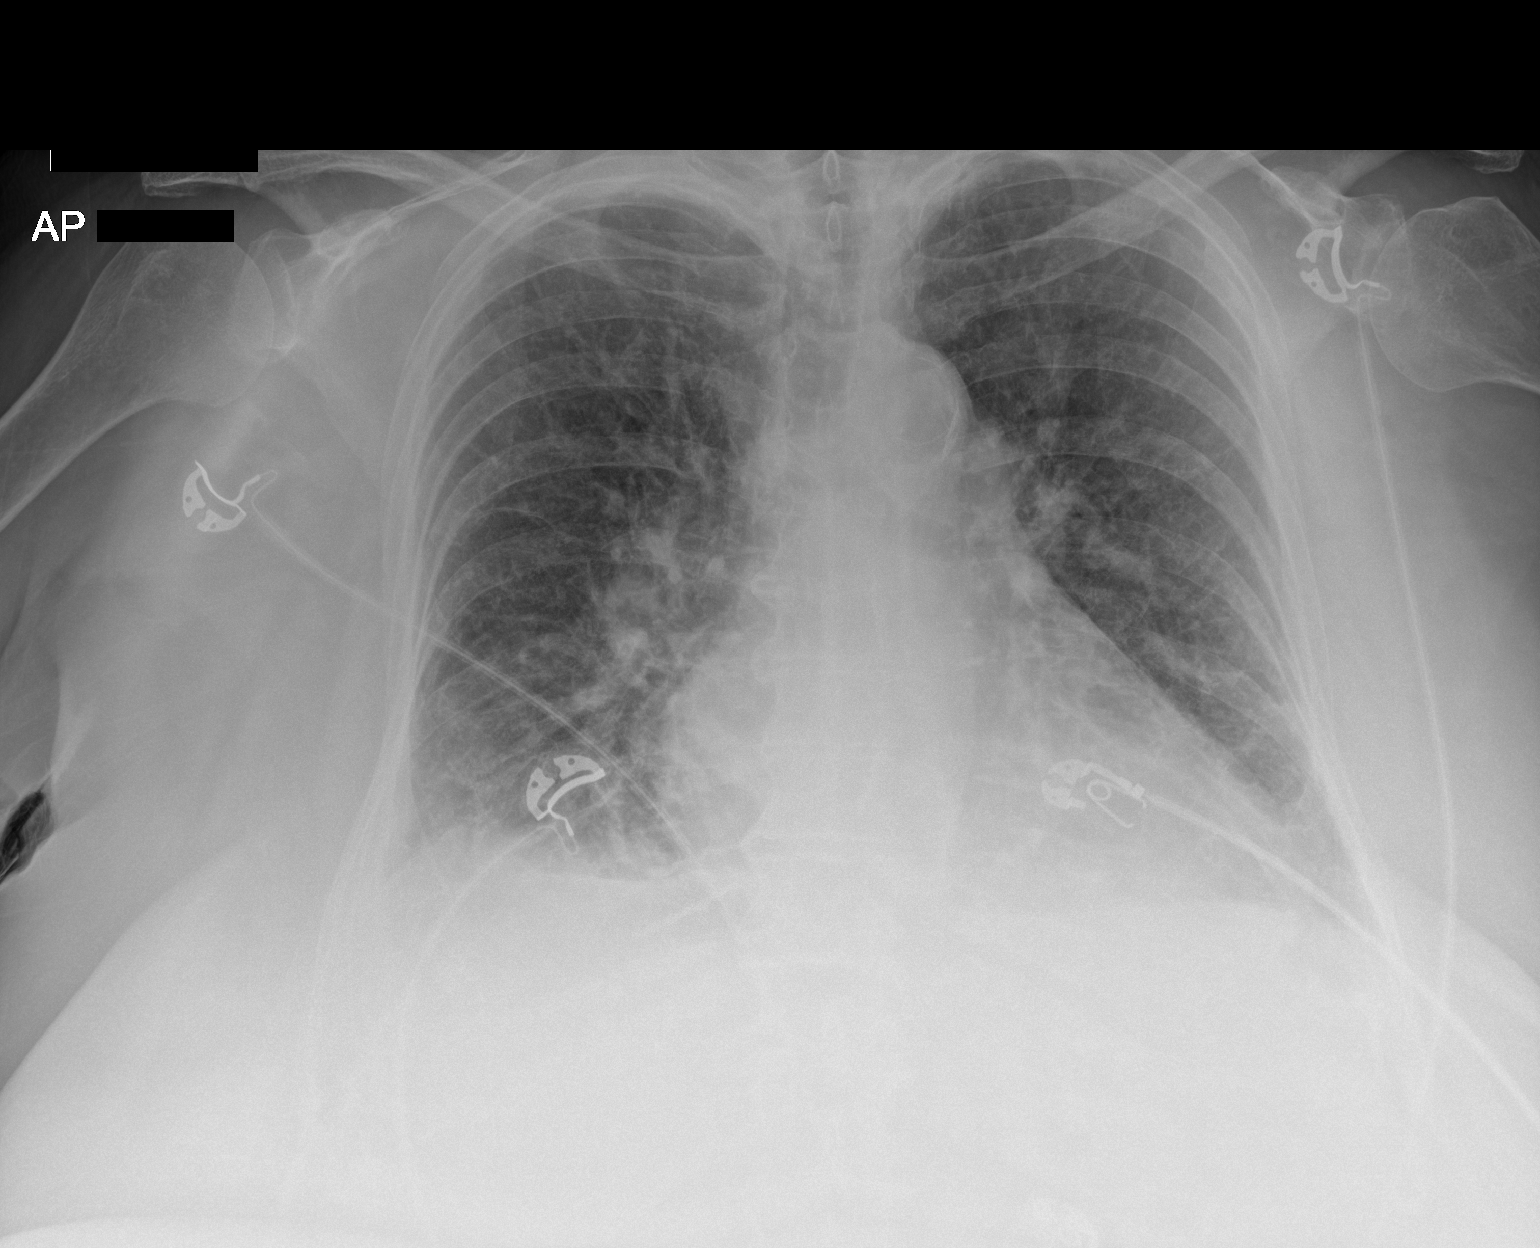

[1 of 1 positions shown; findings below may reference images not displayed]

FINDINGS: Enlarged cardiac silhouette with central vascular prominence.
Bibasilar predominant interstitial opacities. Possible small right
pleural effusion. No visible pneumothorax. No acute osseous
abnormality.
IMPRESSION: Findings suggestive of CHF with interstitial pulmonary edema.

## 2023-11-18 ENCOUNTER — Other Ambulatory Visit: Payer: Self-pay | Admitting: Cardiovascular Disease

## 2023-11-27 IMAGING — CT CT HEART MORP W/ CTA COR W/ SCORE W/ CA W/CM &/OR W/O CM
1 of 13 series · 4 of 20 positions shown, 5 images · non-contrast
Comparison: None.

Addendum:
EXAM:
Cardiac/Coronary  CTA
TECHNIQUE: The patient was scanned on a Siemens Somatom go.Top scanner.

[Series 6: multiphase % cta coronary 0.60 · axial · 0.37mm/px · z∈[-1099,-1032]mm · 4 of 3336 slices shown, 5 images]
[im 668/3336  vessel]
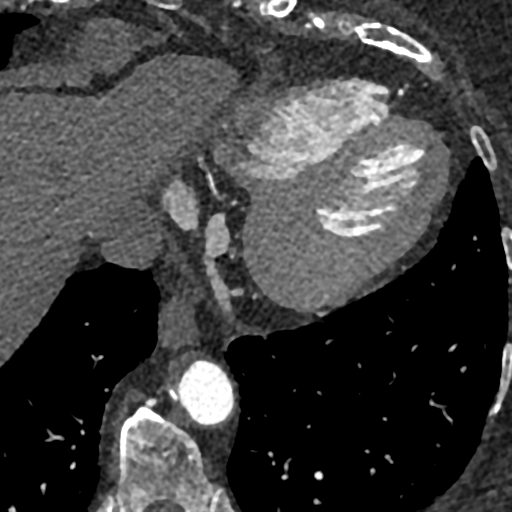
[im 668/3336  lung]
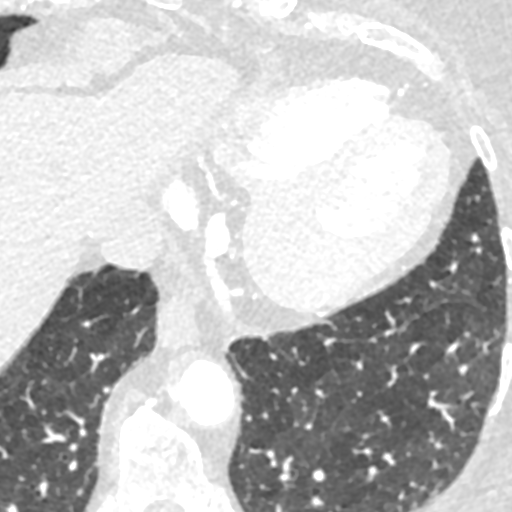
[im 1335/3336  vessel]
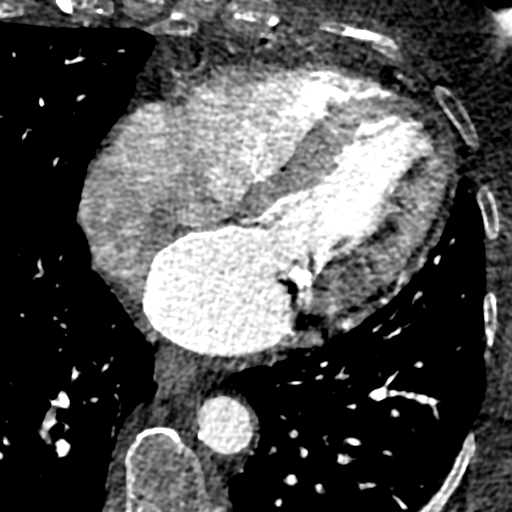
[im 2002/3336  vessel]
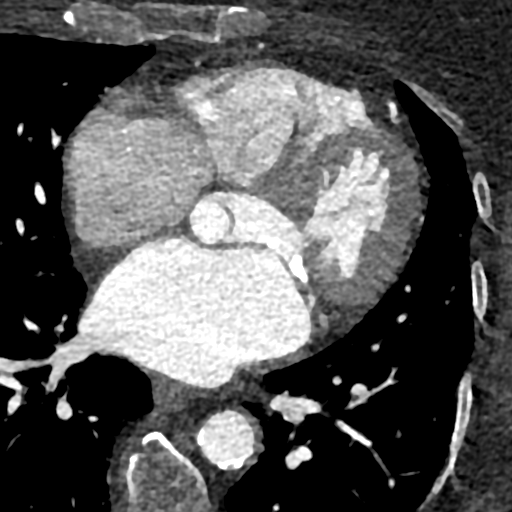
[im 2669/3336  vessel]
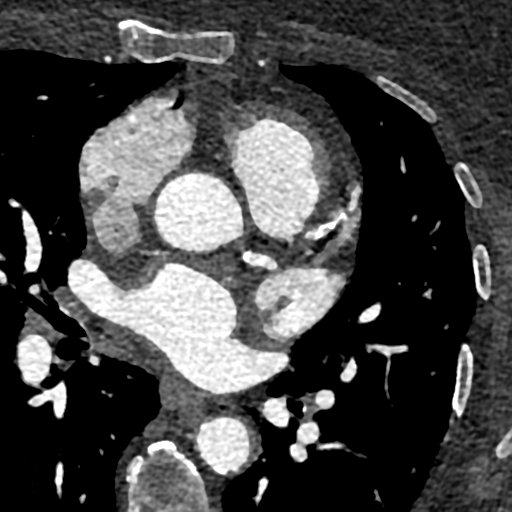

[4 of 20 positions shown; findings below may reference images not displayed]

:
A retrospective scan was triggered in the descending thoracic aorta.
Axial non-contrast 3 mm slices were carried out through the heart.
The data set was analyzed on a dedicated work station and scored
using the Agatson method. Gantry rotation speed was 330 msecs and
collimation was .6 mm. 100mg of metoprolol and 0.8 mg of sl NTG was
given. The 3D data set was reconstructed in 5% intervals of the
60-95 % of the R-R cycle. Diastolic phases were analyzed on a
dedicated work station using MPR, MIP and VRT modes. The patient
received 75 cc of contrast.
FINDINGS: Aorta: Normal size. Aortic root and descending aorta calcifications.
No dissection.

Aortic Valve:  Trileaflet.  No calcifications.

Coronary Arteries:  Normal coronary origin.  Right dominance.

RCA is a dominant artery that gives rise to PDA and PLA. There is
calcified plaque in the proximal segment causing mild stenosis
(25-49%).

Left main is a large artery that gives rise to LAD and LCX arteries.
There is no LM disease.

LAD has calcified plaque in the proximal segment causing severe
stenosis (>70%)

LCX is a non-dominant artery that gives rise to one two obtuse
marginal branches. There is calcified plaque in the proximal LCx
segment causing moderate stenosis (50%).

Other findings:

Normal pulmonary vein drainage into the left atrium.

Normal left atrial appendage without a thrombus.

Normal size of the pulmonary artery.
IMPRESSION: 1. Coronary calcium score of 902. This was 87th percentile for age
and sex matched control.

2. Normal coronary origin with right dominance.

3. Calcified plaque causing severe proximal LAD stenosis (>70%).

4. Calcified plaque causing moderate proximal LCx stenosis (50%).

5. CAD-RADS 4 Severe stenosis. (70-99% or > 50% left main). Cardiac
catheterization is recommended.

6. Additional analysis with CT FFR will be submitted and reported
separately.

EXAM:
OVER-READ INTERPRETATION  CT CHEST

The following report is an over-read performed by radiologist Dr.
over-read does not include interpretation of cardiac or coronary
anatomy or pathology. The coronary CTA interpretation by the
cardiologist is attached.
FINDINGS: No significant noncardiac vascular findings. Visualized mediastinum
and hilar regions demonstrate no lymphadenopathy or masses. There is
a small hiatal hernia. Visualized lungs show no evidence of
pulmonary edema, consolidation, pneumothorax, nodule or pleural
fluid. Visualized upper abdomen demonstrates evidence of hepatic
steatosis. Visualized bony structures are unremarkable.
IMPRESSION: 1. Small hiatal hernia.
2. Evidence of hepatic steatosis.

*** End of Addendum ***
EXAM:
Cardiac/Coronary  CTA
:
A retrospective scan was triggered in the descending thoracic aorta.
Axial non-contrast 3 mm slices were carried out through the heart.
The data set was analyzed on a dedicated work station and scored
using the Agatson method. Gantry rotation speed was 330 msecs and
collimation was .6 mm. 100mg of metoprolol and 0.8 mg of sl NTG was
given. The 3D data set was reconstructed in 5% intervals of the
60-95 % of the R-R cycle. Diastolic phases were analyzed on a
dedicated work station using MPR, MIP and VRT modes. The patient
received 75 cc of contrast.
FINDINGS: Aorta: Normal size. Aortic root and descending aorta calcifications.
No dissection.

Aortic Valve:  Trileaflet.  No calcifications.

Coronary Arteries:  Normal coronary origin.  Right dominance.

RCA is a dominant artery that gives rise to PDA and PLA. There is
calcified plaque in the proximal segment causing mild stenosis
(25-49%).

Left main is a large artery that gives rise to LAD and LCX arteries.
There is no LM disease.

LAD has calcified plaque in the proximal segment causing severe
stenosis (>70%)

LCX is a non-dominant artery that gives rise to one two obtuse
marginal branches. There is calcified plaque in the proximal LCx
segment causing moderate stenosis (50%).

Other findings:

Normal pulmonary vein drainage into the left atrium.

Normal left atrial appendage without a thrombus.

Normal size of the pulmonary artery.
IMPRESSION: 1. Coronary calcium score of 902. This was 87th percentile for age
and sex matched control.

2. Normal coronary origin with right dominance.

3. Calcified plaque causing severe proximal LAD stenosis (>70%).

4. Calcified plaque causing moderate proximal LCx stenosis (50%).

5. CAD-RADS 4 Severe stenosis. (70-99% or > 50% left main). Cardiac
catheterization is recommended.

6. Additional analysis with CT FFR will be submitted and reported
separately.

## 2023-11-29 ENCOUNTER — Other Ambulatory Visit: Payer: Self-pay | Admitting: Family Medicine

## 2023-12-01 ENCOUNTER — Ambulatory Visit: Payer: PPO

## 2023-12-01 DIAGNOSIS — Z7901 Long term (current) use of anticoagulants: Secondary | ICD-10-CM

## 2023-12-01 LAB — POCT INR: INR: 3.6 — AB (ref 2.0–3.0)

## 2023-12-01 NOTE — Patient Instructions (Addendum)
Pre visit review using our clinic review tool, if applicable. No additional management support is needed unless otherwise documented below in the visit note.  Hold dose today and then continue 1 tablet daily except take 1/2 tablet on Tuesdays and Thursdays. Recheck in 3 weeks.

## 2023-12-01 NOTE — Progress Notes (Signed)
Pt reports she could have taken extra doses a few times due to her being out of town for several weeks.  Due to this no change in weekly dose will be made due to pt being very stable on this dose in past. Hold dose today and then continue 1 tablet daily except take 1/2 tablet on Tuesdays and Thursdays. Recheck in 3 weeks.

## 2023-12-22 ENCOUNTER — Telehealth: Payer: Self-pay

## 2023-12-22 ENCOUNTER — Ambulatory Visit: Payer: PPO

## 2023-12-22 NOTE — Telephone Encounter (Signed)
 Pt reports she has a premonition that she should not go out today and requested coumadin clinic apt to be Rs for next week. RS pt for next week and advised if any changes to contact the office. Pt verbalized understanding.

## 2023-12-26 ENCOUNTER — Other Ambulatory Visit: Payer: Self-pay | Admitting: Cardiology

## 2023-12-26 ENCOUNTER — Inpatient Hospital Stay: Payer: PPO

## 2023-12-26 ENCOUNTER — Other Ambulatory Visit: Payer: Self-pay | Admitting: Family Medicine

## 2023-12-26 ENCOUNTER — Inpatient Hospital Stay: Payer: PPO | Admitting: Internal Medicine

## 2023-12-27 ENCOUNTER — Telehealth: Payer: Self-pay | Admitting: Cardiovascular Disease

## 2023-12-27 ENCOUNTER — Other Ambulatory Visit: Payer: Self-pay | Admitting: Cardiovascular Disease

## 2023-12-27 ENCOUNTER — Other Ambulatory Visit: Payer: Self-pay | Admitting: Family Medicine

## 2023-12-27 DIAGNOSIS — Z7901 Long term (current) use of anticoagulants: Secondary | ICD-10-CM

## 2023-12-27 NOTE — Telephone Encounter (Signed)
 Pt is compliant with warfarin management and PCP apts.  Sent in refill of warfarin to requested pharmacy.

## 2023-12-27 NOTE — Telephone Encounter (Signed)
 Patient called to talk with Dr. Mariah Milling or nurse regarding medication. Would like to talk about taking losartan and about sending medication warfarin (COUMADIN) 5 MG tablet to pharmacy though not filled by Dr. Mariah Milling

## 2023-12-27 NOTE — Telephone Encounter (Signed)
 Returned the call to the patient. She stated that she is taking pantoprazole  20 mg bid but also wants to have omeprazole  prn. She stated that the omeprazole  works better for when she has a flair up of acid reflux in the middle of the night. She stated that Dr. Gollan advised that this was okay. She has been advised that we do not have that on her medication list.

## 2023-12-29 ENCOUNTER — Ambulatory Visit: Payer: PPO

## 2023-12-29 VITALS — BP 122/58 | HR 53

## 2023-12-29 DIAGNOSIS — Z7901 Long term (current) use of anticoagulants: Secondary | ICD-10-CM

## 2023-12-29 LAB — POCT INR: INR: 3.5 — AB (ref 2.0–3.0)

## 2023-12-29 NOTE — Patient Instructions (Addendum)
Pre visit review using our clinic review tool, if applicable. No additional management support is needed unless otherwise documented below in the visit note.  Hold dose today and then change weekly dose to take 1 tablet daily except take 1/2 tablet on Tuesdays and Thursdays. Recheck in 2 weeks.

## 2023-12-29 NOTE — Progress Notes (Addendum)
Pt reports very little appetite lately and not eating any greens lately. Pt also reports she is taking omeprazole PRN along with taking panoprazole daily.  Pt called after apt today to report she forgot but she had about 10 apples in the refrigerator which she made applesauce and also added raisins to the applesauce. These can interact with warfarin and increase INR.  Pt denies any bleeding. Advised if any s/s of bleeding or abnormal bruising to go to ER. Pt verbalized understanding.  Hold dose today and then change weekly dose to take 1 tablet daily except take 1/2 tablet on Tuesdays and Thursdays. Recheck in 2 weeks.  Pt requested BP check  BP today is 122/58, HR 53 and O2 sat 96% These readings are normal for this pt.

## 2023-12-30 NOTE — Telephone Encounter (Signed)
Called patient and notified her of the following from Dr. Mariah Milling.  She can purchase omeprazole over-the-counter  I would recommend if pantoprazole 20 twice daily is not covering her GERD symptoms she should talk with Dr. Para March  If omeprazole works better, perhaps he could change her to omeprazole 40 twice daily  Alternatively she can add Pepcid 1-2 pills as needed instead of omeprazole  Thx  TGollan   Patient verbalizes understanding.

## 2024-01-09 ENCOUNTER — Inpatient Hospital Stay: Payer: PPO

## 2024-01-09 ENCOUNTER — Inpatient Hospital Stay: Payer: PPO | Admitting: Internal Medicine

## 2024-01-12 ENCOUNTER — Inpatient Hospital Stay: Payer: PPO | Attending: Internal Medicine

## 2024-01-12 ENCOUNTER — Encounter: Payer: Self-pay | Admitting: Family Medicine

## 2024-01-12 ENCOUNTER — Ambulatory Visit: Payer: PPO

## 2024-01-12 ENCOUNTER — Inpatient Hospital Stay: Payer: PPO | Admitting: Internal Medicine

## 2024-01-12 ENCOUNTER — Ambulatory Visit: Payer: PPO | Attending: Family Medicine

## 2024-01-12 ENCOUNTER — Encounter: Payer: Self-pay | Admitting: Internal Medicine

## 2024-01-12 ENCOUNTER — Ambulatory Visit (INDEPENDENT_AMBULATORY_CARE_PROVIDER_SITE_OTHER): Payer: PPO | Admitting: Family Medicine

## 2024-01-12 VITALS — BP 130/87 | HR 54 | Temp 98.0°F | Resp 18 | Wt 222.0 lb

## 2024-01-12 VITALS — BP 132/74 | HR 55 | Temp 98.4°F | Ht 60.0 in | Wt 221.8 lb

## 2024-01-12 DIAGNOSIS — Z7901 Long term (current) use of anticoagulants: Secondary | ICD-10-CM | POA: Diagnosis not present

## 2024-01-12 DIAGNOSIS — D509 Iron deficiency anemia, unspecified: Secondary | ICD-10-CM

## 2024-01-12 DIAGNOSIS — Z803 Family history of malignant neoplasm of breast: Secondary | ICD-10-CM | POA: Insufficient documentation

## 2024-01-12 DIAGNOSIS — M545 Low back pain, unspecified: Secondary | ICD-10-CM | POA: Diagnosis not present

## 2024-01-12 DIAGNOSIS — K219 Gastro-esophageal reflux disease without esophagitis: Secondary | ICD-10-CM | POA: Diagnosis not present

## 2024-01-12 DIAGNOSIS — R42 Dizziness and giddiness: Secondary | ICD-10-CM

## 2024-01-12 DIAGNOSIS — N183 Chronic kidney disease, stage 3 unspecified: Secondary | ICD-10-CM

## 2024-01-12 DIAGNOSIS — Z87891 Personal history of nicotine dependence: Secondary | ICD-10-CM | POA: Insufficient documentation

## 2024-01-12 DIAGNOSIS — I480 Paroxysmal atrial fibrillation: Secondary | ICD-10-CM | POA: Diagnosis not present

## 2024-01-12 DIAGNOSIS — I509 Heart failure, unspecified: Secondary | ICD-10-CM | POA: Diagnosis not present

## 2024-01-12 DIAGNOSIS — I4891 Unspecified atrial fibrillation: Secondary | ICD-10-CM | POA: Insufficient documentation

## 2024-01-12 DIAGNOSIS — E785 Hyperlipidemia, unspecified: Secondary | ICD-10-CM

## 2024-01-12 DIAGNOSIS — D631 Anemia in chronic kidney disease: Secondary | ICD-10-CM | POA: Diagnosis not present

## 2024-01-12 DIAGNOSIS — D508 Other iron deficiency anemias: Secondary | ICD-10-CM

## 2024-01-12 DIAGNOSIS — Z79899 Other long term (current) drug therapy: Secondary | ICD-10-CM | POA: Insufficient documentation

## 2024-01-12 LAB — CBC WITH DIFFERENTIAL/PLATELET
Abs Immature Granulocytes: 0.03 10*3/uL (ref 0.00–0.07)
Basophils Absolute: 0.1 10*3/uL (ref 0.0–0.1)
Basophils Relative: 1 %
Eosinophils Absolute: 0.4 10*3/uL (ref 0.0–0.5)
Eosinophils Relative: 4 %
HCT: 39.1 % (ref 36.0–46.0)
Hemoglobin: 13 g/dL (ref 12.0–15.0)
Immature Granulocytes: 0 %
Lymphocytes Relative: 15 %
Lymphs Abs: 1.3 10*3/uL (ref 0.7–4.0)
MCH: 30.7 pg (ref 26.0–34.0)
MCHC: 33.2 g/dL (ref 30.0–36.0)
MCV: 92.2 fL (ref 80.0–100.0)
Monocytes Absolute: 0.6 10*3/uL (ref 0.1–1.0)
Monocytes Relative: 7 %
Neutro Abs: 6.5 10*3/uL (ref 1.7–7.7)
Neutrophils Relative %: 73 %
Platelets: 276 10*3/uL (ref 150–400)
RBC: 4.24 MIL/uL (ref 3.87–5.11)
RDW: 13.5 % (ref 11.5–15.5)
WBC: 8.9 10*3/uL (ref 4.0–10.5)
nRBC: 0 % (ref 0.0–0.2)

## 2024-01-12 LAB — IRON AND TIBC
Iron: 72 ug/dL (ref 28–170)
Saturation Ratios: 24 % (ref 10.4–31.8)
TIBC: 304 ug/dL (ref 250–450)
UIBC: 232 ug/dL

## 2024-01-12 LAB — POCT INR: INR: 1.6 — AB (ref 2.0–3.0)

## 2024-01-12 LAB — FERRITIN: Ferritin: 159 ng/mL (ref 11–307)

## 2024-01-12 MED ORDER — FAMOTIDINE 20 MG PO TABS: 20.0000 mg | ORAL_TABLET | Freq: Every day | ORAL | 1 refills | Status: AC | PRN

## 2024-01-12 NOTE — Patient Instructions (Addendum)
Go see Carollee Herter on the way out.  Try taking pepcid if needed for heartburn.  You should get the patch/monitor delivered to your house.  Take care.  Glad to see you. Recheck in about 3 months.

## 2024-01-12 NOTE — Progress Notes (Signed)
She is still using her 3 wheel roller.  She is checking on a replacement.    She isn't lightheaded at the OV.  Can happen after prolonged sitting, over the last month or so.  Not noted in the recliner.  No room spinning.  Taking gabapentin rarely- this doesn't overlap with the sx above.  Discussed not taking losartan given the goal to avoid low BPs.  She has checked her pulse and it was in the 50s during an episode.  She had event at OV for ~5 sec.  No motor or sensation changes.  Then it resolved. No speech changes.    She has anticoagulation clinic pending.    She is going to see hematology today.     She is off repatha- she couldn't give herself the injections.  Statin intolerant.    D/w pt about taking pantoprazole instead of prilosec given plavix use.  She could use prn famotidine, d/w pt.  Rx sent.    She has R lower back pain that doesn't radiate upon waking, can get up and get moving and then it gets better. Gabapentin used prn, with relief.    Meds, vitals, and allergies reviewed.   ROS: Per HPI unless specifically indicated in ROS section   GEN: nad, alert and oriented HEENT: ncat NECK: supple w/o LA CV: RRR PULM: ctab, no inc wob ABD: soft, +bs EXT: no edema SKIN: well perfused.   32 minutes were devoted to patient care in this encounter (this includes time spent reviewing the patient's file/history, interviewing and examining the patient, counseling/reviewing plan with patient).

## 2024-01-12 NOTE — Patient Instructions (Addendum)
Pre visit review using our clinic review tool, if applicable. No additional management support is needed unless otherwise documented below in the visit note.  Increase dose today to take 1 1/2 tablets and increase dose tomorrow to take 1 tablet and the continue 1 tablet daily except take 1/2 tablet on Monday, Wednesday and Friday. Recheck in 2 weeks.

## 2024-01-12 NOTE — Progress Notes (Signed)
Patient has been experiencing lightheadedness, which she feels like her whole left side is giving out, ongoing for about 8 months. She did say that since taking the oral iron pills she does feel better.

## 2024-01-12 NOTE — Progress Notes (Addendum)
Pt also had PCP apt today. Pt missed dose 4 days ago. Pt has not eaten any greens in the last two weeks.  Increase dose today to take 1 1/2 tablets and increase dose tomorrow to take 1 tablet and the continue 1 tablet daily except take 1/2 tablet on Monday, Wednesday and Friday. Recheck in 2 weeks.

## 2024-01-12 NOTE — Progress Notes (Signed)
[facility]  [phone] [phone]  ID: Julie Phillips OB: 05/17/1932  MR#: [id number]  AOZ#:[id number]  Patient Care Team: Joaquim Nam, MD as PCP - General (Family Medicine) Mariah Milling Tollie Pizza, MD as PCP - Cardiology (Cardiology) Antonieta Iba, MD as Consulting Physician (Cardiology) Aida Puffer, MD as Consulting Physician (Ophthalmology) Sallee Lange, MD as Consulting Physician (Ophthalmology) Kathyrn Sheriff, [facility] (Inactive) as Pharmacist (Pharmacist) Michaelyn Barter, MD as Consulting Physician (Oncology)    HPI: Julie Phillips is a 89 y.o. female with past medical CHF, hyperlipidemia, hypertension, hypothyroidism, A-fib on warfarin was referred to hematology for workup of anemia.  Patient reports feeling fatigued for past 3 weeks.  She also experiences shortness of breath at rest or when she is talking too much.  Interestingly, however patient is able to walk around the home without issue.  She also reports dizziness and is concerned that she may pass out anytime.  Patient presented to ED on 12/03/2022 after she had a fall and hit her head.  CT head was unremarkable.  She was admitted beginning of December 2023 for same symptoms of shortness of breath, fatigue, melena and dizziness.  Her hemoglobin was 7.6 and she received 1 unit of PRBC transfusion.  There was also concern for GI bleed.  Had EGD and colonoscopy which did not show any bleeding.  Her Plavix was held.  She continues to be on Coumadin.   Interval history Patient was seen today as follow-up for iron deficiency anemia. She continues to feel well.  Energy level has improved since the iron infusion and continues to be good.  Denies any bleeding in urine or stools.  REVIEW OF SYSTEMS:   ROS  As per HPI. Otherwise, a complete review of systems is negative.  PAST MEDICAL HISTORY: Past Medical History:  Diagnosis Date   Arrhythmia    atrial fibrillation   CHF  (congestive heart failure) (HCC)    Chronic heart failure with preserved ejection fraction (HFpEF) (HCC)    a. 01/2022 Echo: EF 65-70%, no rwma, mild LVH, GrII DD, nl RV fxn, mildly dil LA, mild MR, ao scelrosis.   Fall    GERD (gastroesophageal reflux disease)    History of chicken pox    Hx of colonic polyps    Hyperlipidemia    Hypertension    Hypothyroidism    Keratopathy 2018   R eye   LVH (left ventricular hypertrophy)    Persistent atrial fibrillation (HCC)    a. Initially dx in 2012; b. 01/2022 recurrent AF rvr noted-->warfarin added (CHA2DS2VASc = 5); c. 03/2022 Amio added-->DCCV x 1 (150J).    PAST SURGICAL HISTORY: Past Surgical History:  Procedure Laterality Date   CARDIOVASCULAR STRESS TEST  2011   normal    CARDIOVERSION N/A 04/02/2022   Procedure: CARDIOVERSION;  Surgeon: Antonieta Iba, MD;  Location: [facility] ORS;  Service: Cardiovascular;  Laterality: N/A;   COLONOSCOPY WITH PROPOFOL N/A 11/16/2022   Procedure: COLONOSCOPY WITH PROPOFOL;  Surgeon: Regis Bill, MD;  Location: [facility] ENDOSCOPY;  Service: Endoscopy;  Laterality: N/A;   CORONARY STENT INTERVENTION N/A 05/06/2022   Procedure: CORONARY STENT INTERVENTION;  Surgeon: Iran Ouch, MD;  Location: [facility] INVASIVE CV LAB;  Service: Cardiovascular;  Laterality: N/A;   ESOPHAGOGASTRODUODENOSCOPY (EGD) WITH PROPOFOL N/A 11/14/2022   Procedure: ESOPHAGOGASTRODUODENOSCOPY (EGD) WITH PROPOFOL;  Surgeon: Regis Bill, MD;  Location: [facility] ENDOSCOPY;  Service: Endoscopy;  Laterality: N/A;   keratopathy     LEFT  HEART CATH AND CORONARY ANGIOGRAPHY N/A 05/06/2022   Procedure: LEFT HEART CATH AND CORONARY ANGIOGRAPHY;  Surgeon: Iran Ouch, MD;  Location: [facility] INVASIVE CV LAB;  Service: Cardiovascular;  Laterality: N/A;    FAMILY HISTORY: Family History  Problem Relation Age of Onset   Heart disease Mother    Heart disease Father    Breast cancer Sister    Breast cancer Daughter    Colon cancer Neg  Hx     HEALTH MAINTENANCE: Social History   Tobacco Use   Smoking status: Former    Current packs/day: 0.00    Average packs/day: 2.0 packs/day for 20.0 years (40.0 ttl pk-yrs)    Types: Cigarettes    Start date: 12/14/1951    Quit date: 12/14/1971    Years since quitting: 52.1   Smokeless tobacco: Never  Vaping Use   Vaping status: Never Used  Substance Use Topics   Alcohol use: Not Currently   Drug use: No     Allergies  Allergen Reactions   Statins     Myalgias   Vitamin K And Related Shortness Of Breath   Wheat     Throat swelling   Ezetimibe Other (See Comments)    Extreme fatigue   Jardiance [Empagliflozin] Other (See Comments)    Dry mouth, "gasping for breath"   Alcohol     Other Reaction(s): Other (See Comments)  Reaction to hand sanitizer with fragrance. Wheezing, trouble breathing   Amlodipine     hematuria   Eucalyptus Oil    Lisinopril     Unrecalled BP med- possibly lisinopril- intolerant, had cough on the medicine- changed to cozaar   Rice Nausea And Vomiting    Current Outpatient Medications  Medication Sig Dispense Refill   amiodarone (PACERONE) 200 MG tablet TAKE ONE HALF (1/2) TABLET BY MOUTH DAILY 45 tablet 2   clopidogrel (PLAVIX) 75 MG tablet Take 75 mg by mouth daily.     diltiazem (CARDIZEM CD) 120 MG 24 hr capsule TAKE ONE CAPSULE BY MOUTH ONCE DAILY 90 capsule 3   famotidine (PEPCID) 20 MG tablet Take 1 tablet (20 mg total) by mouth daily as needed for heartburn or indigestion. 30 tablet 1   furosemide (LASIX) 20 MG tablet TAKE ONE TABLET BY MOUTH TWICE A DAY 180 tablet 3   gabapentin (NEURONTIN) 100 MG capsule TAKE 1 CAPSULE BY MOUTH 3 TIMES DAILY ASNEEDED 270 capsule 3   Iron, Ferrous Sulfate, 325 (65 Fe) MG TABS Take 325 mg by mouth every Monday, Wednesday, and Friday.     levothyroxine (SYNTHROID) 100 MCG tablet TAKE ONE TAB BY MOUTH ONCE DAILY. TAKE ON AN EMPTY STOMACH WITH A GLASS OF WATER ATLEAST 30-60 MINUTES BEFORE BREAKFAST 90  tablet 1   metoprolol succinate (TOPROL-XL) 50 MG 24 hr tablet TAKE ONE TABLET BY MOUTH ONCE A DAY 90 tablet 1   nitroGLYCERIN (NITROSTAT) 0.4 MG SL tablet Place 1 tablet (0.4 mg total) under the tongue every 5 (five) minutes as needed for chest pain. 25 tablet 3   pantoprazole (PROTONIX) 20 MG tablet TAKE ONE TABLET BY MOUTH TWICE (2) DAILY BEFORE A MEAL 180 tablet 2   Polyethyl Glycol-Propyl Glycol (SYSTANE) 0.4-0.3 % GEL ophthalmic gel Place 1 application into both eyes.     potassium chloride (KLOR-CON) 10 MEQ tablet TAKE ONE TABLET BY MOUTH ONCE A DAY 90 tablet 1   spironolactone (ALDACTONE) 25 MG tablet TAKE ONE TABLET BY MOUTH DAILY 30 tablet 6   traZODone (DESYREL) 50  MG tablet TAKE ONE HALF (1/2) TO ONE TABLET BY MOUTH AT BEDTIME AS NEEDED FOR SLEEP. 30 tablet 2   warfarin (COUMADIN) 5 MG tablet TAKE ONE TABLET BY MOUTH DAILY EXCEPT TAKE ONE-HALF (1/2) TABLET ON TUESDAYS AND THURSDAYS OR AS DIRECTED BY ANTICOAGULATION CLINIC 110 tablet 1   No current facility-administered medications for this visit.    OBJECTIVE: Vitals:   01/12/24 1429  BP: 130/87  Pulse: (!) 54  Resp: 18  Temp: 98 F (36.7 C)  SpO2: 95%     Body mass index is 43.36 kg/m.      General: Well-developed, well-nourished, no acute distress. Eyes: Pink conjunctiva, anicteric sclera. HEENT: Normocephalic, moist mucous membranes, clear oropharnyx. Lungs: Clear to auscultation bilaterally. Heart: Regular rate and rhythm. No rubs, murmurs, or gallops. Abdomen: Soft, nontender, nondistended. No organomegaly noted, normoactive bowel sounds. Musculoskeletal: No edema, cyanosis, or clubbing. Neuro: Alert, answering all questions appropriately. Cranial nerves grossly intact. Skin: No rashes or petechiae noted. Psych: Normal affect. Lymphatics: No cervical, calvicular, axillary or inguinal LAD.   LAB RESULTS:  Lab Results  Component Value Date   NA 138 08/11/2023   K 4.0 08/11/2023   CL 101 08/11/2023   CO2 30  08/11/2023   GLUCOSE 94 08/11/2023   BUN 19 08/11/2023   CREATININE 1.23 (H) 08/11/2023   CALCIUM 9.1 08/11/2023   PROT 6.6 08/11/2023   ALBUMIN 3.8 08/11/2023   AST 14 08/11/2023   ALT 8 08/11/2023   ALKPHOS 88 08/11/2023   BILITOT 0.4 08/11/2023   GFRNONAA 60 (L) 11/16/2022   GFRAA 90 03/25/2020    Lab Results  Component Value Date   WBC 8.9 01/12/2024   NEUTROABS 6.5 01/12/2024   HGB 13.0 01/12/2024   HCT 39.1 01/12/2024   MCV 92.2 01/12/2024   PLT 276 01/12/2024    Lab Results  Component Value Date   TIBC 304 01/12/2024   TIBC 328 06/24/2023   TIBC 442 02/11/2023   FERRITIN 159 01/12/2024   FERRITIN 142 06/24/2023   FERRITIN 33 02/11/2023   IRONPCTSAT 24 01/12/2024   IRONPCTSAT 31 06/24/2023   IRONPCTSAT 7 (L) 02/11/2023     STUDIES: No results found.  ASSESSMENT AND PLAN:   Julie Phillips is a 89 y.o. female with pmh of CHF, CAD s/p stent, hyperlipidemia, hypertension, hypothyroidism, A-fib on warfarin was referred to hematology for workup of anemia.  # CKD stage III # Iron deficiency anemia -Anemia likely secondary to iron deficiency and CKD stage III.  Repeat iron panel showed iron deficiency.  Status post IV Venofer completed April 5.  She has noticed significant improvement in her symptoms such as fatigue shortness of breath.  She was also treated with Aranesp injection 100 mcg x 2.  Repeat hemoglobin has improved to 11.7.  I will hold off on further Aranesp injections.   -She had EGD and colonoscopy in December 2023 which not show any bleeding source.  -Hemoglobin normal at 13.  Iron panel is normal.  No further indication for iron infusions.  Continue with oral iron 325 mg 3 times a week.  Tolerating well.  # A-fib s/p cardioversion # CHF # CAD status post stent -Follows with Dr. Mariah Milling.  On warfarin.  Having issues maintaining her INR.  Could not afford NOACs.  Was recently seen by cardiology and no changes made.   Orders Placed This Encounter   Procedures   Iron and TIBC([facility])   Ferritin   CBC with Differential (Cancer Center Only)  RTC in 6 months for MD visit, labs  Patient expressed understanding and was in agreement with this plan. She also understands that She can call clinic at any time with any questions, concerns, or complaints.   I spent a total of 30 minutes reviewing chart data, face-to-face evaluation with the patient, counseling and coordination of care as detailed above.  Michaelyn Barter, MD   01/12/2024 4:05 PM

## 2024-01-15 DIAGNOSIS — E785 Hyperlipidemia, unspecified: Secondary | ICD-10-CM | POA: Insufficient documentation

## 2024-01-15 DIAGNOSIS — M545 Low back pain, unspecified: Secondary | ICD-10-CM | POA: Insufficient documentation

## 2024-01-15 NOTE — Assessment & Plan Note (Signed)
D/w pt about taking pantoprazole instead of prilosec given plavix use.  She could use prn famotidine, d/w pt.  Rx sent.

## 2024-01-15 NOTE — Assessment & Plan Note (Signed)
She is off repatha- she couldn't give herself the injections.  Statin intolerant.

## 2024-01-15 NOTE — Assessment & Plan Note (Addendum)
With anticoagulation clinic f/u pending.   Unclear source for lightheadedness, d/w pt about checking ambulatory monitor.  This doesn't appear to be BP related.  No syncope.  Okay for outpatient f/u.

## 2024-01-15 NOTE — Assessment & Plan Note (Signed)
She has R lower back pain that doesn't radiate upon waking, can get up and get moving and then it gets better. Gabapentin used prn, with relief.  Continue as is.

## 2024-01-15 NOTE — Assessment & Plan Note (Signed)
 Per hematology

## 2024-01-18 ENCOUNTER — Ambulatory Visit: Payer: Self-pay | Admitting: Family Medicine

## 2024-01-18 NOTE — Telephone Encounter (Addendum)
 Patient needs assistance using the monitor that the office sent her. According to the notes from patient's last visit, the provider stated:  Unclear source for lightheadedness, d/w pt about checking ambulatory monitor. This doesn't appear to be BP related. No syncope. Okay for outpatient f/u .   You should get the patch/monitor delivered to your house.  Please follow up with patient in regards to the device.

## 2024-01-18 NOTE — Telephone Encounter (Signed)
 Spoke with patient and scheduled her to come in for a nurse visit on 01/19/24

## 2024-01-19 ENCOUNTER — Ambulatory Visit: Payer: PPO

## 2024-01-19 NOTE — Progress Notes (Signed)
 Patient came in because she needed assistance setting up her zio monitor. Successful set up

## 2024-01-26 ENCOUNTER — Ambulatory Visit: Payer: PPO

## 2024-01-26 DIAGNOSIS — Z7901 Long term (current) use of anticoagulants: Secondary | ICD-10-CM

## 2024-01-26 LAB — POCT INR: INR: 1.7 — AB (ref 2.0–3.0)

## 2024-01-26 NOTE — Patient Instructions (Addendum)
Pre visit review using our clinic review tool, if applicable. No additional management support is needed unless otherwise documented below in the visit note.  Increase dose today to take 1 1/2 tablets and then change weekly dose to take 1 tablet daily except take 1/2 tablet on Monday and Friday. Recheck in 2 weeks.

## 2024-01-26 NOTE — Progress Notes (Signed)
Increase dose today to take 1 1/2 tablets and then change weekly dose to take 1 tablet daily except take 1/2 tablet on Monday and Friday. Recheck in 2 weeks.

## 2024-02-01 DIAGNOSIS — I480 Paroxysmal atrial fibrillation: Secondary | ICD-10-CM | POA: Diagnosis not present

## 2024-02-01 DIAGNOSIS — R42 Dizziness and giddiness: Secondary | ICD-10-CM | POA: Diagnosis not present

## 2024-02-09 ENCOUNTER — Ambulatory Visit (INDEPENDENT_AMBULATORY_CARE_PROVIDER_SITE_OTHER): Payer: PPO

## 2024-02-09 DIAGNOSIS — Z7901 Long term (current) use of anticoagulants: Secondary | ICD-10-CM | POA: Diagnosis not present

## 2024-02-09 LAB — POCT INR: INR: 1.7 — AB (ref 2.0–3.0)

## 2024-02-09 NOTE — Patient Instructions (Addendum)
 Pre visit review using our clinic review tool, if applicable. No additional management support is needed unless otherwise documented below in the visit note.  Increase dose today to take 1 1/2 tablets and then change weekly dose to take 1 tablet daily except take 1/2 tablet on Friday. Recheck in 2 weeks.

## 2024-02-09 NOTE — Progress Notes (Signed)
 Increase dose today to take 1 1/2 tablets and then change weekly dose to take 1 tablet daily except take 1/2 tablet on Friday. Recheck in 2 weeks.

## 2024-02-17 ENCOUNTER — Other Ambulatory Visit: Payer: Self-pay | Admitting: Family Medicine

## 2024-02-23 ENCOUNTER — Ambulatory Visit (INDEPENDENT_AMBULATORY_CARE_PROVIDER_SITE_OTHER): Payer: PPO

## 2024-02-23 DIAGNOSIS — Z7901 Long term (current) use of anticoagulants: Secondary | ICD-10-CM

## 2024-02-23 LAB — POCT INR: INR: 3.1 — AB (ref 2.0–3.0)

## 2024-02-23 NOTE — Patient Instructions (Addendum)
 Pre visit review using our clinic review tool, if applicable. No additional management support is needed unless otherwise documented below in the visit note.  Reduce dose today to take 1/2 tablet and then continue 1 tablet daily except take 1/2 tablet on Friday. Recheck in 3 weeks.

## 2024-02-23 NOTE — Progress Notes (Signed)
 Reduce dose today to take 1/2 tablet and then continue 1 tablet daily except take 1/2 tablet on Friday. Recheck in 3 weeks.

## 2024-03-15 ENCOUNTER — Ambulatory Visit (INDEPENDENT_AMBULATORY_CARE_PROVIDER_SITE_OTHER)

## 2024-03-15 DIAGNOSIS — Z7901 Long term (current) use of anticoagulants: Secondary | ICD-10-CM | POA: Diagnosis not present

## 2024-03-15 LAB — POCT INR: INR: 2 (ref 2.0–3.0)

## 2024-03-15 NOTE — Progress Notes (Signed)
 Pt missed dose yesterday. Due to missing this dose and being at the lowest end of th INR range, will add 1/2 tablet dose to take today.  Increase dose today to take 1 1/2 tablet and then continue 1 tablet daily except take 1/2 tablet on Friday. Recheck in 3 weeks.

## 2024-03-15 NOTE — Patient Instructions (Addendum)
 Pre visit review using our clinic review tool, if applicable. No additional management support is needed unless otherwise documented below in the visit note.  Increase dose today to take 1 1/2 tablet and then continue 1 tablet daily except take 1/2 tablet on Friday. Recheck in 3 weeks.

## 2024-04-03 ENCOUNTER — Telehealth: Payer: Self-pay

## 2024-04-03 NOTE — Telephone Encounter (Signed)
 Pt reports she does not feel safe driving anymore and has had a friend bring her to apts but that friend has moved to [place] . She has someone else who volunteered to take her to apt this Thursday but needed a time of 12:30. RS coumadin  clinic apt for 12:30. Pt appreciative and verbalized understanding.

## 2024-04-03 NOTE — Telephone Encounter (Signed)
 Pt LVM requesting call back.  Tried to contact pt but had to leave a VM. Next coumadin  clinic apt is 4/24.

## 2024-04-05 ENCOUNTER — Telehealth: Payer: Self-pay

## 2024-04-05 ENCOUNTER — Ambulatory Visit

## 2024-04-05 DIAGNOSIS — Z7901 Long term (current) use of anticoagulants: Secondary | ICD-10-CM | POA: Diagnosis not present

## 2024-04-05 LAB — POCT INR: INR: 3.7 — AB (ref 2.0–3.0)

## 2024-04-05 NOTE — Patient Instructions (Addendum)
 Pre visit review using our clinic review tool, if applicable. No additional management support is needed unless otherwise documented below in the visit note.  Hold dose today and then change weekly dose to take 1 tablet daily except take 1/2 tablet on Monday and Friday. Recheck in 3 weeks.

## 2024-04-05 NOTE — Telephone Encounter (Signed)
 Pt in today for coumadin  clinic and brought [employer] paperwork requesting provider to complete so she can get assistance with transportation. Pt needs assistance with transportation now due to declining vision. Pt reports one eye is blind and the other is very blurry. She can no longer drive her own car. Pt is requesting the paperwork be completed as soon as possible so she can start using this transportation because she does not currently have any transportation.   Completed some of the questions on the form. Need PCP to complete the rest and sign and date. Pt also provided an envelope that the paperwork is to be mailed in, already addressed to [employer].  Gave paperwork to PCP for completion.

## 2024-04-05 NOTE — Telephone Encounter (Signed)
 I will work on [employer].  Thanks.

## 2024-04-05 NOTE — Progress Notes (Signed)
 Pt denies any changes. Hold dose today and then change weekly dose to take 1 tablet daily except take 1/2 tablet on Monday and Friday. Recheck in 3 weeks.

## 2024-04-09 NOTE — Telephone Encounter (Signed)
 Spoke with patient to advised that paperwork has been completed and mailed. In addition I am sending her a copy as well

## 2024-04-12 ENCOUNTER — Other Ambulatory Visit: Payer: Self-pay | Admitting: Family Medicine

## 2024-04-26 ENCOUNTER — Ambulatory Visit

## 2024-04-26 DIAGNOSIS — Z7901 Long term (current) use of anticoagulants: Secondary | ICD-10-CM

## 2024-04-26 LAB — POCT INR: INR: 2.2 (ref 2.0–3.0)

## 2024-04-26 NOTE — Progress Notes (Signed)
 Continue 1 tablet daily except take 1/2 tablet on Monday and Friday. Recheck in 4 weeks.

## 2024-04-26 NOTE — Patient Instructions (Addendum)
 Pre visit review using our clinic review tool, if applicable. No additional management support is needed unless otherwise documented below in the visit note.  Continue 1 tablet daily except take 1/2 tablet on Monday and Friday. Recheck in 4 weeks.

## 2024-05-18 ENCOUNTER — Ambulatory Visit: Admitting: Cardiology

## 2024-05-22 NOTE — Progress Notes (Signed)
 This encounter was created in error - please disregard.

## 2024-05-24 ENCOUNTER — Ambulatory Visit

## 2024-05-24 DIAGNOSIS — Z7901 Long term (current) use of anticoagulants: Secondary | ICD-10-CM

## 2024-05-24 LAB — POCT INR: INR: 4 — AB (ref 2.0–3.0)

## 2024-05-24 NOTE — Progress Notes (Addendum)
 Pt reports she thinks she could have taken double doses on two separate days in the last two weeks. Pt also has been eating a lot of cherries. This can interact with warfarin and can increase INR per prior hx. Advised pt of this interaction and educated to use anticoagulation calendar to mark when she has already taken her warfarin. Pt verbalized understanding.  Hold dose today and hold dose tomorrow and then change weekly dose take 1 tablet daily except take 1/2 tablet on Monday, Wednesday and Friday. Recheck in 4 weeks per pt request due to lack of transportation.

## 2024-05-24 NOTE — Patient Instructions (Addendum)
 Pre visit review using our clinic review tool, if applicable. No additional management support is needed unless otherwise documented below in the visit note.  Hold dose today and hold dose tomorrow and then change weekly dose take 1 tablet daily except take 1/2 tablet on Monday, Wednesday and Friday. Recheck in 4 weeks

## 2024-05-30 ENCOUNTER — Ambulatory Visit: Admitting: Medical

## 2024-05-30 DIAGNOSIS — H353221 Exudative age-related macular degeneration, left eye, with active choroidal neovascularization: Secondary | ICD-10-CM | POA: Diagnosis not present

## 2024-05-30 DIAGNOSIS — H18421 Band keratopathy, right eye: Secondary | ICD-10-CM | POA: Diagnosis not present

## 2024-06-01 ENCOUNTER — Encounter: Payer: Self-pay | Admitting: Nurse Practitioner

## 2024-06-01 ENCOUNTER — Ambulatory Visit: Attending: Nurse Practitioner | Admitting: Nurse Practitioner

## 2024-06-01 VITALS — BP 122/64 | HR 53 | Ht 61.0 in | Wt 222.6 lb

## 2024-06-01 DIAGNOSIS — I480 Paroxysmal atrial fibrillation: Secondary | ICD-10-CM | POA: Diagnosis not present

## 2024-06-01 DIAGNOSIS — I4819 Other persistent atrial fibrillation: Secondary | ICD-10-CM | POA: Diagnosis not present

## 2024-06-01 DIAGNOSIS — I25118 Atherosclerotic heart disease of native coronary artery with other forms of angina pectoris: Secondary | ICD-10-CM | POA: Diagnosis not present

## 2024-06-01 DIAGNOSIS — E039 Hypothyroidism, unspecified: Secondary | ICD-10-CM | POA: Diagnosis not present

## 2024-06-01 DIAGNOSIS — E782 Mixed hyperlipidemia: Secondary | ICD-10-CM

## 2024-06-01 DIAGNOSIS — I10 Essential (primary) hypertension: Secondary | ICD-10-CM

## 2024-06-01 NOTE — Progress Notes (Addendum)
 Office Visit    Patient Name: Julie Phillips Date of Encounter: 06/01/2024  Primary Care Provider:  Donnie Galea, MD Primary Cardiologist:  Belva Boyden, MD  Chief Complaint    89 y.o. female with a history of CAD status post stenting of the LAD in 2023, persistent atrial fibrillation, hypertension, hyperlipidemia, hypothyroidism, frequent falls, and obesity, who presents for follow-up related to CAD and Afib.   Past Medical History   Subjective   Past Medical History:  Diagnosis Date   CAD (coronary artery disease)    a. 04/2022 Cor CTA: sev mid-dist LAD dzs; b. 04/2022 PCI: LM nl, LAD 24m (3.0x26 onyx Frontier DES), 62m/d, LCX nl, OM1 small, OM2 99 small, RCA mild diff dzs.   Chronic heart failure with preserved ejection fraction (HFpEF) (HCC)    a. 01/2022 Echo: EF 65-70%, no rwma, mild LVH, GrII DD, nl RV fxn, mildly dil LA, mild MR, ao scelrosis.   Fall    GERD (gastroesophageal reflux disease)    History of chicken pox    Hx of colonic polyps    Hyperlipidemia    Hypertension    Hypothyroidism    Keratopathy 2018   R eye   LVH (left ventricular hypertrophy)    Persistent atrial fibrillation (HCC)    a. Initially dx in 2012; b. 01/2022 recurrent AF rvr noted-->warfarin added (CHA2DS2VASc = 5); c. 03/2022 Amio added-->DCCV x 1 (150J); d. 12/2023 Zio: Predominantly sinus rhythm, average 54 (43-136).  4 runs of SVT, longest 7 beats.  Rare PACs and PVCs.  No sustained arrhythmias.   Past Surgical History:  Procedure Laterality Date   CARDIOVASCULAR STRESS TEST  2011   normal    CARDIOVERSION N/A 04/02/2022   Procedure: CARDIOVERSION;  Surgeon: Julie Fonder, MD;  Location: [facility] OR[facility];  Service: Cardiovascular;  Laterality: N/A;   COLONOSCOPY WITH PROPOFOL  N/A 11/16/2022   Procedure: COLONOSCOPY WITH PROPOFOL ;  Surgeon: Julie Darling, MD;  Location: [facility];  Service: Endoscopy;  Laterality: N/A;   CORONARY STENT INTERVENTION N/A 05/06/2022   Procedure:  CORONARY STENT INTERVENTION;  Surgeon: Julie Hamilton, MD;  Location: [facility];  Service: Cardiovascular;  Laterality: N/A;   ESOPHAGOGASTRODUODENOSCOPY (EGD) WITH PROPOFOL  N/A 11/14/2022   Procedure: ESOPHAGOGASTRODUODENOSCOPY (EGD) WITH PROPOFOL ;  Surgeon: Julie Darling, MD;  Location: [facility];  Service: Endoscopy;  Laterality: N/A;   keratopathy     LEFT HEART CATH AND CORONARY ANGIOGRAPHY N/A 05/06/2022   Procedure: LEFT HEART CATH AND CORONARY ANGIOGRAPHY;  Surgeon: Julie Hamilton, MD;  Location: [facility];  Service: Cardiovascular;  Laterality: N/A;    Allergies  Allergies  Allergen Reactions   Statins     Myalgias   Vitamin K And Related Shortness Of Breath   Wheat     Throat swelling   Ezetimibe  Other (See Comments)    Extreme fatigue   Jardiance [Empagliflozin] Other (See Comments)    Dry mouth, gasping for breath   Alcohol      Other Reaction(s): Other (See Comments)  Reaction to hand sanitizer with fragrance. Wheezing, trouble breathing   Amlodipine     hematuria   Eucalyptus Oil    Lisinopril     Unrecalled BP med- possibly lisinopril- intolerant, had cough on the medicine- changed to cozaar    Rice Nausea And Vomiting       History of Present Illness      89 y.o. y/o female with a history of CAD, persistent atrial  fibrillation, hypertension, hyperlipidemia, hypothyroidism, frequent falls, and obesity.  She was initially diagnosed with atrial fibrillation in [place]  in the setting of hypokalemia in 2007.  Echo at that time showed preserved EF with mild MR and TR.  Stress testing was nonischemic.  She was initially placed on warfarin but this was discontinued due to frequent falls.  In February 2023, she was admitted to [facility] with chest pressure and mild high-sensitivity troponin elevation in the setting of A-fib with RVR.  Echo showed an EF of 65 to 70% without regional wall motion abnormalities, mild LVH, grade  2 diastolic dysfunction, mild MR, and aortic valve sclerosis.  The left atrium was mildly dilated.  She was placed back on warfarin (CHA2DS2-VASc at least 5) in the setting of improved balance at home.  She converted to sinus rhythm but had return of atrial fibrillation in April 2023 requiring rehospitalization for RVR.  She was placed on amiodarone  and underwent repeat cardioversion.    She underwent coronary CT angiogram in May 2023 which suggested severe LAD and circumflex disease.  Diagnostic catheterization was performed and showed severe, 90% mid LAD disease which was successfully treated with a drug-eluting stent.  Showed a small OM 2 which was subtotally occluded and has been managed medically.  In 11/2022, she developed dyspnea and anemia w/ Hgb of 7.6.  She required admission and GI eval (warfarin briefly held).  EGD at that time suggested AVMs.   [name] was last seen in cardiology clinic in November 2024, at which time she was maintaining sinus rhythm and doing well.  For the most part, she has cont to do well.  She is unaware of any recurrence of Afib or palpitations and does not experience dyspnea.  She occasionally notes a sharp, focal, and fleeting chest pain that might occur anywhere across her precordium or on her arms, but not with any rhyme or reason.  She does not have any exertional or prolonged systems.  She has been having issues w/ her vision and has f/u planned w/ ophthalmology.  She was recently told that she may have some bleeding in her left eye.  She is frustrated w/ widely fluctuating INRs and is interested in switching to eliquis.  She denies pnd, orthopnea, n, v, dizziness, syncope, or early satiety.  She sometimes notes mild ankle edema at the end of the day.  Objective   Home Medications    Current Outpatient Medications  Medication Sig Dispense Refill   amiodarone  (PACERONE ) 200 MG tablet TAKE ONE HALF (1/2) TABLET BY MOUTH DAILY 45 tablet 2   clopidogrel   (PLAVIX ) 75 MG tablet Take 75 mg by mouth daily.     diltiazem  (CARDIZEM  CD) 120 MG 24 hr capsule TAKE ONE CAPSULE BY MOUTH ONCE DAILY 90 capsule 3   famotidine  (PEPCID ) 20 MG tablet Take 1 tablet (20 mg total) by mouth daily as needed for heartburn or indigestion. 30 tablet 1   furosemide  (LASIX ) 20 MG tablet TAKE ONE TABLET BY MOUTH TWICE A DAY 180 tablet 3   gabapentin  (NEURONTIN ) 100 MG capsule TAKE 1 CAPSULE BY MOUTH 3 TIMES DAILY ASNEEDED 270 capsule 3   Iron , Ferrous Sulfate , 325 (65 Fe) MG TABS Take 325 mg by mouth every Monday, Wednesday, and Friday.     levothyroxine  (SYNTHROID ) 100 MCG tablet TAKE ONE TAB BY MOUTH ONCE DAILY. TAKE ON AN EMPTY STOMACH WITH A GLASS OF WATER ATLEAST 30-60 MINUTES BEFORE BREAKFAST 90 tablet 1   metoprolol  succinate (TOPROL -XL)  50 MG 24 hr tablet TAKE ONE TABLET BY MOUTH ONCE A DAY 90 tablet 1   nitroGLYCERIN  (NITROSTAT ) 0.4 MG SL tablet Place 1 tablet (0.4 mg total) under the tongue every 5 (five) minutes as needed for chest pain. 25 tablet 3   pantoprazole  (PROTONIX ) 20 MG tablet TAKE ONE TABLET BY MOUTH TWICE (2) DAILY BEFORE A MEAL 180 tablet 2   Polyethyl Glycol-Propyl Glycol (SYSTANE) 0.4-0.3 % GEL ophthalmic gel Place 1 application into both eyes.     potassium chloride  (KLOR-CON ) 10 MEQ tablet TAKE ONE TABLET BY MOUTH ONCE A DAY 90 tablet 1   spironolactone  (ALDACTONE ) 25 MG tablet TAKE ONE TABLET BY MOUTH DAILY 30 tablet 6   traZODone  (DESYREL ) 50 MG tablet TAKE ONE HALF (1/2) TO ONE TABLET BY MOUTH AT BEDTIME AS NEEDED FOR SLEEP. 30 tablet 2   warfarin (COUMADIN ) 5 MG tablet TAKE ONE TABLET BY MOUTH DAILY EXCEPT TAKE ONE-HALF (1/2) TABLET ON TUESDAYS AND THURSDAYS OR AS DIRECTED BY ANTICOAGULATION CLINIC 110 tablet 1   No current facility-administered medications for this visit.     Physical Exam    VS:  BP 122/64 (BP Location: Left Arm, Patient Position: Sitting, Cuff Size: Large)   Pulse (!) 53   Ht 5' 1 (1.549 m)   Wt 222 lb 9.6 oz (101  kg)   SpO2 94%   BMI 42.06 kg/m  , BMI Body mass index is 42.06 kg/m.    STOP-Bang Score:           GEN: Well nourished, well developed, in no acute distress. HEENT: normal. Neck: Supple, no JVD, carotid bruits, or masses. Cardiac: RRR, 1/6 syst murmur @ upper sternal borders, no rubs or gallops. No clubbing, cyanosis, edema.  Radials 2+/PT 2+ and equal bilaterally.  Respiratory:  Respirations regular and unlabored, clear to auscultation bilaterally. GI: Soft, nontender, nondistended, BS + x 4. MS: no deformity or atrophy. Skin: warm and dry, no rash. Neuro:  Strength and sensation are intact. Psych: Normal affect.  Accessory Clinical Findings    ECG personally reviewed by me today - EKG Interpretation Date/Time:  Friday June 01 2024 08:03:03 EDT Ventricular Rate:  53 PR Interval:  188 QRS Duration:  86 QT Interval:  478 QTC Calculation: 448 R Axis:   38  Text Interpretation: Sinus bradycardia Confirmed by Laneta Pintos [phone]) on 06/01/2024 8:12:33 AM  - no acute changes.  Lab Results  Component Value Date   WBC 8.9 01/12/2024   HGB 13.0 01/12/2024   HCT 39.1 01/12/2024   MCV 92.2 01/12/2024   PLT 276 01/12/2024   Lab Results  Component Value Date   CREATININE 1.23 (H) 08/11/2023   BUN 19 08/11/2023   NA 138 08/11/2023   K 4.0 08/11/2023   CL 101 08/11/2023   CO2 30 08/11/2023   Lab Results  Component Value Date   ALT 8 08/11/2023   AST 14 08/11/2023   ALKPHOS 88 08/11/2023   BILITOT 0.4 08/11/2023   Lab Results  Component Value Date   CHOL 238 (H) 08/12/2022   HDL 54.40 08/12/2022   LDLCALC 161 (H) 08/12/2022   LDLDIRECT 152.6 12/31/2013   TRIG 112.0 08/12/2022   CHOLHDL 4 08/12/2022     Lab Results  Component Value Date   TSH 1.74 08/11/2023       Assessment & Plan    1.  CAD:  s/p PCI/DES to the LAD in 04/2022.  She has since done well and denies c/p or  dyspnea.  She remains on ? blocker and plavix .  No statin/zetia  in the setting of  prior intolerance.  Tried repatha  but struggled to inject herself and therefor she has been off for several months.  Likely a good candidate for leqvio but lacks transportation to [place] for infusion clinic - will need to keep this in mind once we have ability to provide this in [place].  2.  Persistent Afib:  S/p prior DCCV in 2023.  No known recurrence on low-dose amio - 100 daily.  She is frustrated by fluctuating INRs and is interested in transitioning to eliquis.  I will f/u labs today to re-eval H/H and renal fxn.  Provided tat labs are stable, will look to transition to eliquis. Cont ? blocker, and dilt.  F/u lfts/TSH (amio).  3.  Primary HTN: Stable on ? blocker, ccb, lasix , spiro.  4.  HL:  As above, prev intolerant to statin and zetia .  Struggled to give herself repatha  injections.  Consider leqvio once available in [facility], or bempedoic acid if she'd be willing to try.  5.  Hypothyroidism:  on levothyroxine .  TSH nl last August - f/u.  6.  Visual disturbance:  Pt w/ L eye visual disturbance pending ophthalmology eval.  She was told that she has had ocular bleeding.  If necessary, she may hold anticoagulation without bridging for any planned procedures.  7.  Disposition:  f/u labs today w/ plan to start eliquis if stable.  F/u labs 30 days after starting eliquis.  Laneta Pintos, NP 06/01/2024, 8:17 AM

## 2024-06-01 NOTE — Patient Instructions (Signed)
 Medication Instructions:  STOP Coumadin   *If you need a refill on your cardiac medications before your next appointment, please call your pharmacy*  Lab Work: Your provider would like for you to have the following labs today: CBC and BMET  If you have labs (blood work) drawn today and your tests are completely normal, you will receive your results only by: MyChart Message (if you have MyChart) OR A paper copy in the mail If you have any lab test that is abnormal or we need to change your treatment, we will call you to review the results.  Testing/Procedures: None ordered  Follow-Up: At [facility], you and your health needs are our priority.  As part of our continuing mission to provide you with exceptional heart care, our providers are all part of one team.  This team includes your primary Cardiologist (physician) and Advanced Practice Providers or APPs (Physician Assistants and Nurse Practitioners) who all work together to provide you with the care you need, when you need it.  Your next appointment:   6 month(s)  Provider:   You may see Timothy Gollan, MD or one of the following Advanced Practice Providers on your designated Care Team:   Ronald Cockayne, NP   We recommend signing up for the patient portal called MyChart.  Sign up information is provided on this After Visit Summary.  MyChart is used to connect with patients for Virtual Visits (Telemedicine).  Patients are able to view lab/test results, encounter notes, upcoming appointments, etc.  Non-urgent messages can be sent to your provider as well.   To learn more about what you can do with MyChart, go to [email or web address].

## 2024-06-02 LAB — BASIC METABOLIC PANEL WITH GFR
BUN/Creatinine Ratio: 19 (ref 12–28)
BUN: 22 mg/dL (ref 8–27)
CO2: 21 mmol/L (ref 20–29)
Calcium: 9.4 mg/dL (ref 8.7–10.3)
Chloride: 102 mmol/L (ref 96–106)
Creatinine, Ser: 1.15 mg/dL — ABNORMAL HIGH (ref 0.57–1.00)
Glucose: 89 mg/dL (ref 70–99)
Potassium: 4.3 mmol/L (ref 3.5–5.2)
Sodium: 141 mmol/L (ref 134–144)
eGFR: 46 mL/min/{1.73_m2} — ABNORMAL LOW (ref >59)

## 2024-06-02 LAB — CBC
Hematocrit: 40.9 % (ref 34.0–46.6)
Hemoglobin: 13.4 g/dL (ref 11.1–15.9)
MCH: 30.9 pg (ref 26.6–33.0)
MCHC: 32.8 g/dL (ref 31.5–35.7)
MCV: 95 fL (ref 79–97)
Platelets: 263 10*3/uL (ref 150–450)
RBC: 4.33 x10E6/uL (ref 3.77–5.28)
RDW: 13.5 % (ref 11.7–15.4)
WBC: 8.4 10*3/uL (ref 3.4–10.8)

## 2024-06-04 ENCOUNTER — Ambulatory Visit: Payer: Self-pay | Admitting: Nurse Practitioner

## 2024-06-04 DIAGNOSIS — H353221 Exudative age-related macular degeneration, left eye, with active choroidal neovascularization: Secondary | ICD-10-CM | POA: Diagnosis not present

## 2024-06-04 MED ORDER — APIXABAN 5 MG PO TABS: 5.0000 mg | ORAL_TABLET | Freq: Two times a day (BID) | ORAL | 6 refills | Status: DC

## 2024-06-06 ENCOUNTER — Other Ambulatory Visit: Payer: Self-pay | Admitting: Cardiovascular Disease

## 2024-06-07 ENCOUNTER — Ambulatory Visit

## 2024-06-18 DIAGNOSIS — H43812 Vitreous degeneration, left eye: Secondary | ICD-10-CM | POA: Diagnosis not present

## 2024-06-18 DIAGNOSIS — H353221 Exudative age-related macular degeneration, left eye, with active choroidal neovascularization: Secondary | ICD-10-CM | POA: Diagnosis not present

## 2024-06-18 DIAGNOSIS — H18421 Band keratopathy, right eye: Secondary | ICD-10-CM | POA: Diagnosis not present

## 2024-06-18 DIAGNOSIS — Z961 Presence of intraocular lens: Secondary | ICD-10-CM | POA: Diagnosis not present

## 2024-06-20 ENCOUNTER — Telehealth: Payer: Self-pay

## 2024-06-20 ENCOUNTER — Ambulatory Visit: Payer: Self-pay

## 2024-06-20 NOTE — Telephone Encounter (Signed)
 Pt reports she has transitioned from warfarin to Eliquis  per cardiology, due to her difficulty with transportation to coumadin  clinic apts.  Advised pt if anything is needed in the future feel free to contact the coumadin  clinic. Pt appreciative and verbalized understanding.   Resolved anticoagulation episodes.

## 2024-06-20 NOTE — Telephone Encounter (Signed)
 Noted. Thanks.

## 2024-06-20 NOTE — Progress Notes (Signed)
 Pt has transitioned from warfarin to Eliquis .

## 2024-06-21 ENCOUNTER — Ambulatory Visit

## 2024-06-27 ENCOUNTER — Other Ambulatory Visit: Payer: Self-pay | Admitting: Family Medicine

## 2024-07-02 ENCOUNTER — Ambulatory Visit (INDEPENDENT_AMBULATORY_CARE_PROVIDER_SITE_OTHER): Payer: PPO

## 2024-07-02 VITALS — BP 122/64 | Ht 61.0 in | Wt 220.0 lb

## 2024-07-02 DIAGNOSIS — Z0000 Encounter for general adult medical examination without abnormal findings: Secondary | ICD-10-CM

## 2024-07-02 DIAGNOSIS — Z2821 Immunization not carried out because of patient refusal: Secondary | ICD-10-CM

## 2024-07-02 NOTE — Progress Notes (Signed)
 Because this visit was a virtual/telehealth visit,  certain criteria was not obtained, such a blood pressure, CBG if applicable, and timed get up and go. Any medications not marked as taking were not mentioned during the medication reconciliation part of the visit. Any vitals not documented were not able to be obtained due to this being a telehealth visit or patient was unable to self-report a recent blood pressure reading due to a lack of equipment at home via telehealth. Vitals that have been documented are verbally provided by the patient.  This visit was performed by a medical professional under my direct supervision. I was immediately available for consultation/collaboration. I have reviewed and agree with the Annual Wellness Visit documentation.  Subjective:   Julie Phillips is a 89 y.o. who presents for a Medicare Wellness preventive visit.  As a reminder, Annual Wellness Visits don't include a physical exam, and some assessments may be limited, especially if this visit is performed virtually. We may recommend an in-person follow-up visit with your provider if needed.  Visit Complete: Virtual I connected with  Julie Phillips on 07/02/24 by a audio enabled telemedicine application and verified that I am speaking with the correct person using two identifiers.  Patient Location: Home  Provider Location: Home Office  I discussed the limitations of evaluation and management by telemedicine. The patient expressed understanding and agreed to proceed.  Vital Signs: Because this visit was a virtual/telehealth visit, some criteria may be missing or patient reported. Any vitals not documented were not able to be obtained and vitals that have been documented are patient reported.  VideoDeclined- This patient declined Librarian, academic. Therefore the visit was completed with audio only.  Persons Participating in Visit: Patient.  AWV Questionnaire: No: Patient Medicare  AWV questionnaire was not completed prior to this visit.  Cardiac Risk Factors include: advanced age (>54men, >30 women);Other (see comment);hypertension;obesity (BMI >30kg/m2)     Objective:    Today's Vitals   07/02/24 1319  BP: 122/64  Weight: 220 lb (99.8 kg)  Height: 5' 1 (1.549 m)   Body mass index is 41.57 kg/m.     07/02/2024    1:18 PM 01/12/2024    2:25 PM 06/28/2023    1:44 PM 06/28/2023    1:35 PM 06/24/2023    1:09 PM 03/25/2023    1:00 PM 03/14/2023    2:57 PM  Advanced Directives  Does Patient Have a Medical Advance Directive? Yes Yes Yes Yes Yes Yes Yes  Type of Estate agent of [place];Living will Healthcare Power of [place];Living will Healthcare Power of [place];Living will Healthcare Power of [place];Living will  Healthcare Power of [place];Living will Healthcare Power of [place];Living will  Does patient want to make changes to medical advance directive? No - Patient declined  No - Patient declined No - Patient declined     Copy of Healthcare Power of Attorney in Chart? Yes - validated most recent copy scanned in chart (See row information) Yes - validated most recent copy scanned in chart (See row information) Yes - validated most recent copy scanned in chart (See row information) Yes - validated most recent copy scanned in chart (See row information) Yes - validated most recent copy scanned in chart (See row information) Yes - validated most recent copy scanned in chart (See row information) Yes - validated most recent copy scanned in chart (See row information)  Would patient like information on creating a medical advance directive?  No - Patient declined No - Patient declined    Current Medications (verified) Outpatient Encounter Medications as of 07/02/2024  Medication Sig   amiodarone  (PACERONE ) 200 MG tablet TAKE ONE HALF (1/2) TABLET BY MOUTH DAILY   apixaban  (ELIQUIS ) 5 MG TABS tablet Take 1 tablet (5 mg total) by mouth 2 (two)  times daily.   clopidogrel  (PLAVIX ) 75 MG tablet Take 75 mg by mouth daily.   diltiazem  (CARDIZEM  CD) 120 MG 24 hr capsule TAKE ONE CAPSULE BY MOUTH ONCE DAILY   famotidine  (PEPCID ) 20 MG tablet Take 1 tablet (20 mg total) by mouth daily as needed for heartburn or indigestion.   furosemide  (LASIX ) 20 MG tablet TAKE ONE TABLET BY MOUTH TWICE A DAY   gabapentin  (NEURONTIN ) 100 MG capsule TAKE 1 CAPSULE BY MOUTH 3 TIMES DAILY ASNEEDED   Iron , Ferrous Sulfate , 325 (65 Fe) MG TABS Take 325 mg by mouth every Monday, Wednesday, and Friday.   levothyroxine  (SYNTHROID ) 100 MCG tablet TAKE ONE TAB BY MOUTH ONCE DAILY. TAKE ON AN EMPTY STOMACH WITH A GLASS OF WATER ATLEAST 30-60 MINUTES BEFORE BREAKFAST   metoprolol  succinate (TOPROL -XL) 50 MG 24 hr tablet TAKE ONE TABLET BY MOUTH ONCE A DAY   nitroGLYCERIN  (NITROSTAT ) 0.4 MG SL tablet Place 1 tablet (0.4 mg total) under the tongue every 5 (five) minutes as needed for chest pain.   pantoprazole  (PROTONIX ) 20 MG tablet TAKE ONE TABLET BY MOUTH TWICE (2) DAILY BEFORE A MEAL   Polyethyl Glycol-Propyl Glycol (SYSTANE) 0.4-0.3 % GEL ophthalmic gel Place 1 application into both eyes.   potassium chloride  (KLOR-CON ) 10 MEQ tablet TAKE ONE TABLET BY MOUTH ONCE A DAY   spironolactone  (ALDACTONE ) 25 MG tablet TAKE ONE TABLET BY MOUTH DAILY   traZODone  (DESYREL ) 50 MG tablet TAKE ONE HALF (1/2) TO ONE TABLET BY MOUTH AT BEDTIME AS NEEDED FOR SLEEP.   No facility-administered encounter medications on file as of 07/02/2024.    Allergies (verified) Statins, Vitamin k  and related, Wheat, Ezetimibe , Jardiance [empagliflozin], Alcohol , Amlodipine, Eucalyptus oil, Lisinopril, and Rice   History: Past Medical History:  Diagnosis Date   CAD (coronary artery disease)    a. 04/2022 Cor CTA: sev mid-dist LAD dzs; b. 04/2022 PCI: LM nl, LAD 15m (3.0x26 onyx Frontier DES), 58m/d, LCX nl, OM1 small, OM2 99 small, RCA mild diff dzs.   Chronic heart failure with preserved ejection  fraction (HFpEF) (HCC)    a. 01/2022 Echo: EF 65-70%, no rwma, mild LVH, GrII DD, nl RV fxn, mildly dil LA, mild MR, ao scelrosis.   Fall    GERD (gastroesophageal reflux disease)    History of chicken pox    Hx of colonic polyps    Hyperlipidemia    Hypertension    Hypothyroidism    Keratopathy 2018   R eye   LVH (left ventricular hypertrophy)    Persistent atrial fibrillation (HCC)    a. Initially dx in 2012; b. 01/2022 recurrent AF rvr noted-->warfarin added (CHA2DS2VASc = 5); c. 03/2022 Amio added-->DCCV x 1 (150J); d. 12/2023 Zio: Predominantly sinus rhythm, average 54 (43-136).  4 runs of SVT, longest 7 beats.  Rare PACs and PVCs.  No sustained arrhythmias.   Past Surgical History:  Procedure Laterality Date   CARDIOVASCULAR STRESS TEST  2011   normal    CARDIOVERSION N/A 04/02/2022   Procedure: CARDIOVERSION;  Surgeon: Perla Evalene PARAS, MD;  Location: [facility] ORS;  Service: Cardiovascular;  Laterality: N/A;   COLONOSCOPY WITH PROPOFOL  N/A 11/16/2022  Procedure: COLONOSCOPY WITH PROPOFOL ;  Surgeon: Maryruth Ole DASEN, MD;  Location: [facility] ENDOSCOPY;  Service: Endoscopy;  Laterality: N/A;   CORONARY STENT INTERVENTION N/A 05/06/2022   Procedure: CORONARY STENT INTERVENTION;  Surgeon: Darron Deatrice LABOR, MD;  Location: [facility] INVASIVE CV LAB;  Service: Cardiovascular;  Laterality: N/A;   ESOPHAGOGASTRODUODENOSCOPY (EGD) WITH PROPOFOL  N/A 11/14/2022   Procedure: ESOPHAGOGASTRODUODENOSCOPY (EGD) WITH PROPOFOL ;  Surgeon: Maryruth Ole DASEN, MD;  Location: [facility] ENDOSCOPY;  Service: Endoscopy;  Laterality: N/A;   keratopathy     LEFT HEART CATH AND CORONARY ANGIOGRAPHY N/A 05/06/2022   Procedure: LEFT HEART CATH AND CORONARY ANGIOGRAPHY;  Surgeon: Darron Deatrice LABOR, MD;  Location: [facility] INVASIVE CV LAB;  Service: Cardiovascular;  Laterality: N/A;   Family History  Problem Relation Age of Onset   Heart disease Mother    Heart disease Father    Breast cancer Sister    Breast cancer Daughter     Colon cancer Neg Hx    Social History   Socioeconomic History   Marital status: Widowed    Spouse name: Not on file   Number of children: Not on file   Years of education: Not on file   Highest education level: Not on file  Occupational History   Occupation: Retired Conservation officer, historic buildings: RETIRED  Tobacco Use   Smoking status: Former    Current packs/day: 0.00    Average packs/day: 2.0 packs/day for 20.0 years (40.0 ttl pk-yrs)    Types: Cigarettes    Start date: 12/14/1951    Quit date: 12/14/1971    Years since quitting: 52.5   Smokeless tobacco: Never  Vaping Use   Vaping status: Never Used  Substance and Sexual Activity   Alcohol  use: Not Currently   Drug use: No   Sexual activity: Not Currently  Other Topics Concern   Not on file  Social History Narrative   Divorced   Education:  [employer]   8 pregnancies, 6 live births   LMP: 1985   Lives in independent living.     Daughter died 08/09/2022   Social Drivers of Health   Financial Resource Strain: Low Risk  (07/02/2024)   Overall Financial Resource Strain (CARDIA)    Difficulty of Paying Living Expenses: Not very hard  Food Insecurity: No Food Insecurity (07/02/2024)   Hunger Vital Sign    Worried About Running Out of Food in the Last Year: Never true    Ran Out of Food in the Last Year: Never true  Transportation Needs: No Transportation Needs (07/02/2024)   PRAPARE - Administrator, Civil Service (Medical): No    Lack of Transportation (Non-Medical): No  Physical Activity: Inactive (07/02/2024)   Exercise Vital Sign    Days of Exercise per Week: 0 days    Minutes of Exercise per Session: 0 min  Stress: No Stress Concern Present (07/02/2024)   [employer] of Occupational Health - Occupational Stress Questionnaire    Feeling of Stress: Only a little  Social Connections: Moderately Isolated (07/02/2024)   Social Connection and Isolation Panel    Frequency of Communication with Friends and  Family: Three times a week    Frequency of Social Gatherings with Friends and Family: Once a week    Attends Religious Services: Never    Database administrator or Organizations: Yes    Attends Banker Meetings: 1 to 4 times per year    Marital Status: Widowed    Tobacco Counseling  Counseling given: Not Answered    Clinical Intake:  Pre-visit preparation completed: Yes  Pain : No/denies pain     BMI - recorded: 41.57 Nutritional Status: BMI > 30  Obese Nutritional Risks: None Diabetes: No  No results found for: HGBA1C   How often do you need to have someone help you when you read instructions, pamphlets, or other written materials from your doctor or pharmacy?: 1 - Never What is the last grade level you completed in school?: Associate Degree  Interpreter Needed?: No  Information entered by :: Lyle Anderson LATHER   Activities of Daily Living     07/02/2024    1:26 PM  In your present state of health, do you have any difficulty performing the following activities:  Hearing? 0  Vision? 0  Difficulty concentrating or making decisions? 0  Walking or climbing stairs? 0  Dressing or bathing? 0  Doing errands, shopping? 0  Preparing Food and eating ? N  Using the Toilet? N  In the past six months, have you accidently leaked urine? N  Do you have problems with loss of bowel control? N  Managing your Medications? N  Managing your Finances? Y  Housekeeping or managing your Housekeeping? N    Patient Care Team: Cleatus Arlyss RAMAN, MD as PCP - General (Family Medicine) Perla Evalene PARAS, MD as PCP - Cardiology (Cardiology) Perla Evalene PARAS, MD as Consulting Physician (Cardiology) Luke Larve, MD as Consulting Physician (Ophthalmology) Lenn Standing, MD as Consulting Physician (Ophthalmology) Fate Morna SAILOR, [facility] (Inactive) as Pharmacist (Pharmacist) Jacobo Evalene PARAS, MD as Consulting Physician (Oncology)  I have updated your Care Teams any  recent Medical Services you may have received from other providers in the past year.     Assessment:   This is a routine wellness examination for Julie Phillips.  Hearing/Vision screen Hearing Screening - Comments:: Patient wears hearing aids Vision Screening - Comments:: Patient wears glasses    Goals Addressed             This Visit's Progress    Manage My Medicine   On track    Timeframe:  Long-Range Goal Priority:  Medium Start Date:     01/15/22                        Expected End Date:   01/15/23                    Follow Up Date Aug 2023   - call for medicine refill 2 or 3 days before it runs out - call if I am sick and can't take my medicine - keep a list of all the medicines I take; vitamins and herbals too - use a pillbox to sort medicine    Why is this important?   These steps will help you keep on track with your medicines.   Notes:        Depression Screen     07/02/2024    1:29 PM 06/28/2023    1:42 PM 05/03/2023    8:49 AM 06/24/2022   12:06 PM 04/15/2022   10:53 AM 07/09/2019   11:23 AM 06/30/2018   11:23 AM  PHQ 2/9 Scores  PHQ - 2 Score 0 0 0 0 0 0 0  PHQ- 9 Score 0 0 0   0 0    Fall Risk     07/02/2024    1:23 PM 06/28/2023    1:40 PM 05/03/2023  8:49 AM 06/24/2022   12:06 PM 06/22/2022    6:42 PM  Fall Risk   Falls in the past year? 1 0 1 0 0  Number falls in past yr: 1 0 0 0 0  Injury with Fall? 1 0 0 0 0  Risk for fall due to : History of fall(s) No Fall Risks Impaired balance/gait Medication side effect   Follow up Falls evaluation completed Falls prevention discussed Falls evaluation completed Falls evaluation completed;Education provided;Falls prevention discussed       Data saved with a previous flowsheet row definition    MEDICARE RISK AT HOME:  Medicare Risk at Home Any stairs in or around the home?: Yes If so, are there any without handrails?: No Home free of loose throw rugs in walkways, pet beds, electrical cords, etc?: Yes Adequate  lighting in your home to reduce risk of falls?: Yes Life alert?: No Use of a cane, walker or w/c?: Yes Grab bars in the bathroom?: Yes Shower chair or bench in shower?: Yes Elevated toilet seat or a handicapped toilet?: Yes  TIMED UP AND GO:  Was the test performed?  No  Cognitive Function: 6CIT completed    07/09/2019   11:32 AM 06/30/2018   11:23 AM 06/22/2017    9:18 AM 06/17/2016   10:18 AM  MMSE - Mini Mental State Exam  Orientation to time 5 5 5  5    Orientation to Place 5 5 5  5    Registration 3 3 3  3    Attention/ Calculation 5 0 0  0   Recall 3 2 3  3    Recall-comments  unable to recall 1 of 3 words    Language- name 2 objects 0 0 0  0   Language- repeat 1 1 1 1   Language- follow 3 step command 0 3 3  3    Language- read & follow direction 0 0 0  0   Write a sentence 0 0 0  0   Copy design 0 0 0  0   Total score 22 19 20  20       Data saved with a previous flowsheet row definition        07/02/2024    1:21 PM 06/28/2023    1:36 PM 06/24/2022   12:08 PM  6CIT Screen  What Year? 0 points 0 points 0 points  What month? 0 points 0 points 0 points  What time? 0 points 0 points 0 points  Count back from 20 0 points 0 points 0 points  Months in reverse 0 points 0 points 0 points  Repeat phrase 0 points 0 points 0 points  Total Score 0 points 0 points 0 points    Immunizations Immunization History  Administered Date(s) Administered   Fluad Quad(high Dose 65+) 09/11/2019, 11/09/2019, 10/19/2021, 10/01/2022   Fluzone Influenza virus vaccine,trivalent (IIV3), split virus 10/31/2018   Influenza Split 10/04/2012   Influenza, High Dose Seasonal PF 10/19/2021, 09/23/2023   Influenza, Seasonal, Injecte, Preservative Fre 09/19/2009, 09/10/2010, 09/07/2011   Influenza,inj,Quad PF,6+ Mos 10/08/2013, 09/25/2015, 09/14/2016, 10/06/2017   Influenza-Unspecified 09/13/2011, 08/13/2014, 10/13/2018   PFIZER Comirnaty(Gray Top)Covid-19 Tri-Sucrose Vaccine 04/18/2021   PFIZER(Purple  Top)SARS-COV-2 Vaccination 01/03/2020, 01/21/2020, 09/11/2020, 04/18/2021   Pneumococcal Conjugate-13 08/13/2014   Pneumococcal Polysaccharide-23 12/13/2010   Td 12/13/2010   Td (Adult),5 Lf Tetanus Toxid, Preservative Free 12/13/2010   Zoster, Live 04/02/2011    Screening Tests Health Maintenance  Topic Date Due   Zoster Vaccines- Shingrix (1 of 2)  10/09/1984   DTaP/Tdap/Td (3 - Tdap) 12/13/2020   COVID-19 Vaccine (6 - 2024-25 season) 08/14/2023   INFLUENZA VACCINE  07/13/2024   Medicare Annual Wellness (AWV)  07/02/2025   Pneumococcal Vaccine: 50+ Years  Completed   DEXA SCAN  Completed   Hepatitis B Vaccines  Aged Out   HPV VACCINES  Aged Out   Meningococcal B Vaccine  Aged Out    Health Maintenance  Health Maintenance Due  Topic Date Due   Zoster Vaccines- Shingrix (1 of 2) 10/09/1984   DTaP/Tdap/Td (3 - Tdap) 12/13/2020   COVID-19 Vaccine (6 - 2024-25 season) 08/14/2023   Health Maintenance Items Addressed:declined vaccinations  Additional Screening:  Vision Screening: Recommended annual ophthalmology exams for early detection of glaucoma and other disorders of the eye. Would you like a referral to an eye doctor? No    Dental Screening: Recommended annual dental exams for proper oral hygiene  Community Resource Referral / Chronic Care Management: CRR required this visit?  No   CCM required this visit?  No   Plan:    I have personally reviewed and noted the following in the patient's chart:   Medical and social history Use of alcohol , tobacco or illicit drugs  Current medications and supplements including opioid prescriptions. Patient is not currently taking opioid prescriptions. Functional ability and status Nutritional status Physical activity Advanced directives List of other physicians Hospitalizations, surgeries, and ER visits in previous 12 months Vitals Screenings to include cognitive, depression, and falls Referrals and appointments  In  addition, I have reviewed and discussed with patient certain preventive protocols, quality metrics, and best practice recommendations. A written personalized care plan for preventive services as well as general preventive health recommendations were provided to patient.   Lyle MARLA Right, [place]   07/02/2024   After Visit Summary: (MyChart) Due to this being a telephonic visit, the after visit summary with patients personalized plan was offered to patient via MyChart   Notes: Nothing significant to report at this time.

## 2024-07-02 NOTE — Patient Instructions (Signed)
 [name] , Thank you for taking time out of your busy schedule to complete your Annual Wellness Visit with me. I enjoyed our conversation and look forward to speaking with you again next year. I, as well as your care team,  appreciate your ongoing commitment to your health goals. Please review the following plan we discussed and let me know if I can assist you in the future. Your Game plan/ To Do List    Referrals: If you haven't heard from the office you've been referred to, please reach out to them at the phone provided.  none Follow up Visits: Next Medicare AWV with our clinical staff: 07/03/2025   Have you seen your provider in the last 6 months (3 months if uncontrolled diabetes)? No Next Office Visit with your provider: n/A  Clinician Recommendations:  Aim for 30 minutes of exercise or brisk walking, 6-8 glasses of water, and 5 servings of fruits and vegetables each day.       This is a list of the screening recommended for you and due dates:  Health Maintenance  Topic Date Due   Zoster (Shingles) Vaccine (1 of 2) 10/09/1984   DTaP/Tdap/Td vaccine (3 - Tdap) 12/13/2020   COVID-19 Vaccine (6 - 2024-25 season) 08/14/2023   Flu Shot  07/13/2024   Medicare Annual Wellness Visit  07/02/2025   Pneumococcal Vaccine for age over 18  Completed   DEXA scan (bone density measurement)  Completed   Hepatitis B Vaccine  Aged Out   HPV Vaccine  Aged Out   Meningitis B Vaccine  Aged Out    Advanced directives: (In Chart) A copy of your advanced directives are scanned into your chart should your provider ever need it. Advance Care Planning is important because it:  [x]  Makes sure you receive the medical care that is consistent with your values, goals, and preferences  [x]  It provides guidance to your family and loved ones and reduces their decisional burden about whether or not they are making the right decisions based on your wishes.  Follow the link provided in your after visit summary  or read over the paperwork we have mailed to you to help you started getting your Advance Directives in place. If you need assistance in completing these, please reach out to us  so that we can help you!  See attachments for Preventive Care and Fall Prevention Tips.

## 2024-07-11 ENCOUNTER — Other Ambulatory Visit: Payer: Self-pay

## 2024-07-11 DIAGNOSIS — D631 Anemia in chronic kidney disease: Secondary | ICD-10-CM

## 2024-07-11 DIAGNOSIS — D509 Iron deficiency anemia, unspecified: Secondary | ICD-10-CM

## 2024-07-12 ENCOUNTER — Inpatient Hospital Stay: Payer: PPO | Attending: Oncology

## 2024-07-12 ENCOUNTER — Inpatient Hospital Stay (HOSPITAL_BASED_OUTPATIENT_CLINIC_OR_DEPARTMENT_OTHER): Payer: PPO | Admitting: Oncology

## 2024-07-12 ENCOUNTER — Encounter: Payer: Self-pay | Admitting: Oncology

## 2024-07-12 VITALS — BP 133/56 | HR 59 | Temp 97.6°F | Resp 18 | Ht 61.0 in | Wt 222.0 lb

## 2024-07-12 DIAGNOSIS — R5382 Chronic fatigue, unspecified: Secondary | ICD-10-CM | POA: Diagnosis not present

## 2024-07-12 DIAGNOSIS — D509 Iron deficiency anemia, unspecified: Secondary | ICD-10-CM

## 2024-07-12 DIAGNOSIS — D631 Anemia in chronic kidney disease: Secondary | ICD-10-CM

## 2024-07-12 DIAGNOSIS — N183 Chronic kidney disease, stage 3 unspecified: Secondary | ICD-10-CM | POA: Diagnosis not present

## 2024-07-12 DIAGNOSIS — N289 Disorder of kidney and ureter, unspecified: Secondary | ICD-10-CM | POA: Insufficient documentation

## 2024-07-12 DIAGNOSIS — Z862 Personal history of diseases of the blood and blood-forming organs and certain disorders involving the immune mechanism: Secondary | ICD-10-CM | POA: Insufficient documentation

## 2024-07-12 LAB — CBC WITH DIFFERENTIAL (CANCER CENTER ONLY)
Abs Immature Granulocytes: 0.02 K/uL (ref 0.00–0.07)
Basophils Absolute: 0.1 K/uL (ref 0.0–0.1)
Basophils Relative: 1 %
Eosinophils Absolute: 0.3 K/uL (ref 0.0–0.5)
Eosinophils Relative: 3 %
HCT: 39.8 % (ref 36.0–46.0)
Hemoglobin: 13.3 g/dL (ref 12.0–15.0)
Immature Granulocytes: 0 %
Lymphocytes Relative: 14 %
Lymphs Abs: 1.5 K/uL (ref 0.7–4.0)
MCH: 31 pg (ref 26.0–34.0)
MCHC: 33.4 g/dL (ref 30.0–36.0)
MCV: 92.8 fL (ref 80.0–100.0)
Monocytes Absolute: 0.8 K/uL (ref 0.1–1.0)
Monocytes Relative: 8 %
Neutro Abs: 7.6 K/uL (ref 1.7–7.7)
Neutrophils Relative %: 74 %
Platelet Count: 293 K/uL (ref 150–400)
RBC: 4.29 MIL/uL (ref 3.87–5.11)
RDW: 13.5 % (ref 11.5–15.5)
WBC Count: 10.3 K/uL (ref 4.0–10.5)
nRBC: 0 % (ref 0.0–0.2)

## 2024-07-12 LAB — IRON AND TIBC
Iron: 98 ug/dL (ref 28–170)
Saturation Ratios: 31 % (ref 10.4–31.8)
TIBC: 315 ug/dL (ref 250–450)
UIBC: 217 ug/dL

## 2024-07-12 LAB — FERRITIN: Ferritin: 180 ng/mL (ref 11–307)

## 2024-07-12 NOTE — Progress Notes (Unsigned)
 [facility]  [phone] [phone]  ID: Julie Phillips OB: Nov 24, 1932  MR#: [id number]  RDW#:[id number]  Patient Care Team: Cleatus Arlyss RAMAN, MD as PCP - General (Family Medicine) Perla Evalene PARAS, MD as PCP - Cardiology (Cardiology) Perla Evalene PARAS, MD as Consulting Physician (Cardiology) Luke Larve, MD as Consulting Physician (Ophthalmology) Lenn Standing, MD as Consulting Physician (Ophthalmology) Fate Morna SAILOR, [facility] (Inactive) as Pharmacist (Pharmacist) Jacobo Evalene PARAS, MD as Consulting Physician (Oncology)  CHIEF COMPLAINT: Anemia of chronic renal insufficiency.  INTERVAL HISTORY: Patient returns to clinic today for repeat laboratory work and routine 54-month evaluation.  Patient continues to have chronic fatigue, but otherwise feels well.  She has no neurologic complaints.  She denies any recent fevers or illnesses.  She has a good appetite and denies weight loss.  She has no chest pain, shortness of breath, cough, or hemoptysis.  She denies any nausea, vomiting, constipation, or diarrhea.  She has no melena or hematochezia.  She has no urinary complaints.  Patient offers no further specific complaints today.  REVIEW OF SYSTEMS:   Review of Systems  Constitutional:  Positive for malaise/fatigue. Negative for fever and weight loss.  Respiratory: Negative.  Negative for cough, hemoptysis and shortness of breath.   Cardiovascular: Negative.  Negative for chest pain and leg swelling.  Gastrointestinal: Negative.  Negative for abdominal pain, blood in stool and melena.  Genitourinary: Negative.  Negative for dysuria.  Musculoskeletal: Negative.  Negative for back pain.  Skin: Negative.  Negative for rash.  Neurological: Negative.  Negative for dizziness, focal weakness, weakness and headaches.  Psychiatric/Behavioral: Negative.  The patient is not nervous/anxious.     As per HPI. Otherwise, a complete review of systems is  negative.  PAST MEDICAL HISTORY: Past Medical History:  Diagnosis Date   CAD (coronary artery disease)    a. 04/2022 Cor CTA: sev mid-dist LAD dzs; b. 04/2022 PCI: LM nl, LAD 79m (3.0x26 onyx Frontier DES), 74m/d, LCX nl, OM1 small, OM2 99 small, RCA mild diff dzs.   Chronic heart failure with preserved ejection fraction (HFpEF) (HCC)    a. 01/2022 Echo: EF 65-70%, no rwma, mild LVH, GrII DD, nl RV fxn, mildly dil LA, mild MR, ao scelrosis.   Fall    GERD (gastroesophageal reflux disease)    History of chicken pox    Hx of colonic polyps    Hyperlipidemia    Hypertension    Hypothyroidism    Keratopathy 2018   R eye   LVH (left ventricular hypertrophy)    Persistent atrial fibrillation (HCC)    a. Initially dx in 2012; b. 01/2022 recurrent AF rvr noted-->warfarin added (CHA2DS2VASc = 5); c. 03/2022 Amio added-->DCCV x 1 (150J); d. 12/2023 Zio: Predominantly sinus rhythm, average 54 (43-136).  4 runs of SVT, longest 7 beats.  Rare PACs and PVCs.  No sustained arrhythmias.    PAST SURGICAL HISTORY: Past Surgical History:  Procedure Laterality Date   CARDIOVASCULAR STRESS TEST  2011   normal    CARDIOVERSION N/A 04/02/2022   Procedure: CARDIOVERSION;  Surgeon: Perla Evalene PARAS, MD;  Location: [facility] ORS;  Service: Cardiovascular;  Laterality: N/A;   COLONOSCOPY WITH PROPOFOL  N/A 11/16/2022   Procedure: COLONOSCOPY WITH PROPOFOL ;  Surgeon: Maryruth Ole DASEN, MD;  Location: [facility] ENDOSCOPY;  Service: Endoscopy;  Laterality: N/A;   CORONARY STENT INTERVENTION N/A 05/06/2022   Procedure: CORONARY STENT INTERVENTION;  Surgeon: Darron Deatrice LABOR, MD;  Location: [facility] INVASIVE CV LAB;  Service: Cardiovascular;  Laterality: N/A;   ESOPHAGOGASTRODUODENOSCOPY (EGD) WITH PROPOFOL  N/A 11/14/2022   Procedure: ESOPHAGOGASTRODUODENOSCOPY (EGD) WITH PROPOFOL ;  Surgeon: Maryruth Ole DASEN, MD;  Location: [facility] ENDOSCOPY;  Service: Endoscopy;  Laterality: N/A;   keratopathy     LEFT HEART CATH AND CORONARY  ANGIOGRAPHY N/A 05/06/2022   Procedure: LEFT HEART CATH AND CORONARY ANGIOGRAPHY;  Surgeon: Darron Deatrice LABOR, MD;  Location: [facility] INVASIVE CV LAB;  Service: Cardiovascular;  Laterality: N/A;    FAMILY HISTORY: Family History  Problem Relation Age of Onset   Heart disease Mother    Heart disease Father    Breast cancer Sister    Breast cancer Daughter    Colon cancer Neg Hx     ADVANCED DIRECTIVES (Y/N):  N  HEALTH MAINTENANCE: Social History   Tobacco Use   Smoking status: Former    Current packs/day: 0.00    Average packs/day: 2.0 packs/day for 20.0 years (40.0 ttl pk-yrs)    Types: Cigarettes    Start date: 12/14/1951    Quit date: 12/14/1971    Years since quitting: 52.6   Smokeless tobacco: Never  Vaping Use   Vaping status: Never Used  Substance Use Topics   Alcohol  use: Not Currently   Drug use: No     Colonoscopy:  PAP:  Bone density:  Lipid panel:  Allergies  Allergen Reactions   Statins     Myalgias   Vitamin K  And Related Shortness Of Breath   Wheat     Throat swelling   Ezetimibe  Other (See Comments)    Extreme fatigue   Jardiance [Empagliflozin] Other (See Comments)    Dry mouth, gasping for breath   Alcohol      Other Reaction(s): Other (See Comments)  Reaction to hand sanitizer with fragrance. Wheezing, trouble breathing   Amlodipine     hematuria   Eucalyptus Oil    Lisinopril     Unrecalled BP med- possibly lisinopril- intolerant, had cough on the medicine- changed to cozaar    Rice Nausea And Vomiting    Current Outpatient Medications  Medication Sig Dispense Refill   amiodarone  (PACERONE ) 200 MG tablet TAKE ONE HALF (1/2) TABLET BY MOUTH DAILY 45 tablet 2   apixaban  (ELIQUIS ) 5 MG TABS tablet Take 1 tablet (5 mg total) by mouth 2 (two) times daily. 60 tablet 6   clopidogrel  (PLAVIX ) 75 MG tablet Take 75 mg by mouth daily.     diltiazem  (CARDIZEM  CD) 120 MG 24 hr capsule TAKE ONE CAPSULE BY MOUTH ONCE DAILY 90 capsule 3   famotidine   (PEPCID ) 20 MG tablet Take 1 tablet (20 mg total) by mouth daily as needed for heartburn or indigestion. 30 tablet 1   furosemide  (LASIX ) 20 MG tablet TAKE ONE TABLET BY MOUTH TWICE A DAY 180 tablet 3   gabapentin  (NEURONTIN ) 100 MG capsule TAKE 1 CAPSULE BY MOUTH 3 TIMES DAILY ASNEEDED 270 capsule 3   Iron , Ferrous Sulfate , 325 (65 Fe) MG TABS Take 325 mg by mouth every Monday, Wednesday, and Friday.     levothyroxine  (SYNTHROID ) 100 MCG tablet TAKE ONE TAB BY MOUTH ONCE DAILY. TAKE ON AN EMPTY STOMACH WITH A GLASS OF WATER ATLEAST 30-60 MINUTES BEFORE BREAKFAST 90 tablet 1   metoprolol  succinate (TOPROL -XL) 50 MG 24 hr tablet TAKE ONE TABLET BY MOUTH ONCE A DAY 90 tablet 1   nitroGLYCERIN  (NITROSTAT ) 0.4 MG SL tablet Place 1 tablet (0.4 mg total) under the tongue every 5 (five) minutes as needed for chest pain. 25 tablet 3  pantoprazole  (PROTONIX ) 20 MG tablet TAKE ONE TABLET BY MOUTH TWICE (2) DAILY BEFORE A MEAL 180 tablet 2   Polyethyl Glycol-Propyl Glycol (SYSTANE) 0.4-0.3 % GEL ophthalmic gel Place 1 application into both eyes.     potassium chloride  (KLOR-CON ) 10 MEQ tablet TAKE ONE TABLET BY MOUTH ONCE A DAY 90 tablet 1   spironolactone  (ALDACTONE ) 25 MG tablet TAKE ONE TABLET BY MOUTH DAILY 90 tablet 3   traZODone  (DESYREL ) 50 MG tablet TAKE ONE HALF (1/2) TO ONE TABLET BY MOUTH AT BEDTIME AS NEEDED FOR SLEEP. 30 tablet 2   No current facility-administered medications for this visit.    OBJECTIVE: Vitals:   07/12/24 1434  BP: (!) 133/56  Pulse: (!) 59  Resp: 18  Temp: 97.6 F (36.4 C)  SpO2: 96%     Body mass index is 41.95 kg/m.    ECOG FS:1 - Symptomatic but completely ambulatory  General: Well-developed, well-nourished, no acute distress. Eyes: Pink conjunctiva, anicteric sclera. HEENT: Normocephalic, moist mucous membranes. Lungs: No audible wheezing or coughing. Heart: Regular rate and rhythm. Abdomen: Soft, nontender, no obvious distention. Musculoskeletal: No  edema, cyanosis, or clubbing. Neuro: Alert, answering all questions appropriately. Cranial nerves grossly intact. Skin: No rashes or petechiae noted. Psych: Normal affect.  LAB RESULTS:  Lab Results  Component Value Date   NA 141 06/01/2024   K 4.3 06/01/2024   CL 102 06/01/2024   CO2 21 06/01/2024   GLUCOSE 89 06/01/2024   BUN 22 06/01/2024   CREATININE 1.15 (H) 06/01/2024   CALCIUM 9.4 06/01/2024   PROT 6.6 08/11/2023   ALBUMIN 3.8 08/11/2023   AST 14 08/11/2023   ALT 8 08/11/2023   ALKPHOS 88 08/11/2023   BILITOT 0.4 08/11/2023   GFRNONAA 60 (L) 11/16/2022   GFRAA 90 03/25/2020    Lab Results  Component Value Date   WBC 10.3 07/12/2024   NEUTROABS 7.6 07/12/2024   HGB 13.3 07/12/2024   HCT 39.8 07/12/2024   MCV 92.8 07/12/2024   PLT 293 07/12/2024   Lab Results  Component Value Date   IRON  98 07/12/2024   TIBC 315 07/12/2024   IRONPCTSAT 31 07/12/2024   Lab Results  Component Value Date   FERRITIN 180 07/12/2024     STUDIES: No results found.  ASSESSMENT: Anemia of chronic renal insufficiency.  PLAN:    Anemia of chronic renal insufficiency: Resolved.  Patient's hemoglobin continues to be within normal limits at 13.3 today.  She last received Aranesp  on March 03, 2023.  No intervention is needed.  After discussion with the patient, is agreed upon that no further follow-up is necessary.  Please refer patient back if there are any questions or concerns. History of iron  deficiency anemia: Resolved.  Patient's hemoglobin and iron  stores continue to be within normal limits.  She does not require additional IV Venofer  today.  Patient last received treatment on March 18, 2023.  No follow-up necessary as above. Renal insufficiency: Mild.  Patient's most recent creatinine is 1.15. Fatigue: Likely multifactorial.    Patient expressed understanding and was in agreement with this plan. She also understands that She can call clinic at any time with any questions,  concerns, or complaints.    Cancer Staging  No matching staging information was found for the patient.   Evalene JINNY Reusing, MD   07/13/2024 12:47 PM

## 2024-07-12 NOTE — Progress Notes (Unsigned)
 Severely fatigued.

## 2024-07-13 ENCOUNTER — Encounter: Payer: Self-pay | Admitting: Internal Medicine

## 2024-07-19 DIAGNOSIS — H542X21 Low vision right eye category 2, low vision left eye category 1: Secondary | ICD-10-CM | POA: Diagnosis not present

## 2024-07-19 DIAGNOSIS — H548 Legal blindness, as defined in USA: Secondary | ICD-10-CM | POA: Diagnosis not present

## 2024-07-19 DIAGNOSIS — H353221 Exudative age-related macular degeneration, left eye, with active choroidal neovascularization: Secondary | ICD-10-CM | POA: Diagnosis not present

## 2024-07-19 DIAGNOSIS — H18421 Band keratopathy, right eye: Secondary | ICD-10-CM | POA: Diagnosis not present

## 2024-07-19 DIAGNOSIS — H53413 Scotoma involving central area, bilateral: Secondary | ICD-10-CM | POA: Diagnosis not present

## 2024-07-19 DIAGNOSIS — H179 Unspecified corneal scar and opacity: Secondary | ICD-10-CM | POA: Diagnosis not present

## 2024-07-19 DIAGNOSIS — H5372 Impaired contrast sensitivity: Secondary | ICD-10-CM | POA: Diagnosis not present

## 2024-07-23 DIAGNOSIS — H353221 Exudative age-related macular degeneration, left eye, with active choroidal neovascularization: Secondary | ICD-10-CM | POA: Diagnosis not present

## 2024-07-25 DIAGNOSIS — H18891 Other specified disorders of cornea, right eye: Secondary | ICD-10-CM | POA: Diagnosis not present

## 2024-07-25 DIAGNOSIS — H53413 Scotoma involving central area, bilateral: Secondary | ICD-10-CM | POA: Diagnosis not present

## 2024-07-25 DIAGNOSIS — H353221 Exudative age-related macular degeneration, left eye, with active choroidal neovascularization: Secondary | ICD-10-CM | POA: Diagnosis not present

## 2024-07-25 DIAGNOSIS — H18421 Band keratopathy, right eye: Secondary | ICD-10-CM | POA: Diagnosis not present

## 2024-07-25 DIAGNOSIS — H179 Unspecified corneal scar and opacity: Secondary | ICD-10-CM | POA: Diagnosis not present

## 2024-07-31 DIAGNOSIS — H353221 Exudative age-related macular degeneration, left eye, with active choroidal neovascularization: Secondary | ICD-10-CM | POA: Diagnosis not present

## 2024-07-31 DIAGNOSIS — Z961 Presence of intraocular lens: Secondary | ICD-10-CM | POA: Diagnosis not present

## 2024-07-31 DIAGNOSIS — H18421 Band keratopathy, right eye: Secondary | ICD-10-CM | POA: Diagnosis not present

## 2024-08-10 DIAGNOSIS — H353221 Exudative age-related macular degeneration, left eye, with active choroidal neovascularization: Secondary | ICD-10-CM | POA: Diagnosis not present

## 2024-08-10 DIAGNOSIS — H26492 Other secondary cataract, left eye: Secondary | ICD-10-CM | POA: Diagnosis not present

## 2024-08-10 DIAGNOSIS — H18421 Band keratopathy, right eye: Secondary | ICD-10-CM | POA: Diagnosis not present

## 2024-08-27 ENCOUNTER — Other Ambulatory Visit: Payer: Self-pay | Admitting: Family Medicine

## 2024-08-28 DIAGNOSIS — H353221 Exudative age-related macular degeneration, left eye, with active choroidal neovascularization: Secondary | ICD-10-CM | POA: Diagnosis not present

## 2024-08-30 ENCOUNTER — Ambulatory Visit (INDEPENDENT_AMBULATORY_CARE_PROVIDER_SITE_OTHER): Admitting: Family Medicine

## 2024-08-30 ENCOUNTER — Other Ambulatory Visit: Payer: Self-pay | Admitting: Family Medicine

## 2024-08-30 ENCOUNTER — Encounter: Payer: Self-pay | Admitting: Family Medicine

## 2024-08-30 VITALS — BP 118/64 | HR 57 | Temp 98.2°F | Ht 61.0 in | Wt 224.6 lb

## 2024-08-30 DIAGNOSIS — R42 Dizziness and giddiness: Secondary | ICD-10-CM | POA: Diagnosis not present

## 2024-08-30 DIAGNOSIS — I10 Essential (primary) hypertension: Secondary | ICD-10-CM

## 2024-08-30 DIAGNOSIS — Z66 Do not resuscitate: Secondary | ICD-10-CM | POA: Diagnosis not present

## 2024-08-30 LAB — CBC WITH DIFFERENTIAL/PLATELET
Basophils Absolute: 0.1 K/uL (ref 0.0–0.1)
Basophils Relative: 1.2 % (ref 0.0–3.0)
Eosinophils Absolute: 0.2 K/uL (ref 0.0–0.7)
Eosinophils Relative: 3 % (ref 0.0–5.0)
HCT: 37.4 % (ref 36.0–46.0)
Hemoglobin: 12.5 g/dL (ref 12.0–15.0)
Lymphocytes Relative: 14.4 % (ref 12.0–46.0)
Lymphs Abs: 1.2 K/uL (ref 0.7–4.0)
MCHC: 33.4 g/dL (ref 30.0–36.0)
MCV: 91.5 fl (ref 78.0–100.0)
Monocytes Absolute: 0.6 K/uL (ref 0.1–1.0)
Monocytes Relative: 7.7 % (ref 3.0–12.0)
Neutro Abs: 6 K/uL (ref 1.4–7.7)
Neutrophils Relative %: 73.7 % (ref 43.0–77.0)
Platelets: 299 K/uL (ref 150.0–400.0)
RBC: 4.09 Mil/uL (ref 3.87–5.11)
RDW: 13.8 % (ref 11.5–15.5)
WBC: 8.1 K/uL (ref 4.0–10.5)

## 2024-08-30 LAB — BASIC METABOLIC PANEL WITH GFR
BUN: 29 mg/dL — ABNORMAL HIGH (ref 6–23)
CO2: 26 meq/L (ref 19–32)
Calcium: 9.4 mg/dL (ref 8.4–10.5)
Chloride: 101 meq/L (ref 96–112)
Creatinine, Ser: 1.28 mg/dL — ABNORMAL HIGH (ref 0.40–1.20)
GFR: 37.04 mL/min — ABNORMAL LOW (ref >60.00)
Glucose, Bld: 92 mg/dL (ref 70–99)
Potassium: 4.2 meq/L (ref 3.5–5.1)
Sodium: 135 meq/L (ref 135–145)

## 2024-08-30 MED ORDER — SPIRONOLACTONE 25 MG PO TABS: 25.0000 mg | ORAL_TABLET | Freq: Every day | ORAL | Status: AC

## 2024-08-30 NOTE — Patient Instructions (Signed)
 Hold spironolactone  for now and let me know if you are not feeling better.  Take care.  Glad to see you.

## 2024-08-30 NOTE — Progress Notes (Signed)
 Dizziness versus lightheadedness.  She had episode that went on for a few days that was atypical for length but not for symptoms.  Not taking gabapentin  to cause sx.  She had room spinning, horizontally.  Usually better after laying down and going to sleep.  She can feel lightheaded.    She had some lower BP readings, down to 90s systolic on home check.  No bleeding.  No fevers. No chills.    No heart racing.  No exertional chest pain.    Meds, vitals, and allergies reviewed.   ROS: Per HPI unless specifically indicated in ROS section   Nad Ncat Neck supple, no LA ctab RRR Abdomen soft.  Nontender. Skin well-perfused.  See notes on labs.

## 2024-08-30 NOTE — Assessment & Plan Note (Signed)
Advance directive- daughter Su Grand if patient were incapacitated.

## 2024-08-31 NOTE — Telephone Encounter (Signed)
 Last OV:  08/30/24, DNR; lightheaded Next OV:  none

## 2024-08-31 NOTE — Telephone Encounter (Unsigned)
 Copied from CRM [id number]. Topic: Clinical - Prescription Issue >> Aug 31, 2024 11:27 AM Harlene ORN wrote: Reason for CRM: [facility]  Sent a refill request for Potassium and Metroprolol that was denied, saying the patient must come in first to be seen, but she was just seen 09/18 @ 9am. Please advise. Pharmacy: [phone]

## 2024-09-02 ENCOUNTER — Ambulatory Visit: Payer: Self-pay | Admitting: Family Medicine

## 2024-09-02 NOTE — Assessment & Plan Note (Signed)
 Defer repeat monitor at his point, given relatively recent results, d/w pt.   I question if her symptoms are blood pressure related.  I think it makes sense to hold spironolactone  for now and let me know if she is not feeling better.  She agrees to plan.

## 2024-09-12 DIAGNOSIS — H18421 Band keratopathy, right eye: Secondary | ICD-10-CM | POA: Diagnosis not present

## 2024-09-12 DIAGNOSIS — H353221 Exudative age-related macular degeneration, left eye, with active choroidal neovascularization: Secondary | ICD-10-CM | POA: Diagnosis not present

## 2024-09-25 ENCOUNTER — Other Ambulatory Visit: Payer: Self-pay | Admitting: Cardiology

## 2024-09-26 DIAGNOSIS — H18421 Band keratopathy, right eye: Secondary | ICD-10-CM | POA: Diagnosis not present

## 2024-09-26 DIAGNOSIS — H18891 Other specified disorders of cornea, right eye: Secondary | ICD-10-CM | POA: Diagnosis not present

## 2024-09-26 DIAGNOSIS — H353221 Exudative age-related macular degeneration, left eye, with active choroidal neovascularization: Secondary | ICD-10-CM | POA: Diagnosis not present

## 2024-09-26 DIAGNOSIS — H53413 Scotoma involving central area, bilateral: Secondary | ICD-10-CM | POA: Diagnosis not present

## 2024-09-26 DIAGNOSIS — H179 Unspecified corneal scar and opacity: Secondary | ICD-10-CM | POA: Diagnosis not present

## 2024-10-23 DIAGNOSIS — H353221 Exudative age-related macular degeneration, left eye, with active choroidal neovascularization: Secondary | ICD-10-CM | POA: Diagnosis not present

## 2024-10-23 DIAGNOSIS — H43812 Vitreous degeneration, left eye: Secondary | ICD-10-CM | POA: Diagnosis not present

## 2024-10-23 DIAGNOSIS — Z961 Presence of intraocular lens: Secondary | ICD-10-CM | POA: Diagnosis not present

## 2024-10-23 DIAGNOSIS — H18421 Band keratopathy, right eye: Secondary | ICD-10-CM | POA: Diagnosis not present

## 2024-10-24 ENCOUNTER — Other Ambulatory Visit: Payer: Self-pay | Admitting: Family Medicine

## 2024-10-24 NOTE — Telephone Encounter (Signed)
 Please review refill request

## 2024-11-05 DIAGNOSIS — H353221 Exudative age-related macular degeneration, left eye, with active choroidal neovascularization: Secondary | ICD-10-CM | POA: Diagnosis not present

## 2024-11-20 ENCOUNTER — Other Ambulatory Visit: Payer: Self-pay | Admitting: Cardiovascular Disease

## 2024-11-20 NOTE — Telephone Encounter (Signed)
Please contact pt for future appointment. 

## 2024-11-21 NOTE — Telephone Encounter (Signed)
Scheduled 12/18

## 2024-11-29 ENCOUNTER — Encounter: Payer: Self-pay | Admitting: Cardiology

## 2024-11-29 ENCOUNTER — Ambulatory Visit: Attending: Cardiology | Admitting: Cardiology

## 2024-11-29 VITALS — BP 100/65 | HR 56 | Ht 61.0 in | Wt 220.8 lb

## 2024-11-29 DIAGNOSIS — E782 Mixed hyperlipidemia: Secondary | ICD-10-CM

## 2024-11-29 DIAGNOSIS — I4819 Other persistent atrial fibrillation: Secondary | ICD-10-CM

## 2024-11-29 DIAGNOSIS — Z8719 Personal history of other diseases of the digestive system: Secondary | ICD-10-CM

## 2024-11-29 DIAGNOSIS — G4730 Sleep apnea, unspecified: Secondary | ICD-10-CM | POA: Diagnosis not present

## 2024-11-29 DIAGNOSIS — I25118 Atherosclerotic heart disease of native coronary artery with other forms of angina pectoris: Secondary | ICD-10-CM

## 2024-11-29 DIAGNOSIS — I10 Essential (primary) hypertension: Secondary | ICD-10-CM | POA: Diagnosis not present

## 2024-11-29 MED ORDER — METOPROLOL SUCCINATE ER 25 MG PO TB24: 25.0000 mg | ORAL_TABLET | Freq: Every day | ORAL | 3 refills | Status: AC

## 2024-11-29 NOTE — Patient Instructions (Signed)
 Medication Instructions:  Your physician recommends the following medication changes.  DECREASE: Toprol  XL to 25 mg daily  *If you need a refill on your cardiac medications before your next appointment, please call your pharmacy*  Lab Work: No labs ordered today  If you have labs (blood work) drawn today and your tests are completely normal, you will receive your results only by: MyChart Message (if you have MyChart) OR A paper copy in the mail If you have any lab test that is abnormal or we need to change your treatment, we will call you to review the results.  Testing/Procedures: No test ordered today   Follow-Up: At [facility], you and your health needs are our priority.  As part of our continuing mission to provide you with exceptional heart care, our providers are all part of one team.  This team includes your primary Cardiologist (physician) and Advanced Practice Providers or APPs (Physician Assistants and Nurse Practitioners) who all work together to provide you with the care you need, when you need it.  Your next appointment:   6 month(s)  Provider:   You may see Timothy Gollan, MD or one of the following Advanced Practice Providers on your designated Care Team:   Tylene Lunch, NP

## 2024-11-29 NOTE — Progress Notes (Signed)
 Cardiology Office Note   Date:  11/29/2024  ID:  ARRIAH WADLE, DOB Oct 14, 1932, MRN [id number] PCP: Cleatus Arlyss RAMAN, MD  [facility] Providers Cardiologist:  Evalene Lunger, MD Cardiology APP:  Gerard Frederick, NP     History of Present Illness Julie Phillips is a 89 y.o. female with past medical history of chronic HFpEF, persistent atrial fibrillation on chronic anticoagulation with warfarin, hypertension, hyperlipidemia, hypothyroidism, hearing loss, obesity, who presents today for follow-up.   She was initially diagnosed with atrial fibrillation in the setting of hypokalemia due to cirrhosis.  Echo at that time showed preserved EF with mild MR and TR.  Stress test was nonischemic.  She initially was placed on warfarin.The patient is continued to get frequent falls.  In February 2020 she presented to [facility] for chest pressure with mildly elevated high-sensitivity troponin in the setting of atrial fibrillation with RVR.  Repeat echocardiogram revealed an EF of 65 to 70%, RWMA, mild LVH, G2 DD, mild MR, and aortic valve sclerosis without stenosis.  In April 2023 she was at PCP office with complaints of dizziness found to be in atrial fibrillation with RVR with rates of 140s she was subsequently transferred to [facility] emergency department.  She underwent successful cardioversion/21/23 and her amiodarone  was transitioned to oral dosing and she was discharged to the hospital on 04/22/2022.  Coronary CTA revealed a coronary calcium score of 2 which was in the 87 percentile for age and sex matched control, normal coronary origins with right dominance, calcified plaque causing severe proximal mid LAD stenosis, calcified plaque with moderate proximal left circumflex stenosis with CAD-RADS 4.  She presented for elective heart catheterization on 05/06/2022 where she had successful PCI/DES to the mid LAD.  She was evaluated in clinic 11/12/2021 with complaints of shortness of breath and was sent to the emergency  department for further evaluation and found to have a hemoglobin of 7.6.  Hemoglobin probably a month prior was 11.2.  She was transfused and her warfarin was placed on hold for GI consultation and plan.  EGD was unremarkable and colonoscopy completed on 11/16/2022 which revealed findings of likely the patient having AVMs.  She had previously been seen in clinic 01/03/2023 by Dr. Gollan.  At that time she was having some shortness of breath on exertion that had improved but was denying any chest discomfort or peripheral edema.  Denying significant abdominal swelling.  Preferred to stay on warfarin versus DOAC.   She was last seen in clinic 06/01/2024 frustrated over fluctuating INRs and wanted to discuss changing from warfarin to apixaban .  She was sent for updated labs to evaluate an H&H and renal function.  Provide other labs are stable can consider transitioning to apixaban .   She returns to clinic today stating overall she has been doing well.  She has tolerated the change from warfarin to apixaban  without any issues.  She states that she has not missed any doses.  She denies any bleeding with no blood noted in her urine or stool.  She does state that as of late she believes that she may be slightly overmedicated as she has noticed a drop in her blood pressure and her heart rate that causes extreme fatigue.  She denies any chest pain worsening shortness of breath and occasional swelling to her legs.  States that she has also been compliant with remainder of her medication regimen.  Denies any hospitalizations or visits to the emergency department.  ROS: 10 point review of systems  has been reviewed and considered negative exception was listed in the HPI  Studies Reviewed EKG Interpretation Date/Time:  Thursday November 29 2024 14:55:33 EST Ventricular Rate:  56 PR Interval:  164 QRS Duration:  88 QT Interval:  482 QTC Calculation: 465 R Axis:   25  Text Interpretation: Sinus bradycardia Cannot  rule out Anterior infarct , age undetermined When compared with ECG of 01-Jun-2024 08:03, No significant change was found Confirmed by Gerard Frederick ([id number]) on 11/29/2024 3:01:29 PM    LHC 05/06/22   2nd Mrg lesion is 99% stenosed.   Mid LAD lesion is 90% stenosed.   Mid LAD to Dist LAD lesion is 20% stenosed.   A drug-eluting stent was successfully placed using a STENT ONYX FRONTIER 3.0X26.   Post intervention, there is a 0% residual stenosis.   1.  Severe two-vessel coronary artery disease with 90% stenosis in the mid LAD and subtotal occlusion of a small OM 2.  Codominant coronary arteries.  The coronary arteries are moderately calcified overall. 2.  Mildly elevated left ventricular end-diastolic pressure.  Left ventricular angiography was not performed.  EF was normal by echo. 3.  Successful angioplasty and drug-eluting stent placement to the mid LAD.   Recommendations: Warfarin can be resumed tonight. Treat with aspirin  and clopidogrel  for now.  Once INR is therapeutic, aspirin  can be discontinued to minimize the risk of bleeding. The patient is intolerant to statins.  Consider alternative medications. OM 2 is too small to stent.     Coronary CTA 04/22/22 IMPRESSION: 1. Coronary calcium score of 902. This was 87th percentile for age and sex matched control.   2. Normal coronary origin with right dominance.   3. Calcified plaque causing severe proximal LAD stenosis (>70%).   4. Calcified plaque causing moderate proximal LCx stenosis (50%).   5. CAD-RADS 4 Severe stenosis. (70-99% or > 50% left main). Cardiac catheterization is recommended.   6. Additional analysis with CT FFR will be submitted and reported separately.   TTE 02/10/22 1. Left ventricular ejection fraction, by estimation, is 65 to 70%. The  left ventricle has normal function. The left ventricle has no regional  wall motion abnormalities. There is mild left ventricular hypertrophy.  Left ventricular diastolic  parameters  are consistent with Grade II diastolic dysfunction (pseudonormalization).   2. Right ventricular systolic function is normal. The right ventricular  size is normal.   3. Left atrial size was mildly dilated.   4. The mitral valve is degenerative. Mild mitral valve regurgitation.  Moderate mitral annular calcification.   5. The aortic valve was not well visualized. Aortic valve regurgitation  is not visualized. Aortic valve sclerosis/calcification is present,  without any evidence of aortic stenosis.   6. The inferior vena cava is normal in size with greater than 50%  respiratory variability, suggesting right atrial pressure of 3 mmHg.   Risk Assessment/Calculations  CHA2DS2-VASc Score = 6   This indicates a 9.7% annual risk of stroke. The patient's score is based upon: CHF History: 1 HTN History: 1 Diabetes History: 0 Stroke History: 0 Vascular Disease History: 1 Age Score: 2 Gender Score: 1        STOP-Bang Score:         Physical Exam VS:  BP 100/65 (BP Location: Left Wrist, Patient Position: Sitting, Cuff Size: Normal)   Pulse (!) 56 Comment: 56 oximeter  Ht 5' 1 (1.549 m)   Wt 220 lb 12.8 oz (100.2 kg)   SpO2 96%  BMI 41.72 kg/m        Wt Readings from Last 3 Encounters:  11/29/24 220 lb 12.8 oz (100.2 kg)  08/30/24 224 lb 9.6 oz (101.9 kg)  07/12/24 222 lb (100.7 kg)    GEN: Well nourished, well developed in no acute distress NECK: No JVD; No carotid bruits CARDIAC: RRR, bradycardic, I/VI systolic murmur RUSB, without rubs or gallops RESPIRATORY:  Clear to auscultation without rales, wheezing or rhonchi  ABDOMEN: Soft, non-tender, non-distended EXTREMITIES:  No edema; No deformity   ASSESSMENT AND PLAN Persistent atrial fibrillation status post prior DCCV in 2023.  She has continued to be sinus with sinus bradycardia with rate of 56 without ischemic changes noted on EKG today.  She is continued on amiodarone  100 mg daily, apixaban  5 mg twice daily  for CHA2DS2-VASc score of at least 6 for stroke prophylaxis.  She is also continued on diltiazem  120 mg daily and Toprol -XL is being decreased to 25 mg daily from 50 mg daily due to bradycardia.  Essential hypertension with blood pressure today 100/65.  Blood pressure is little bit on the lower side today.  Toprol -XL is being decreased to 25 mg daily she is continued on diltiazem  furosemide  and spironolactone .  She has been encouraged to continue to monitor pressures 1 to 2 hours postmedication administration at home as well.  Coronary artery disease status post PCI/DES to the LAD in 04/2022.  She has since done well and denies chest pain or changes in dyspnea.  She remains on apixaban  and lieu of aspirin , Toprol -XL.  She is not on statin therapy due to statin and Setia intolerance.  She has tried Repatha  in the past but struggled with self injection.  She would likely be a good candidate for Leqvio but lacks transportation to [place] for infusion clinic.  Will revisit again on return appointment and try to have her set up with the availability in [employer].  Mixed hyperlipidemia with previous intolerance to statin and ezetimibe .  She will need updated lipid panel on return.  Can consider Leqvio and rediscuss on return.  Sleep disordered breathing where she states that she is intolerant to CPAP.  History of GI bleed but denies any reoccurrence of bleeding.  She is continued on apixaban  5 mg twice daily.       Dispo: Patient to return to clinic to see MD/APP in 6 months or sooner if needed for further evaluation.  Signed, Ho Parisi, NP

## 2024-12-12 ENCOUNTER — Other Ambulatory Visit: Payer: Self-pay | Admitting: Family Medicine

## 2024-12-12 ENCOUNTER — Other Ambulatory Visit: Payer: Self-pay | Admitting: Nurse Practitioner

## 2024-12-12 ENCOUNTER — Other Ambulatory Visit: Payer: Self-pay | Admitting: Cardiology

## 2024-12-12 DIAGNOSIS — D509 Iron deficiency anemia, unspecified: Secondary | ICD-10-CM

## 2024-12-12 DIAGNOSIS — E559 Vitamin D deficiency, unspecified: Secondary | ICD-10-CM

## 2024-12-12 DIAGNOSIS — E039 Hypothyroidism, unspecified: Secondary | ICD-10-CM

## 2024-12-12 NOTE — Telephone Encounter (Signed)
 Eliquis  5mg  refill request received. Patient is 89 years old, weight-100.2kg, Crea-1.28 on 08/30/24, Diagnosis-Afib, and last seen by Tylene Lunch on 11/29/24. Dose is appropriate based on dosing criteria. Will send in refill to requested pharmacy.

## 2024-12-14 NOTE — Telephone Encounter (Signed)
 Tsh last checked 08/11/23? Ok to fill?

## 2024-12-16 NOTE — Telephone Encounter (Signed)
 Sent. Thanks.  Please schedule yearly visit for the spring when possible.  We can do labs at the visit.

## 2024-12-19 ENCOUNTER — Other Ambulatory Visit: Payer: Self-pay | Admitting: Cardiology

## 2025-01-15 ENCOUNTER — Other Ambulatory Visit: Payer: Self-pay | Admitting: Cardiology

## 2025-07-03 ENCOUNTER — Ambulatory Visit
